# Patient Record
Sex: Female | Born: 1935 | Race: White | Hispanic: No | Smoking: Never smoker
Health system: Southern US, Community
[De-identification: ages and names within clinical notes are randomized; demographics above are authoritative.]

## PROBLEM LIST (undated history)

## (undated) DIAGNOSIS — I340 Nonrheumatic mitral (valve) insufficiency: Secondary | ICD-10-CM

## (undated) DIAGNOSIS — J45909 Unspecified asthma, uncomplicated: Secondary | ICD-10-CM

## (undated) DIAGNOSIS — E119 Type 2 diabetes mellitus without complications: Secondary | ICD-10-CM

## (undated) DIAGNOSIS — I4891 Unspecified atrial fibrillation: Secondary | ICD-10-CM

## (undated) DIAGNOSIS — J189 Pneumonia, unspecified organism: Secondary | ICD-10-CM

## (undated) DIAGNOSIS — K922 Gastrointestinal hemorrhage, unspecified: Secondary | ICD-10-CM

## (undated) DIAGNOSIS — D126 Benign neoplasm of colon, unspecified: Secondary | ICD-10-CM

## (undated) DIAGNOSIS — D649 Anemia, unspecified: Secondary | ICD-10-CM

## (undated) DIAGNOSIS — I5032 Chronic diastolic (congestive) heart failure: Secondary | ICD-10-CM

## (undated) DIAGNOSIS — J309 Allergic rhinitis, unspecified: Secondary | ICD-10-CM

## (undated) DIAGNOSIS — M1990 Unspecified osteoarthritis, unspecified site: Secondary | ICD-10-CM

## (undated) DIAGNOSIS — I2510 Atherosclerotic heart disease of native coronary artery without angina pectoris: Secondary | ICD-10-CM

## (undated) DIAGNOSIS — I10 Essential (primary) hypertension: Secondary | ICD-10-CM

## (undated) DIAGNOSIS — E785 Hyperlipidemia, unspecified: Secondary | ICD-10-CM

## (undated) DIAGNOSIS — N200 Calculus of kidney: Secondary | ICD-10-CM

## (undated) HISTORY — DX: Anemia, unspecified: D64.9

## (undated) HISTORY — DX: Type 2 diabetes mellitus without complications: E11.9

## (undated) HISTORY — PX: BREAST CYST ASPIRATION: SHX578

## (undated) HISTORY — DX: Unspecified atrial fibrillation: I48.91

## (undated) HISTORY — DX: Essential (primary) hypertension: I10

## (undated) HISTORY — DX: Allergic rhinitis, unspecified: J30.9

## (undated) HISTORY — DX: Hyperlipidemia, unspecified: E78.5

## (undated) HISTORY — DX: Benign neoplasm of colon, unspecified: D12.6

## (undated) HISTORY — DX: Unspecified asthma, uncomplicated: J45.909

## (undated) HISTORY — DX: Calculus of kidney: N20.0

## (undated) HISTORY — DX: Atherosclerotic heart disease of native coronary artery without angina pectoris: I25.10

## (undated) HISTORY — DX: Pneumonia, unspecified organism: J18.9

## (undated) HISTORY — DX: Unspecified osteoarthritis, unspecified site: M19.90

---

## 1988-08-10 HISTORY — PX: ABDOMINAL HYSTERECTOMY: SHX81

## 1997-12-25 ENCOUNTER — Ambulatory Visit (HOSPITAL_COMMUNITY): Admission: RE | Admit: 1997-12-25 | Discharge: 1997-12-25 | Payer: Self-pay | Admitting: Internal Medicine

## 1998-04-30 ENCOUNTER — Other Ambulatory Visit: Admission: RE | Admit: 1998-04-30 | Discharge: 1998-04-30 | Payer: Self-pay | Admitting: *Deleted

## 1998-05-07 ENCOUNTER — Inpatient Hospital Stay (HOSPITAL_COMMUNITY): Admission: RE | Admit: 1998-05-07 | Discharge: 1998-05-10 | Payer: Self-pay | Admitting: *Deleted

## 1999-02-18 ENCOUNTER — Ambulatory Visit (HOSPITAL_COMMUNITY): Admission: RE | Admit: 1999-02-18 | Discharge: 1999-02-18 | Payer: Self-pay | Admitting: Internal Medicine

## 1999-02-18 ENCOUNTER — Encounter: Payer: Self-pay | Admitting: Internal Medicine

## 1999-02-25 ENCOUNTER — Ambulatory Visit (HOSPITAL_COMMUNITY): Admission: RE | Admit: 1999-02-25 | Discharge: 1999-02-25 | Payer: Self-pay | Admitting: Internal Medicine

## 1999-02-25 ENCOUNTER — Encounter: Payer: Self-pay | Admitting: Internal Medicine

## 2000-03-23 ENCOUNTER — Encounter: Payer: Self-pay | Admitting: Internal Medicine

## 2000-03-23 ENCOUNTER — Ambulatory Visit (HOSPITAL_COMMUNITY): Admission: RE | Admit: 2000-03-23 | Discharge: 2000-03-23 | Payer: Self-pay | Admitting: Internal Medicine

## 2000-03-25 ENCOUNTER — Encounter: Admission: RE | Admit: 2000-03-25 | Discharge: 2000-03-25 | Payer: Self-pay | Admitting: Internal Medicine

## 2000-03-25 ENCOUNTER — Encounter: Payer: Self-pay | Admitting: Internal Medicine

## 2001-05-03 ENCOUNTER — Ambulatory Visit (HOSPITAL_COMMUNITY): Admission: RE | Admit: 2001-05-03 | Discharge: 2001-05-03 | Payer: Self-pay | Admitting: Cardiology

## 2001-06-13 ENCOUNTER — Encounter: Payer: Self-pay | Admitting: Cardiology

## 2001-06-13 ENCOUNTER — Ambulatory Visit (HOSPITAL_COMMUNITY): Admission: RE | Admit: 2001-06-13 | Discharge: 2001-06-13 | Payer: Self-pay | Admitting: Cardiology

## 2002-12-14 ENCOUNTER — Encounter: Payer: Self-pay | Admitting: Internal Medicine

## 2002-12-14 ENCOUNTER — Ambulatory Visit (HOSPITAL_COMMUNITY): Admission: RE | Admit: 2002-12-14 | Discharge: 2002-12-14 | Payer: Self-pay | Admitting: Internal Medicine

## 2003-03-04 ENCOUNTER — Emergency Department (HOSPITAL_COMMUNITY): Admission: EM | Admit: 2003-03-04 | Discharge: 2003-03-04 | Payer: Self-pay | Admitting: Emergency Medicine

## 2004-09-13 ENCOUNTER — Inpatient Hospital Stay (HOSPITAL_COMMUNITY): Admission: EM | Admit: 2004-09-13 | Discharge: 2004-09-16 | Payer: Self-pay | Admitting: Emergency Medicine

## 2005-08-10 HISTORY — PX: LUMBAR FUSION: SHX111

## 2005-11-04 ENCOUNTER — Encounter: Admission: RE | Admit: 2005-11-04 | Discharge: 2005-11-04 | Payer: Self-pay | Admitting: Internal Medicine

## 2005-11-20 ENCOUNTER — Ambulatory Visit (HOSPITAL_COMMUNITY): Admission: RE | Admit: 2005-11-20 | Discharge: 2005-11-20 | Payer: Self-pay | Admitting: Internal Medicine

## 2006-01-05 ENCOUNTER — Encounter: Admission: RE | Admit: 2006-01-05 | Discharge: 2006-01-05 | Payer: Self-pay | Admitting: Orthopedic Surgery

## 2006-03-03 ENCOUNTER — Encounter: Admission: RE | Admit: 2006-03-03 | Discharge: 2006-03-03 | Payer: Self-pay | Admitting: Internal Medicine

## 2006-04-19 ENCOUNTER — Encounter: Admission: RE | Admit: 2006-04-19 | Discharge: 2006-04-19 | Payer: Self-pay | Admitting: Neurosurgery

## 2006-05-26 ENCOUNTER — Inpatient Hospital Stay (HOSPITAL_COMMUNITY): Admission: RE | Admit: 2006-05-26 | Discharge: 2006-06-02 | Payer: Self-pay | Admitting: Neurosurgery

## 2006-05-26 ENCOUNTER — Encounter (INDEPENDENT_AMBULATORY_CARE_PROVIDER_SITE_OTHER): Payer: Self-pay | Admitting: Specialist

## 2006-05-27 ENCOUNTER — Ambulatory Visit: Payer: Self-pay | Admitting: Physical Medicine & Rehabilitation

## 2006-07-29 ENCOUNTER — Encounter: Admission: RE | Admit: 2006-07-29 | Discharge: 2006-08-25 | Payer: Self-pay | Admitting: Internal Medicine

## 2007-01-09 HISTORY — PX: TOTAL HIP ARTHROPLASTY: SHX124

## 2007-01-17 ENCOUNTER — Inpatient Hospital Stay (HOSPITAL_COMMUNITY): Admission: RE | Admit: 2007-01-17 | Discharge: 2007-01-22 | Payer: Self-pay | Admitting: Orthopedic Surgery

## 2007-03-23 ENCOUNTER — Encounter: Admission: RE | Admit: 2007-03-23 | Discharge: 2007-03-23 | Payer: Self-pay | Admitting: Orthopedic Surgery

## 2007-05-09 ENCOUNTER — Encounter: Admission: RE | Admit: 2007-05-09 | Discharge: 2007-05-09 | Payer: Self-pay | Admitting: Internal Medicine

## 2007-07-01 ENCOUNTER — Encounter: Admission: RE | Admit: 2007-07-01 | Discharge: 2007-07-01 | Payer: Self-pay | Admitting: Orthopedic Surgery

## 2007-11-07 ENCOUNTER — Ambulatory Visit: Payer: Self-pay | Admitting: Internal Medicine

## 2007-11-07 ENCOUNTER — Inpatient Hospital Stay (HOSPITAL_COMMUNITY): Admission: EM | Admit: 2007-11-07 | Discharge: 2007-11-24 | Payer: Self-pay | Admitting: Emergency Medicine

## 2007-11-07 ENCOUNTER — Ambulatory Visit: Payer: Self-pay | Admitting: Oncology

## 2007-11-09 ENCOUNTER — Encounter (INDEPENDENT_AMBULATORY_CARE_PROVIDER_SITE_OTHER): Payer: Self-pay | Admitting: *Deleted

## 2007-11-09 ENCOUNTER — Encounter: Payer: Self-pay | Admitting: Cardiology

## 2007-11-15 ENCOUNTER — Encounter (INDEPENDENT_AMBULATORY_CARE_PROVIDER_SITE_OTHER): Payer: Self-pay | Admitting: *Deleted

## 2007-11-15 ENCOUNTER — Ambulatory Visit: Payer: Self-pay | Admitting: Thoracic Surgery (Cardiothoracic Vascular Surgery)

## 2007-11-24 ENCOUNTER — Encounter (INDEPENDENT_AMBULATORY_CARE_PROVIDER_SITE_OTHER): Payer: Self-pay | Admitting: *Deleted

## 2008-02-03 ENCOUNTER — Ambulatory Visit: Payer: Self-pay | Admitting: Internal Medicine

## 2008-02-03 DIAGNOSIS — I509 Heart failure, unspecified: Secondary | ICD-10-CM | POA: Insufficient documentation

## 2008-02-03 DIAGNOSIS — E785 Hyperlipidemia, unspecified: Secondary | ICD-10-CM

## 2008-02-03 DIAGNOSIS — M1990 Unspecified osteoarthritis, unspecified site: Secondary | ICD-10-CM | POA: Insufficient documentation

## 2008-02-03 DIAGNOSIS — J309 Allergic rhinitis, unspecified: Secondary | ICD-10-CM | POA: Insufficient documentation

## 2008-02-03 DIAGNOSIS — I10 Essential (primary) hypertension: Secondary | ICD-10-CM | POA: Insufficient documentation

## 2008-02-03 DIAGNOSIS — Z8709 Personal history of other diseases of the respiratory system: Secondary | ICD-10-CM | POA: Insufficient documentation

## 2008-02-03 DIAGNOSIS — I2510 Atherosclerotic heart disease of native coronary artery without angina pectoris: Secondary | ICD-10-CM | POA: Insufficient documentation

## 2008-02-03 DIAGNOSIS — I4891 Unspecified atrial fibrillation: Secondary | ICD-10-CM | POA: Insufficient documentation

## 2008-02-03 DIAGNOSIS — D649 Anemia, unspecified: Secondary | ICD-10-CM

## 2008-02-03 DIAGNOSIS — J45909 Unspecified asthma, uncomplicated: Secondary | ICD-10-CM | POA: Insufficient documentation

## 2008-02-03 DIAGNOSIS — E119 Type 2 diabetes mellitus without complications: Secondary | ICD-10-CM | POA: Insufficient documentation

## 2008-02-03 DIAGNOSIS — Z87442 Personal history of urinary calculi: Secondary | ICD-10-CM

## 2008-02-07 ENCOUNTER — Ambulatory Visit: Payer: Self-pay | Admitting: Cardiology

## 2008-02-28 ENCOUNTER — Ambulatory Visit: Payer: Self-pay | Admitting: Cardiology

## 2008-03-06 ENCOUNTER — Ambulatory Visit: Payer: Self-pay | Admitting: Internal Medicine

## 2008-03-06 LAB — CONVERTED CEMR LAB
BUN: 15 mg/dL (ref 6–23)
Basophils Relative: 0.3 % (ref 0.0–3.0)
CO2: 31 meq/L (ref 19–32)
Chloride: 97 meq/L (ref 96–112)
Creatinine, Ser: 0.6 mg/dL (ref 0.4–1.2)
Eosinophils Relative: 1.8 % (ref 0.0–5.0)
Glucose, Bld: 159 mg/dL — ABNORMAL HIGH (ref 70–99)
HCT: 28.7 % — ABNORMAL LOW (ref 36.0–46.0)
Hemoglobin: 9.3 g/dL — ABNORMAL LOW (ref 12.0–15.0)
Hgb A1c MFr Bld: 7 % — ABNORMAL HIGH (ref 4.6–6.0)
Monocytes Absolute: 0.7 10*3/uL (ref 0.1–1.0)
Monocytes Relative: 11.5 % (ref 3.0–12.0)
Neutro Abs: 4.2 10*3/uL (ref 1.4–7.7)
RDW: 17.7 % — ABNORMAL HIGH (ref 11.5–14.6)
Retic Ct Pct: 1.4 % (ref 0.4–3.1)

## 2008-03-08 ENCOUNTER — Telehealth: Payer: Self-pay | Admitting: Internal Medicine

## 2008-03-09 ENCOUNTER — Encounter: Payer: Self-pay | Admitting: Internal Medicine

## 2008-03-09 ENCOUNTER — Telehealth: Payer: Self-pay | Admitting: Internal Medicine

## 2008-03-27 ENCOUNTER — Encounter: Admission: RE | Admit: 2008-03-27 | Discharge: 2008-03-27 | Payer: Self-pay | Admitting: Orthopedic Surgery

## 2008-03-29 ENCOUNTER — Ambulatory Visit: Payer: Self-pay | Admitting: Cardiovascular Disease

## 2008-03-29 ENCOUNTER — Ambulatory Visit: Payer: Self-pay | Admitting: Internal Medicine

## 2008-04-03 ENCOUNTER — Ambulatory Visit: Payer: Self-pay | Admitting: Cardiology

## 2008-04-17 ENCOUNTER — Ambulatory Visit: Payer: Self-pay | Admitting: Cardiology

## 2008-05-08 ENCOUNTER — Ambulatory Visit: Payer: Self-pay | Admitting: Cardiology

## 2008-06-05 ENCOUNTER — Ambulatory Visit: Payer: Self-pay | Admitting: Cardiology

## 2008-06-27 ENCOUNTER — Ambulatory Visit: Payer: Self-pay | Admitting: Cardiology

## 2008-07-18 ENCOUNTER — Ambulatory Visit: Payer: Self-pay | Admitting: Internal Medicine

## 2008-08-08 ENCOUNTER — Ambulatory Visit: Payer: Self-pay | Admitting: Internal Medicine

## 2008-08-10 HISTORY — PX: INGUINAL HERNIA REPAIR: SUR1180

## 2008-08-15 ENCOUNTER — Telehealth: Payer: Self-pay | Admitting: Internal Medicine

## 2008-09-05 ENCOUNTER — Ambulatory Visit: Payer: Self-pay | Admitting: Cardiology

## 2008-09-05 ENCOUNTER — Ambulatory Visit: Payer: Self-pay

## 2008-09-06 ENCOUNTER — Ambulatory Visit: Payer: Self-pay | Admitting: Cardiovascular Disease

## 2008-09-06 LAB — CONVERTED CEMR LAB
Basophils Relative: 0.2 % (ref 0.0–3.0)
CO2: 32 meq/L (ref 19–32)
Calcium: 9.3 mg/dL (ref 8.4–10.5)
Chloride: 104 meq/L (ref 96–112)
Creatinine, Ser: 0.8 mg/dL (ref 0.4–1.2)
Glucose, Bld: 108 mg/dL — ABNORMAL HIGH (ref 70–99)
Hemoglobin: 8.7 g/dL — ABNORMAL LOW (ref 12.0–15.0)
Lymphocytes Relative: 19.2 % (ref 12.0–46.0)
Monocytes Relative: 10.3 % (ref 3.0–12.0)
Neutro Abs: 2.6 10*3/uL (ref 1.4–7.7)
Neutrophils Relative %: 68.5 % (ref 43.0–77.0)
RBC: 2.98 M/uL — ABNORMAL LOW (ref 3.87–5.11)
Sodium: 143 meq/L (ref 135–145)

## 2008-09-13 ENCOUNTER — Ambulatory Visit: Payer: Self-pay | Admitting: Internal Medicine

## 2008-09-14 LAB — CONVERTED CEMR LAB
BUN: 30 mg/dL — ABNORMAL HIGH (ref 6–23)
Calcium: 10.7 mg/dL — ABNORMAL HIGH (ref 8.4–10.5)
Creatinine, Ser: 0.8 mg/dL (ref 0.4–1.2)
Eosinophils Absolute: 0.1 10*3/uL (ref 0.0–0.7)
Eosinophils Relative: 2.2 % (ref 0.0–5.0)
GFR calc Af Amer: 87 mL/min
GFR calc non Af Amer: 72 mL/min
Glucose, Bld: 118 mg/dL — ABNORMAL HIGH (ref 70–99)
HCT: 29.5 % — ABNORMAL LOW (ref 36.0–46.0)
Hemoglobin: 9.7 g/dL — ABNORMAL LOW (ref 12.0–15.0)
MCV: 87.7 fL (ref 78.0–100.0)
Monocytes Absolute: 0.6 10*3/uL (ref 0.1–1.0)
Monocytes Relative: 11 % (ref 3.0–12.0)
Neutro Abs: 3.6 10*3/uL (ref 1.4–7.7)
Platelets: 160 10*3/uL (ref 150–400)
Potassium: 4 meq/L (ref 3.5–5.1)
WBC: 5.3 10*3/uL (ref 4.5–10.5)

## 2008-09-27 DIAGNOSIS — I080 Rheumatic disorders of both mitral and aortic valves: Secondary | ICD-10-CM

## 2008-09-28 ENCOUNTER — Ambulatory Visit: Payer: Self-pay | Admitting: Cardiovascular Disease

## 2008-11-27 ENCOUNTER — Ambulatory Visit: Payer: Self-pay | Admitting: Cardiovascular Disease

## 2008-11-27 ENCOUNTER — Encounter: Payer: Self-pay | Admitting: Cardiovascular Disease

## 2008-12-31 ENCOUNTER — Telehealth: Payer: Self-pay | Admitting: Cardiovascular Disease

## 2009-01-08 ENCOUNTER — Encounter: Payer: Self-pay | Admitting: *Deleted

## 2009-02-13 ENCOUNTER — Encounter: Payer: Self-pay | Admitting: *Deleted

## 2009-02-13 ENCOUNTER — Telehealth: Payer: Self-pay | Admitting: Internal Medicine

## 2009-02-28 ENCOUNTER — Telehealth: Payer: Self-pay | Admitting: Internal Medicine

## 2009-03-19 ENCOUNTER — Encounter: Payer: Self-pay | Admitting: Cardiology

## 2009-04-02 ENCOUNTER — Encounter: Admission: RE | Admit: 2009-04-02 | Discharge: 2009-04-02 | Payer: Self-pay | Admitting: Orthopedic Surgery

## 2009-04-18 ENCOUNTER — Encounter (INDEPENDENT_AMBULATORY_CARE_PROVIDER_SITE_OTHER): Payer: Self-pay | Admitting: *Deleted

## 2009-04-23 ENCOUNTER — Ambulatory Visit: Payer: Self-pay | Admitting: Internal Medicine

## 2009-04-23 ENCOUNTER — Ambulatory Visit: Payer: Self-pay | Admitting: Cardiology

## 2009-04-23 ENCOUNTER — Inpatient Hospital Stay (HOSPITAL_COMMUNITY): Admission: EM | Admit: 2009-04-23 | Discharge: 2009-05-06 | Payer: Self-pay | Admitting: Emergency Medicine

## 2009-04-26 ENCOUNTER — Encounter (INDEPENDENT_AMBULATORY_CARE_PROVIDER_SITE_OTHER): Payer: Self-pay | Admitting: General Surgery

## 2009-04-26 ENCOUNTER — Encounter (INDEPENDENT_AMBULATORY_CARE_PROVIDER_SITE_OTHER): Payer: Self-pay | Admitting: *Deleted

## 2009-05-03 ENCOUNTER — Ambulatory Visit: Payer: Self-pay | Admitting: Physical Medicine & Rehabilitation

## 2009-05-06 ENCOUNTER — Inpatient Hospital Stay (HOSPITAL_COMMUNITY)
Admission: RE | Admit: 2009-05-06 | Discharge: 2009-05-13 | Payer: Self-pay | Admitting: Physical Medicine & Rehabilitation

## 2009-05-06 ENCOUNTER — Ambulatory Visit: Payer: Self-pay | Admitting: Physical Medicine & Rehabilitation

## 2009-05-13 ENCOUNTER — Encounter: Payer: Self-pay | Admitting: Internal Medicine

## 2009-05-15 ENCOUNTER — Encounter: Payer: Self-pay | Admitting: Internal Medicine

## 2009-05-16 ENCOUNTER — Telehealth: Payer: Self-pay | Admitting: Internal Medicine

## 2009-05-23 ENCOUNTER — Telehealth: Payer: Self-pay | Admitting: Internal Medicine

## 2009-05-26 ENCOUNTER — Encounter: Payer: Self-pay | Admitting: Internal Medicine

## 2009-05-28 ENCOUNTER — Ambulatory Visit: Payer: Self-pay | Admitting: Internal Medicine

## 2009-05-28 DIAGNOSIS — Z9889 Other specified postprocedural states: Secondary | ICD-10-CM

## 2009-05-28 LAB — CONVERTED CEMR LAB
BUN: 36 mg/dL — ABNORMAL HIGH (ref 6–23)
Basophils Absolute: 0 10*3/uL (ref 0.0–0.1)
Calcium: 9.3 mg/dL (ref 8.4–10.5)
Creatinine, Ser: 1.1 mg/dL (ref 0.4–1.2)
Eosinophils Relative: 2 % (ref 0.0–5.0)
GFR calc non Af Amer: 49.93 mL/min (ref >60)
Glucose, Bld: 116 mg/dL — ABNORMAL HIGH (ref 70–99)
HCT: 30.1 % — ABNORMAL LOW (ref 36.0–46.0)
Iron: 58 ug/dL (ref 42–145)
Lymphs Abs: 2.1 10*3/uL (ref 0.7–4.0)
Monocytes Relative: 8.4 % (ref 3.0–12.0)
Neutrophils Relative %: 69.5 % (ref 43.0–77.0)
Platelets: 144 10*3/uL — ABNORMAL LOW (ref 150.0–400.0)
Potassium: 3.1 meq/L — ABNORMAL LOW (ref 3.5–5.1)
RDW: 18.2 % — ABNORMAL HIGH (ref 11.5–14.6)
WBC: 10.6 10*3/uL — ABNORMAL HIGH (ref 4.5–10.5)

## 2009-05-30 ENCOUNTER — Telehealth (INDEPENDENT_AMBULATORY_CARE_PROVIDER_SITE_OTHER): Payer: Self-pay | Admitting: *Deleted

## 2009-06-19 ENCOUNTER — Encounter: Admission: RE | Admit: 2009-06-19 | Discharge: 2009-06-19 | Payer: Self-pay | Admitting: Orthopedic Surgery

## 2009-06-24 ENCOUNTER — Telehealth: Payer: Self-pay | Admitting: Internal Medicine

## 2009-06-26 ENCOUNTER — Telehealth: Payer: Self-pay | Admitting: Internal Medicine

## 2009-06-27 ENCOUNTER — Ambulatory Visit: Payer: Self-pay | Admitting: Internal Medicine

## 2009-08-02 ENCOUNTER — Ambulatory Visit: Payer: Self-pay | Admitting: Family Medicine

## 2009-08-02 DIAGNOSIS — J019 Acute sinusitis, unspecified: Secondary | ICD-10-CM

## 2009-08-06 ENCOUNTER — Telehealth: Payer: Self-pay | Admitting: Internal Medicine

## 2009-08-12 ENCOUNTER — Telehealth: Payer: Self-pay | Admitting: Internal Medicine

## 2009-08-12 ENCOUNTER — Ambulatory Visit: Payer: Self-pay | Admitting: Internal Medicine

## 2009-08-12 DIAGNOSIS — R197 Diarrhea, unspecified: Secondary | ICD-10-CM

## 2009-08-13 ENCOUNTER — Ambulatory Visit: Payer: Self-pay | Admitting: Internal Medicine

## 2009-08-15 ENCOUNTER — Telehealth: Payer: Self-pay | Admitting: Internal Medicine

## 2009-09-01 ENCOUNTER — Inpatient Hospital Stay (HOSPITAL_COMMUNITY): Admission: EM | Admit: 2009-09-01 | Discharge: 2009-09-04 | Payer: Self-pay | Admitting: Emergency Medicine

## 2009-09-01 ENCOUNTER — Ambulatory Visit: Payer: Self-pay | Admitting: Cardiovascular Disease

## 2009-09-01 ENCOUNTER — Ambulatory Visit: Payer: Self-pay | Admitting: Internal Medicine

## 2009-09-02 ENCOUNTER — Encounter: Payer: Self-pay | Admitting: Internal Medicine

## 2009-09-02 ENCOUNTER — Telehealth: Payer: Self-pay | Admitting: Internal Medicine

## 2009-09-03 ENCOUNTER — Telehealth: Payer: Self-pay | Admitting: Internal Medicine

## 2009-09-04 ENCOUNTER — Telehealth (INDEPENDENT_AMBULATORY_CARE_PROVIDER_SITE_OTHER): Payer: Self-pay | Admitting: *Deleted

## 2009-09-04 ENCOUNTER — Telehealth: Payer: Self-pay | Admitting: Internal Medicine

## 2009-09-05 ENCOUNTER — Telehealth: Payer: Self-pay | Admitting: Internal Medicine

## 2009-09-10 ENCOUNTER — Ambulatory Visit: Payer: Self-pay | Admitting: Internal Medicine

## 2009-09-10 ENCOUNTER — Telehealth: Payer: Self-pay | Admitting: Internal Medicine

## 2009-09-10 LAB — CONVERTED CEMR LAB
Basophils Absolute: 0.1 10*3/uL (ref 0.0–0.1)
Calcium: 10.1 mg/dL (ref 8.4–10.5)
Creatinine, Ser: 0.8 mg/dL (ref 0.4–1.2)
Eosinophils Relative: 2.5 % (ref 0.0–5.0)
HCT: 34.5 % — ABNORMAL LOW (ref 36.0–46.0)
Hemoglobin: 11 g/dL — ABNORMAL LOW (ref 12.0–15.0)
Iron: 43 ug/dL (ref 42–145)
Lymphocytes Relative: 18.6 % (ref 12.0–46.0)
Monocytes Relative: 10.6 % (ref 3.0–12.0)
Neutro Abs: 4.5 10*3/uL (ref 1.4–7.7)
Platelets: 169 10*3/uL (ref 150.0–400.0)
Pro B Natriuretic peptide (BNP): 312 pg/mL — ABNORMAL HIGH (ref 0.0–100.0)
RDW: 19 % — ABNORMAL HIGH (ref 11.5–14.6)
Saturation Ratios: 8.5 % — ABNORMAL LOW (ref 20.0–50.0)
Sodium: 145 meq/L (ref 135–145)
WBC: 6.8 10*3/uL (ref 4.5–10.5)

## 2009-09-18 ENCOUNTER — Ambulatory Visit: Payer: Self-pay | Admitting: Internal Medicine

## 2009-09-26 ENCOUNTER — Telehealth: Payer: Self-pay | Admitting: Internal Medicine

## 2009-10-03 ENCOUNTER — Telehealth: Payer: Self-pay | Admitting: Internal Medicine

## 2009-10-05 ENCOUNTER — Ambulatory Visit: Payer: Self-pay | Admitting: Internal Medicine

## 2009-10-05 ENCOUNTER — Inpatient Hospital Stay (HOSPITAL_COMMUNITY): Admission: EM | Admit: 2009-10-05 | Discharge: 2009-10-15 | Payer: Self-pay | Admitting: Emergency Medicine

## 2009-10-05 ENCOUNTER — Ambulatory Visit: Payer: Self-pay | Admitting: Cardiovascular Disease

## 2009-10-08 ENCOUNTER — Ambulatory Visit: Payer: Self-pay | Admitting: Gastroenterology

## 2009-10-08 ENCOUNTER — Encounter: Payer: Self-pay | Admitting: Internal Medicine

## 2009-10-08 ENCOUNTER — Telehealth: Payer: Self-pay | Admitting: Internal Medicine

## 2009-10-08 DIAGNOSIS — D126 Benign neoplasm of colon, unspecified: Secondary | ICD-10-CM

## 2009-10-08 HISTORY — DX: Benign neoplasm of colon, unspecified: D12.6

## 2009-10-11 ENCOUNTER — Encounter: Payer: Self-pay | Admitting: Gastroenterology

## 2009-10-11 ENCOUNTER — Encounter: Payer: Self-pay | Admitting: Internal Medicine

## 2009-10-11 LAB — HM COLONOSCOPY

## 2009-10-14 ENCOUNTER — Encounter: Payer: Self-pay | Admitting: Gastroenterology

## 2009-10-22 ENCOUNTER — Encounter: Payer: Self-pay | Admitting: Internal Medicine

## 2009-10-25 ENCOUNTER — Ambulatory Visit: Payer: Self-pay | Admitting: Internal Medicine

## 2009-10-25 LAB — CONVERTED CEMR LAB
Basophils Relative: 0.1 % (ref 0.0–3.0)
CO2: 34 meq/L — ABNORMAL HIGH (ref 19–32)
Chloride: 101 meq/L (ref 96–112)
Eosinophils Relative: 2.4 % (ref 0.0–5.0)
GFR calc non Af Amer: 55.68 mL/min (ref >60)
Glucose, Bld: 125 mg/dL — ABNORMAL HIGH (ref 70–99)
Hemoglobin: 9.6 g/dL — ABNORMAL LOW (ref 12.0–15.0)
Lymphocytes Relative: 15.7 % (ref 12.0–46.0)
Monocytes Relative: 12.8 % — ABNORMAL HIGH (ref 3.0–12.0)
Neutro Abs: 2.5 10*3/uL (ref 1.4–7.7)
Neutrophils Relative %: 69 % (ref 43.0–77.0)
Potassium: 3.6 meq/L (ref 3.5–5.1)
RBC: 3.37 M/uL — ABNORMAL LOW (ref 3.87–5.11)
Sodium: 142 meq/L (ref 135–145)
WBC: 3.5 10*3/uL — ABNORMAL LOW (ref 4.5–10.5)

## 2009-10-27 ENCOUNTER — Telehealth: Payer: Self-pay | Admitting: Internal Medicine

## 2009-10-28 ENCOUNTER — Telehealth: Payer: Self-pay | Admitting: Internal Medicine

## 2009-10-29 ENCOUNTER — Ambulatory Visit: Payer: Self-pay | Admitting: Internal Medicine

## 2009-10-29 ENCOUNTER — Encounter: Payer: Self-pay | Admitting: Internal Medicine

## 2009-11-05 ENCOUNTER — Telehealth: Payer: Self-pay | Admitting: Internal Medicine

## 2009-11-14 ENCOUNTER — Encounter: Admission: RE | Admit: 2009-11-14 | Discharge: 2009-11-14 | Payer: Self-pay | Admitting: Orthopedic Surgery

## 2009-11-19 ENCOUNTER — Encounter: Payer: Self-pay | Admitting: Internal Medicine

## 2009-11-19 LAB — CBC & DIFF AND RETIC
BASO%: 0.2 % (ref 0.0–2.0)
HCT: 31.7 % — ABNORMAL LOW (ref 34.8–46.6)
Immature Retic Fract: 14.7 % — ABNORMAL HIGH (ref 0.00–10.70)
LYMPH%: 18.3 % (ref 14.0–49.7)
MCH: 28.8 pg (ref 25.1–34.0)
MCHC: 30.6 g/dL — ABNORMAL LOW (ref 31.5–36.0)
MONO#: 0.6 10*3/uL (ref 0.1–0.9)
NEUT%: 70.7 % (ref 38.4–76.8)
Platelets: 172 10*3/uL (ref 145–400)
WBC: 6.2 10*3/uL (ref 3.9–10.3)

## 2009-11-21 LAB — ERYTHROPOIETIN: Erythropoietin: 39.3 m[IU]/mL — ABNORMAL HIGH (ref 2.6–34.0)

## 2009-11-21 LAB — PROTEIN ELECTROPHORESIS, SERUM
Albumin ELP: 57.5 % (ref 55.8–66.1)
Alpha-1-Globulin: 5.6 % — ABNORMAL HIGH (ref 2.9–4.9)
Total Protein, Serum Electrophoresis: 7 g/dL (ref 6.0–8.3)

## 2009-11-21 LAB — VITAMIN B12: Vitamin B-12: 800 pg/mL (ref 211–911)

## 2009-11-21 LAB — COMPREHENSIVE METABOLIC PANEL
ALT: 24 U/L (ref 0–35)
Albumin: 4.4 g/dL (ref 3.5–5.2)
CO2: 27 mEq/L (ref 19–32)
Calcium: 10 mg/dL (ref 8.4–10.5)
Chloride: 100 mEq/L (ref 96–112)
Creatinine, Ser: 0.97 mg/dL (ref 0.40–1.20)
Total Protein: 7 g/dL (ref 6.0–8.3)

## 2009-11-21 LAB — LACTATE DEHYDROGENASE: LDH: 161 U/L (ref 94–250)

## 2009-11-21 LAB — IRON AND TIBC: TIBC: 447 ug/dL (ref 250–470)

## 2009-11-22 ENCOUNTER — Ambulatory Visit: Payer: Self-pay | Admitting: Internal Medicine

## 2009-11-27 ENCOUNTER — Encounter: Payer: Self-pay | Admitting: Internal Medicine

## 2009-11-27 LAB — CBC WITH DIFFERENTIAL/PLATELET
BASO%: 0.4 % (ref 0.0–2.0)
Basophils Absolute: 0 10*3/uL (ref 0.0–0.1)
HCT: 31.9 % — ABNORMAL LOW (ref 34.8–46.6)
HGB: 9.9 g/dL — ABNORMAL LOW (ref 11.6–15.9)
MONO#: 0.4 10*3/uL (ref 0.1–0.9)
NEUT%: 70.1 % (ref 38.4–76.8)
RDW: 17 % — ABNORMAL HIGH (ref 11.2–14.5)
WBC: 5.6 10*3/uL (ref 3.9–10.3)
lymph#: 1 10*3/uL (ref 0.9–3.3)

## 2009-12-05 ENCOUNTER — Ambulatory Visit: Payer: Self-pay | Admitting: Internal Medicine

## 2009-12-25 ENCOUNTER — Encounter: Admission: RE | Admit: 2009-12-25 | Discharge: 2009-12-25 | Payer: Self-pay | Admitting: Orthopedic Surgery

## 2010-02-05 ENCOUNTER — Telehealth: Payer: Self-pay | Admitting: Internal Medicine

## 2010-02-11 ENCOUNTER — Telehealth: Payer: Self-pay | Admitting: Internal Medicine

## 2010-02-13 ENCOUNTER — Inpatient Hospital Stay (HOSPITAL_COMMUNITY): Admission: AD | Admit: 2010-02-13 | Discharge: 2010-02-15 | Payer: Self-pay | Admitting: Internal Medicine

## 2010-02-13 ENCOUNTER — Ambulatory Visit: Payer: Self-pay | Admitting: Internal Medicine

## 2010-02-13 LAB — CONVERTED CEMR LAB
Basophils Absolute: 0 10*3/uL (ref 0.0–0.1)
HCT: 20.6 % — CL (ref 36.0–46.0)
Hemoglobin: 6.8 g/dL — CL (ref 12.0–15.0)
Lymphs Abs: 0.9 10*3/uL (ref 0.7–4.0)
MCHC: 32.9 g/dL (ref 30.0–36.0)
MCV: 93.2 fL (ref 78.0–100.0)
Monocytes Absolute: 0.6 10*3/uL (ref 0.1–1.0)
Monocytes Relative: 10.8 % (ref 3.0–12.0)
Neutro Abs: 4 10*3/uL (ref 1.4–7.7)
RDW: 18.3 % — ABNORMAL HIGH (ref 11.5–14.6)

## 2010-02-15 ENCOUNTER — Encounter (INDEPENDENT_AMBULATORY_CARE_PROVIDER_SITE_OTHER): Payer: Self-pay | Admitting: *Deleted

## 2010-02-17 ENCOUNTER — Encounter: Payer: Self-pay | Admitting: Internal Medicine

## 2010-02-19 ENCOUNTER — Telehealth: Payer: Self-pay | Admitting: Internal Medicine

## 2010-02-19 ENCOUNTER — Ambulatory Visit: Payer: Self-pay | Admitting: Internal Medicine

## 2010-02-19 LAB — IRON AND TIBC
Iron: 29 ug/dL — ABNORMAL LOW (ref 42–145)
UIBC: 387 ug/dL

## 2010-02-19 LAB — CBC WITH DIFFERENTIAL/PLATELET
BASO%: 0.4 % (ref 0.0–2.0)
EOS%: 2.3 % (ref 0.0–7.0)
MCH: 29.6 pg (ref 25.1–34.0)
MCHC: 31.7 g/dL (ref 31.5–36.0)
MCV: 93.4 fL (ref 79.5–101.0)
MONO%: 9.4 % (ref 0.0–14.0)
RBC: 2.93 10*6/uL — ABNORMAL LOW (ref 3.70–5.45)
RDW: 20 % — ABNORMAL HIGH (ref 11.2–14.5)

## 2010-02-20 ENCOUNTER — Telehealth: Payer: Self-pay | Admitting: Internal Medicine

## 2010-02-26 ENCOUNTER — Encounter: Payer: Self-pay | Admitting: Internal Medicine

## 2010-02-27 ENCOUNTER — Encounter: Payer: Self-pay | Admitting: Internal Medicine

## 2010-02-27 ENCOUNTER — Ambulatory Visit: Payer: Self-pay | Admitting: Internal Medicine

## 2010-03-04 ENCOUNTER — Ambulatory Visit: Payer: Self-pay | Admitting: Internal Medicine

## 2010-03-05 ENCOUNTER — Encounter: Payer: Self-pay | Admitting: Internal Medicine

## 2010-03-11 ENCOUNTER — Encounter: Payer: Self-pay | Admitting: Internal Medicine

## 2010-03-12 ENCOUNTER — Telehealth: Payer: Self-pay | Admitting: Internal Medicine

## 2010-03-20 ENCOUNTER — Ambulatory Visit: Payer: Self-pay | Admitting: Internal Medicine

## 2010-03-20 LAB — CONVERTED CEMR LAB
Basophils Absolute: 0 10*3/uL (ref 0.0–0.1)
Calcium: 10 mg/dL (ref 8.4–10.5)
GFR calc non Af Amer: 65.33 mL/min (ref >60)
HCT: 36.2 % (ref 36.0–46.0)
Lymphs Abs: 0.9 10*3/uL (ref 0.7–4.0)
MCV: 92 fL (ref 78.0–100.0)
Monocytes Absolute: 0.6 10*3/uL (ref 0.1–1.0)
Monocytes Relative: 10.3 % (ref 3.0–12.0)
Platelets: 145 10*3/uL — ABNORMAL LOW (ref 150.0–400.0)
RDW: 19.1 % — ABNORMAL HIGH (ref 11.5–14.6)
Sodium: 146 meq/L — ABNORMAL HIGH (ref 135–145)

## 2010-03-21 ENCOUNTER — Telehealth: Payer: Self-pay | Admitting: Internal Medicine

## 2010-03-26 ENCOUNTER — Encounter: Payer: Self-pay | Admitting: Internal Medicine

## 2010-04-08 ENCOUNTER — Ambulatory Visit: Payer: Self-pay | Admitting: Cardiovascular Disease

## 2010-04-09 ENCOUNTER — Encounter: Admission: RE | Admit: 2010-04-09 | Discharge: 2010-04-09 | Payer: Self-pay | Admitting: Orthopedic Surgery

## 2010-04-17 ENCOUNTER — Ambulatory Visit: Payer: Self-pay | Admitting: Internal Medicine

## 2010-04-22 LAB — CBC & DIFF AND RETIC
Basophils Absolute: 0 10*3/uL (ref 0.0–0.1)
EOS%: 1 % (ref 0.0–7.0)
HGB: 11.6 g/dL (ref 11.6–15.9)
MCH: 29.3 pg (ref 25.1–34.0)
MCHC: 31.9 g/dL (ref 31.5–36.0)
MCV: 91.9 fL (ref 79.5–101.0)
MONO%: 7.4 % (ref 0.0–14.0)
NEUT%: 72.8 % (ref 38.4–76.8)
RDW: 16.2 % — ABNORMAL HIGH (ref 11.2–14.5)

## 2010-04-23 LAB — FOLATE: Folate: >20 ng/mL

## 2010-04-23 LAB — IRON AND TIBC
%SAT: 20 % (ref 20–55)
TIBC: 342 ug/dL (ref 250–470)

## 2010-04-23 LAB — VITAMIN B12: Vitamin B-12: 918 pg/mL — ABNORMAL HIGH (ref 211–911)

## 2010-04-29 ENCOUNTER — Encounter: Payer: Self-pay | Admitting: Internal Medicine

## 2010-05-08 ENCOUNTER — Telehealth: Payer: Self-pay | Admitting: Internal Medicine

## 2010-05-08 ENCOUNTER — Encounter: Payer: Self-pay | Admitting: Internal Medicine

## 2010-05-13 ENCOUNTER — Ambulatory Visit: Payer: Self-pay | Admitting: Internal Medicine

## 2010-05-13 DIAGNOSIS — M79609 Pain in unspecified limb: Secondary | ICD-10-CM

## 2010-05-13 DIAGNOSIS — R413 Other amnesia: Secondary | ICD-10-CM

## 2010-05-23 ENCOUNTER — Ambulatory Visit: Payer: Self-pay | Admitting: Internal Medicine

## 2010-05-28 ENCOUNTER — Encounter: Payer: Self-pay | Admitting: Internal Medicine

## 2010-06-18 ENCOUNTER — Telehealth: Payer: Self-pay | Admitting: Internal Medicine

## 2010-07-15 ENCOUNTER — Ambulatory Visit: Payer: Self-pay | Admitting: Internal Medicine

## 2010-07-15 DIAGNOSIS — J069 Acute upper respiratory infection, unspecified: Secondary | ICD-10-CM | POA: Insufficient documentation

## 2010-07-17 ENCOUNTER — Ambulatory Visit: Payer: Self-pay | Admitting: Internal Medicine

## 2010-07-21 ENCOUNTER — Telehealth: Payer: Self-pay | Admitting: Internal Medicine

## 2010-07-24 LAB — CBC WITH DIFFERENTIAL/PLATELET
Basophils Absolute: 0 10*3/uL (ref 0.0–0.1)
Eosinophils Absolute: 0.1 10*3/uL (ref 0.0–0.5)
HGB: 10.6 g/dL — ABNORMAL LOW (ref 11.6–15.9)
MCV: 97.5 fL (ref 79.5–101.0)
MONO#: 0.4 10*3/uL (ref 0.1–0.9)
MONO%: 6.4 % (ref 0.0–14.0)
NEUT#: 4.3 10*3/uL (ref 1.5–6.5)
Platelets: 133 10*3/uL — ABNORMAL LOW (ref 145–400)
RDW: 16 % — ABNORMAL HIGH (ref 11.2–14.5)
WBC: 5.5 10*3/uL (ref 3.9–10.3)

## 2010-07-24 LAB — IRON AND TIBC
%SAT: 12 % — ABNORMAL LOW (ref 20–55)
Iron: 44 ug/dL (ref 42–145)
UIBC: 320 ug/dL

## 2010-07-24 LAB — FERRITIN: Ferritin: 95 ng/mL (ref 10–291)

## 2010-07-25 ENCOUNTER — Telehealth: Payer: Self-pay | Admitting: Internal Medicine

## 2010-07-28 ENCOUNTER — Telehealth: Payer: Self-pay | Admitting: Internal Medicine

## 2010-07-29 ENCOUNTER — Ambulatory Visit: Payer: Self-pay | Admitting: Internal Medicine

## 2010-07-29 LAB — CONVERTED CEMR LAB
Basophils Absolute: 0 10*3/uL (ref 0.0–0.1)
Calcium: 9.7 mg/dL (ref 8.4–10.5)
Eosinophils Relative: 1.6 % (ref 0.0–5.0)
GFR calc non Af Amer: 57.57 mL/min — ABNORMAL LOW (ref >60.00)
HCT: 31.6 % — ABNORMAL LOW (ref 36.0–46.0)
Lymphs Abs: 0.5 10*3/uL — ABNORMAL LOW (ref 0.7–4.0)
MCV: 98.7 fL (ref 78.0–100.0)
Monocytes Absolute: 0.4 10*3/uL (ref 0.1–1.0)
Neutrophils Relative %: 81.9 % — ABNORMAL HIGH (ref 43.0–77.0)
Platelets: 134 10*3/uL — ABNORMAL LOW (ref 150.0–400.0)
Potassium: 3.2 meq/L — ABNORMAL LOW (ref 3.5–5.1)
Pro B Natriuretic peptide (BNP): 535.2 pg/mL — ABNORMAL HIGH (ref 0.0–100.0)
RDW: 16 % — ABNORMAL HIGH (ref 11.5–14.6)
Sodium: 140 meq/L (ref 135–145)
WBC: 5.6 10*3/uL (ref 4.5–10.5)

## 2010-08-18 ENCOUNTER — Ambulatory Visit: Payer: Self-pay | Admitting: Internal Medicine

## 2010-08-20 ENCOUNTER — Encounter: Payer: Self-pay | Admitting: Internal Medicine

## 2010-08-20 LAB — CBC WITH DIFFERENTIAL/PLATELET
BASO%: 0.1 % (ref 0.0–2.0)
Basophils Absolute: 0 10*3/uL (ref 0.0–0.1)
EOS%: 2.7 % (ref 0.0–7.0)
Eosinophils Absolute: 0.2 10*3/uL (ref 0.0–0.5)
HCT: 32.4 % — ABNORMAL LOW (ref 34.8–46.6)
HGB: 10.7 g/dL — ABNORMAL LOW (ref 11.6–15.9)
LYMPH%: 15.7 % (ref 14.0–49.7)
MCH: 31.3 pg (ref 25.1–34.0)
MCHC: 33 g/dL (ref 31.5–36.0)
MCV: 95 fL (ref 79.5–101.0)
MONO#: 0.6 10*3/uL (ref 0.1–0.9)
MONO%: 8.9 % (ref 0.0–14.0)
NEUT#: 4.8 10*3/uL (ref 1.5–6.5)
NEUT%: 72.6 % (ref 38.4–76.8)
Platelets: 142 10*3/uL — ABNORMAL LOW (ref 145–400)
RBC: 3.41 10*6/uL — ABNORMAL LOW (ref 3.70–5.45)
RDW: 15.5 % — ABNORMAL HIGH (ref 11.2–14.5)
WBC: 6.6 10*3/uL (ref 3.9–10.3)
lymph#: 1 10*3/uL (ref 0.9–3.3)

## 2010-09-04 ENCOUNTER — Encounter: Payer: Self-pay | Admitting: Cardiovascular Disease

## 2010-09-04 ENCOUNTER — Ambulatory Visit
Admission: RE | Admit: 2010-09-04 | Discharge: 2010-09-04 | Payer: Self-pay | Source: Home / Self Care | Attending: Cardiovascular Disease | Admitting: Cardiovascular Disease

## 2010-09-05 ENCOUNTER — Other Ambulatory Visit: Payer: Self-pay | Admitting: Internal Medicine

## 2010-09-05 ENCOUNTER — Ambulatory Visit
Admission: RE | Admit: 2010-09-05 | Discharge: 2010-09-05 | Payer: Self-pay | Source: Home / Self Care | Attending: Internal Medicine | Admitting: Internal Medicine

## 2010-09-05 ENCOUNTER — Inpatient Hospital Stay (HOSPITAL_COMMUNITY)
Admit: 2010-09-05 | Discharge: 2010-09-09 | Disposition: A | Discharge status: Home / Self Care | Payer: Medicare Other | Attending: Internal Medicine | Admitting: Internal Medicine

## 2010-09-05 LAB — CBC WITH DIFFERENTIAL/PLATELET
Basophils Relative: 0.3 % (ref 0.0–3.0)
Eosinophils Absolute: 0.1 10*3/uL (ref 0.0–0.7)
HCT: 17.4 % — CL (ref 36.0–46.0)
Lymphs Abs: 1.4 10*3/uL (ref 0.7–4.0)
MCHC: 33 g/dL (ref 30.0–36.0)
MCV: 97.1 fl (ref 78.0–100.0)
Monocytes Absolute: 0.8 10*3/uL (ref 0.1–1.0)
Neutrophils Relative %: 67.7 % (ref 43.0–77.0)
RBC: 1.79 Mil/uL — ABNORMAL LOW (ref 3.87–5.11)

## 2010-09-05 LAB — BASIC METABOLIC PANEL
BUN: 66 mg/dL — ABNORMAL HIGH (ref 6–23)
CO2: 34 mEq/L — ABNORMAL HIGH (ref 19–32)
Calcium: 9.2 mg/dL (ref 8.4–10.5)
Creatinine, Ser: 1.1 mg/dL (ref 0.4–1.2)
GFR: 51.96 mL/min — ABNORMAL LOW (ref >60.00)
Glucose, Bld: 144 mg/dL — ABNORMAL HIGH (ref 70–99)
Sodium: 142 mEq/L (ref 135–145)

## 2010-09-05 LAB — ABO/RH: ABO/RH(D): A POS

## 2010-09-06 DIAGNOSIS — K2971 Gastritis, unspecified, with bleeding: Secondary | ICD-10-CM

## 2010-09-06 DIAGNOSIS — K2991 Gastroduodenitis, unspecified, with bleeding: Secondary | ICD-10-CM

## 2010-09-06 DIAGNOSIS — I359 Nonrheumatic aortic valve disorder, unspecified: Secondary | ICD-10-CM

## 2010-09-06 DIAGNOSIS — I059 Rheumatic mitral valve disease, unspecified: Secondary | ICD-10-CM

## 2010-09-06 DIAGNOSIS — D500 Iron deficiency anemia secondary to blood loss (chronic): Secondary | ICD-10-CM

## 2010-09-06 LAB — GLUCOSE, CAPILLARY
Glucose-Capillary: 144 mg/dL — ABNORMAL HIGH (ref 70–99)
Glucose-Capillary: 161 mg/dL — ABNORMAL HIGH (ref 70–99)

## 2010-09-07 LAB — GLUCOSE, CAPILLARY: Glucose-Capillary: 192 mg/dL — ABNORMAL HIGH (ref 70–99)

## 2010-09-07 LAB — HEMOGLOBIN AND HEMATOCRIT, BLOOD: HCT: 23.8 % — ABNORMAL LOW (ref 36.0–46.0)

## 2010-09-08 LAB — GLUCOSE, CAPILLARY: Glucose-Capillary: 164 mg/dL — ABNORMAL HIGH (ref 70–99)

## 2010-09-09 LAB — GLUCOSE, CAPILLARY
Glucose-Capillary: 156 mg/dL — ABNORMAL HIGH (ref 70–99)
Glucose-Capillary: 195 mg/dL — ABNORMAL HIGH (ref 70–99)

## 2010-09-09 LAB — CROSSMATCH
ABO/RH(D): A POS
Antibody Screen: NEGATIVE
Unit division: 0
Unit division: 0

## 2010-09-09 LAB — HEMOGLOBIN AND HEMATOCRIT, BLOOD
HCT: 33.9 % — ABNORMAL LOW (ref 36.0–46.0)
Hemoglobin: 11.2 g/dL — ABNORMAL LOW (ref 12.0–15.0)

## 2010-09-11 ENCOUNTER — Ambulatory Visit: Payer: Medicare Other | Admitting: Internal Medicine

## 2010-09-11 ENCOUNTER — Encounter (INDEPENDENT_AMBULATORY_CARE_PROVIDER_SITE_OTHER): Payer: Self-pay | Admitting: *Deleted

## 2010-09-11 ENCOUNTER — Telehealth: Payer: Self-pay | Admitting: Internal Medicine

## 2010-09-11 ENCOUNTER — Other Ambulatory Visit: Payer: Medicare Other

## 2010-09-11 ENCOUNTER — Other Ambulatory Visit: Payer: Self-pay | Admitting: Internal Medicine

## 2010-09-11 ENCOUNTER — Encounter: Payer: Self-pay | Admitting: Internal Medicine

## 2010-09-11 DIAGNOSIS — K2901 Acute gastritis with bleeding: Secondary | ICD-10-CM

## 2010-09-11 LAB — CBC WITH DIFFERENTIAL/PLATELET
Basophils Relative: 0.3 % (ref 0.0–3.0)
Eosinophils Absolute: 0.3 10*3/uL (ref 0.0–0.7)
Eosinophils Relative: 5.2 % — ABNORMAL HIGH (ref 0.0–5.0)
Hemoglobin: 10.8 g/dL — ABNORMAL LOW (ref 12.0–15.0)
Lymphocytes Relative: 14.6 % (ref 12.0–46.0)
Monocytes Relative: 8.1 % (ref 3.0–12.0)
Neutro Abs: 4.8 10*3/uL (ref 1.4–7.7)
Neutrophils Relative %: 71.8 % (ref 43.0–77.0)
RBC: 3.46 Mil/uL — ABNORMAL LOW (ref 3.87–5.11)
WBC: 6.7 10*3/uL (ref 4.5–10.5)

## 2010-09-11 NOTE — Letter (Signed)
Summary: Patient Notice- Polyp Results  [facility]  [address] [place], [place] [address]   Phone: [phone]  Fax: [phone]        October 14, 2009 MRN: [id number]    ROCKELL FAULKS [address] [place], [place]  [address]    Dear Ms. Neyland,  I am pleased to inform you that the colon polyp(s) removed during your recent colonoscopy was (were) found to be benign (no cancer detected) upon pathologic examination.  Should you develop new or worsening symptoms of abdominal pain, bowel habit changes or bleeding from the rectum or bowels, please schedule an evaluation with either your primary care physician or with me.  No further action with gastroenterology is needed at this time. Please      follow-up with your primary care physician for your other healthcare      needs.  Continue treatment plan as outlined the day of your exam.  Please call us if you are having persistent problems or have questions about your condition that have not been fully answered at this time.  Sincerely,  Meryl Dare MD [facility]  This letter has been electronically signed by your physician.  Appended Document: Patient Notice- Polyp Results letter mailed to patient's home

## 2010-09-11 NOTE — Letter (Signed)
Summary: MMSE  MMSE   Imported By: Lester [place] 05/16/2010 10:31:41  _____________________________________________________________________  External Attachment:    Type:   Image     Comment:   External Document

## 2010-09-11 NOTE — Assessment & Plan Note (Signed)
Summary: DISCH'D WK B4 LAST-ANKLES SWOLLEN--[facility] NURSE/Y[facility]C   Vital Signs:  Patient profile:   77 year old female Height:      61 inches Weight:      133 pounds O2 Sat:      93 % on Room air Temp:     97.0 degrees F oral Pulse rate:   63 / minute BP sitting:   118 / 66  (left arm) Cuff size:   regular  Vitals Entered By: Bill Salinas CMA (October 25, 2009 9:14 AM)  O2 Flow:  Room air CC: pt here for follow up visit after being out of hospital. Pt c/o redness and swelling around her ankles/ ab   Primary Care Provider:  Jathniel Smeltzer  CC:  pt here for follow up visit after being out of hospital. Pt c/o redness and swelling around her ankles/ ab.  History of Present Illness: Patient presents for hospital f/u. all records reviewed. she was admitted with a acute anemia with blood loss from the lower extremity . She did require transfusion. during her stay she did have colonoscopy with removal of two adenomatous polyps. Her hemoglobin did remain stable after transfusion.   since d/c/ March 8th she has done pretty well with the main problem is peripheral edema. Her son has increased her lasix to treat this. Discussed the difference between venous insufficiency and diastolic heart failure. Emphasized the importance of daily weights and the "rule of 2's."  Current Medications (verified): 1)  Ferrous Gluconate 325 Mg Tabs (Ferrous Gluconate) .Marland Kitchen.. 1 Two Times A Day 2)  Metoprolol Tartrate 50 Mg Tabs (Metoprolol Tartrate) .Marland Kitchen.. 1 Tab Two Times A Day 3)  Lanoxin 0.125 Mg  Tabs (Digoxin) .... Take 1 Tablet By Mouth Once A Day 4)  Furosemide 40 Mg  Tabs (Furosemide) .... Take 1 Tablet By Mouth Two Times A Day 5)  Amlodipine Besylate 10 Mg  Tabs (Amlodipine Besylate) .... Take 1 Tablet By Mouth Once A Day 6)  Metformin Hcl 500 Mg  Tb24 (Metformin Hcl) .... Take 1 Tablet By Mouth Once A Day 7)  Avapro 300 Mg Tabs (Irbesartan) .Marland Kitchen.. 1 Once Daily 8)  Fexofenadine Hcl 180 Mg Tabs (Fexofenadine Hcl) ....  As Needed 9)  Simvastatin 20 Mg  Tabs (Simvastatin) .... Take 1 Tablet By Mouth Once A Day 10)  Doxazosin Mesylate 8 Mg  Tabs (Doxazosin Mesylate) .... Take 1 Tab By Mouth At Bedtime 11)  Trazodone Hcl 50 Mg Tabs (Trazodone Hcl) .Marland Kitchen.. 1 To 2 Tabs At Bedtime As Needed 12)  Clonazepam 0.5 Mg Tabs (Clonazepam) .Marland Kitchen.. 1 Tab Two Times A Day 13)  Metronidazole 500 Mg Tabs (Metronidazole) .Marland Kitchen.. 1 By Mouth Three Times A Day X 10days For Possible C. Diff. 14)  Requip 0.5 Mg Tabs (Ropinirole Hcl) .... 1/2 At Bedtime X 6 Days Then 1 Tab At Bedtime X 10 Days Then Contact Office With Update 15)  Temazepam 15 Mg Caps (Temazepam) .Marland Kitchen.. 1 At Bedtime 16)  Hydrochlorothiazide 25 Mg Tabs (Hydrochlorothiazide) .Marland Kitchen.. 1 Once Daily  Allergies (verified): 1)  ! Codeine  Past History:  Past Medical History: Last updated: 05/28/2009 INGUINAL HERNIORRHAPHY, RIGHT, HX OF (ICD-V45.89) MITRAL VALVE INSUFF&AORTIC VALVE INSUFF (ICD-396.3) HYPERLIPIDEMIA-MIXED (ICD-272.4) OTHER AND UNSPECIFIED HYPERLIPIDEMIA (ICD-272.4) PNEUMONIA, HX OF (ICD-V12.60) OSTEOARTHRITIS (ICD-715.90) NEPHROLITHIASIS, HX OF (ICD-V13.01) HYPERTENSION (ICD-401.9) DIABETES MELLITUS, TYPE II (ICD-250.00) CORONARY ARTERY DISEASE (ICD-414.00) CONGESTIVE HEART FAILURE (ICD-428.0) ATRIAL FIBRILLATION (ICD-427.31) ASTHMA (ICD-493.90) ANEMIA-NOS (ICD-285.9) ALLERGIC RHINITIS (ICD-477.9)  Past Surgical History: Last updated: 05/28/2009 breast cysts w/ aspiration  x 50 Hysterectomy 1990 Lumbar fusion'07 Lovell Sheehan) Total hip replacement-right Valentina Gu) June 2008 Right inquinal hernia repair '10 ([facility])  Family History: Last updated: 01-May-2009 Father:decease CHF Mother:decease pneumonia  Siblings:1 brother good health  Social History: Last updated: 09/27/2008 married '42 -38 years-widowed 1 son -'8, 1 daughter '3 Lives alone - I-ADLs, including driving End-of-Life issues: no prolonged mechanical vent. support. Laymen's Guide to Death  With Dignity provided with a suggestion that she discuss these issues with her family. Her son was supportive of having this discussion. 4 grandchildren Tobacco Use - No.  Alcohol Use - no Regular Exercise - no  Risk Factors: Caffeine Use: 1 (02/03/2008) Exercise: no (02/03/2008)  Risk Factors: Smoking Status: never (09/27/2008) Passive Smoke Exposure: no (02/03/2008)  Review of Systems       The patient complains of peripheral edema and difficulty walking.  The patient denies anorexia, weight gain, chest pain, dyspnea on exertion, headaches, abdominal pain, severe indigestion/heartburn, incontinence, abnormal bleeding, and enlarged lymph nodes.    Physical Exam  General:  elderly white female who is well groomed and wigged. Head:  normocephalic and atraumatic.   Eyes:  pupils equal, pupils round, corneas and lenses clear, and no injection.   Neck:  supple.   Lungs:  normal respiratory effort and normal breath sounds.   Heart:  normal rate and regular rhythm.  II/VI murmur Abdomen:  soft, non-tender, and normal bowel sounds.   Msk:  no joint tenderness, no joint swelling, no joint warmth, and no joint instability.   Pulses:  2+ radial Extremities:  2+ LE edema to mid-calve bilaterally Neurologic:  alert & oriented X3 and cranial nerves II-XII intact.   Skin:  turgor normal, color normal, no suspicious lesions, and no ulcerations.   Psych:  Oriented X3, normally interactive, and good eye contact.     Impression & Recommendations:  Problem # 1:  HYPERTENSION (ICD-401.9)  Her updated medication list for this problem includes:    Metoprolol Tartrate 50 Mg Tabs (Metoprolol tartrate) .Marland Kitchen... 1 tab two times a day    Furosemide 40 Mg Tabs (Furosemide) .Marland Kitchen... Take 1 tablet by mouth two times a day    Amlodipine Besylate 10 Mg Tabs (Amlodipine besylate) .Marland Kitchen... Take 1 tablet by mouth once a day    Avapro 300 Mg Tabs (Irbesartan) .Marland Kitchen... 1 once daily    Doxazosin Mesylate 8 Mg Tabs  (Doxazosin mesylate) .Marland Kitchen... Take 1 tab by mouth at bedtime    Hydrochlorothiazide 25 Mg Tabs (Hydrochlorothiazide) .Marland Kitchen... 1 once daily  BP today: 118/66 Prior BP: 152/72 (09/10/2009)  Labs Reviewed: K+: 4.1 (09/10/2009) Creat: : 0.8 (09/10/2009)   Good control on present regimen    Problem # 2:  DIABETES MELLITUS, TYPE II (ICD-250.00) Lat A1C 7.2% 09/01/09  Plan - continue metformin and diet  Her updated medication list for this problem includes:    Metformin Hcl 500 Mg Tb24 (Metformin hcl) .Marland Kitchen... Take 1 tablet by mouth once a day    Avapro 300 Mg Tabs (Irbesartan) .Marland Kitchen... 1 once daily  Problem # 3:  HYPERLIPIDEMIA-MIXED (ICD-272.4) Last lipid panel Jan 24th 2011  Chol 84, HDL 28, LDL 39  Plan - contine simvastatin but discuss discontinuation at next visit.   Her updated medication list for this problem includes:    Simvastatin 20 Mg Tabs (Simvastatin) .Marland Kitchen... Take 1 tablet by mouth once a day  Problem # 4:  CONGESTIVE HEART FAILURE (ICD-428.0) Patient with no overt signs of failure except for mild peripheral edema which is  most likely multi-factorial. Reviewed recent labs:                1/24     2/26    3/18        BNP        593     269     412  Plan - continue present dose of furosemide           follow the "rle of 2's"  Her updated medication list for this problem includes:    Metoprolol Tartrate 50 Mg Tabs (Metoprolol tartrate) .Marland Kitchen... 1 tab two times a day    Lanoxin 0.125 Mg Tabs (Digoxin) .Marland Kitchen... Take 1 tablet by mouth once a day    Furosemide 40 Mg Tabs (Furosemide) .Marland Kitchen... Take 1 tablet by mouth two times a day    Avapro 300 Mg Tabs (Irbesartan) .Marland Kitchen... 1 once daily    Hydrochlorothiazide 25 Mg Tabs (Hydrochlorothiazide) .Marland Kitchen... 1 once daily  Orders: TLB-BNP (B-Natriuretic Peptide) (83880-BNPR) TLB-BMP (Basic Metabolic Panel-BMET) (80048-METABOL)  Problem # 5:  ANEMIA-NOS (ICD-285.9) Patient did require transfusion at last hospitalization. She did have several polyps which  may have been a contributor to her anemia. Labs reviewed: Jan 23rd: retic 1.5%, Absolute count 56.3, Iron 22, % Fe sat 5%  B12 797. At discharge her hemoglobin was approx 9.5g.  Plan - follow-up Hgb           refer for re-evaluation with Dr. Arbutus Ped  Her updated medication list for this problem includes:    Ferrous Gluconate 325 Mg Tabs (Ferrous gluconate) .Marland Kitchen... 1 two times a day  Orders: TLB-CBC Platelet - w/Differential (85025-CBCD) Hematology Referral (Hematology)  Addendum - Hgb 9.6  Complete Medication List: 1)  Ferrous Gluconate 325 Mg Tabs (Ferrous gluconate) .Marland Kitchen.. 1 two times a day 2)  Metoprolol Tartrate 50 Mg Tabs (Metoprolol tartrate) .Marland Kitchen.. 1 tab two times a day 3)  Lanoxin 0.125 Mg Tabs (Digoxin) .... Take 1 tablet by mouth once a day 4)  Furosemide 40 Mg Tabs (Furosemide) .... Take 1 tablet by mouth two times a day 5)  Amlodipine Besylate 10 Mg Tabs (Amlodipine besylate) .... Take 1 tablet by mouth once a day 6)  Metformin Hcl 500 Mg Tb24 (Metformin hcl) .... Take 1 tablet by mouth once a day 7)  Avapro 300 Mg Tabs (Irbesartan) .Marland Kitchen.. 1 once daily 8)  Fexofenadine Hcl 180 Mg Tabs (Fexofenadine hcl) .... As needed 9)  Simvastatin 20 Mg Tabs (Simvastatin) .... Take 1 tablet by mouth once a day 10)  Doxazosin Mesylate 8 Mg Tabs (Doxazosin mesylate) .... Take 1 tab by mouth at bedtime 11)  Trazodone Hcl 50 Mg Tabs (Trazodone hcl) .Marland Kitchen.. 1 to 2 tabs at bedtime as needed 12)  Clonazepam 0.5 Mg Tabs (Clonazepam) .Marland Kitchen.. 1 tab two times a day 13)  Metronidazole 500 Mg Tabs (Metronidazole) .Marland Kitchen.. 1 by mouth three times a day x 10days for possible c. diff. 14)  Requip 0.5 Mg Tabs (Ropinirole hcl) .... 1/2 at bedtime x 6 days then 1 tab at bedtime x 10 days then contact office with update 15)  Temazepam 15 Mg Caps (Temazepam) .Marland Kitchen.. 1 at bedtime 16)  Hydrochlorothiazide 25 Mg Tabs (Hydrochlorothiazide) .Marland Kitchen.. 1 once daily  Patient Instructions: 1)  Anemia - plan is to get lab today to monitor  blood count. Will refer you to Dr. Arbutus Ped who saw you in hospital in '09 - for evaluation of chronic anemia. 2)  Heart failure - due to diastolic dysfucntion  and valvular disease: follow the "rule of 2's," weigh daily. If you gain 2+ pounds in 2 days double the lasix for two days. 3)  Swelling in the legs - may be venous insufficiency. Plan - wear knee high support hose from the time you get up until bedtime.

## 2010-09-11 NOTE — Progress Notes (Signed)
Summary: HOME HEALTH  Phone Note From Other Clinic   Caller: Therapist [phone] Summary of Call: Physical therapist is req verbal order to continue services. OK?  Initial call taken by: Lamar Sprinkles, CMA,  November 05, 2009 11:54 AM  Follow-up for Phone Call        ok to continue PT Follow-up by: Jacques Navy MD,  November 05, 2009 12:49 PM  Additional Follow-up for Phone Call Additional follow up Details #1::        Left vm with verbal ok Additional Follow-up by: Lamar Sprinkles, CMA,  November 05, 2009 2:19 PM

## 2010-09-11 NOTE — Progress Notes (Signed)
Summary: OV TODAY / DIARRHEA  Phone Note Call from Patient   Caller: Inis Sizer [phone] Summary of Call: Pt's son called. Pt c/o chills, body aches, diarrhea and some nausea which began last night. Would you work pt in today? If yes, would you like to put her in the "flu" room.  Initial call taken by: Lamar Sprinkles, CMA,  August 12, 2009 3:17 PM  Follow-up for Phone Call        [facility] work in per Dr, pt & son informed, no flu rm needed Follow-up by: Lamar Sprinkles, CMA,  August 12, 2009 3:28 PM

## 2010-09-11 NOTE — Miscellaneous (Signed)
Summary: PT Order/[facility]  PT Order/[facility]   Imported By: Sherian Rein 03/03/2010 12:04:58  _____________________________________________________________________  External Attachment:    Type:   Image     Comment:   External Document

## 2010-09-11 NOTE — Progress Notes (Signed)
Summary: [facility]  Phone Note Refill Request Message from:  Fax from Pharmacy on September 26, 2009 10:48 AM  Refills Requested: Medication #1:  AVALIDE 300-25 MG  TABS Take 1 tablet by mouth once a day Pharmacy is asking if they can change patient to Avapro 300 mg and HCTZ 25 mg. Please advise.  Initial call taken by: Lucious Groves,  September 26, 2009 10:49 AM  Follow-up for Phone Call        yes, that is perfect Follow-up by: Jacques Navy MD,  September 26, 2009 1:16 PM    New/Updated Medications: AVAPRO 300 MG TABS (IRBESARTAN) 1 once daily HYDROCHLOROTHIAZIDE 25 MG TABS (HYDROCHLOROTHIAZIDE) 1 once daily Prescriptions: HYDROCHLOROTHIAZIDE 25 MG TABS (HYDROCHLOROTHIAZIDE) 1 once daily  #90 x 1   Entered by:   Lamar Sprinkles, CMA   Authorized by:   Jacques Navy MD   Signed by:   Lamar Sprinkles, CMA on 09/26/2009   Method used:   Electronically to        [facility]  [phone]* (retail)       [address]       [place], [place]  [address]       Ph: [phone] or [phone]       Fax: [phone]   RxID:   [id number] AVAPRO 300 MG TABS (IRBESARTAN) 1 once daily  #90 x 1   Entered by:   Lamar Sprinkles, CMA   Authorized by:   Jacques Navy MD   Signed by:   Lamar Sprinkles, CMA on 09/26/2009   Method used:   Electronically to        [facility]  [phone]* (retail)       [address]       [place], [place]  [address]       Ph: [phone] or [phone]       Fax: [phone]   RxID:   [id number]

## 2010-09-11 NOTE — Assessment & Plan Note (Signed)
Summary: discuss memory/SD   Vital Signs:  Patient profile:   77 year old female Height:      64 inches Weight:      121 pounds BMI:     20.84 O2 Sat:      95 % on Room air Temp:     97.4 degrees F oral Pulse rate:   59 / minute BP sitting:   134 / 60  (left arm) Cuff size:   regular  Vitals Entered By: Bill Salinas CMA (May 13, 2010 2:22 PM)  O2 Flow:  Room air CC: ov for evaluation of short term memory loss. She also has c/o pain and throbbing in toes in her right foot x 6 months/ ab   Primary Care Debria Broecker:  [facility]  CC:  ov for evaluation of short term memory loss. She also has c/o pain and throbbing in toes in her right foot x 6 months/ ab.  History of Present Illness: Patient presents for medical folllow-up.   She c/o pain in the 2nd toe right foot for several months. She does have arthiritis left hip and this alters her gait.   Her daughter is present and states the family is concerned about memory loss.   She has otherwise been doing well. No bleeding problems, no chest pain, no increased weakness.  Current Medications (verified): 1)  Ferrous Gluconate 325 Mg Tabs (Ferrous Gluconate) .Marland Kitchen.. 1 Two Times A Day 2)  Metoprolol Tartrate 50 Mg Tabs (Metoprolol Tartrate) .Marland Kitchen.. 1 Tab Two Times A Day 3)  Lanoxin 0.125 Mg  Tabs (Digoxin) .... Take 1 Tablet By Mouth Once A Day 4)  Furosemide 40 Mg  Tabs (Furosemide) .... Take 1 Tablet By Mouth Two Times A Day 5)  Metformin Hcl 500 Mg  Tb24 (Metformin Hcl) .... Take 1 Tablet By Mouth Once A Day 6)  Avapro 300 Mg Tabs (Irbesartan) .Marland Kitchen.. 1 Once Daily 7)  Fexofenadine Hcl 180 Mg Tabs (Fexofenadine Hcl) .... As Needed 8)  Simvastatin 20 Mg  Tabs (Simvastatin) .... Take 1 Tablet By Mouth Once A Day 9)  Doxazosin Mesylate 4 Mg Tabs (Doxazosin Mesylate) .Marland Kitchen.. 1 By Mouth At Bedtime For Blood Pressure 10)  Trazodone Hcl 50 Mg Tabs (Trazodone Hcl) .Marland Kitchen.. 1 To 2 Tabs At Bedtime As Needed 11)  Clonazepam 0.5 Mg Tabs (Clonazepam) .Marland Kitchen.. 1 Tab  Two Times A Day 12)  Temazepam 15 Mg Caps (Temazepam) .Marland Kitchen.. 1 At Bedtime 13)  K-Phos 500 Mg Tabs (Potassium Phosphate Monobasic) .... 2 in The Morning and 1 At Bedtime 14)  Metolazone 2.5 Mg Tabs (Metolazone) .Marland Kitchen.. 1 Tab M-W-F 30 Minutes Before Am Furosemide.  Allergies (verified): 1)  ! Codeine  Past History:  Past Medical History: Last updated: 02/13/2010 INGUINAL HERNIORRHAPHY, RIGHT, HX OF (ICD-V45.89) MITRAL VALVE INSUFF&AORTIC VALVE INSUFF (ICD-396.3) HYPERLIPIDEMIA-MIXED (ICD-272.4) OTHER AND UNSPECIFIED HYPERLIPIDEMIA (ICD-272.4) PNEUMONIA, HX OF (ICD-V12.60) OSTEOARTHRITIS (ICD-715.90) NEPHROLITHIASIS, HX OF (ICD-V13.01) HYPERTENSION (ICD-401.9) DIABETES MELLITUS, TYPE II (ICD-250.00) CORONARY ARTERY DISEASE (ICD-414.00) CONGESTIVE HEART FAILURE (ICD-428.0) ATRIAL FIBRILLATION (ICD-427.31) ASTHMA (ICD-493.90) ANEMIA-NOS (ICD-285.9) ALLERGIC RHINITIS (ICD-477.9)  Past Surgical History: Last updated: 05/28/2009 breast cysts w/ aspiration x 50 Hysterectomy 1990 Lumbar fusion'07 Lovell Sheehan) Total hip replacement-right Valentina Gu) June 2008 Right inquinal hernia repair '10 ([facility])  Family History: Last updated: 2009/05/04 Father:decease CHF Mother:decease pneumonia  Siblings:1 brother good health  Social History: Last updated: 09/27/2008 married '42 -38 years-widowed 1 son -'59, 1 daughter '62 Lives alone - I-ADLs, including driving End-of-Life issues: no prolonged mechanical vent. support. Laymen's Guide  to Death With Dignity provided with a suggestion that she discuss these issues with her family. Her son was supportive of having this discussion. 4 grandchildren Tobacco Use - No.  Alcohol Use - no Regular Exercise - no  Review of Systems  The patient denies anorexia, weight loss, decreased hearing, chest pain, dyspnea on exertion, abdominal pain, melena, hematochezia, muscle weakness, suspicious skin lesions, abnormal bleeding, and enlarged lymph nodes.     Physical Exam  General:  elderly white woman, wel coiffed and in no disress. Head:  normocephalic and atraumatic.   Eyes:  pupils equal and pupils round.  C&S clear Lungs:  normal respiratory effort and normal breath sounds.   Heart:  normal rate and regular rhythm.   Abdomen:  non-tender and normal bowel sounds.   Msk:  normal ROM.   Pulses:  2+ radial Neurologic:  MMSE 1) day - no, month - no, year - ok 2) pres - yes, sen - no, content - no 3) 5 fwd - 1 error, 5 fwd - yes, world - ok 4) serial 7's - ok, ponderous. nickles - no; change - no 5) 0/3 6) naming objects 7) parables - discouraged 8) judgement - mail 9)clock face - fluid and accurate, signature good Skin:  turgor normal and color normal.  Mild onychyomycosis but the nail of the 2nd toe right is sharply angled and raised. Tender to pressure against the nail. There is a developing callus on the medial side 2nd toe from overlap with great toe. Psych:  good eye contact.     Impression & Recommendations:  Problem # 1:  FOOT PAIN, RIGHT (ICD-729.5) Pain seems to be due to onychyomycosis and malformation of the nail 2nd toe right. Also callus may cause some pain.  Plan - podiatry consult for nail trimming or avulsion.           toe spacer between 1st and 2nd toes.  Orders: Podiatry Referral (Podiatry)  Problem # 2:  HYPERTENSION (ICD-401.9)  Her updated medication list for this problem includes:    Metoprolol Tartrate 50 Mg Tabs (Metoprolol tartrate) .Marland Kitchen... 1 tab two times a day    Furosemide 40 Mg Tabs (Furosemide) .Marland Kitchen... Take 1 tablet by mouth two times a day    Avapro 300 Mg Tabs (Irbesartan) .Marland Kitchen... 1 once daily    Doxazosin Mesylate 4 Mg Tabs (Doxazosin mesylate) .Marland Kitchen... 1 by mouth at bedtime for blood pressure    Metolazone 2.5 Mg Tabs (Metolazone) .Marland Kitchen... 1 tab m-w-f 30 minutes before am furosemide.  BP today: 134/60 Prior BP: 120/68 (04/08/2010)  Labs Reviewed: K+: 3.2 (03/20/2010) Creat: : 0.9 (03/20/2010)      Adequate control on present medications.  Problem # 3:  MEMORY LOSS (ICD-780.93) performed well on MMSE. No indication for starting medication at this time at her age.  Complete Medication List: 1)  Ferrous Gluconate 325 Mg Tabs (Ferrous gluconate) .Marland Kitchen.. 1 two times a day 2)  Metoprolol Tartrate 50 Mg Tabs (Metoprolol tartrate) .Marland Kitchen.. 1 tab two times a day 3)  Lanoxin 0.125 Mg Tabs (Digoxin) .... Take 1 tablet by mouth once a day 4)  Furosemide 40 Mg Tabs (Furosemide) .... Take 1 tablet by mouth two times a day 5)  Metformin Hcl 500 Mg Tb24 (Metformin hcl) .... Take 1 tablet by mouth once a day 6)  Avapro 300 Mg Tabs (Irbesartan) .Marland Kitchen.. 1 once daily 7)  Fexofenadine Hcl 180 Mg Tabs (Fexofenadine hcl) .... As needed 8)  Simvastatin 20 Mg Tabs (Simvastatin) .Marland KitchenMarland KitchenMarland Kitchen  Take 1 tablet by mouth once a day 9)  Doxazosin Mesylate 4 Mg Tabs (Doxazosin mesylate) .Marland Kitchen.. 1 by mouth at bedtime for blood pressure 10)  Trazodone Hcl 50 Mg Tabs (Trazodone hcl) .Marland Kitchen.. 1 to 2 tabs at bedtime as needed 11)  Clonazepam 0.5 Mg Tabs (Clonazepam) .Marland Kitchen.. 1 tab two times a day 12)  Temazepam 15 Mg Caps (Temazepam) .Marland Kitchen.. 1 at bedtime 13)  K-phos 500 Mg Tabs (Potassium phosphate monobasic) .... 2 in the morning and 1 at bedtime 14)  Metolazone 2.5 Mg Tabs (Metolazone) .Marland Kitchen.. 1 tab m-w-f 30 minutes before am furosemide.  Other Orders: Flu Vaccine 54yrs + MEDICARE PATIENTS (Z6109) Administration Flu vaccine - MCR (U0454)     Flu Vaccine Consent Questions     Do you have a history of severe allergic reactions to this vaccine? no    Any prior history of allergic reactions to egg and/or gelatin? no    Do you have a sensitivity to the preservative Thimersol? no    Do you have a past history of Guillan-Barre Syndrome? no    Do you currently have an acute febrile illness? no    Have you ever had a severe reaction to latex? no    Vaccine information given and explained to patient? yes    Are you currently pregnant? no    Lot  Number:[id number]   Exp Date:02/07/2011   Site Given  Left Deltoid IMflu

## 2010-09-11 NOTE — Progress Notes (Signed)
  Phone Note Refill Request   Refills Requested: Medication #1:  CLONAZEPAM 0.5 MG TABS 1 tab two times a day   Last Refilled: 01/08/2010 Please Advise refills  Initial call taken by: Ami Bullins CMA,  February 05, 2010 8:56 AM  Follow-up for Phone Call        ok for refill x 5 Follow-up by: Jacques Navy MD,  February 05, 2010 1:47 PM    Prescriptions: CLONAZEPAM 0.5 MG TABS (CLONAZEPAM) 1 tab two times a day  #60 x 5   Entered by:   Ami Bullins CMA   Authorized by:   Jacques Navy MD   Signed by:   Bill Salinas CMA on 02/06/2010   Method used:   Telephoned to ...       [facility]  [address]  [phone]* (retail)       [address]       [place], [place]  [address]       Ph: [phone] or [phone]       Fax: [phone]   RxID:   [id number]

## 2010-09-11 NOTE — Progress Notes (Signed)
Summary: Family Questions  Phone Note Call from Patient   Caller: Milus Height [phone] Summary of Call: 1. Son wants info about avalide alternative. 2. Should pt continue Ferrous sulfate? 3. When is pt due for next f/u and/or labs?  Initial call taken by: Lamar Sprinkles, CMA,  October 03, 2009 10:39 AM  Follow-up for Phone Call        Spoke with Son: 1. Avalide was changed (see emr) Son is aware.  2. Ferrous sulfate is on back order per son, Should pt be taking this due to low hgb? If yes, will you write rx for alternative?  3. Son is concerned about pt "leaking blood" from somewhere that has yet to be determined and this causes chronic low hgb. He would like regular labs.  Follow-up by: Lamar Sprinkles, CMA,  October 03, 2009 2:10 PM  Additional Follow-up for Phone Call Additional follow up Details #1::        1. On avapro 2. ferrous gluconate 325mg  1 by mouth two times a day, #60 refill prn 3. last CBC Feb 1. Can repeat March 1 or after - CBC 285.9 Additional Follow-up by: Jacques Navy MD,  October 03, 2009 5:17 PM    Additional Follow-up for Phone Call Additional follow up Details #2::    Son informed Follow-up by: Lamar Sprinkles, CMA,  October 04, 2009 6:29 PM  New/Updated Medications: FERROUS GLUCONATE 325 MG TABS (FERROUS GLUCONATE) 1 two times a day Prescriptions: FERROUS GLUCONATE 325 MG TABS (FERROUS GLUCONATE) 1 two times a day  #60 x 12   Entered by:   Lamar Sprinkles, CMA   Authorized by:   Jacques Navy MD   Signed by:   Lamar Sprinkles, CMA on 10/04/2009   Method used:   Electronically to        [facility]  [phone]* (retail)       [address]       [place], [place]  [address]       Ph: [phone] or [phone]       Fax: [phone]   RxID:   [id number]

## 2010-09-11 NOTE — Assessment & Plan Note (Signed)
Summary: RETAINING FLUID---STC   Vital Signs:  Patient profile:   77 year old female Height:      64.5 inches Weight:      142 pounds BMI:     24.08 O2 Sat:      94 % on Room air Temp:     97.9 degrees F oral Pulse rate:   68 / minute BP sitting:   138 / 72  (left arm) Cuff size:   regular  Vitals Entered By: Bill Salinas CMA (March 04, 2010 4:28 PM)  O2 Flow:  Room air CC: pt here for evaluation of retaining fluid/ ab   Primary Care Provider:  [facility]  CC:  pt here for evaluation of retaining fluid/ ab.  History of Present Illness: Patient with recent adm for anemia all records reviewed - received 2 units of blood and did well discharged home with Hgb 8.6. Her meds were modified - stopping the amolodipine due to fluid retention and sent home on two times a day lasix. Since being home she has seen Dr Arbutus Ped and did have a follow-up Hgb at 8.3. She is scheduled for IV iron.  She has had continued edema up the leg and to the abdomen. She has had a bit of shortness of breath. Hospital labs reviewed: normal BUN anc Cr, last 2D echo in march '11 with nl EF, was not able to assess for diastolic dysfunction.  She has been feeling a bit achy.   Current Medications (verified): 1)  Ferrous Gluconate 325 Mg Tabs (Ferrous Gluconate) .Marland Kitchen.. 1 Two Times A Day 2)  Metoprolol Tartrate 50 Mg Tabs (Metoprolol Tartrate) .Marland Kitchen.. 1 Tab Two Times A Day 3)  Lanoxin 0.125 Mg  Tabs (Digoxin) .... Take 1 Tablet By Mouth Once A Day 4)  Furosemide 40 Mg  Tabs (Furosemide) .... Take 1 Tablet By Mouth Two Times A Day 5)  Amlodipine Besylate 10 Mg  Tabs (Amlodipine Besylate) .... Take 1 Tablet By Mouth Once A Day 6)  Metformin Hcl 500 Mg  Tb24 (Metformin Hcl) .... Take 1 Tablet By Mouth Once A Day 7)  Avapro 300 Mg Tabs (Irbesartan) .Marland Kitchen.. 1 Once Daily 8)  Fexofenadine Hcl 180 Mg Tabs (Fexofenadine Hcl) .... As Needed 9)  Simvastatin 20 Mg  Tabs (Simvastatin) .... Take 1 Tablet By Mouth Once A Day 10)  Doxazosin  Mesylate 8 Mg  Tabs (Doxazosin Mesylate) .... Take 1 Tab By Mouth At Bedtime 11)  Trazodone Hcl 50 Mg Tabs (Trazodone Hcl) .Marland Kitchen.. 1 To 2 Tabs At Bedtime As Needed 12)  Clonazepam 0.5 Mg Tabs (Clonazepam) .Marland Kitchen.. 1 Tab Two Times A Day 13)  Metronidazole 500 Mg Tabs (Metronidazole) .Marland Kitchen.. 1 By Mouth Three Times A Day X 10days For Possible C. Diff. 14)  Requip 0.5 Mg Tabs (Ropinirole Hcl) .... 1/2 At Bedtime X 6 Days Then 1 Tab At Bedtime X 10 Days Then Contact Office With Update 15)  Temazepam 15 Mg Caps (Temazepam) .Marland Kitchen.. 1 At Bedtime 16)  Hydrochlorothiazide 25 Mg Tabs (Hydrochlorothiazide) .Marland Kitchen.. 1 Once Daily 17)  Klor-Con 20 Meq Pack (Potassium Chloride) .Marland Kitchen.. 1 Once Daily  Allergies (verified): 1)  ! Codeine  Past History:  Past Medical History: Last updated: 02/13/2010 INGUINAL HERNIORRHAPHY, RIGHT, HX OF (ICD-V45.89) MITRAL VALVE INSUFF&AORTIC VALVE INSUFF (ICD-396.3) HYPERLIPIDEMIA-MIXED (ICD-272.4) OTHER AND UNSPECIFIED HYPERLIPIDEMIA (ICD-272.4) PNEUMONIA, HX OF (ICD-V12.60) OSTEOARTHRITIS (ICD-715.90) NEPHROLITHIASIS, HX OF (ICD-V13.01) HYPERTENSION (ICD-401.9) DIABETES MELLITUS, TYPE II (ICD-250.00) CORONARY ARTERY DISEASE (ICD-414.00) CONGESTIVE HEART FAILURE (ICD-428.0) ATRIAL FIBRILLATION (ICD-427.31) ASTHMA (ICD-493.90)  ANEMIA-NOS (ICD-285.9) ALLERGIC RHINITIS (ICD-477.9)  Past Surgical History: Last updated: 05/28/2009 breast cysts w/ aspiration x 50 Hysterectomy 1990 Lumbar fusion'07 [facility] Sheehan) Total hip replacement-right Valentina Gu) June 2008 Right inquinal hernia repair '10 ([facility])  Family History: Last updated: 05-14-2009 Father:decease CHF Mother:decease pneumonia  Siblings:1 brother good health  Social History: Last updated: 09/27/2008 married '42 -38 years-widowed 1 son -'35, 1 daughter '60 Lives alone - I-ADLs, including driving End-of-Life issues: no prolonged mechanical vent. support. Laymen's Guide to Death With Dignity provided with a suggestion that  she discuss these issues with her family. Her son was supportive of having this discussion. 4 grandchildren Tobacco Use - No.  Alcohol Use - no Regular Exercise - no  Review of Systems       The patient complains of weight gain.  The patient denies anorexia, fever, weight loss, decreased hearing, chest pain, dyspnea on exertion, prolonged cough, abdominal pain, severe indigestion/heartburn, muscle weakness, difficulty walking, depression, enlarged lymph nodes, and angioedema.    Physical Exam  General:  alert, well-developed, well-nourished, and well-hydrated.   Head:  normocephalic and atraumatic.   Eyes:  pupils equal and pupils round.   Neck:  supple, full ROM, and no JVD.   Lungs:  normal respiratory effort, normal breath sounds, no crackles, and no wheezes.   Heart:  normal rate.  IRIR, IV/VI mitral murmur at the apex, III/VI murmur at the RSB, II/VI murmur LSB Abdomen:  soft and normal bowel sounds.   Pulses:  2+ radiual Extremities:  2+ ;pitting edema to thigh. Neurologic:  alert & oriented X3 and cranial nerves II-XII intact.     Impression & Recommendations:  Problem # 1:  MITRAL VALVE INSUFF&AORTIC VALVE INSUFF (ICD-396.3)  Patient with severe MR. She has minimal symptoms, however managing her edema requires not getting her tood dry. Also, she is overdue for cadiology follow-up  Her updated medication list for this problem includes:    Metoprolol Tartrate 50 Mg Tabs (Metoprolol tartrate) .Marland Kitchen... 1 tab two times a day  Orders: Cardiology Referral (Cardiology)  Problem # 2:  HYPERTENSION (ICD-401.9)  The following medications were removed from the medication list:    Amlodipine Besylate 10 Mg Tabs (Amlodipine besylate) .Marland Kitchen... Take 1 tablet by mouth once a day    Hydrochlorothiazide 25 Mg Tabs (Hydrochlorothiazide) .Marland Kitchen... 1 once daily Her updated medication list for this problem includes:    Metoprolol Tartrate 50 Mg Tabs (Metoprolol tartrate) .Marland Kitchen... 1 tab two times a  day    Furosemide 40 Mg Tabs (Furosemide) .Marland Kitchen... Take 1 tablet by mouth two times a day    Avapro 300 Mg Tabs (Irbesartan) .Marland Kitchen... 1 once daily    Doxazosin Mesylate 4 Mg Tabs (Doxazosin mesylate) .Marland Kitchen... 1 by mouth at bedtime for blood pressure    Metolazone 2.5 Mg Tabs (Metolazone) .Marland Kitchen... 1 tab m-w-f 30 minutes before am furosemide.  BP today: 138/72 Prior BP: 122/48 (02/13/2010)  Labs Reviewed: K+: 3.6 (10/25/2009) Creat: : 1.0 (10/25/2009)     Good control of BP  Problem # 3:  CONGESTIVE HEART FAILURE (ICD-428.0)  Patient with probable diastolic dysfunction contributing to her peripheral edema.   Plan continue present medications.        add metolazone 2.5mg  q AM M-W-F  The following medications were removed from the medication list:    Hydrochlorothiazide 25 Mg Tabs (Hydrochlorothiazide) .Marland Kitchen... 1 once daily Her updated medication list for this problem includes:    Metoprolol Tartrate 50 Mg Tabs (Metoprolol tartrate) .Marland Kitchen... 1 tab two times  a day    Lanoxin 0.125 Mg Tabs (Digoxin) .Marland Kitchen... Take 1 tablet by mouth once a day    Furosemide 40 Mg Tabs (Furosemide) .Marland Kitchen... Take 1 tablet by mouth two times a day    Avapro 300 Mg Tabs (Irbesartan) .Marland Kitchen... 1 once daily    Metolazone 2.5 Mg Tabs (Metolazone) .Marland Kitchen... 1 tab m-w-f 30 minutes before am furosemide.  Orders: Prescription Created Electronically [id number])  Problem # 4:  ANEMIA-NOS (ICD-285.9) Patient  has had follow-up Hgb and was stable. She is for iron infusion per Dr. Arbutus Ped.  Plan - ok to d/c oral iron  Her updated medication list for this problem includes:    Ferrous Gluconate 325 Mg Tabs (Ferrous gluconate) .Marland Kitchen... 1 two times a day  Problem # 5:  DIABETES MELLITUS, TYPE II (ICD-250.00) Last A1C 7.2% while in hospital. She has resumed her metformin.  Her updated medication list for this problem includes:    Metformin Hcl 500 Mg Tb24 (Metformin hcl) .Marland Kitchen... Take 1 tablet by mouth once a day    Avapro 300 Mg Tabs (Irbesartan) .Marland Kitchen... 1  once daily  Complete Medication List: 1)  Ferrous Gluconate 325 Mg Tabs (Ferrous gluconate) .Marland Kitchen.. 1 two times a day 2)  Metoprolol Tartrate 50 Mg Tabs (Metoprolol tartrate) .Marland Kitchen.. 1 tab two times a day 3)  Lanoxin 0.125 Mg Tabs (Digoxin) .... Take 1 tablet by mouth once a day 4)  Furosemide 40 Mg Tabs (Furosemide) .... Take 1 tablet by mouth two times a day 5)  Metformin Hcl 500 Mg Tb24 (Metformin hcl) .... Take 1 tablet by mouth once a day 6)  Avapro 300 Mg Tabs (Irbesartan) .Marland Kitchen.. 1 once daily 7)  Fexofenadine Hcl 180 Mg Tabs (Fexofenadine hcl) .... As needed 8)  Simvastatin 20 Mg Tabs (Simvastatin) .... Take 1 tablet by mouth once a day 9)  Doxazosin Mesylate 4 Mg Tabs (Doxazosin mesylate) .Marland Kitchen.. 1 by mouth at bedtime for blood pressure 10)  Trazodone Hcl 50 Mg Tabs (Trazodone hcl) .Marland Kitchen.. 1 to 2 tabs at bedtime as needed 11)  Clonazepam 0.5 Mg Tabs (Clonazepam) .Marland Kitchen.. 1 tab two times a day 12)  Temazepam 15 Mg Caps (Temazepam) .Marland Kitchen.. 1 at bedtime 13)  Klor-con 20 Meq Pack (Potassium chloride) .Marland Kitchen.. 1 once daily 14)  Metolazone 2.5 Mg Tabs (Metolazone) .Marland Kitchen.. 1 tab m-w-f 30 minutes before am furosemide.  Patient Instructions: 1)  Fluid overload - lungs are clear, no evidence of overt heart failure. Plan - continue all present medications. Add metolazone 2.5 mg 30 minutes before AM furososemide M-W-F. We will get you an appointment to see Dr. Eden Emms 2)  Diabetes - stable - continue present medications. 3)  Anemia - since you will be getting an iron infusion next week it is ok to stop the oral iron.  Prescriptions: METOLAZONE 2.5 MG TABS (METOLAZONE) 1 tab M-W-F 30 minutes before AM furosemide.  #30 x 6   Entered and Authorized by:   Jacques Navy MD   Signed by:   Jacques Navy MD on 03/04/2010   Method used:   Electronically to        [facility]  [phone]* (retail)       [address]       [place], [place]  [address]       Ph: [phone] or [phone]        Fax: [phone]   RxID:   [id number]

## 2010-09-11 NOTE — Miscellaneous (Signed)
Summary: Orders/[facility]  Orders/[facility]   Imported By: Sherian Rein 12/12/2009 07:37:33  _____________________________________________________________________  External Attachment:    Type:   Image     Comment:   External Document

## 2010-09-11 NOTE — Miscellaneous (Signed)
Summary: PT Orders/[facility]  PT Orders/[facility]   Imported By: Sherian Rein 10/31/2009 14:58:47  _____________________________________________________________________  External Attachment:    Type:   Image     Comment:   External Document

## 2010-09-11 NOTE — Consult Note (Signed)
[facility]   Imported By: Lennie Odor 12/03/2009 10:34:54  _____________________________________________________________________  External Attachment:    Type:   Image     Comment:   External Document

## 2010-09-11 NOTE — Progress Notes (Signed)
Summary: CALL  Phone Note Outgoing Call   Summary of Call: call son greg - or he will call use since I left a message on his phone - that anemia is stable with Hgb 11.9 but mild heart failure based on BNP 324. Plan - increase lasix to 2 x 40mg  in AM 1 x 40mg  PM for 3 days; increase potassium to two times a day.  Initial call taken by: Jacques Navy MD,  March 21, 2010 9:46 AM  Follow-up for Phone Call        Greg left mess - we are to return call at [phone] Follow-up by: Lamar Sprinkles, CMA,  March 21, 2010 10:17 AM  Additional Follow-up for Phone Call Additional follow up Details #1::        Spoke with pt's son. He is aware of above. He feels that patient already is taking potassium two times a day. I have asked that son call office back today after confirming what dose and frequency patient is taking regarding potassium. Additional Follow-up by: Lamar Sprinkles, CMA,  March 21, 2010 10:47 AM    Additional Follow-up for Phone Call Additional follow up Details #2::    Son returned call about potassium. She takes Potassium two times a day. Emr has 1 qd. Should patient increase from this due to last labs?..................Marland KitchenLamar Sprinkles, CMA  March 21, 2010 1:50 PM   Additional Follow-up for Phone Call Additional follow up Details #3:: Details for Additional Follow-up Action Taken: due to low potassium she should double her potassium to 40 meq ( 2 tabs) x 2 doses ( tht is 40 meq AM and PM x one day) then rsume previous dose.   pt's son informed.......Marland KitchenLamar Sprinkles, CMA  March 21, 2010 4:29 PM  Additional Follow-up by: Jacques Navy MD,  March 21, 2010 2:32 PM  New/Updated Medications: KLOR-CON M20 20 MEQ CR-TABS (POTASSIUM CHLORIDE CRYS CR) 1 two times a day

## 2010-09-11 NOTE — Progress Notes (Signed)
Summary: REQ A CALL  Phone Note Call from Patient   Summary of Call: Pt's son called requesting a call back from Dr Debby Bud at  [phone]. Pt is currently inpatient at [facility] Initial call taken by: Lamar Sprinkles, CMA,  October 08, 2009 9:57 AM  Follow-up for Phone Call        called from [facility] this AM Follow-up by: Jacques Navy MD,  October 08, 2009 11:49 AM

## 2010-09-11 NOTE — Progress Notes (Signed)
Summary: REQ FOR ALT RX   Phone Note Call from Patient   Caller: Jasmine December - Daughter [phone] (11:20) Summary of Call: Pt's daughter called. Zpak given at last office visit - pt completed on Friday. Pt continues to c/o congestion, only slightly "broken up". Pt still feels bad. Req rx for another antibiotic. Please advise , pt does not want to have to come back into the office.  Initial call taken by: Lamar Sprinkles, CMA,  July 21, 2010 11:40 AM  Follow-up for Phone Call        z pak offers 10 days of antibiotic coverage. No additional antibiotics indicated unless patient is having fevers, chills, or productive sputum.  Follow-up by: Jacques Navy MD,  July 21, 2010 1:09 PM  Additional Follow-up for Phone Call Additional follow up Details #1::        Daughter informed.  Additional Follow-up by: Lamar Sprinkles, CMA,  July 21, 2010 2:14 PM

## 2010-09-11 NOTE — Assessment & Plan Note (Signed)
Summary: cxr & labs prior/SD   Vital Signs:  Patient profile:   77 year old female Height:      64 inches Weight:      136 pounds BMI:     23.43 O2 Sat:      96 % on Room air Temp:     97.8 degrees F oral Pulse rate:   56 / minute BP sitting:   162 / 60  (left arm) Cuff size:   regular  Vitals Entered By: Bill Salinas CMA (July 29, 2010 11:46 AM)  O2 Flow:  Room air CC: pt here with c/o weakness, fatigue, coughing and SOB not getting any better/ ab   Primary Care Provider:  Norins  CC:  pt here with c/o weakness, fatigue, and coughing and SOB not getting any better/ ab.  History of Present Illness: Patient presents for continued cough and her feeling ill despite two courses of azithromycin.. she describes a heaviness in her chest, cough that is productive of a light yellow mucus. She does feel more short of breath. She denies fever, chills, chest pain. She has had increased peripheral edema with weeping from the distal lower extremities. She does not recall her recnet weights, but does check her weight daily.    Current Medications (verified): 1)  Ferrous Gluconate 325 Mg Tabs (Ferrous Gluconate) .Marland Kitchen.. 1 Two Times A Day 2)  Metoprolol Tartrate 50 Mg Tabs (Metoprolol Tartrate) .Marland Kitchen.. 1 Tab Two Times A Day 3)  Lanoxin 0.125 Mg  Tabs (Digoxin) .... Take 1 Tablet By Mouth Once A Day 4)  Furosemide 40 Mg  Tabs (Furosemide) .... Take 1 Tablet By Mouth Two Times A Day 5)  Metformin Hcl 500 Mg  Tb24 (Metformin Hcl) .... Take 1 Tablet By Mouth Once A Day 6)  Avapro 300 Mg Tabs (Irbesartan) .Marland Kitchen.. 1 Once Daily 7)  Fexofenadine Hcl 180 Mg Tabs (Fexofenadine Hcl) .... As Needed 8)  Simvastatin 20 Mg  Tabs (Simvastatin) .... Take 1 Tablet By Mouth Once A Day 9)  Doxazosin Mesylate 4 Mg Tabs (Doxazosin Mesylate) .Marland Kitchen.. 1 By Mouth At Bedtime For Blood Pressure 10)  Trazodone Hcl 50 Mg Tabs (Trazodone Hcl) .Marland Kitchen.. 1 To 2 Tabs At Bedtime As Needed 11)  Clonazepam 0.5 Mg Tabs (Clonazepam) .Marland Kitchen.. 1 Tab  Two Times A Day 12)  Temazepam 15 Mg Caps (Temazepam) .Marland Kitchen.. 1 At Bedtime 13)  K-Phos 500 Mg Tabs (Potassium Phosphate Monobasic) .... 2 in The Morning and 1 At Bedtime 14)  Metolazone 2.5 Mg Tabs (Metolazone) .Marland Kitchen.. 1 Tab M-W-F 30 Minutes Before Am Furosemide. 15)  Azithromycin 500 Mg Tabs (Azithromycin) .Marland Kitchen.. 1 By Mouth Once Daily X 3 Days For Glenford Peers.  Allergies (verified): 1)  ! Codeine  Past History:  Past Medical History: Last updated: 02/13/2010 INGUINAL HERNIORRHAPHY, RIGHT, HX OF (ICD-V45.89) MITRAL VALVE INSUFF&AORTIC VALVE INSUFF (ICD-396.3) HYPERLIPIDEMIA-MIXED (ICD-272.4) OTHER AND UNSPECIFIED HYPERLIPIDEMIA (ICD-272.4) PNEUMONIA, HX OF (ICD-V12.60) OSTEOARTHRITIS (ICD-715.90) NEPHROLITHIASIS, HX OF (ICD-V13.01) HYPERTENSION (ICD-401.9) DIABETES MELLITUS, TYPE II (ICD-250.00) CORONARY ARTERY DISEASE (ICD-414.00) CONGESTIVE HEART FAILURE (ICD-428.0) ATRIAL FIBRILLATION (ICD-427.31) ASTHMA (ICD-493.90) ANEMIA-NOS (ICD-285.9) ALLERGIC RHINITIS (ICD-477.9)  Past Surgical History: Last updated: 05/28/2009 breast cysts w/ aspiration x 50 Hysterectomy 1990 Lumbar fusion'07 Lovell Sheehan) Total hip replacement-right Valentina Gu) June 2008 Right inquinal hernia repair '10 ([facility]) FH reviewed for relevance, SH/Risk Factors reviewed for relevance  Review of Systems       The patient complains of dyspnea on exertion, peripheral edema, and prolonged cough.  The patient denies anorexia, fever, weight loss,  weight gain, decreased hearing, hoarseness, chest pain, syncope, headaches, hemoptysis, abdominal pain, severe indigestion/heartburn, muscle weakness, difficulty walking, depression, abnormal bleeding, and enlarged lymph nodes.    Physical Exam  General:  Elderly white woman in a w/c in no acute distress Head:  normocephalic and atraumatic.   Eyes:  pupils equal and pupils round.  C&S Neck:  full ROM, no thyromegaly, and no JVD.   Lungs:  normal respiratory effort, no accessory  muscle use, normal breath sounds, no crackles, and no wheezes.   Heart:  normal rate, regular rhythm, and no HJR.   Msk:  no joint tenderness, no joint swelling, and no joint warmth.   Pulses:  2+ radial Neurologic:  alert & oriented X3, cranial nerves II-XII intact, and strength normal in all extremities.   Skin:  turgor normal and color normal.   Cervical Nodes:  no anterior cervical adenopathy and no posterior cervical adenopathy.   Psych:  Oriented X3, memory intact for recent and remote, normally interactive, and good eye contact.     Impression & Recommendations:  Problem # 1:  CONGESTIVE HEART FAILURE (ICD-428.0) Patient with no signs of exam of infection. CBC with normal white count. CXR with small right pleural effusion but no interstial  changes - no infiltrates no acute edema. Her symptoms are strongly suggestive of CHF with decompensation.  Plan - BNP           increase metolazone to daily for 7 days           continue present dose of lasix.            For peripheral edema: continue to use support hose; elevate legs  Her updated medication list for this problem includes:    Metoprolol Tartrate 50 Mg Tabs (Metoprolol tartrate) .Marland Kitchen... 1 tab two times a day    Lanoxin 0.125 Mg Tabs (Digoxin) .Marland Kitchen... Take 1 tablet by mouth once a day    Furosemide 40 Mg Tabs (Furosemide) .Marland Kitchen... Take 1 tablet by mouth two times a day    Avapro 300 Mg Tabs (Irbesartan) .Marland Kitchen... 1 once daily    Metolazone 2.5 Mg Tabs (Metolazone) .Marland Kitchen... 1 tab m-w-f 30 minutes before am furosemide.  Addendum - BNP 563 Plan - change lasix to 80mg  AM and 40mg  PM for 3 days then resume 40mg  two times a day. (patient called 12/21-spoke with son)  Problem # 2:  OSTEOARTHRITIS (ICD-715.90) Patient has been taking increased doses of aleve for arthritis pain. she has had some nausea and discomfort. She has a history of GI bleed.  Plan - she is adamantly instructed to reduce aleve to no more than 1 talbet two times a day; she  should take APAP 1000mg  three times a day on schedule.   Her updated medication list for this problem includes:    Aleve 220 Mg Tabs (Naproxen sodium) .Marland Kitchen... 1 by mouth two times a day  Complete Medication List: 1)  Ferrous Gluconate 325 Mg Tabs (Ferrous gluconate) .Marland Kitchen.. 1 two times a day 2)  Metoprolol Tartrate 50 Mg Tabs (Metoprolol tartrate) .Marland Kitchen.. 1 tab two times a day 3)  Lanoxin 0.125 Mg Tabs (Digoxin) .... Take 1 tablet by mouth once a day 4)  Furosemide 40 Mg Tabs (Furosemide) .... Take 1 tablet by mouth two times a day 5)  Metformin Hcl 500 Mg Tb24 (Metformin hcl) .... Take 1 tablet by mouth once a day 6)  Avapro 300 Mg Tabs (Irbesartan) .Marland Kitchen.. 1 once daily 7)  Fexofenadine Hcl  180 Mg Tabs (Fexofenadine hcl) .... As needed 8)  Simvastatin 20 Mg Tabs (Simvastatin) .... Take 1 tablet by mouth once a day 9)  Doxazosin Mesylate 4 Mg Tabs (Doxazosin mesylate) .Marland Kitchen.. 1 by mouth at bedtime for blood pressure 10)  Trazodone Hcl 50 Mg Tabs (Trazodone hcl) .Marland Kitchen.. 1 to 2 tabs at bedtime as needed 11)  Clonazepam 0.5 Mg Tabs (Clonazepam) .Marland Kitchen.. 1 tab two times a day 12)  Temazepam 15 Mg Caps (Temazepam) .Marland Kitchen.. 1 at bedtime 13)  K-phos 500 Mg Tabs (Potassium phosphate monobasic) .... 2 in the morning and 1 at bedtime 14)  Metolazone 2.5 Mg Tabs (Metolazone) .Marland Kitchen.. 1 tab m-w-f 30 minutes before am furosemide. 15)  Aleve 220 Mg Tabs (Naproxen sodium) .Marland Kitchen.. 1 by mouth two times a day 16)  Nexium 40 Mg Cpdr (Esomeprazole magnesium) .... Take 1 every morning  Patient Instructions: 1)  cough - no evidence of infection: white blood count is normal, chest x-ray with no indication of infection but there is a small pleural effusion. the oxygen level today is good at 96%. Suspect the problem is worsened congestive heart failure. Plan: increase metalozone to once a day for 7 days. Continue present dose of furosemide. 2)  Rule of 2's: weigh every day, same time same garb. If you gain 2 or more pounds in 2 days, double the  furosemide for two days.  3)  For swelling in the legs: continue to use stockings, elevate your legs. 4)  For arhtirits - no more than 1 aleve AM and PM. Take tylenol 500mg  two tablets 3 times a day on schedule. Start nexium 40mg  once every morning to protect your stomach.  Prescriptions: NEXIUM 40 MG CPDR (ESOMEPRAZOLE MAGNESIUM) take 1 every morning  #30 x 12   Entered and Authorized by:   Jacques Navy MD   Signed by:   Jacques Navy MD on 07/29/2010   Method used:   Electronically to        [facility]  [phone]* (retail)       [address]       [place], [place]  [address]       Ph: [phone] or [phone]       Fax: [phone]   RxID:   [id number]    Orders Added: 1)  Est. Patient Level IV [34742]

## 2010-09-11 NOTE — Consult Note (Signed)
Summary: [facility]   Imported By: Sherian Rein 06/03/2010 14:30:13  _____________________________________________________________________  External Attachment:    Type:   Image     Comment:   External Document

## 2010-09-11 NOTE — Progress Notes (Signed)
  Phone Note Refill Request Message from:  Fax from Pharmacy on June 18, 2010 12:49 PM  Refills Requested: Medication #1:  TRAZODONE HCL 50 MG TABS 1 to 2 tabs at bedtime as needed   Last Refilled: 05/08/2010 Please Advise refills  Initial call taken by: Ami Bullins CMA,  June 18, 2010 12:49 PM

## 2010-09-11 NOTE — Progress Notes (Signed)
Summary: PT eval?  Phone Note Other Incoming   Caller: Joyce Gross with [facility] [phone] Summary of Call: Joyce Gross called stating that pt is being seen for [facility] services but is still complaining of weakness.  Joyce Gross is requesting a verbal okay from PCP for a PT eval. Please advise [facility] that [facility] is pt's PCP Initial call taken by: Margaret Pyle, CMA,  February 19, 2010 4:36 PM  Follow-up for Phone Call        OK for PT eval and treat. Progressive weakness. Chronic anemia s/p observation hospitalization for transfusion. Follow-up by: Jacques Navy MD,  February 19, 2010 7:58 PM  Additional Follow-up for Phone Call Additional follow up Details #1::        Virgel Gess at [facility] for her to call me back Additional Follow-up by: Ami Bullins CMA,  February 20, 2010 8:11 AM    Additional Follow-up for Phone Call Additional follow up Details #2::    spoke with kay and gave her verbal for PT and evaluation.  Follow-up by: Ami Bullins CMA,  February 20, 2010 4:08 PM

## 2010-09-11 NOTE — Progress Notes (Signed)
Summary: REFILL  Phone Note Call from Patient Call back at [facility] [phone]   Summary of Call: Pt's son called req a refill of temazepam. Per phone note 08/15/09 son had said this was not working and requip was started. Per son pt is taking, this med and wants a refill. Ok?  Initial call taken by: Lamar Sprinkles, CMA,  September 05, 2009 10:57 AM  Follow-up for Phone Call        ok for refill on temazepam, #30, x 5 Follow-up by: Jacques Navy MD,  September 05, 2009 1:15 PM  Additional Follow-up for Phone Call Additional follow up Details #1::        Called in, son informed Additional Follow-up by: Lamar Sprinkles, CMA,  September 05, 2009 2:22 PM    New/Updated Medications: TEMAZEPAM 15 MG CAPS (TEMAZEPAM) 1 at bedtime Prescriptions: TEMAZEPAM 15 MG CAPS (TEMAZEPAM) 1 at bedtime  #30 x 5   Entered by:   Lamar Sprinkles, CMA   Authorized by:   Jacques Navy MD   Signed by:   Lamar Sprinkles, CMA on 09/05/2009   Method used:   Telephoned to ...       [facility]  [address]  [phone]* (retail)       [address]       [place], [place]  [address]       Ph: [phone] or [phone]       Fax: [phone]   RxID:   [id number]

## 2010-09-11 NOTE — Miscellaneous (Signed)
Summary: Interim Order / [facility]  Interim Order / [facility]   Imported By: Lennie Odor 03/13/2010 11:05:03  _____________________________________________________________________  External Attachment:    Type:   Image     Comment:   External Document

## 2010-09-11 NOTE — Medication Information (Signed)
Summary: Diabetic Supplies / [facility]   Diabetic Supplies / [facility]   Imported By: Lennie Odor 02/26/2010 10:39:38  _____________________________________________________________________  External Attachment:    Type:   Image     Comment:   External Document

## 2010-09-11 NOTE — Assessment & Plan Note (Signed)
Summary: COUGHING AND TIGHTNESS IN CHEST-LB   Vital Signs:  Patient profile:   77 year old female Height:      64 inches (162.56 cm) Weight:      135 pounds (61.36 kg) BMI:     23.26 O2 Sat:      94 % on Room air Temp:     98.1 degrees F (36.72 degrees C) oral Pulse rate:   62 / minute BP sitting:   132 / 68  (left arm) Cuff size:   regular  Vitals Entered By: Brenton Grills CMA (AAMA) (July 15, 2010 5:00 PM)  O2 Flow:  Room air CC: Pt c/o cough and congestion x 3 days/aj Is Patient Diabetic? Yes   Primary Care Provider:  Norins  CC:  Pt c/o cough and congestion x 3 days/aj.  History of Present Illness: Patient presents for a 3 day h/o cough that is non-productive, mild low grade fever, mild SOB, no wheezing. She has not had rigors, N/V/D. She has been taking robitussin DM.  She has had a decreased appetite, but she has been keeping up her fluid intake.   Current Medications (verified): 1)  Ferrous Gluconate 325 Mg Tabs (Ferrous Gluconate) .Marland Kitchen.. 1 Two Times A Day 2)  Metoprolol Tartrate 50 Mg Tabs (Metoprolol Tartrate) .Marland Kitchen.. 1 Tab Two Times A Day 3)  Lanoxin 0.125 Mg  Tabs (Digoxin) .... Take 1 Tablet By Mouth Once A Day 4)  Furosemide 40 Mg  Tabs (Furosemide) .... Take 1 Tablet By Mouth Two Times A Day 5)  Metformin Hcl 500 Mg  Tb24 (Metformin Hcl) .... Take 1 Tablet By Mouth Once A Day 6)  Avapro 300 Mg Tabs (Irbesartan) .Marland Kitchen.. 1 Once Daily 7)  Fexofenadine Hcl 180 Mg Tabs (Fexofenadine Hcl) .... As Needed 8)  Simvastatin 20 Mg  Tabs (Simvastatin) .... Take 1 Tablet By Mouth Once A Day 9)  Doxazosin Mesylate 4 Mg Tabs (Doxazosin Mesylate) .Marland Kitchen.. 1 By Mouth At Bedtime For Blood Pressure 10)  Trazodone Hcl 50 Mg Tabs (Trazodone Hcl) .Marland Kitchen.. 1 To 2 Tabs At Bedtime As Needed 11)  Clonazepam 0.5 Mg Tabs (Clonazepam) .Marland Kitchen.. 1 Tab Two Times A Day 12)  Temazepam 15 Mg Caps (Temazepam) .Marland Kitchen.. 1 At Bedtime 13)  K-Phos 500 Mg Tabs (Potassium Phosphate Monobasic) .... 2 in The Morning and 1 At  Bedtime 14)  Metolazone 2.5 Mg Tabs (Metolazone) .Marland Kitchen.. 1 Tab M-W-F 30 Minutes Before Am Furosemide.  Allergies (verified): 1)  ! Codeine  Past History:  Past Medical History: Last updated: 02/13/2010 INGUINAL HERNIORRHAPHY, RIGHT, HX OF (ICD-V45.89) MITRAL VALVE INSUFF&AORTIC VALVE INSUFF (ICD-396.3) HYPERLIPIDEMIA-MIXED (ICD-272.4) OTHER AND UNSPECIFIED HYPERLIPIDEMIA (ICD-272.4) PNEUMONIA, HX OF (ICD-V12.60) OSTEOARTHRITIS (ICD-715.90) NEPHROLITHIASIS, HX OF (ICD-V13.01) HYPERTENSION (ICD-401.9) DIABETES MELLITUS, TYPE II (ICD-250.00) CORONARY ARTERY DISEASE (ICD-414.00) CONGESTIVE HEART FAILURE (ICD-428.0) ATRIAL FIBRILLATION (ICD-427.31) ASTHMA (ICD-493.90) ANEMIA-NOS (ICD-285.9) ALLERGIC RHINITIS (ICD-477.9)  Past Surgical History: Last updated: 05/28/2009 breast cysts w/ aspiration x 50 Hysterectomy 1990 Lumbar fusion'07 Lovell Sheehan) Total hip replacement-right Valentina Gu) June 2008 Right inquinal hernia repair '10 ([facility]ower)  Family History: Last updated: 05/14/2009 Father:decease CHF Mother:decease pneumonia  Siblings:1 brother good health  Social History: Last updated: 09/27/2008 married '42 -38 years-widowed 1 son -'69, 1 daughter '26 Lives alone - I-ADLs, including driving End-of-Life issues: no prolonged mechanical vent. support. Laymen's Guide to Death With Dignity provided with a suggestion that she discuss these issues with her family. Her son was supportive of having this discussion. 4 grandchildren Tobacco Use - No.  Alcohol Use -  no Regular Exercise - no  Review of Systems       The patient complains of dyspnea on exertion and prolonged cough.  The patient denies anorexia, fever, weight loss, chest pain, headaches, abdominal pain, severe indigestion/heartburn, muscle weakness, depression, unusual weight change, and enlarged lymph nodes.    Physical Exam  General:  alert and well-developed well preserved elderly white female in no acute distress.     Head:  normocephalic and atraumatic.   Eyes:  C&S clear Neck:  supple and full ROM.   Lungs:  normal respiratory effort, no intercostal retractions, and no crackles.  Wheezing noted on anterior ausculatation of right chest.  Heart:  normal rate and regular rhythm.   Neurologic:  alert & oriented X3 and cranial nerves II-XII intact.   Skin:  turgor normal and color normal.   Cervical Nodes:  no anterior cervical adenopathy and no posterior cervical adenopathy.   Psych:  Oriented X3.     Impression & Recommendations:  Problem # 1:  URI (ICD-465.9) Patient with mild URI by symptoms that may be an early bacterial infection especially in light of her history of previous infections.   Plan - Azithromycin 500mg  once daily x 3           routine supportive care.   Her updated medication list for this problem includes:    Fexofenadine Hcl 180 Mg Tabs (Fexofenadine hcl) .Marland Kitchen... As needed  Complete Medication List: 1)  Ferrous Gluconate 325 Mg Tabs (Ferrous gluconate) .Marland Kitchen.. 1 two times a day 2)  Metoprolol Tartrate 50 Mg Tabs (Metoprolol tartrate) .Marland Kitchen.. 1 tab two times a day 3)  Lanoxin 0.125 Mg Tabs (Digoxin) .... Take 1 tablet by mouth once a day 4)  Furosemide 40 Mg Tabs (Furosemide) .... Take 1 tablet by mouth two times a day 5)  Metformin Hcl 500 Mg Tb24 (Metformin hcl) .... Take 1 tablet by mouth once a day 6)  Avapro 300 Mg Tabs (Irbesartan) .Marland Kitchen.. 1 once daily 7)  Fexofenadine Hcl 180 Mg Tabs (Fexofenadine hcl) .... As needed 8)  Simvastatin 20 Mg Tabs (Simvastatin) .... Take 1 tablet by mouth once a day 9)  Doxazosin Mesylate 4 Mg Tabs (Doxazosin mesylate) .Marland Kitchen.. 1 by mouth at bedtime for blood pressure 10)  Trazodone Hcl 50 Mg Tabs (Trazodone hcl) .Marland Kitchen.. 1 to 2 tabs at bedtime as needed 11)  Clonazepam 0.5 Mg Tabs (Clonazepam) .Marland Kitchen.. 1 tab two times a day 12)  Temazepam 15 Mg Caps (Temazepam) .Marland Kitchen.. 1 at bedtime 13)  K-phos 500 Mg Tabs (Potassium phosphate monobasic) .... 2 in the morning and 1  at bedtime 14)  Metolazone 2.5 Mg Tabs (Metolazone) .Marland Kitchen.. 1 tab m-w-f 30 minutes before am furosemide. 15)  Azithromycin 500 Mg Tabs (Azithromycin) .Marland Kitchen.. 1 by mouth once daily x 3 days for uri.  Patient Instructions: 1)  Upper respiratory infection with no sign of pneumonia. There is very mild wheezing. Plan: azithromycin 500mg  once a day for three days (10 days of antibiotic coverage), robitussin DM 1 tsp every six hours, hydrate, tylenol for any fever, diet of choice. Call for increased shortness of breath, fever or inabilty to take medications.  Prescriptions: AZITHROMYCIN 500 MG TABS (AZITHROMYCIN) 1 by mouth once daily x 3 days for URI.  #3 x 0   Entered and Authorized by:   Jacques Navy MD   Signed by:   Jacques Navy MD on 07/15/2010   Method used:   Electronically to  [facility]  [address]  [phone]* (retail)       [address]       [place], [place]  [address]       Ph: [phone] or [phone]       Fax: [phone]   RxID:   [id number]    Orders Added: 1)  Est. Patient Level III [34742]

## 2010-09-11 NOTE — Progress Notes (Signed)
Summary: APPT-MONDAY  ---- Converted from flag ---- ---- 09/04/2009 9:23 AM, Jacques Navy MD wrote: [name] being discharged from hospital today. will need an appointment for monday. She needs to come in early enough for a STAT CBC  285.9  Thanks ------------------------------  Gave pt:  09/09/09 @ 9A w/Dr Ala Bent

## 2010-09-11 NOTE — Progress Notes (Signed)
  Phone Note Outgoing Call   Reason for Call: Discuss lab or test results Summary of Call: please call pat's son: hemoglobin is stable, CHF is stable.  Thanks Initial call taken by: Jacques Navy MD,  October 27, 2009 3:24 PM  Follow-up for Phone Call        informed pt and she stated that she would inform her son and if he had any questions he would call the office. Follow-up by: Ami Bullins CMA,  October 29, 2009 9:47 AM

## 2010-09-11 NOTE — Assessment & Plan Note (Signed)
Summary: PER FLAG/MEN SCHED--POST HOSP--[facility]C   Vital Signs:  Patient profile:   77 year old female Height:      61 inches Weight:      128 pounds O2 Sat:      95 % on Room air Temp:     98.0 degrees F oral Pulse rate:   70 / minute BP sitting:   152 / 72  (left arm) Cuff size:   regular  Vitals Entered By: Bill Salinas CMA (September 10, 2009 3:52 PM)  O2 Flow:  Room air CC: hosp follow up/ ab   Primary Care Provider:  [facility]  CC:  hosp follow up/ ab.  History of Present Illness: Patient presents for hospital follow-up: she was admitted for weakness with SOB with a diagnosis of mild CHF and was found to be anemic. All records reviewed: her Hgb dropped to 8.4 and she received 2 units PRBC with an appropriate rise in Hgb and real improvement in her energy level. Retic count was normal but Iron level was very low with % sat of 5. she was sent home on iron. She had a 2D echo with an EF 55-60%, severe MR, moderate AR.  Since being home she has done well except for some nausea and increased problem with anxiety. She has seen no blood in the stools, although they are darker. She has had some paresthesia of the feet.  Chart further reviewed: she had colonoscopy in Sept '09 with 3 polyps that appeared to Dr. Virginia Rochester to be benign. they were not removed due to impending surgery. she has had two CT Abd/Pelvis studies in '09 and '10 which were unremarkable: no thickened bowel wall or other abnormality.   Current Medications (verified): 1)  Ferrous Sulfate 325 (65 Fe) Mg  Tabs (Ferrous Sulfate) .... Take 1 Tablet By Mouth Twice A Day 2)  Metoprolol Tartrate 50 Mg Tabs (Metoprolol Tartrate) .Marland Kitchen.. 1 Tab Two Times A Day 3)  Lanoxin 0.125 Mg  Tabs (Digoxin) .... Take 1 Tablet By Mouth Once A Day 4)  Furosemide 40 Mg  Tabs (Furosemide) .... Take 1 Tablet By Mouth Two Times A Day 5)  Amlodipine Besylate 10 Mg  Tabs (Amlodipine Besylate) .... Take 1 Tablet By Mouth Once A Day 6)  Metformin Hcl 500 Mg  Tb24  (Metformin Hcl) .... Take 1 Tablet By Mouth Once A Day 7)  Avalide 300-25 Mg  Tabs (Irbesartan-Hydrochlorothiazide) .... Take 1 Tablet By Mouth Once A Day 8)  Fexofenadine Hcl 180 Mg Tabs (Fexofenadine Hcl) .... As Needed 9)  Simvastatin 20 Mg  Tabs (Simvastatin) .... Take 1 Tablet By Mouth Once A Day 10)  Doxazosin Mesylate 8 Mg  Tabs (Doxazosin Mesylate) .... Take 1 Tab By Mouth At Bedtime 11)  Trazodone Hcl 50 Mg Tabs (Trazodone Hcl) .Marland Kitchen.. 1 To 2 Tabs At Bedtime As Needed 12)  Clonazepam 0.5 Mg Tabs (Clonazepam) .Marland Kitchen.. 1 Tab Two Times A Day 13)  Metronidazole 500 Mg Tabs (Metronidazole) .Marland Kitchen.. 1 By Mouth Three Times A Day X 10days For Possible C. Diff. 14)  Requip 0.5 Mg Tabs (Ropinirole Hcl) .... 1/2 At Bedtime X 6 Days Then 1 Tab At Bedtime X 10 Days Then Contact Office With Update 15)  Promethazine Hcl 12.5 Mg Tabs (Promethazine Hcl) .Marland Kitchen.. 1 By Mouth Q6 As Needed Nausea 16)  Temazepam 15 Mg Caps (Temazepam) .Marland Kitchen.. 1 At Bedtime  Allergies (verified): 1)  ! Codeine  Past History:  Past Medical History: Last updated: 05/28/2009 INGUINAL HERNIORRHAPHY,  RIGHT, HX OF (ICD-V45.89) MITRAL VALVE INSUFF&AORTIC VALVE INSUFF (ICD-396.3) HYPERLIPIDEMIA-MIXED (ICD-272.4) OTHER AND UNSPECIFIED HYPERLIPIDEMIA (ICD-272.4) PNEUMONIA, HX OF (ICD-V12.60) OSTEOARTHRITIS (ICD-715.90) NEPHROLITHIASIS, HX OF (ICD-V13.01) HYPERTENSION (ICD-401.9) DIABETES MELLITUS, TYPE II (ICD-250.00) CORONARY ARTERY DISEASE (ICD-414.00) CONGESTIVE HEART FAILURE (ICD-428.0) ATRIAL FIBRILLATION (ICD-427.31) ASTHMA (ICD-493.90) ANEMIA-NOS (ICD-285.9) ALLERGIC RHINITIS (ICD-477.9)  Past Surgical History: Last updated: 05/28/2009 breast cysts w/ aspiration x 50 Hysterectomy 1990 Lumbar fusion'07 Lovell Sheehan) Total hip replacement-right Valentina Gu) June 2008 Right inquinal hernia repair '10 ([facility])  Family History: Last updated: May 10, 2009 Father:decease CHF Mother:decease pneumonia  Siblings:1 brother good  health  Social History: Last updated: 09/27/2008 married '42 -38 years-widowed 1 son -'87, 1 daughter '59 Lives alone - I-ADLs, including driving End-of-Life issues: no prolonged mechanical vent. support. Laymen's Guide to Death With Dignity provided with a suggestion that she discuss these issues with her family. Her son was supportive of having this discussion. 4 grandchildren Tobacco Use - No.  Alcohol Use - no Regular Exercise - no  Review of Systems  The patient denies anorexia, fever, weight loss, weight gain, vision loss, syncope, dyspnea on exertion, prolonged cough, hematochezia, muscle weakness, difficulty walking, depression, enlarged lymph nodes, and breast masses.    Physical Exam  General:  Pleasant well groomed white elderly female in no distress Head:  Normocephalic and atraumatic without obvious abnormalities. No apparent alopecia or balding. Eyes:  No corneal or conjunctival inflammation noted. EOMI. Perrla. Funduscopic exam benign, without hemorrhages, exudates or papilledema. Vision grossly normal. Neck:  supple and full ROM.   Chest Wall:  no deformities.   Lungs:  normal respiratory effort and normal breath sounds.   Heart:  IV/VI murmur at the apex and axillary line, II/VI murmur at the RSB. Irregularly irregular rythm that is rate controlled Abdomen:  soft, non-tender, normal bowel sounds, no guarding, no rigidity, and no hepatomegaly.   Msk:  normal ROM, no joint tenderness, no joint swelling, and no redness over joints.   Pulses:  2+ radial, 1+ DP pulses Neurologic:  alert & oriented X3 and cranial nerves II-XII intact.  decreased deep vibratory sensation both feet.  Skin:  turgor normal, color normal, no rashes, and no suspicious lesions.     Impression & Recommendations:  Problem # 1:  HYPERTENSION (ICD-401.9)  Her updated medication list for this problem includes:    Metoprolol Tartrate 50 Mg Tabs (Metoprolol tartrate) .Marland Kitchen... 1 tab two times a day     Furosemide 40 Mg Tabs (Furosemide) .Marland Kitchen... Take 1 tablet by mouth two times a day    Amlodipine Besylate 10 Mg Tabs (Amlodipine besylate) .Marland Kitchen... Take 1 tablet by mouth once a day    Avalide 300-25 Mg Tabs (Irbesartan-hydrochlorothiazide) .Marland Kitchen... Take 1 tablet by mouth once a day    Doxazosin Mesylate 8 Mg Tabs (Doxazosin mesylate) .Marland Kitchen... Take 1 tab by mouth at bedtime  Orders: TLB-BMP (Basic Metabolic Panel-BMET) (80048-METABOL)  BP today: 152/72 Prior BP: 164/72 (08/12/2009)  Adequate control all things considered. Continue present meds    Problem # 2:  CONGESTIVE HEART FAILURE (ICD-428.0) No evidence of decompensation on today's exam.  Plan - continue her present medications.  Her updated medication list for this problem includes:    Metoprolol Tartrate 50 Mg Tabs (Metoprolol tartrate) .Marland Kitchen... 1 tab two times a day    Lanoxin 0.125 Mg Tabs (Digoxin) .Marland Kitchen... Take 1 tablet by mouth once a day    Furosemide 40 Mg Tabs (Furosemide) .Marland Kitchen... Take 1 tablet by mouth two times a day    Avalide 300-25  Mg Tabs (Irbesartan-hydrochlorothiazide) .Marland Kitchen... Take 1 tablet by mouth once a day  Orders: TLB-BNP (B-Natriuretic Peptide) (83880-BNPR)  addendum - BNP is mildly elevated - need to continue taking all present medications, including lasix (furosemide twice a day)  Problem # 3:  ANEMIA-NOS (ICD-285.9) Recent transfusion for iron deficiency anemia. She did have colonoscopy in '09 and has had to CT abd/pelvis done since. She has no constitutional signs of malignancy.  Plan - f/u lab           given her age, co-morbidities, findings at colonoscopy and normal CT scans will not recommend repeat endoscopy or imaging.  Her updated medication list for this problem includes:    Ferrous Sulfate 325 (65 Fe) Mg Tabs (Ferrous sulfate) .Marland Kitchen... Take 1 tablet by mouth twice a day  Orders: TLB-CBC Platelet - w/Differential (85025-CBCD) TLB-IBC Pnl (Iron/FE;Transferrin) (83550-IBC)  Addendum - % iron saturation is 8.5%  - need to continue on iron supplement twice a day.   Complete Medication List: 1)  Ferrous Sulfate 325 (65 Fe) Mg Tabs (Ferrous sulfate) .... Take 1 tablet by mouth twice a day 2)  Metoprolol Tartrate 50 Mg Tabs (Metoprolol tartrate) .Marland Kitchen.. 1 tab two times a day 3)  Lanoxin 0.125 Mg Tabs (Digoxin) .... Take 1 tablet by mouth once a day 4)  Furosemide 40 Mg Tabs (Furosemide) .... Take 1 tablet by mouth two times a day 5)  Amlodipine Besylate 10 Mg Tabs (Amlodipine besylate) .... Take 1 tablet by mouth once a day 6)  Metformin Hcl 500 Mg Tb24 (Metformin hcl) .... Take 1 tablet by mouth once a day 7)  Avalide 300-25 Mg Tabs (Irbesartan-hydrochlorothiazide) .... Take 1 tablet by mouth once a day 8)  Fexofenadine Hcl 180 Mg Tabs (Fexofenadine hcl) .... As needed 9)  Simvastatin 20 Mg Tabs (Simvastatin) .... Take 1 tablet by mouth once a day 10)  Doxazosin Mesylate 8 Mg Tabs (Doxazosin mesylate) .... Take 1 tab by mouth at bedtime 11)  Trazodone Hcl 50 Mg Tabs (Trazodone hcl) .Marland Kitchen.. 1 to 2 tabs at bedtime as needed 12)  Clonazepam 0.5 Mg Tabs (Clonazepam) .Marland Kitchen.. 1 tab two times a day 13)  Metronidazole 500 Mg Tabs (Metronidazole) .Marland Kitchen.. 1 by mouth three times a day x 10days for possible c. diff. 14)  Requip 0.5 Mg Tabs (Ropinirole hcl) .... 1/2 at bedtime x 6 days then 1 tab at bedtime x 10 days then contact office with update 15)  Promethazine Hcl 12.5 Mg Tabs (Promethazine hcl) .Marland Kitchen.. 1 by mouth q6 as needed nausea 16)  Temazepam 15 Mg Caps (Temazepam) .Marland Kitchen.. 1 at bedtime  Patient: [name] Note: All result statuses are Final unless otherwise noted.  Tests: (1) CBC Platelet w/Diff (CBCD)   White Cell Count          6.8 K/uL                    4.5-10.5   Red Cell Count            4.12 Mil/uL                 3.87-5.11   Hemoglobin           [L]  11.0 g/dL                   91.4-78.2   Hematocrit           [L]  34.5 %  36.0-46.0   MCV                       83.9 fl                      78.0-100.0   MCHC                      31.9 g/dL                   16.1-09.6   RDW                  [H]  19.0 H %                    11.5-14.6   Platelet Count            169.0 K/uL                  150.0-400.0   Neutrophil %              67.3 %                      43.0-77.0   Lymphocyte %              18.6 %                      12.0-46.0   Monocyte %                10.6 %                      3.0-12.0   Eosinophils%              2.5 %                       0.0-5.0   Basophils %               1.0 %                       0.0-3.0   Neutrophill Absolute      4.5 K/uL                    1.4-7.7   Lymphocyte Absolute       1.3 K/uL                    0.7-4.0   Monocyte Absolute         0.7 K/uL                    0.1-1.0  Eosinophils, Absolute                             0.2 K/uL                    0.0-0.7   Basophils Absolute        0.1 K/uL                    0.0-0.1  Tests: (2) IBC Panel (IBC)   Iron                      43 ug/dL  42-145   Transferrin          [H]  360.1 mg/dL                 045.4-098.1   Iron Saturation      [L]  8.5 %                       20.0-50.0  Tests: (3) B-Type Natiuretic Peptide (BNPR)  B-Type Natriuetic Peptide                        [H]  312.0 pg/mL                 0.0-100.0  Tests: (4) BMP (METABOL)   Sodium                    145 mEq/L                   135-145   Potassium                 4.1 mEq/L                   3.5-5.1   Chloride                  104 mEq/L                   96-112   Carbon Dioxide       [H]  34 mEq/L                    19-32   Glucose                   98 mg/dL                    19-14   BUN                  [H]  24 mg/dL                    7-82   Creatinine                0.8 mg/dL                   9.5-6.2   Calcium                   10.1 mg/dL                  1.3-08.6   GFR                       72.06 mL/min                >60

## 2010-09-11 NOTE — Miscellaneous (Signed)
Summary: Plan of Care & Treatment/[facility]  Plan of Care & Treatment/[facility]   Imported By: Sherian Rein 09/24/2009 07:23:56  _____________________________________________________________________  External Attachment:    Type:   Image     Comment:   External Document

## 2010-09-11 NOTE — Miscellaneous (Signed)
Summary: [name]   Imported By: Lester Galt 05/12/2010 07:45:08  _____________________________________________________________________  External Attachment:    Type:   Image     Comment:   External Document

## 2010-09-11 NOTE — Miscellaneous (Signed)
Summary: Order update/[facility]  Order update/[facility]   Imported By: Lester [place] 03/07/2010 08:46:29  _____________________________________________________________________  External Attachment:    Type:   Image     Comment:   External Document

## 2010-09-11 NOTE — Letter (Signed)
[facility]   Imported By: Lester [place] 03/11/2010 10:04:59  _____________________________________________________________________  External Attachment:    Type:   Image     Comment:   External Document

## 2010-09-11 NOTE — Miscellaneous (Signed)
Summary: Certification & Plan of Care / [facility]  Certification & Plan of Care / [facility]   Imported By: Lennie Odor 02/28/2010 11:56:18  _____________________________________________________________________  External Attachment:    Type:   Image     Comment:   External Document

## 2010-09-11 NOTE — Progress Notes (Signed)
Summary: RESULTS  Phone Note Call from Patient   Caller: [phone] Greg Summary of Call: Pt's son is req results of stool specimen.  Initial call taken by: Lamar Sprinkles, CMA,  August 15, 2009 2:33 PM  Follow-up for Phone Call        called and spoke w/ pt & son: c. diff neg. con't flagyl.  sleep problem. temazepam not working. He does report that she has restless legs.  Plan - will call in a starter regimen for requip Follow-up by: Jacques Navy MD,  August 15, 2009 6:30 PM  Additional Follow-up for Phone Call Additional follow up Details #1::        call in requip 0.5mg , #15  1/2 tab at bedtime x 6, then 1 tab at bedtime x10. Pt or son to call to report if effective and tolerated. If not effective will increase to 1 mg at bedtime.  Additional Follow-up by: Jacques Navy MD,  August 16, 2009 9:34 AM    New/Updated Medications: REQUIP 0.5 MG TABS (ROPINIROLE HCL) 1/2 at bedtime x 6 days then 1 tab at bedtime x 10 days then contact office with update Prescriptions: REQUIP 0.5 MG TABS (ROPINIROLE HCL) 1/2 at bedtime x 6 days then 1 tab at bedtime x 10 days then contact office with update  #15 x 0   Entered by:   Lamar Sprinkles, CMA   Authorized by:   Jacques Navy MD   Signed by:   Lamar Sprinkles, CMA on 08/16/2009   Method used:   Electronically to        [facility]  [phone]* (retail)       [address]       [place], [place]  [address]       Ph: [phone] or [phone]       Fax: [phone]   RxID:   [id number]

## 2010-09-11 NOTE — Letter (Signed)
[facility]   Imported By: Sherian Rein 12/24/2009 12:07:29  _____________________________________________________________________  External Attachment:    Type:   Image     Comment:   External Document

## 2010-09-11 NOTE — Progress Notes (Signed)
Summary: OV? ABX?   Phone Note Call from Patient   Caller: Jasmine December - dtr [phone] till 1pm then [phone] Summary of Call: Pt continues to have sinus congestion & body aches. Daugther is worried and thinks she may need refill of antibiotic?  Initial call taken by: Lamar Sprinkles, CMA,  July 25, 2010 10:49 AM  Follow-up for Phone Call        1. if pt with fever, purulent sputum or nasal drainage will need CBCD and ov today 2. if pt with NO fever or purulent drainage can redo Rx for azithromycin 500mg  once daily x 3 days.  Follow-up by: Jacques Navy MD,  July 25, 2010 1:12 PM  Additional Follow-up for Phone Call Additional follow up Details #1::        SPoke w/daughter, unsure if fever or purulent drainage, will check w/pt & call me back............Marland KitchenLamar Sprinkles, CMA  July 25, 2010 2:04 PM   No fever, drainage is light yellow. Sent in rx for antibiotic.  Additional Follow-up by: Lamar Sprinkles, CMA,  July 25, 2010 2:13 PM    Prescriptions: AZITHROMYCIN 500 MG TABS (AZITHROMYCIN) 1 by mouth once daily x 3 days for URI.  #3 x 0   Entered by:   Lamar Sprinkles, CMA   Authorized by:   Jacques Navy MD   Signed by:   Lamar Sprinkles, CMA on 07/25/2010   Method used:   Electronically to        [facility]  [phone]* (retail)       [address]       [place], [place]  [address]       Ph: [phone] or [phone]       Fax: [phone]   RxID:   [id number]

## 2010-09-11 NOTE — Assessment & Plan Note (Signed)
Summary: FU LAB RESULT/PER SARAH/PN   Vital Signs:  Patient profile:   77 year old female Height:      64.5 inches Weight:      131.50 pounds BMI:     22.30 O2 Sat:      92 % on Room air Temp:     98.1 degrees F oral Pulse rate:   81 / minute BP sitting:   122 / 48  (left arm) Cuff size:   regular  Vitals Entered By: Zella Ball Ewing CMA (AAMA) (February 13, 2010 11:28 AM)  O2 Flow:  Room air CC: followup on labs/RE   Primary Care Provider:  Norins  CC:  followup on labs/RE.  History of Present Illness: here with feeling ill for approx 1 wk with nausea (no vomiting) ,  no abd pain, no diarrhea/constipation; no BRBPR, last colonoscopy mar 2011;  today with general weakness and sob/doe;  normally walks with cane; felt dizzy and like she might pass out with ambulating today to an arrange cbc here at the office lab;  could only walk with great difficulty for approx 30 ft;  also with some increaed leg swelling;    has hx of chronic anemia long stangind, last transufusion mar 2011; approx one yr ago with nadir hgb approx 5;  followed per hematology; stool is black but she takes iron.    no fever, Pt denies CP, wheezing, orthopnea, pnd, palps,  syncope .  Did have some mild chest congestion starting yesteray with minor cough, non prod , no chills.    Problems Prior to Update: 1)  Diarrhea  (ICD-787.91) 2)  Sinusitis - Acute-nos  (ICD-461.9) 3)  Inguinal Herniorrhaphy, Right, Hx of  (ICD-V45.89) 4)  Mitral Valve Insuff&aortic Valve Insuff  (ICD-396.3) 5)  Hyperlipidemia-mixed  (ICD-272.4) 6)  Other and Unspecified Hyperlipidemia  (ICD-272.4) 7)  Pneumonia, Hx of  (ICD-V12.60) 8)  Osteoarthritis  (ICD-715.90) 9)  Nephrolithiasis, Hx of  (ICD-V13.01) 10)  Hypertension  (ICD-401.9) 11)  Diabetes Mellitus, Type II  (ICD-250.00) 12)  Coronary Artery Disease  (ICD-414.00) 13)  Congestive Heart Failure  (ICD-428.0) 14)  Atrial Fibrillation  (ICD-427.31) 15)  Asthma  (ICD-493.90) 16)   Anemia-nos  (ICD-285.9) 17)  Allergic Rhinitis  (ICD-477.9)  Medications Prior to Update: 1)  Ferrous Gluconate 325 Mg Tabs (Ferrous Gluconate) .Marland Kitchen.. 1 Two Times A Day 2)  Metoprolol Tartrate 50 Mg Tabs (Metoprolol Tartrate) .Marland Kitchen.. 1 Tab Two Times A Day 3)  Lanoxin 0.125 Mg  Tabs (Digoxin) .... Take 1 Tablet By Mouth Once A Day 4)  Furosemide 40 Mg  Tabs (Furosemide) .... Take 1 Tablet By Mouth Two Times A Day 5)  Amlodipine Besylate 10 Mg  Tabs (Amlodipine Besylate) .... Take 1 Tablet By Mouth Once A Day 6)  Metformin Hcl 500 Mg  Tb24 (Metformin Hcl) .... Take 1 Tablet By Mouth Once A Day 7)  Avapro 300 Mg Tabs (Irbesartan) .Marland Kitchen.. 1 Once Daily 8)  Fexofenadine Hcl 180 Mg Tabs (Fexofenadine Hcl) .... As Needed 9)  Simvastatin 20 Mg  Tabs (Simvastatin) .... Take 1 Tablet By Mouth Once A Day 10)  Doxazosin Mesylate 8 Mg  Tabs (Doxazosin Mesylate) .... Take 1 Tab By Mouth At Bedtime 11)  Trazodone Hcl 50 Mg Tabs (Trazodone Hcl) .Marland Kitchen.. 1 To 2 Tabs At Bedtime As Needed 12)  Clonazepam 0.5 Mg Tabs (Clonazepam) .Marland Kitchen.. 1 Tab Two Times A Day 13)  Metronidazole 500 Mg Tabs (Metronidazole) .Marland Kitchen.. 1 By Mouth Three Times A Day X  10days For Possible C. Diff. 14)  Requip 0.5 Mg Tabs (Ropinirole Hcl) .... 1/2 At Bedtime X 6 Days Then 1 Tab At Bedtime X 10 Days Then Contact Office With Update 15)  Temazepam 15 Mg Caps (Temazepam) .Marland Kitchen.. 1 At Bedtime 16)  Hydrochlorothiazide 25 Mg Tabs (Hydrochlorothiazide) .Marland Kitchen.. 1 Once Daily  Current Medications (verified): 1)  Ferrous Gluconate 325 Mg Tabs (Ferrous Gluconate) .Marland Kitchen.. 1 Two Times A Day 2)  Metoprolol Tartrate 50 Mg Tabs (Metoprolol Tartrate) .Marland Kitchen.. 1 Tab Two Times A Day 3)  Lanoxin 0.125 Mg  Tabs (Digoxin) .... Take 1 Tablet By Mouth Once A Day 4)  Furosemide 40 Mg  Tabs (Furosemide) .... Take 1 Tablet By Mouth Two Times A Day 5)  Amlodipine Besylate 10 Mg  Tabs (Amlodipine Besylate) .... Take 1 Tablet By Mouth Once A Day 6)  Metformin Hcl 500 Mg  Tb24 (Metformin Hcl) ....  Take 1 Tablet By Mouth Once A Day 7)  Avapro 300 Mg Tabs (Irbesartan) .Marland Kitchen.. 1 Once Daily 8)  Fexofenadine Hcl 180 Mg Tabs (Fexofenadine Hcl) .... As Needed 9)  Simvastatin 20 Mg  Tabs (Simvastatin) .... Take 1 Tablet By Mouth Once A Day 10)  Doxazosin Mesylate 8 Mg  Tabs (Doxazosin Mesylate) .... Take 1 Tab By Mouth At Bedtime 11)  Trazodone Hcl 50 Mg Tabs (Trazodone Hcl) .Marland Kitchen.. 1 To 2 Tabs At Bedtime As Needed 12)  Clonazepam 0.5 Mg Tabs (Clonazepam) .Marland Kitchen.. 1 Tab Two Times A Day 13)  Metronidazole 500 Mg Tabs (Metronidazole) .Marland Kitchen.. 1 By Mouth Three Times A Day X 10days For Possible C. Diff. 14)  Requip 0.5 Mg Tabs (Ropinirole Hcl) .... 1/2 At Bedtime X 6 Days Then 1 Tab At Bedtime X 10 Days Then Contact Office With Update 15)  Temazepam 15 Mg Caps (Temazepam) .Marland Kitchen.. 1 At Bedtime 16)  Hydrochlorothiazide 25 Mg Tabs (Hydrochlorothiazide) .Marland Kitchen.. 1 Once Daily  Allergies (verified): 1)  ! Codeine  Past History:  Past Surgical History: Last updated: 05/28/2009 breast cysts w/ aspiration x 50 Hysterectomy 1990 Lumbar fusion'07 [facility]) Total hip replacement-right Valentina Gu) June 2008 Right inquinal hernia repair '10 ([facility])  Family History: Last updated: 2009-05-15 Father:decease CHF Mother:decease pneumonia  Siblings:1 brother good health  Social History: Last updated: 09/27/2008 married '42 -38 years-widowed 1 son -'11, 1 daughter '69 Lives alone - I-ADLs, including driving End-of-Life issues: no prolonged mechanical vent. support. Laymen's Guide to Death With Dignity provided with a suggestion that she discuss these issues with her family. Her son was supportive of having this discussion. 4 grandchildren Tobacco Use - No.  Alcohol Use - no Regular Exercise - no  Risk Factors: Caffeine Use: 1 (02/03/2008) Exercise: no (02/03/2008)  Risk Factors: Smoking Status: never (09/27/2008) Passive Smoke Exposure: no (02/03/2008)  Past Medical History: INGUINAL HERNIORRHAPHY, RIGHT, HX OF  (ICD-V45.89) MITRAL VALVE INSUFF&AORTIC VALVE INSUFF (ICD-396.3) HYPERLIPIDEMIA-MIXED (ICD-272.4) OTHER AND UNSPECIFIED HYPERLIPIDEMIA (ICD-272.4) PNEUMONIA, HX OF (ICD-V12.60) OSTEOARTHRITIS (ICD-715.90) NEPHROLITHIASIS, HX OF (ICD-V13.01) HYPERTENSION (ICD-401.9) DIABETES MELLITUS, TYPE II (ICD-250.00) CORONARY ARTERY DISEASE (ICD-414.00) CONGESTIVE HEART FAILURE (ICD-428.0) ATRIAL FIBRILLATION (ICD-427.31) ASTHMA (ICD-493.90) ANEMIA-NOS (ICD-285.9) ALLERGIC RHINITIS (ICD-477.9)  Review of Systems       all otherwise negative per pt -    Physical Exam  General:  alert and underweight appearing.   Head:  normocephalic and atraumatic.   Eyes:  vision grossly intact, pupils equal, and pupils round.   Ears:  R ear normal and L ear normal.   Nose:  no external deformity and no nasal discharge.  Mouth:  no gingival abnormalities and pharynx pink and moist.   Neck:  supple and no masses.   Lungs:  normal respiratory effort and normal breath sounds.   Heart:  normal rate and irregular rhythm.   Abdomen:  soft, non-tender, and normal bowel sounds.   Extremities:  trace edema bilat   Impression & Recommendations:  Problem # 1:  ANEMIA-NOS (ICD-285.9)  Her updated medication list for this problem includes:    Ferrous Gluconate 325 Mg Tabs (Ferrous gluconate) .Marland Kitchen... 1 two times a day symptomatic and high risk for fall -  for admit for PRBC's overnight  Problem # 2:  CONGESTIVE HEART FAILURE (ICD-428.0)  Her updated medication list for this problem includes:    Metoprolol Tartrate 50 Mg Tabs (Metoprolol tartrate) .Marland Kitchen... 1 tab two times a day    Lanoxin 0.125 Mg Tabs (Digoxin) .Marland Kitchen... Take 1 tablet by mouth once a day    Furosemide 40 Mg Tabs (Furosemide) .Marland Kitchen... Take 1 tablet by mouth two times a day    Avapro 300 Mg Tabs (Irbesartan) .Marland Kitchen... 1 once daily    Hydrochlorothiazide 25 Mg Tabs (Hydrochlorothiazide) .Marland Kitchen... 1 once daily stable overall by hx and exam, ok to continue meds/tx  as is   Problem # 3:  ATRIAL FIBRILLATION (ICD-427.31)  Her updated medication list for this problem includes:    Metoprolol Tartrate 50 Mg Tabs (Metoprolol tartrate) .Marland Kitchen... 1 tab two times a day    Lanoxin 0.125 Mg Tabs (Digoxin) .Marland Kitchen... Take 1 tablet by mouth once a day    Amlodipine Besylate 10 Mg Tabs (Amlodipine besylate) .Marland Kitchen... Take 1 tablet by mouth once a day stable overall by hx and exam, ok to continue meds/tx as is   Complete Medication List: 1)  Ferrous Gluconate 325 Mg Tabs (Ferrous gluconate) .Marland Kitchen.. 1 two times a day 2)  Metoprolol Tartrate 50 Mg Tabs (Metoprolol tartrate) .Marland Kitchen.. 1 tab two times a day 3)  Lanoxin 0.125 Mg Tabs (Digoxin) .... Take 1 tablet by mouth once a day 4)  Furosemide 40 Mg Tabs (Furosemide) .... Take 1 tablet by mouth two times a day 5)  Amlodipine Besylate 10 Mg Tabs (Amlodipine besylate) .... Take 1 tablet by mouth once a day 6)  Metformin Hcl 500 Mg Tb24 (Metformin hcl) .... Take 1 tablet by mouth once a day 7)  Avapro 300 Mg Tabs (Irbesartan) .Marland Kitchen.. 1 once daily 8)  Fexofenadine Hcl 180 Mg Tabs (Fexofenadine hcl) .... As needed 9)  Simvastatin 20 Mg Tabs (Simvastatin) .... Take 1 tablet by mouth once a day 10)  Doxazosin Mesylate 8 Mg Tabs (Doxazosin mesylate) .... Take 1 tab by mouth at bedtime 11)  Trazodone Hcl 50 Mg Tabs (Trazodone hcl) .Marland Kitchen.. 1 to 2 tabs at bedtime as needed 12)  Clonazepam 0.5 Mg Tabs (Clonazepam) .Marland Kitchen.. 1 tab two times a day 13)  Metronidazole 500 Mg Tabs (Metronidazole) .Marland Kitchen.. 1 by mouth three times a day x 10days for possible c. diff. 14)  Requip 0.5 Mg Tabs (Ropinirole hcl) .... 1/2 at bedtime x 6 days then 1 tab at bedtime x 10 days then contact office with update 15)  Temazepam 15 Mg Caps (Temazepam) .Marland Kitchen.. 1 at bedtime 16)  Hydrochlorothiazide 25 Mg Tabs (Hydrochlorothiazide) .Marland Kitchen.. 1 once daily  Patient Instructions: 1)  you are being admitted to the [facility] hosp for blood transfusion 2)  Continue all previous medications as before  this visit  3)  Please schedule an appointment with your primary doctor as needed  Appended Document: FU LAB RESULT/PER SARAH/PN spoke to hosp daytime admitter - pt is assigned to triad team 3; we will call for bed

## 2010-09-11 NOTE — Progress Notes (Signed)
  Phone Note Refill Request Message from:  Fax from Pharmacy on March 12, 2010 9:54 AM  Refills Requested: Medication #1:  TEMAZEPAM 15 MG CAPS 1 at bedtime Promethazine as well/ please Advise refill  Initial call taken by: Ami Bullins CMA,  March 12, 2010 9:55 AM  Follow-up for Phone Call        ok for refill x 5  Follow-up by: Jacques Navy MD,  March 12, 2010 3:35 PM    Prescriptions: TEMAZEPAM 15 MG CAPS (TEMAZEPAM) 1 at bedtime  #30 x 5   Entered by:   Ami Bullins CMA   Authorized by:   Jacques Navy MD   Signed by:   Bill Salinas CMA on 03/12/2010   Method used:   Telephoned to ...       [facility]  [address]  [phone]* (retail)       [address]       [place], [place]  [address]       Ph: [phone] or [phone]       Fax: [phone]   RxID:   [id number]

## 2010-09-11 NOTE — Progress Notes (Signed)
  Phone Note Other Incoming   Caller: [name]- Pt's son [phone] Summary of Call: [name] called regarding his mother. He states since she was discharged from the hospital that she has been having increased nausea. He would like something for her nausea called into [facility] on [address]. Please Advise Initial call taken by: Ami Bullins CMA,  September 04, 2009 2:50 PM  Follow-up for Phone Call        called son: [name] is eating dinner and doing ok but has had some nausea w/o emesis.  Plan - promethazine 12.5 mg by mouth q6 as needed, #12, 1 refill Follow-up by: Jacques Navy MD,  September 04, 2009 5:39 PM    New/Updated Medications: PROMETHAZINE HCL 12.5 MG TABS (PROMETHAZINE HCL) 1 by mouth q6 as needed nausea Prescriptions: PROMETHAZINE HCL 12.5 MG TABS (PROMETHAZINE HCL) 1 by mouth q6 as needed nausea  #12 x 1   Entered and Authorized by:   Jacques Navy MD   Signed by:   Jacques Navy MD on 09/04/2009   Method used:   Electronically to        [facility]  [phone]* (retail)       [address]       [place], [place]  [address]       Ph: [phone] or [phone]       Fax: [phone]   RxID:   [id number]

## 2010-09-11 NOTE — Miscellaneous (Signed)
Summary: Order for extend PT/[facility]  Order for extend PT/[facility]   Imported By: Sherian Rein 10/24/2009 13:10:39  _____________________________________________________________________  External Attachment:    Type:   Image     Comment:   External Document

## 2010-09-11 NOTE — Progress Notes (Signed)
Summary: [name] pt - lab request  Phone Note Call from Patient   Caller: Son [name] - [phone] Summary of Call: Pt c/o nausea and "overall not feeling well". Previously these were pt's symptoms of low Hgb. Son is requesting to have labs to check. Please advise.  Initial call taken by: Lamar Sprinkles, CMA,  February 11, 2010 1:30 PM  Follow-up for Phone Call        ok for cbc  - 285.9; needs OV for persistent or worsening symtpoms Follow-up by: Corwin Levins MD,  February 11, 2010 1:39 PM  Additional Follow-up for Phone Call Additional follow up Details #1::        Order in IDX, left mess for pt's son to call back..............Marland KitchenLamar Sprinkles, CMA  February 11, 2010 5:54 PM   pt's son informed.................Marland KitchenLamar Sprinkles, CMA  February 12, 2010 2:40 PM

## 2010-09-11 NOTE — Progress Notes (Signed)
Summary: [name]  Phone Note From Other Clinic   Caller: [name] [phone] Summary of Call: [facility] physical therapist is req verbal ok to continue services.  Initial call taken by: Lamar Sprinkles, CMA,  October 28, 2009 11:11 AM  Follow-up for Phone Call        k Follow-up by: Jacques Navy MD,  October 28, 2009 1:00 PM  Additional Follow-up for Phone Call Additional follow up Details #1::        Verbal given  Additional Follow-up by: Lamar Sprinkles, CMA,  October 28, 2009 1:45 PM

## 2010-09-11 NOTE — Procedures (Signed)
Summary: Colonoscopy  Patient: [name] Note: All result statuses are Final unless otherwise noted.  Tests: (1) Colonoscopy (COL)   COL Colonoscopy           DONE     [facility]     [address]     [place], [place]  [address]           COLONOSCOPY PROCEDURE REPORT           PATIENT:  [name], [name]  MR#:  [id number]     BIRTHDATE:  [birth year], 89 yrs. old  GENDER:  female           ENDOSCOPIST:  Judie Petit T. Russella Dar, MD, [facility]           Referred by:  Illene Regulus, MD           PROCEDURE DATE:  10/11/2009     PROCEDURE:  Colonoscopy with snare polypectomy     ASA CLASS:  Class III     INDICATIONS:  1) excision of known colonic polyps  2) anemia           MEDICATIONS:   Fentanyl 50 mcg IV, Versed 5 mg IV           DESCRIPTION OF PROCEDURE:   After the risks benefits and     alternatives of the procedure were thoroughly explained, informed     consent was obtained.  Digital rectal exam was performed and     revealed no abnormalities.   The EC-[id number] ([id number]) endoscope was     introduced through the anus and advanced to the cecum, which was     identified by both the appendix and ileocecal valve, without     limitations.  The quality of the prep was good, using Colyte.  The     instrument was then slowly withdrawn as the colon was fully     examined.     <<PROCEDUREIMAGES>>           FINDINGS:  Two polyps were found in the mid transverse colon. They     were 4 - 5 mm in size. Polyps were snared without cautery.     Retrieval was successful.  A pedunculated polyp was found in the     sigmoid colon. It was 10 mm in size. Polyp was snared, then     cauterized with monopolar cautery. Retrieval was successful. This     was otherwise a normal examination of the colon.   Retroflexed     views in the rectum revealed internal hemorrhoids.    The time to     cecum =  3  minutes. The scope was then withdrawn (time =  12     min) from the patient and the  procedure completed.           COMPLICATIONS:  None           ENDOSCOPIC IMPRESSION:     1) 4 - 5 mm Two polyps in the mid transverse colon     2) 10 mm pedunculated polyp in the sigmoid colon     3) Internal hemorrhoids           RECOMMENDATIONS:     1) No aspirin or NSAID's for 2 weeks     2) Await pathology results     3) No plans for future surveillance colonoscopies due to age and     comorbidities  Venita Lick. Russella Dar, MD, [facility]           n.     eSIGNED:   Venita Lick. Stark at 10/11/2009 09:06 AM           Gara Kroner, [id number]  Note: An exclamation mark (!) indicates a result that was not dispersed into the flowsheet. Document Creation Date: 10/11/2009 9:06 AM _______________________________________________________________________  (1) Order result status: Final Collection or observation date-time: 10/11/2009 08:57 Requested date-time:  Receipt date-time:  Reported date-time:  Referring Physician:   Ordering Physician: Claudette Head [id number]) Specimen Source:  Source: Launa Grill Order Number: [id number] Lab site:

## 2010-09-11 NOTE — Progress Notes (Signed)
Summary: ON CALL / Pt admitted  Phone Note Other Incoming   Summary of Call: CALL A NURSE REPORT: Pt c/o aches, dry cough, sob w/some chest discomfort. Pain and SOB were not relieved after rest or change in position nurse directed patient's son to call 911. On call note sent to be scanned into chart. left mess for Tammy Sours, pt's son to call office back with status of patient.  Initial call taken by: Lamar Sprinkles, CMA,  September 02, 2009 8:36 AM  Follow-up for Phone Call        Spoke with Dr Debby Bud, pt was admitted. Son returned call and spoke with Dr directly  Follow-up by: Lamar Sprinkles, CMA,  September 02, 2009 8:50 AM

## 2010-09-11 NOTE — Assessment & Plan Note (Signed)
Summary: rov/gd   Primary Provider:  Norins  CC:  check up.  History of Present Illness:  Unfortunately [name] is 77 years old has severe three-vessel disease, severe MR and a history of congestive heart failure. She has afib and is not on coumadin due to age and frailty.  At this point and age we are not providing a lot to her care and I will have her follow up with Dr Arthur Holms.  She has not had palpitations or TIA like symptoms.  She had urgent surgery for an incarcerated hernia and surprisingly did well with no cardiac complications.  She has had a right hip replacement but still has a bad left hip.  She has a chronic multifactorial anemia that has recquired iv iron and transfucstions.  She has had an elevated BNP with prerenal azotemia and is currently on Lasix with supplemental zaroxyln 3x/week.  Dr Arthur Holms is doing a good job balancing her volume status with fatiigue and pre-renal azotemia.  She is not a candidate for aggressive care or intervention.    Current Problems (verified): 1)  Diarrhea  (ICD-787.91) 2)  Sinusitis - Acute-nos  (ICD-461.9) 3)  Inguinal Herniorrhaphy, Right, Hx of  (ICD-V45.89) 4)  Mitral Valve Insuff&aortic Valve Insuff  (ICD-396.3) 5)  Hyperlipidemia-mixed  (ICD-272.4) 6)  Other and Unspecified Hyperlipidemia  (ICD-272.4) 7)  Pneumonia, Hx of  (ICD-V12.60) 8)  Osteoarthritis  (ICD-715.90) 9)  Nephrolithiasis, Hx of  (ICD-V13.01) 10)  Hypertension  (ICD-401.9) 11)  Diabetes Mellitus, Type II  (ICD-250.00) 12)  Coronary Artery Disease  (ICD-414.00) 13)  Congestive Heart Failure  (ICD-428.0) 14)  Atrial Fibrillation  (ICD-427.31) 15)  Asthma  (ICD-493.90) 16)  Anemia-nos  (ICD-285.9) 17)  Allergic Rhinitis  (ICD-477.9)  Current Medications (verified): 1)  Ferrous Gluconate 325 Mg Tabs (Ferrous Gluconate) .Marland Kitchen.. 1 Two Times A Day 2)  Metoprolol Tartrate 50 Mg Tabs (Metoprolol Tartrate) .Marland Kitchen.. 1 Tab Two Times A Day 3)  Lanoxin 0.125 Mg  Tabs (Digoxin) .... Take 1  Tablet By Mouth Once A Day 4)  Furosemide 40 Mg  Tabs (Furosemide) .... Take 1 Tablet By Mouth Two Times A Day 5)  Metformin Hcl 500 Mg  Tb24 (Metformin Hcl) .... Take 1 Tablet By Mouth Once A Day 6)  Avapro 300 Mg Tabs (Irbesartan) .Marland Kitchen.. 1 Once Daily 7)  Fexofenadine Hcl 180 Mg Tabs (Fexofenadine Hcl) .... As Needed 8)  Simvastatin 20 Mg  Tabs (Simvastatin) .... Take 1 Tablet By Mouth Once A Day 9)  Doxazosin Mesylate 4 Mg Tabs (Doxazosin Mesylate) .Marland Kitchen.. 1 By Mouth At Bedtime For Blood Pressure 10)  Trazodone Hcl 50 Mg Tabs (Trazodone Hcl) .Marland Kitchen.. 1 To 2 Tabs At Bedtime As Needed 11)  Clonazepam 0.5 Mg Tabs (Clonazepam) .Marland Kitchen.. 1 Tab Two Times A Day 12)  Temazepam 15 Mg Caps (Temazepam) .Marland Kitchen.. 1 At Bedtime 13)  K-Phos 500 Mg Tabs (Potassium Phosphate Monobasic) .... 2 in The Morning and 1 At Bedtime 14)  Metolazone 2.5 Mg Tabs (Metolazone) .Marland Kitchen.. 1 Tab M-W-F 30 Minutes Before Am Furosemide.  Allergies (verified): 1)  ! Codeine  Past History:  Past Medical History: Last updated: 02/13/2010 INGUINAL HERNIORRHAPHY, RIGHT, HX OF (ICD-V45.89) MITRAL VALVE INSUFF&AORTIC VALVE INSUFF (ICD-396.3) HYPERLIPIDEMIA-MIXED (ICD-272.4) OTHER AND UNSPECIFIED HYPERLIPIDEMIA (ICD-272.4) PNEUMONIA, HX OF (ICD-V12.60) OSTEOARTHRITIS (ICD-715.90) NEPHROLITHIASIS, HX OF (ICD-V13.01) HYPERTENSION (ICD-401.9) DIABETES MELLITUS, TYPE II (ICD-250.00) CORONARY ARTERY DISEASE (ICD-414.00) CONGESTIVE HEART FAILURE (ICD-428.0) ATRIAL FIBRILLATION (ICD-427.31) ASTHMA (ICD-493.90) ANEMIA-NOS (ICD-285.9) ALLERGIC RHINITIS (ICD-477.9)  Past Surgical History: Last updated: 05/28/2009 breast  cysts w/ aspiration x 50 Hysterectomy 1990 Lumbar fusion'07 [facility]ell Sheehan) Total hip replacement-right Valentina Gu) June 2008 Right inquinal hernia repair '10 ([facility])  Family History: Last updated: May 11, 2009 Father:decease CHF Mother:decease pneumonia  Siblings:1 brother good health  Social History: Last updated:  09/27/2008 married '42 -38 years-widowed 1 son -'61, 1 daughter '53 Lives alone - I-ADLs, including driving End-of-Life issues: no prolonged mechanical vent. support. Laymen's Guide to Death With Dignity provided with a suggestion that she discuss these issues with her family. Her son was supportive of having this discussion. 4 grandchildren Tobacco Use - No.  Alcohol Use - no Regular Exercise - no  Review of Systems       Denies fever, malais, weight loss, blurry vision, decreased visual acuity, cough, sputum,  hemoptysis, pleuritic pain, palpitaitons, heartburn, abdominal pain, melena, l, claudication, or rash.   Vital Signs:  Patient profile:   77 year old female Height:      64 inches Weight:      122 pounds BMI:     21.02 Pulse rate:   64 / minute Resp:     14 per minute BP sitting:   120 / 68  (left arm)  Vitals Entered By: Kem Parkinson (April 08, 2010 12:08 PM)  Physical Exam  General:  Affect appropriate Healthy:  appears stated age HEENT: normal Neck supple with no adenopathy JVP normal no bruits no thyromegaly Lungs clear with no wheezing and good diaphragmatic motion Heart:  S1/S2 MR  murmur,rub, gallop or click PMI normal Abdomen: benighn, BS positve, no tenderness, no AAA no bruit.  No HSM or HJR Distal pulses intact with no bruits Plus 2 bilateral  edema Neuro non-focal Skin warm and dry    Impression & Recommendations:  Problem # 1:  MITRAL VALVE INSUFF&AORTIC VALVE INSUFF (ICD-396.3) No change in murmur.  Volume best it can be  Not a surgical candidate  Problem # 2:  HYPERTENSION (ICD-401.9) Well controlled Her updated medication list for this problem includes:    Metoprolol Tartrate 50 Mg Tabs (Metoprolol tartrate) .Marland Kitchen... 1 tab two times a day    Furosemide 40 Mg Tabs (Furosemide) .Marland Kitchen... Take 1 tablet by mouth two times a day    Avapro 300 Mg Tabs (Irbesartan) .Marland Kitchen... 1 once daily    Doxazosin Mesylate 4 Mg Tabs (Doxazosin mesylate) .Marland Kitchen... 1  by mouth at bedtime for blood pressure    Metolazone 2.5 Mg Tabs (Metolazone) .Marland Kitchen... 1 tab m-w-f 30 minutes before am furosemide.  Problem # 3:  CORONARY ARTERY DISEASE (ICD-414.00) 3VD not amenable to PCI and not a CABG candidate.  Continue medical Rx Her updated medication list for this problem includes:    Metoprolol Tartrate 50 Mg Tabs (Metoprolol tartrate) .Marland Kitchen... 1 tab two times a day  Problem # 4:  ATRIAL FIBRILLATION (ICD-427.31) Good rate control.  No coumadin due to age and anemia Her updated medication list for this problem includes:    Metoprolol Tartrate 50 Mg Tabs (Metoprolol tartrate) .Marland Kitchen... 1 tab two times a day    Lanoxin 0.125 Mg Tabs (Digoxin) .Marland Kitchen... Take 1 tablet by mouth once a day  Problem # 5:  CONGESTIVE HEART FAILURE (ICD-428.0) Stable continue current dose of lasix and zaroxyln Her updated medication list for this problem includes:    Metoprolol Tartrate 50 Mg Tabs (Metoprolol tartrate) .Marland Kitchen... 1 tab two times a day    Lanoxin 0.125 Mg Tabs (Digoxin) .Marland Kitchen... Take 1 tablet by mouth once a day    Furosemide 40 Mg Tabs (  Furosemide) .Marland Kitchen... Take 1 tablet by mouth two times a day    Avapro 300 Mg Tabs (Irbesartan) .Marland Kitchen... 1 once daily    Metolazone 2.5 Mg Tabs (Metolazone) .Marland Kitchen... 1 tab m-w-f 30 minutes before am furosemide.

## 2010-09-11 NOTE — Miscellaneous (Signed)
Summary: D/C Summary/[facility]  D/C Summary/[facility]   Imported By: Sherian Rein 04/11/2010 09:07:28  _____________________________________________________________________  External Attachment:    Type:   Image     Comment:   External Document

## 2010-09-11 NOTE — Assessment & Plan Note (Signed)
Summary: rov per son call/lg   Primary Provider:  Norins  CC:  pt complains of nausea sob and left side of head hurts..pt takes tylenol which helps.  History of Present Illness:  Unfortunately [name] is 77 years old has severe three-vessel disease, severe MR and a history of congestive heart failure. She has afib and is not on coumadin due to age and frailty.  At this point and age we are not providing a lot to her care and I will have her follow up with Dr Arthur Holms.  She has not had palpitations or TIA like symptoms.  .  She has had a right hip replacement but still has a bad left hip. She is seeing Dr Valentina Gu to see about some injections as this affects her quality of life greatly  She has a chronic multifactorial anemia that has recquired iv iron and transfucstions.  She has had an elevated BNP with prerenal azotemia and is currently on Lasix with supplemental zaroxyln 3x/week.  Dr Arthur Holms is doing a good job balancing her volume status with fatiigue and pre-renal azotemia.  She is not a candidate for aggressive care or intervention.   I suspect she will be a palliateve care patient within a year.  She does not need cardiology F/U  Current Problems (verified): 1)  Uri  (ICD-465.9) 2)  Memory Loss  (ICD-780.93) 3)  Foot Pain, Right  (ICD-729.5) 4)  Diarrhea  (ICD-787.91) 5)  Sinusitis - Acute-nos  (ICD-461.9) 6)  Inguinal Herniorrhaphy, Right, Hx of  (ICD-V45.89) 7)  Mitral Valve Insuff&aortic Valve Insuff  (ICD-396.3) 8)  Hyperlipidemia-mixed  (ICD-272.4) 9)  Other and Unspecified Hyperlipidemia  (ICD-272.4) 10)  Pneumonia, Hx of  (ICD-V12.60) 11)  Osteoarthritis  (ICD-715.90) 12)  Nephrolithiasis, Hx of  (ICD-V13.01) 13)  Hypertension  (ICD-401.9) 14)  Diabetes Mellitus, Type II  (ICD-250.00) 15)  Coronary Artery Disease  (ICD-414.00) 16)  Congestive Heart Failure  (ICD-428.0) 17)  Atrial Fibrillation  (ICD-427.31) 18)  Asthma  (ICD-493.90) 19)  Anemia-nos  (ICD-285.9) 20)  Allergic  Rhinitis  (ICD-477.9)  Current Medications (verified): 1)  Ferrous Gluconate 325 Mg Tabs (Ferrous Gluconate) .Marland Kitchen.. 1 Two Times A Day 2)  Metoprolol Tartrate 50 Mg Tabs (Metoprolol Tartrate) .Marland Kitchen.. 1 Tab Two Times A Day 3)  Lanoxin 0.125 Mg  Tabs (Digoxin) .... Take 1 Tablet By Mouth Once A Day 4)  Furosemide 40 Mg  Tabs (Furosemide) .... Take 1 Tablet By Mouth Two Times A Day 5)  Metformin Hcl 500 Mg  Tb24 (Metformin Hcl) .... Take 1 Tablet By Mouth Once A Day 6)  Avapro 300 Mg Tabs (Irbesartan) .Marland Kitchen.. 1 Once Daily 7)  Fexofenadine Hcl 180 Mg Tabs (Fexofenadine Hcl) .... As Needed 8)  Simvastatin 20 Mg  Tabs (Simvastatin) .... Take 1 Tablet By Mouth Once A Day 9)  Doxazosin Mesylate 4 Mg Tabs (Doxazosin Mesylate) .Marland Kitchen.. 1 By Mouth At Bedtime For Blood Pressure 10)  Trazodone Hcl 50 Mg Tabs (Trazodone Hcl) .Marland Kitchen.. 1 To 2 Tabs At Bedtime As Needed 11)  Clonazepam 0.5 Mg Tabs (Clonazepam) .Marland Kitchen.. 1 Tab Two Times A Day 12)  Temazepam 15 Mg Caps (Temazepam) .Marland Kitchen.. 1 At Bedtime 13)  K-Phos 500 Mg Tabs (Potassium Phosphate Monobasic) .Marland Kitchen.. 1 Tab By Mouth Two Times A Day 14)  Metolazone 2.5 Mg Tabs (Metolazone) .Marland Kitchen.. 1 Tab M-W-F 30 Minutes Before Am Furosemide. 15)  Aleve 220 Mg Tabs (Naproxen Sodium) .Marland Kitchen.. 1 By Mouth Two Times A Day 16)  Nexium 40 Mg Cpdr (Esomeprazole  Magnesium) .... Take 1 Every Morning  Allergies (verified): 1)  ! Codeine  Past History:  Past Medical History: Last updated: 02/13/2010 INGUINAL HERNIORRHAPHY, RIGHT, HX OF (ICD-V45.89) MITRAL VALVE INSUFF&AORTIC VALVE INSUFF (ICD-396.3) HYPERLIPIDEMIA-MIXED (ICD-272.4) OTHER AND UNSPECIFIED HYPERLIPIDEMIA (ICD-272.4) PNEUMONIA, HX OF (ICD-V12.60) OSTEOARTHRITIS (ICD-715.90) NEPHROLITHIASIS, HX OF (ICD-V13.01) HYPERTENSION (ICD-401.9) DIABETES MELLITUS, TYPE II (ICD-250.00) CORONARY ARTERY DISEASE (ICD-414.00) CONGESTIVE HEART FAILURE (ICD-428.0) ATRIAL FIBRILLATION (ICD-427.31) ASTHMA (ICD-493.90) ANEMIA-NOS (ICD-285.9) ALLERGIC  RHINITIS (ICD-477.9)  Past Surgical History: Last updated: 05/28/2009 breast cysts w/ aspiration x 50 Hysterectomy 1990 Lumbar fusion'07 Lovell Sheehan) Total hip replacement-right Valentina Gu) June 2008 Right inquinal hernia repair '10 ([facility])  Family History: Last updated: 2009-05-01 Father:decease CHF Mother:decease pneumonia  Siblings:1 brother good health  Social History: Last updated: 09/27/2008 married '42 -38 years-widowed 1 son -'19, 1 daughter '42 Lives alone - I-ADLs, including driving End-of-Life issues: no prolonged mechanical vent. support. Laymen's Guide to Death With Dignity provided with a suggestion that she discuss these issues with her family. Her son was supportive of having this discussion. 4 grandchildren Tobacco Use - No.  Alcohol Use - no Regular Exercise - no  Review of Systems       Denies fever, malais, weight loss, blurry vision, decreased visual acuity, cough, sputum, SOB, hemoptysis, pleuritic pain, palpitaitons, heartburn, abdominal pain, melena, lower extremity edema, claudication, or rash.   Vital Signs:  Patient profile:   77 year old female Height:      64 inches Weight:      136 pounds BMI:     23.43 Pulse rate:   88 / minute Resp:     14 per minute BP sitting:   93 / 41  (left arm)  Vitals Entered By: Kem Parkinson (September 04, 2010 11:41 AM)  Physical Exam  General:  Depressed frail elderly female HEENT: normal Neck supple with no adenopathy JVP normal no bruits no thyromegaly Lungs clear with no wheezing and good diaphragmatic motion Heart:  S1/S2 MR  murmur,rub, gallop or click PMI normal Abdomen: benighn, BS positve, no tenderness, no AAA no bruit.  No HSM or HJR Distal pulses intact with no bruits No edema Neuro non-focal Skin warm and dry    Impression & Recommendations:  Problem # 1:  MITRAL VALVE INSUFF&AORTIC VALVE INSUFF (ICD-396.3) Not an operative candidate.  No congestive CHF  Problem # 2:   HYPERLIPIDEMIA-MIXED (ICD-272.4) Continue statin although at her age no data to support.   Her updated medication list for this problem includes:    Simvastatin 20 Mg Tabs (Simvastatin) .Marland Kitchen... Take 1 tablet by mouth once a day  Problem # 3:  HYPERTENSION (ICD-401.9) Well controlled Her updated medication list for this problem includes:    Metoprolol Tartrate 50 Mg Tabs (Metoprolol tartrate) .Marland Kitchen... 1 tab two times a day    Furosemide 40 Mg Tabs (Furosemide) .Marland Kitchen... Take 1 tablet by mouth two times a day    Avapro 300 Mg Tabs (Irbesartan) .Marland Kitchen... 1 once daily    Doxazosin Mesylate 4 Mg Tabs (Doxazosin mesylate) .Marland Kitchen... 1 by mouth at bedtime for blood pressure    Metolazone 2.5 Mg Tabs (Metolazone) .Marland Kitchen... 1 tab m-w-f 30 minutes before am furosemide.  Problem # 4:  CORONARY ARTERY DISEASE (ICD-414.00) Stable no angina Her updated medication list for this problem includes:    Metoprolol Tartrate 50 Mg Tabs (Metoprolol tartrate) .Marland Kitchen... 1 tab two times a day  Problem # 5:  ATRIAL FIBRILLATION (ICD-427.31) Good rate contol not a coumadin candidate Her updated medication list for  this problem includes:    Metoprolol Tartrate 50 Mg Tabs (Metoprolol tartrate) .Marland Kitchen... 1 tab two times a day    Lanoxin 0.125 Mg Tabs (Digoxin) .Marland Kitchen... Take 1 tablet by mouth once a day

## 2010-09-11 NOTE — Progress Notes (Signed)
Summary: STILL SICK  Phone Note Call from Patient   Caller: Jasmine December - DTR [phone] or [phone] after 1pm Summary of Call: Pt's daugther called, pt is not any better, she did take the second antibiotic. Pt is having swelling in legs that is secreting some fluid BUT daughter states this is not abnormal for pt. She is very worried this will turn into pneumonia. Does pt need re-eval in the office?  Initial call taken by: Lamar Sprinkles, CMA,  July 28, 2010 11:21 AM  Follow-up for Phone Call        2 rounds of antibiotic! definitely needs follow-up ov with CBCD-stat and CXR prior to visit-465.9   Also a stat metabolic panel 995.20 Follow-up by: Jacques Navy MD,  July 28, 2010 11:30 AM  Additional Follow-up for Phone Call Additional follow up Details #1::        Daugther informed, scheduled for office visit tomorrow am & cxr prior. Pt had labs at Dr Sharlene Dory last week, will call to get results & then call dtr back if pt needs labs prior.  Additional Follow-up by: Lamar Sprinkles, CMA,  July 28, 2010 12:33 PM    Additional Follow-up for Phone Call Additional follow up Details #2::    Unable to get anyone on phone w/Dr Gwenyth Bouillon who could give me answer as to what labs pt had. Pt will have stat labs tomorro am.  Follow-up by: Lamar Sprinkles, CMA,  July 28, 2010 6:05 PM

## 2010-09-11 NOTE — Assessment & Plan Note (Signed)
Summary: NOT FEELING WELL/ NAUSEA/ MAY NEED LABS PER SON/NWS   Vital Signs:  Patient profile:   77 year old female Height:      64.5 inches (163.83 cm) Weight:      122.25 pounds (55.57 kg) BMI:     20.73 O2 Sat:      96 % on Room air Temp:     97.6 degrees F (36.44 degrees C) oral Pulse rate:   62 / minute BP sitting:   126 / 60  (left arm) Cuff size:   regular  Vitals Entered By: Lucious Groves CMA (March 20, 2010 2:36 PM)  O2 Flow:  Room air CC: C/O nausea, anemia, and fatigue./kb Is Patient Diabetic? No Pain Assessment Patient in pain? no        Primary Care Sora Vrooman:  Norins  CC:  C/O nausea, anemia, and and fatigue./kb.  History of Present Illness: Patient presents due to increased weakness and malaise. she has also had mild increased shortness of breath. Her son is with her and reports that she has been staying in the bed for the last week. These are the same symptoms she has had when anemic. Her peripheral edema is resolved and she has continued on metolazone MWF and furosemide daily.  Current Medications (verified): 1)  Ferrous Gluconate 325 Mg Tabs (Ferrous Gluconate) .Marland Kitchen.. 1 Two Times A Day 2)  Metoprolol Tartrate 50 Mg Tabs (Metoprolol Tartrate) .Marland Kitchen.. 1 Tab Two Times A Day 3)  Lanoxin 0.125 Mg  Tabs (Digoxin) .... Take 1 Tablet By Mouth Once A Day 4)  Furosemide 40 Mg  Tabs (Furosemide) .... Take 1 Tablet By Mouth Two Times A Day 5)  Metformin Hcl 500 Mg  Tb24 (Metformin Hcl) .... Take 1 Tablet By Mouth Once A Day 6)  Avapro 300 Mg Tabs (Irbesartan) .Marland Kitchen.. 1 Once Daily 7)  Fexofenadine Hcl 180 Mg Tabs (Fexofenadine Hcl) .... As Needed 8)  Simvastatin 20 Mg  Tabs (Simvastatin) .... Take 1 Tablet By Mouth Once A Day 9)  Doxazosin Mesylate 4 Mg Tabs (Doxazosin Mesylate) .Marland Kitchen.. 1 By Mouth At Bedtime For Blood Pressure 10)  Trazodone Hcl 50 Mg Tabs (Trazodone Hcl) .Marland Kitchen.. 1 To 2 Tabs At Bedtime As Needed 11)  Clonazepam 0.5 Mg Tabs (Clonazepam) .Marland Kitchen.. 1 Tab Two Times A Day 12)   Temazepam 15 Mg Caps (Temazepam) .Marland Kitchen.. 1 At Bedtime 13)  Klor-Con 20 Meq Pack (Potassium Chloride) .Marland Kitchen.. 1 Once Daily 14)  Metolazone 2.5 Mg Tabs (Metolazone) .Marland Kitchen.. 1 Tab M-W-F 30 Minutes Before Am Furosemide.  Allergies (verified): 1)  ! Codeine  Past History:  Past Medical History: Last updated: 02/13/2010 INGUINAL HERNIORRHAPHY, RIGHT, HX OF (ICD-V45.89) MITRAL VALVE INSUFF&AORTIC VALVE INSUFF (ICD-396.3) HYPERLIPIDEMIA-MIXED (ICD-272.4) OTHER AND UNSPECIFIED HYPERLIPIDEMIA (ICD-272.4) PNEUMONIA, HX OF (ICD-V12.60) OSTEOARTHRITIS (ICD-715.90) NEPHROLITHIASIS, HX OF (ICD-V13.01) HYPERTENSION (ICD-401.9) DIABETES MELLITUS, TYPE II (ICD-250.00) CORONARY ARTERY DISEASE (ICD-414.00) CONGESTIVE HEART FAILURE (ICD-428.0) ATRIAL FIBRILLATION (ICD-427.31) ASTHMA (ICD-493.90) ANEMIA-NOS (ICD-285.9) ALLERGIC RHINITIS (ICD-477.9)  Past Surgical History: Last updated: 05/28/2009 breast cysts w/ aspiration x 50 Hysterectomy 1990 Lumbar fusion'07 Lovell Sheehan) Total hip replacement-right Valentina Gu) June 2008 Right inquinal hernia repair '10 ([facility]) PSH reviewed for relevance, FH reviewed for relevance  Review of Systems       The patient complains of dyspnea on exertion, muscle weakness, and difficulty walking.  The patient denies anorexia, fever, weight loss, weight gain, decreased hearing, chest pain, peripheral edema, prolonged cough, abdominal pain, melena, hematochezia, and enlarged lymph nodes.    Physical Exam  General:  Elderly white female - dressed to go out in no distress. Head:  Mild temporal wasting noted Eyes:  C&S clear Neck:  supple.   Lungs:  normal respiratory effort, normal breath sounds, no crackles, and no wheezes.   Heart:  normal rate and regular rhythm.   Abdomen:  soft, non-tender, and normal bowel sounds.   Neurologic:  alert & oriented X3 and cranial nerves II-XII intact.   Skin:  turgor normal and color normal.   Psych:  Oriented X3 and good eye contact.      Impression & Recommendations:  Problem # 1:  OTHER MALAISE AND FATIGUE (ICD-780.79) Patient with weakness and fatigue without focal complaints. Concern for recurrent anemia vs CHF  Plan - lab : CC, BNP, Bmet  Addendum - Hgb 11.9, BNP elevated 300+, K 3.2,   Plan - increase lasix to 2 tabs AM 1 Tab PM for 3 days          increase Potassium to two times a day.  Complete Medication List: 1)  Ferrous Gluconate 325 Mg Tabs (Ferrous gluconate) .Marland Kitchen.. 1 two times a day 2)  Metoprolol Tartrate 50 Mg Tabs (Metoprolol tartrate) .Marland Kitchen.. 1 tab two times a day 3)  Lanoxin 0.125 Mg Tabs (Digoxin) .... Take 1 tablet by mouth once a day 4)  Furosemide 40 Mg Tabs (Furosemide) .... Take 1 tablet by mouth two times a day 5)  Metformin Hcl 500 Mg Tb24 (Metformin hcl) .... Take 1 tablet by mouth once a day 6)  Avapro 300 Mg Tabs (Irbesartan) .Marland Kitchen.. 1 once daily 7)  Fexofenadine Hcl 180 Mg Tabs (Fexofenadine hcl) .... As needed 8)  Simvastatin 20 Mg Tabs (Simvastatin) .... Take 1 tablet by mouth once a day 9)  Doxazosin Mesylate 4 Mg Tabs (Doxazosin mesylate) .Marland Kitchen.. 1 by mouth at bedtime for blood pressure 10)  Trazodone Hcl 50 Mg Tabs (Trazodone hcl) .Marland Kitchen.. 1 to 2 tabs at bedtime as needed 11)  Clonazepam 0.5 Mg Tabs (Clonazepam) .Marland Kitchen.. 1 tab two times a day 12)  Temazepam 15 Mg Caps (Temazepam) .Marland Kitchen.. 1 at bedtime 13)  Klor-con 20 Meq Pack (Potassium chloride) .Marland Kitchen.. 1 once daily 14)  Metolazone 2.5 Mg Tabs (Metolazone) .Marland Kitchen.. 1 tab m-w-f 30 minutes before am furosemide.  Other Orders: TLB-CBC Platelet - w/Differential (85025-CBCD) TLB-BMP (Basic Metabolic Panel-BMET) (80048-METABOL) TLB-BNP (B-Natriuretic Peptide) (83880-BNPR)

## 2010-09-11 NOTE — Letter (Signed)
Summary: [facility]  [facility]   Imported By: Lester Mission 05/13/2010 08:24:35  _____________________________________________________________________  External Attachment:    Type:   Image     Comment:   External Document

## 2010-09-11 NOTE — Initial Assessments (Signed)
Summary: loss of appetitie,nausea/cd   Vital Signs:  Patient profile:   77 year old female O2 Sat:      99 % Temp:     97.6 degrees F oral Pulse rate:   66 / minute BP sitting:   100 / 60  (left arm) BP standing:   120 / 50  (left arm) Cuff size:   regular  Vitals Entered By: Lamar Sprinkles, CMA (September 05, 2010 4:39 PM) CC: nausea x 1 wk - no appetite/SD   Primary Care Provider:  Chadwick Reiswig  CC:  nausea x 1 wk - no appetite/SD.  History of Present Illness: [name] presents due to very poor by mouth intake. She has frequent nausea without emesis. The description is of an empty feeling in her stomac. There is a problem with mild dysphagia. NO episodes where she had to regurgitate reported. She does have early satiety. Her bowel habit has changed with looser stools with some increased frequency. She is taking nexium; aleve two times a day iron.    She did have a fall and since that time has had a burning in the left groin. she also has shoulder on the left. she has had restless legs.   Current Medications (verified): 1)  Ferrous Gluconate 325 Mg Tabs (Ferrous Gluconate) .Marland Kitchen.. 1 Two Times A Day 2)  Metoprolol Tartrate 50 Mg Tabs (Metoprolol Tartrate) .Marland Kitchen.. 1 Tab Two Times A Day 3)  Lanoxin 0.125 Mg  Tabs (Digoxin) .... Take 1 Tablet By Mouth Once A Day 4)  Furosemide 40 Mg  Tabs (Furosemide) .... Take 1 Tablet By Mouth Two Times A Day 5)  Metformin Hcl 500 Mg  Tb24 (Metformin Hcl) .... Take 1 Tablet By Mouth Once A Day 6)  Avapro 300 Mg Tabs (Irbesartan) .Marland Kitchen.. 1 Once Daily 7)  Fexofenadine Hcl 180 Mg Tabs (Fexofenadine Hcl) .... As Needed 8)  Simvastatin 20 Mg  Tabs (Simvastatin) .... Take 1 Tablet By Mouth Once A Day 9)  Doxazosin Mesylate 4 Mg Tabs (Doxazosin Mesylate) .Marland Kitchen.. 1 By Mouth At Bedtime For Blood Pressure 10)  Trazodone Hcl 50 Mg Tabs (Trazodone Hcl) .Marland Kitchen.. 1 To 2 Tabs At Bedtime As Needed 11)  Clonazepam 0.5 Mg Tabs (Clonazepam) .Marland Kitchen.. 1 Tab Two Times A Day 12)  Temazepam 15  Mg Caps (Temazepam) .Marland Kitchen.. 1 At Bedtime 13)  K-Phos 500 Mg Tabs (Potassium Phosphate Monobasic) .Marland Kitchen.. 1 Tab By Mouth Two Times A Day 14)  Metolazone 2.5 Mg Tabs (Metolazone) .Marland Kitchen.. 1 Tab M-W-F 30 Minutes Before Am Furosemide. 15)  Aleve 220 Mg Tabs (Naproxen Sodium) .Marland Kitchen.. 1 By Mouth Two Times A Day 16)  Nexium 40 Mg Cpdr (Esomeprazole Magnesium) .... Take 1 Every Morning  Allergies (verified): 1)  ! Codeine  Past History:  Past Medical History: Last updated: 02/13/2010 INGUINAL HERNIORRHAPHY, RIGHT, HX OF (ICD-V45.89) MITRAL VALVE INSUFF&AORTIC VALVE INSUFF (ICD-396.3) HYPERLIPIDEMIA-MIXED (ICD-272.4) OTHER AND UNSPECIFIED HYPERLIPIDEMIA (ICD-272.4) PNEUMONIA, HX OF (ICD-V12.60) OSTEOARTHRITIS (ICD-715.90) NEPHROLITHIASIS, HX OF (ICD-V13.01) HYPERTENSION (ICD-401.9) DIABETES MELLITUS, TYPE II (ICD-250.00) CORONARY ARTERY DISEASE (ICD-414.00) CONGESTIVE HEART FAILURE (ICD-428.0) ATRIAL FIBRILLATION (ICD-427.31) ASTHMA (ICD-493.90) ANEMIA-NOS (ICD-285.9) ALLERGIC RHINITIS (ICD-477.9)  Past Surgical History: Last updated: 05/28/2009 breast cysts w/ aspiration x 50 Hysterectomy 1990 Lumbar fusion'07 [facility] Sheehan) Total hip replacement-right Valentina Gu) June 2008 Right inquinal hernia repair '10 ([facility])  Family History: Last updated: 2009/05/15 Father:decease CHF Mother:decease pneumonia  Siblings:1 brother good health  Social History: Last updated: 09/27/2008 married '42 -38 years-widowed 1 son -'66, 1 daughter '77 Lives alone - I-ADLs,  including driving End-of-Life issues: no prolonged mechanical vent. support. Laymen's Guide to Death With Dignity provided with a suggestion that she discuss these issues with her family. Her son was supportive of having this discussion. 4 grandchildren Tobacco Use - No.  Alcohol Use - no Regular Exercise - no  Review of Systems       The patient complains of anorexia, weight loss, dyspnea on exertion, abdominal pain, melena, severe  indigestion/heartburn, and difficulty walking.  The patient denies fever, weight gain, vision loss, decreased hearing, hoarseness, chest pain, syncope, peripheral edema, prolonged cough, headaches, hemoptysis, incontinence, muscle weakness, depression, abnormal bleeding, and angioedema.    Physical Exam  General:  Very elderly white woman who appears weak and pale.  Head:  Mild temporal wasting, no deformity or other abnormalities Eyes:  vision grossly intact, pupils equal, pupils round, and pupils reactive to light.   Ears:  L ear normal and no external deformities.   Nose:  no external deformity, no external erythema, and no nasal discharge.   Mouth:  No oral lesions. Neck:  supple, full ROM, and no thyromegaly.   Chest Wall:  very thin, no deformity Breasts:  deferred Lungs:  normal respiratory effort, normal breath sounds, and no wheezes.   Heart:  normal rate.  II/VI systolic murmur best at the RSB, also LSB, [place].  Abdomen:  soft and normal bowel sounds.  Very tender to mild palpation at the epigastrum. Protruberant at the lower abdomen Rectal:  deferred until admitted. Genitalia:  deferred Msk:  normal ROM.  Tenderness to movement left hip. Pulses:  2+ radial Extremities:  No clubbing, cyanosis, edema, or deformity noted with normal full range of motion of all joints.   Neurologic:  alert & oriented X3 and cranial nerves II-XII intact.  Tremor of the legs.  Skin:  poor color, decreased turgor, no ecchymosis or petechiae. No ulcerations.  Cervical Nodes:  no anterior cervical adenopathy and no posterior cervical adenopathy.   Psych:  Oriented X3, memory intact for recent and remote, normally interactive, and good eye contact.     Impression & Recommendations:  Problem # 1:  ACUTE GASTRITIS WITH HEMORRHAGE (ICD-535.01)  patient with a sympotm complex that is highly suggestive of NSAID induced hemorrhagic gastritis with a drop in Hgb to 5.8 from 10+ g 2 weeks ago. She has had dark  stools but is on iron.  Plan - regular admit           T&C for 3 units of blood           PPI therapy IV until she is able to reliably take by mouth medication           f/u labs           GI consult if poor response to transfusion or for gross bleeding.  Her updated medication list for this problem includes:    Nexium 40 Mg Cpdr (Esomeprazole magnesium) .Marland Kitchen... Take 1 every morning  Orders: No Charge Patient Arrived (NCPA0) (NCPA0)  Problem # 2:  MITRAL VALVE INSUFF&AORTIC VALVE INSUFF (ICD-396.3) Chronic disease with loud mumur on maximum medical therapy. She is followed by Dr. Eden Emms who she has seen recently with no change in regimen.  Her updated medication list for this problem includes:    Metoprolol Tartrate 50 Mg Tabs (Metoprolol tartrate) .Marland Kitchen... 1 tab two times a day  Problem # 3:  HYPERLIPIDEMIA-MIXED (ICD-272.4) Stable on present meds  Her updated medication list for this problem includes:  Simvastatin 20 Mg Tabs (Simvastatin) .Marland Kitchen... Take 1 tablet by mouth once a day  Problem # 4:  OSTEOARTHRITIS (ICD-715.90) Severe pain in the left hip with other joint involvement as well.  Plan - NO NSIADs           Hydrocodone/APAP 5/325 q 6  The following medications were removed from the medication list:    Aleve 220 Mg Tabs (Naproxen sodium) .Marland Kitchen... 1 by mouth two times a day Her updated medication list for this problem includes:    Norco 5-325 Mg Tabs (Hydrocodone-acetaminophen) .Marland Kitchen... 1 by mouth q 6 hours as needed pain.  Problem # 5:  HYPERTENSION (ICD-401.9) Stable on present medications.  Her updated medication list for this problem includes:    Metoprolol Tartrate 50 Mg Tabs (Metoprolol tartrate) .Marland Kitchen... 1 tab two times a day    Furosemide 40 Mg Tabs (Furosemide) .Marland Kitchen... Take 1 tablet by mouth two times a day    Avapro 300 Mg Tabs (Irbesartan) .Marland Kitchen... 1 once daily    Doxazosin Mesylate 4 Mg Tabs (Doxazosin mesylate) .Marland Kitchen... 1 by mouth at bedtime for blood pressure    Metolazone  2.5 Mg Tabs (Metolazone) .Marland Kitchen... 1 tab m-w-f 30 minutes before am furosemide.  Orders: TLB-BMP (Basic Metabolic Panel-BMET) (80048-METABOL)  Problem # 6:  DIABETES MELLITUS, TYPE II (ICD-250.00) A1C has been drawn in the office. Will hold metformin while unstable and use sliding scale coverage  Her updated medication list for this problem includes:    Metformin Hcl 500 Mg Tb24 (Metformin hcl) .Marland Kitchen... Take 1 tablet by mouth once a day    Avapro 300 Mg Tabs (Irbesartan) .Marland Kitchen... 1 once daily  Orders: TLB-A1C / Hgb A1C (Glycohemoglobin) (83036-A1C)  Problem # 7:  Code Status  Discussed at previous times but at this time she states she would want CPR and full resuscitation but not prolong heroic support.   Complete Medication List: 1)  Ferrous Gluconate 325 Mg Tabs (Ferrous gluconate) .Marland Kitchen.. 1 two times a day 2)  Metoprolol Tartrate 50 Mg Tabs (Metoprolol tartrate) .Marland Kitchen.. 1 tab two times a day 3)  Lanoxin 0.125 Mg Tabs (Digoxin) .... Take 1 tablet by mouth once a day 4)  Furosemide 40 Mg Tabs (Furosemide) .... Take 1 tablet by mouth two times a day 5)  Metformin Hcl 500 Mg Tb24 (Metformin hcl) .... Take 1 tablet by mouth once a day 6)  Avapro 300 Mg Tabs (Irbesartan) .Marland Kitchen.. 1 once daily 7)  Fexofenadine Hcl 180 Mg Tabs (Fexofenadine hcl) .... As needed 8)  Simvastatin 20 Mg Tabs (Simvastatin) .... Take 1 tablet by mouth once a day 9)  Doxazosin Mesylate 4 Mg Tabs (Doxazosin mesylate) .Marland Kitchen.. 1 by mouth at bedtime for blood pressure 10)  Trazodone Hcl 50 Mg Tabs (Trazodone hcl) .Marland Kitchen.. 1 to 2 tabs at bedtime as needed 11)  Clonazepam 0.5 Mg Tabs (Clonazepam) .Marland Kitchen.. 1 tab two times a day 12)  Temazepam 15 Mg Caps (Temazepam) .Marland Kitchen.. 1 at bedtime 13)  K-phos 500 Mg Tabs (Potassium phosphate monobasic) .Marland Kitchen.. 1 tab by mouth two times a day 14)  Metolazone 2.5 Mg Tabs (Metolazone) .Marland Kitchen.. 1 tab m-w-f 30 minutes before am furosemide. 15)  Nexium 40 Mg Cpdr (Esomeprazole magnesium) .... Take 1 every morning 16)  Norco  5-325 Mg Tabs (Hydrocodone-acetaminophen) .Marland Kitchen.. 1 by mouth q 6 hours as needed pain.  Other Orders: TLB-CBC Platelet - w/Differential (85025-CBCD)  Patient Instructions: 1)  GI problems - the nausea and empty feeling in the stomach along with early  satiety and mild swallow trouble all point to irritation of the stomach from the aleve. Plan - blood count today; stop the aleve!! Continue taking nexium every day. 2)  Arthritis and joint pain - will start hydrocodone/.APAP 5/325 every 6 hours as needed for pain.  3)  Diabetes - will check the A1C today with recommendations to follow. 4)  Restless legs - will stick with the temazepam for now.  5)  Ear pain: the ear looks ok to me.  6)  Heart and blood pressure are OK.  Prescriptions: NORCO 5-325 MG TABS (HYDROCODONE-ACETAMINOPHEN) 1 by mouth q 6 hours as needed pain.  #90 x 3   Entered and Authorized by:   Jacques Navy MD   Signed by:   Jacques Navy MD on 09/05/2010   Method used:   Handwritten   RxID:   [id number]    Orders Added: 1)  TLB-CBC Platelet - w/Differential [85025-CBCD] 2)  TLB-A1C / Hgb A1C (Glycohemoglobin) [83036-A1C] 3)  TLB-BMP (Basic Metabolic Panel-BMET) [80048-METABOL] 4)  No Charge Patient Arrived (NCPA0) [NCPA0]  Appended Document: loss of appetitie,nausea/cd Patient: Landis Ihde Note: All result statuses are Final unless otherwise noted.  Tests: (1) CBC Platelet w/Diff (CBCD)   Order Note: Critical result called to Sarah on 09/05/2010 5:19 PM by Arrie Senate. Results were read back to caller. (HGB, HCT)   White Cell Count          7.3 K/uL                    4.5-10.5   Red Cell Count       [L]  1.79 Mil/uL                 3.87-5.11   Hemoglobin           [LL] 5.8 g/dL                    14.7-82.9     Critical result called to Sarah on 09/05/2010 5:19 PM by Arrie Senate. Results were read back to caller.   Hematocrit           [LL] 17.4 %                      36.0-46.0   MCV                        97.1 fl                     78.0-100.0   MCHC                      33.0 g/dL                   56.2-13.0   RDW                  [H]  16.1 %                      11.5-14.6   Platelet Count            154.0 K/uL                  150.0-400.0   Neutrophil %              67.7 %  43.0-77.0   Lymphocyte %              19.8 %                      12.0-46.0   Monocyte %                10.6 %                      3.0-12.0   Eosinophils%              1.6 %                       0.0-5.0   Basophils %               0.3 %                       0.0-3.0   Neutrophill Absolute      5.0 K/uL                    1.4-7.7   Lymphocyte Absolute       1.4 K/uL                    0.7-4.0   Monocyte Absolute         0.8 K/uL                    0.1-1.0  Eosinophils, Absolute                             0.1 K/uL                    0.0-0.7   Basophils Absolute        0.0 K/uL                    0.0-0.1  Tests: (2) Hemoglobin A1C (A1C)   Hemoglobin A1C            6.3 %                       4.6-6.5     Glycemic Control Guidelines for People with Diabetes:     Non Diabetic:  <6%     Goal of Therapy: <7%     Additional Action Suggested:  >8%   Tests: (3) BMP (METABOL)   Sodium                    142 mEq/L                   135-145   Potassium                 3.5 mEq/L                   3.5-5.1   Chloride                  99 mEq/L                    96-112   Carbon Dioxide       [H]  34 mEq/L                    19-32  Glucose              [H]  144 mg/dL                   16-10   BUN                  [H]  66 mg/dL                    9-60   Creatinine                1.1 mg/dL                   4.5-4.0   Calcium                   9.2 mg/dL                   9.8-11.9   GFR                  [L]  51.96 mL/min                >60.00

## 2010-09-11 NOTE — Progress Notes (Signed)
Summary: PT Order  Phone Note From Other Clinic   Caller: PT--[facility]  [phone] Summary of Call: Physical Therapist is calling for verbal ok to do balance training, and  therapeutic exercises. He will see the patient one time the first week and then 2x/week for the next 4 weeks. Is this ok? Initial call taken by: Lucious Groves,  September 10, 2009 2:00 PM  Follow-up for Phone Call        OK for PT extension Follow-up by: Jacques Navy MD,  September 10, 2009 3:58 PM  Additional Follow-up for Phone Call Additional follow up Details #1::        Left message on voicemail notifying and to call with any questions. Additional Follow-up by: Lucious Groves,  September 10, 2009 4:12 PM    Additional Follow-up for Phone Call Additional follow up Details #2::    Called PT again to make sure he recieved msg for OK for PT. Spoke with Sony and he did yesterday. closing note Follow-up by: Orlan Leavens,  September 11, 2009 10:05 AM

## 2010-09-11 NOTE — Progress Notes (Signed)
  Phone Note Call from Patient   Caller: [phone] Greg Summary of Call: Pt's son called. He was told that pt needs a glucometer. Is this correct? If yes, how often should she test and I will give sample from office and rx for supplies.  Initial call taken by: Lamar Sprinkles, CMA,  February 20, 2010 9:50 AM  Follow-up for Phone Call        Patient with h/o NIDDM on metformin which she should resume at 500mg  two times a day (was once daily)  Her Hgb A1C was 7.4%. I do not think she needs daily CBGs, but we can give them a glucometer and strips so that if she is feeling bad a blood sugar can be checked as needed. Her son will need instruction.  She needs a metabolic panel Follow-up by: Jacques Navy MD,  February 20, 2010 10:36 AM  Additional Follow-up for Phone Call Additional follow up Details #1::        no answer on son's #.................Marland KitchenLamar Sprinkles, CMA  February 20, 2010 5:30 PM   Pt's son informed, he will consider getting glucometer after speaking w/hematologist who she has apt for office visit and labs next week. Additional Follow-up by: Lamar Sprinkles, CMA,  February 21, 2010 4:03 PM

## 2010-09-11 NOTE — Progress Notes (Signed)
Summary: Memory loss?   Phone Note Call from Patient   Caller: Inis Sizer [phone] Summary of Call: Pt's son called: He says pt is experiencing some short term memory loss and wants to know if medication is an option? Does pt need office visit?  Initial call taken by: Lamar Sprinkles, CMA,  May 08, 2010 12:09 PM  Follow-up for Phone Call        Will need ov Follow-up by: Jacques Navy MD,  May 08, 2010 12:57 PM  Additional Follow-up for Phone Call Additional follow up Details #1::        Son informed, scheduled for office visit next week  Additional Follow-up by: Lamar Sprinkles, CMA,  May 08, 2010 2:00 PM

## 2010-09-11 NOTE — Progress Notes (Signed)
Summary: REFILL  Phone Note Refill Request Message from:  Pharmacy  Refills Requested: Medication #1:  TRAZODONE HCL 50 MG TABS 1 to 2 tabs at bedtime as needed Initial call taken by: Lamar Sprinkles, CMA,  June 18, 2010 5:59 PM  Follow-up for Phone Call        OK for as needed refills Follow-up by: Jacques Navy MD,  June 19, 2010 12:57 PM  Additional Follow-up for Phone Call Additional follow up Details #1::        Pt's son is req a call when complete [phone]. Vm he left was that 2 rx's were requested by pharm. Additional Follow-up by: Lamar Sprinkles, CMA,  June 19, 2010 2:28 PM    Prescriptions: TRAZODONE HCL 50 MG TABS (TRAZODONE HCL) 1 to 2 tabs at bedtime as needed  #30 Each x 4   Entered by:   Ami Bullins CMA   Authorized by:   Jacques Navy MD   Signed by:   Bill Salinas CMA on 06/19/2010   Method used:   Electronically to        [facility]  [phone]* (retail)       [address]       [place], [place]  [address]       Ph: [phone] or [phone]       Fax: [phone]   RxID:   [id number] DOXAZOSIN MESYLATE 4 MG TABS (DOXAZOSIN MESYLATE) 1 by mouth at bedtime for Blood pressure  #30 x 6   Entered by:   Ami Bullins CMA   Authorized by:   Jacques Navy MD   Signed by:   Bill Salinas CMA on 06/19/2010   Method used:   Electronically to        [facility]  [phone]* (retail)       [address]       [place], [place]  [address]       Ph: [phone] or [phone]       Fax: [phone]   RxID:   [id number]

## 2010-09-11 NOTE — Assessment & Plan Note (Signed)
Summary: chills, body aches, diarrhea / SD   Vital Signs:  Patient profile:   77 year old female Height:      61 inches Weight:      128 pounds O2 Sat:      93 % on Room air Temp:     98.1 degrees F oral Pulse rate:   82 / minute BP sitting:   164 / 72  (left arm) Cuff size:   regular  Vitals Entered By: Bill Salinas CMA (August 12, 2009 5:27 PM)  O2 Flow:  Room air CC: pt c/o body aches with chills x 3 days followed with weakness and fatigue/ ab   Primary Care Provider:  Lyndsi Altic  CC:  pt c/o body aches with chills x 3 days followed with weakness and fatigue/ ab.  History of Present Illness: Patient presents with myalgias, loose stools x 2-3/day, rigors, no documented fevers and on APAP, no respiratory distress, non-productive cough, HA. Increased urinary frequency without hematuria. Denies abdominal pain. No blood or mucus in the stools. She was seen Dec 24th by Dr. Milinda Antis and treated with Augmentin for respiratory infection which she just finished.  Current Medications (verified): 1)  Ferrous Sulfate 325 (65 Fe) Mg  Tabs (Ferrous Sulfate) .... Take 1 Tablet By Mouth Twice A Day 2)  Metoprolol Tartrate 50 Mg Tabs (Metoprolol Tartrate) .Marland Kitchen.. 1 Tab Two Times A Day 3)  Lanoxin 0.125 Mg  Tabs (Digoxin) .... Take 1 Tablet By Mouth Once A Day 4)  Furosemide 40 Mg  Tabs (Furosemide) .... Take 1 Tablet By Mouth Two Times A Day 5)  Amlodipine Besylate 10 Mg  Tabs (Amlodipine Besylate) .... Take 1 Tablet By Mouth Once A Day 6)  Metformin Hcl 500 Mg  Tb24 (Metformin Hcl) .... Take 1 Tablet By Mouth Once A Day 7)  Avalide 300-25 Mg  Tabs (Irbesartan-Hydrochlorothiazide) .... Take 1 Tablet By Mouth Once A Day 8)  Fexofenadine Hcl 180 Mg Tabs (Fexofenadine Hcl) .... As Needed 9)  Simvastatin 20 Mg  Tabs (Simvastatin) .... Take 1 Tablet By Mouth Once A Day 10)  Doxazosin Mesylate 8 Mg  Tabs (Doxazosin Mesylate) .... Take 1 Tab By Mouth At Bedtime 11)  Temazepam 15 Mg Caps (Temazepam) .Marland Kitchen.. 1 By  Mouth At Bedtime As Needed 12)  Trazodone Hcl 50 Mg Tabs (Trazodone Hcl) .Marland Kitchen.. 1 To 2 Tabs At Bedtime As Needed 13)  Clonazepam 0.5 Mg Tabs (Clonazepam) .Marland Kitchen.. 1 Tab Two Times A Day 14)  Augmentin 875-125 Mg Tabs (Amoxicillin-Pot Clavulanate) .Marland Kitchen.. 1 By Mouth Two Times A Day For 10 Days  Allergies (verified): 1)  ! Codeine  Past History:  Past Medical History: Last updated: 05/28/2009 INGUINAL HERNIORRHAPHY, RIGHT, HX OF (ICD-V45.89) MITRAL VALVE INSUFF&AORTIC VALVE INSUFF (ICD-396.3) HYPERLIPIDEMIA-MIXED (ICD-272.4) OTHER AND UNSPECIFIED HYPERLIPIDEMIA (ICD-272.4) PNEUMONIA, HX OF (ICD-V12.60) OSTEOARTHRITIS (ICD-715.90) NEPHROLITHIASIS, HX OF (ICD-V13.01) HYPERTENSION (ICD-401.9) DIABETES MELLITUS, TYPE II (ICD-250.00) CORONARY ARTERY DISEASE (ICD-414.00) CONGESTIVE HEART FAILURE (ICD-428.0) ATRIAL FIBRILLATION (ICD-427.31) ASTHMA (ICD-493.90) ANEMIA-NOS (ICD-285.9) ALLERGIC RHINITIS (ICD-477.9)  Past Surgical History: Last updated: 05/28/2009 breast cysts w/ aspiration x 50 Hysterectomy 1990 Lumbar fusion'07 Lovell Sheehan) Total hip replacement-right Valentina Gu) June 2008 Right inquinal hernia repair '10 ([facility])  Family History: Last updated: May 17, 2009 Father:decease CHF Mother:decease pneumonia  Siblings:1 brother good health  Social History: Last updated: 09/27/2008 married '42 -38 years-widowed 1 son -'44, 1 daughter '30 Lives alone - I-ADLs, including driving End-of-Life issues: no prolonged mechanical vent. support. Laymen's Guide to Death With Dignity provided with a suggestion that  she discuss these issues with her family. Her son was supportive of having this discussion. 4 grandchildren Tobacco Use - No.  Alcohol Use - no Regular Exercise - no  Review of Systems       The patient complains of prolonged cough and headaches.  The patient denies anorexia, weight loss, chest pain, dyspnea on exertion, hemoptysis, abdominal pain, incontinence, difficulty walking,  depression, unusual weight change, and angioedema.    Physical Exam  General:  WNWD white female in no acute distress Head:  normocephalic, atraumatic, and no abnormalities observed.   Eyes:  vision grossly intact, pupils equal, pupils round, corneas and lenses clear, and no injection.   Neck:  supple.   Lungs:  normal respiratory effort, no intercostal retractions, no accessory muscle use, normal breath sounds, no crackles, and no wheezes.   Heart:  IRIR III/VI systolic murmur at the apex to the left axillary line, LSB, lesser II/VI RSB Abdomen:  soft, non-tender, normal bowel sounds, no distention, no guarding, and no rigidity.   Msk:  normal ROM, no joint tenderness, no joint swelling, and no redness over joints.   Neurologic:  alert & oriented X3 and cranial nerves II-XII intact.   Skin:  turgor normal and color normal.   Psych:  normally interactive and good eye contact.     Impression & Recommendations:  Problem # 1:  DIARRHEA (ICD-787.91) Patient with post-antibiotic diarrhea, though mild. High concern for C. diff colitis  Plan - stool for C. Diff - patient provided sterile container and hat           Flagyl 500mg  three times a day x 10 days  Complete Medication List: 1)  Ferrous Sulfate 325 (65 Fe) Mg Tabs (Ferrous sulfate) .... Take 1 tablet by mouth twice a day 2)  Metoprolol Tartrate 50 Mg Tabs (Metoprolol tartrate) .Marland Kitchen.. 1 tab two times a day 3)  Lanoxin 0.125 Mg Tabs (Digoxin) .... Take 1 tablet by mouth once a day 4)  Furosemide 40 Mg Tabs (Furosemide) .... Take 1 tablet by mouth two times a day 5)  Amlodipine Besylate 10 Mg Tabs (Amlodipine besylate) .... Take 1 tablet by mouth once a day 6)  Metformin Hcl 500 Mg Tb24 (Metformin hcl) .... Take 1 tablet by mouth once a day 7)  Avalide 300-25 Mg Tabs (Irbesartan-hydrochlorothiazide) .... Take 1 tablet by mouth once a day 8)  Fexofenadine Hcl 180 Mg Tabs (Fexofenadine hcl) .... As needed 9)  Simvastatin 20 Mg Tabs  (Simvastatin) .... Take 1 tablet by mouth once a day 10)  Doxazosin Mesylate 8 Mg Tabs (Doxazosin mesylate) .... Take 1 tab by mouth at bedtime 11)  Temazepam 15 Mg Caps (Temazepam) .Marland Kitchen.. 1 by mouth at bedtime as needed 12)  Trazodone Hcl 50 Mg Tabs (Trazodone hcl) .Marland Kitchen.. 1 to 2 tabs at bedtime as needed 13)  Clonazepam 0.5 Mg Tabs (Clonazepam) .Marland Kitchen.. 1 tab two times a day 14)  Metronidazole 500 Mg Tabs (Metronidazole) .Marland Kitchen.. 1 by mouth three times a day x 10days for possible c. diff. Prescriptions: METRONIDAZOLE 500 MG TABS (METRONIDAZOLE) 1 by mouth three times a day x 10days for possible C. Diff.  #30 x 1   Entered and Authorized by:   Jacques Navy MD   Signed by:   Jacques Navy MD on 08/12/2009   Method used:   Electronically to        [facility]  [phone]* (retail)       [phone] [facility] [address]  [place], [place]  [address]       Ph: [phone] or [phone]       Fax: [phone]   RxID:   [id number]

## 2010-09-11 NOTE — Miscellaneous (Signed)
Summary: Plan of Care & treatment/[facility]  Plan of Care & treatment/[facility]   Imported By: Sherian Rein 11/26/2009 08:03:32  _____________________________________________________________________  External Attachment:    Type:   Image     Comment:   External Document

## 2010-09-11 NOTE — Progress Notes (Signed)
  Phone Note Other Incoming   Summary of Call: After reviewing pt's chart, there is no record of Bone Density scan, colonoscopy or mammogram. Pt has never had a tetanus at our office nor has she ever had Zostavax injection. Just an FYI Initial call taken by: Ami Bullins CMA,  September 03, 2009 9:31 AM

## 2010-09-11 NOTE — Miscellaneous (Signed)
Summary: Physician Orders/[facility]  Physician Orders/[facility]   Imported By: Sherian Rein 11/26/2009 11:32:35  _____________________________________________________________________  External Attachment:    Type:   Image     Comment:   External Document

## 2010-09-12 NOTE — Letter (Signed)
Summary: Call A Nurse  Call A Nurse   Imported By: Sherian Rein 09/04/2009 12:22:12  _____________________________________________________________________  External Attachment:    Type:   Image     Comment:   External Document

## 2010-09-16 ENCOUNTER — Ambulatory Visit: Payer: Medicare Other | Admitting: Internal Medicine

## 2010-09-16 ENCOUNTER — Other Ambulatory Visit: Payer: Self-pay | Admitting: Orthopedic Surgery

## 2010-09-16 ENCOUNTER — Other Ambulatory Visit: Payer: Self-pay

## 2010-09-16 DIAGNOSIS — M25552 Pain in left hip: Secondary | ICD-10-CM

## 2010-09-16 NOTE — Discharge Summary (Signed)
NAMEGRACIN, SOOHOO              ACCOUNT NO.:  [id number]  MEDICAL RECORD NO.:  [id number]          PATIENT TYPE:  INP  LOCATION:  1316                         FACILITY:  [facility]  PHYSICIAN:  Rosalyn Gess. Malonie Tatum, MD  DATE OF BIRTH:  12-24-1919  DATE OF ADMISSION:  09/05/2010 DATE OF DISCHARGE:  09/09/2010                              DISCHARGE SUMMARY   ADMITTING DIAGNOSIS: 1. Hemorrhagic gastritis with anemia. 2. Mitral valve insufficiency and aortic insufficiency. 3. Hyperlipidemia. 4. Osteoarthritis. 5. Hypertension. 6. Diabetes mellitus.  DISCHARGE DIAGNOSES: 1. Hemorrhagic gastritis with anemia. 2. Mitral valve insufficiency and aortic insufficiency. 3. Hyperlipidemia. 4. Osteoarthritis. 5. Hypertension. 6. Diabetes mellitus.  CONSULTANT:  None.  PROCEDURES:  Transfusion 5 units of packed red cells.  HISTORY OF PRESENT ILLNESS:  Ms. Jacot is an 77 year old woman with a history of osteoarthritis for which she had been taking NSAIDS.  The patient was brought to the office because of progressive weakness and loss of appetite.  She had had decreased p.o. intake.  She complained of frequent nausea without emesis.  She described a chronic emptying, gnawing feeling in her stomach.  She had mild dysphasia.  She had no regurgitation.  She did admit to early satiety.  The patient reports her stools have been loose and did admit to her stools being dark, which she attributed to iron.  She was taking Nexium at the time of admission.  The patient in the office was found to be somewhat weak, not significantly orthostatic, but office laboratory revealed the patient to have a hemoglobin of 5.8, and subsequently, she is admitted to the hospital with a hemorrhagic gastritis.  In the hospital, she was heme- positive in terms of stool.  Please see the EMR-generated H&P for Past Medical History, Family History and Social History.  PHYSICAL EXAMINATION:  Physical exam on admission  revealed a very elderly, white woman who appeared weak and pale.  She had mild temporal wasting. HEENT: Exam was unremarkable. LUNGS:  The patient was moving air well with no increased work of breathing.  No rales or wheezes.  CARDIOVASCULAR:  2+ radial pulse.  She had a II/VI systolic murmur best heard at the right sternal border. Murmur also noted at left sternal border and apex. ABDOMEN:  Soft.  She had normal bowel sounds.  She had tenderness to palpation at the epigastrium.  Lower abdomen was protuberant, but not tender. RECTAL:  Exam in hospital did reveal the patient to have normal sphincter tone.  She had black to maroon stools that were strongly heme positive. MUSCULOSKELETAL:  Unremarkable, but she did have tenderness with movement of the left hip.  She had good pulses peripherally. NEUROLOGIC:  Exam revealed the patient to be alert and oriented x3. Cranial nerves II-XII were intact.  The patient did have some restlessness in her legs. DERMATOLOGIC:  The patient had poor color and decreased turgor.  No ecchymoses or petechiae were noted.  LABORATORY:  In the office on the day of admission revealed a hemoglobin of 5.8 grams, hematocrit was 17.4%, white count was 7,300.  She had a normal differential.  A1c was 6.3%.  Chemistries  with a glucose of 144, BUN of 66, creatinine of 1.1.  HOSPITAL COURSE:  GI with hemorrhagic gastritis.  The patient was transfused 3 units of packed cells.  Her hemoglobin came up to 8.2, then 8.4 grams but dropped to 7.9 grams on 1/29.  The patient was typed and crossmatched for an additional 2 units.  Because of antibodies, she was difficult to crossmatched and blood was not received until the night of the 30th.  The patient was transfused two additional units.  Follow-up hemoglobin was 11.1 grams.  The patient had increased appetite, decreased epigastric abdominal pain, no gnawing discomfort.  The patient had not passed any stool since being in  hospital.  With the patient's vital signs being stable, with her hemoglobin having responded to a 5- unit transfusion, she is thought to be stable and ready for discharge to home.  She will have close follow-up in 7-10 days with a follow-up H&H. The patient will be continued on proton pump inhibitor twice daily until seen in follow-up.  The patient's other medical problems remained stable.  DISCHARGE EXAMINATION:  Temperature of 98.1, blood pressure 156/60, heart rate was 88, respirations 24, O2 saturations 94% on room air. GENERAL APPEARANCE:  This is an elderly woman in no acute distress.  She does appear somewhat frail. HEENT: Exam notable for temporal wasting. Conjunctivae and sclerae were clear. CHEST:  Patient is moving air well with no rales, wheezes, rhonchi or increased work of breathing.  CARDIOVASCULAR:  2+ radial pulse.  Her precordium was quiet.  She had a II/VI systolic murmur heard best at the right sternal border.  Murmur also persisted at the apex. ABDOMEN:  The patient had positive bowel sounds in all four quadrant. She had no tenderness to palpation in the epigastrium. NEUROLOGIC EXAM:  The patient is awake, alert.  She is oriented to person, place and examiner.  She follows commands without difficulty. Cranial nerves II-XII are normal with normal facial symmetry and motion. Extraocular movements were intact.  Pupils were equal, round and reactive.  Motor strength:  The patient had a normal grip strength.  She was able to stand from a sitting position without assistance. Cerebellar function:  The patient is somewhat unsteady at station.  She is able to move from standing to the sitting without difficulty.  FINAL LABORATORY:  H & H on the morning of discharge was a hemoglobin of 11.2 g and hematocrit 33.9%.  Other labs were as at admission without repeat.  DISPOSITION: 1. The patient is discharged home.  She does stay with her son and     daughter so that she is  never really alone. 2. The patient will be seen in the office for follow-up in 7-10 days     as noted.  CONDITION ON DISCHARGE:  The patient's condition at time of discharge dictation is stable, but guarded given her advanced age and multiple comorbidities.     Rosalyn Gess Therin Vetsch, MD     MEN/MEDQ  D:  09/09/2010  T:  09/09/2010  Job:  [id number]  Electronically Signed by Illene Regulus MD on 09/16/2010 07:25:16 AM

## 2010-09-17 ENCOUNTER — Telehealth: Payer: Self-pay | Admitting: Internal Medicine

## 2010-09-17 ENCOUNTER — Ambulatory Visit
Admission: RE | Admit: 2010-09-17 | Discharge: 2010-09-17 | Disposition: A | Discharge status: Home / Self Care | Payer: Medicare Other | Source: Ambulatory Visit | Attending: Orthopedic Surgery | Admitting: Orthopedic Surgery

## 2010-09-17 DIAGNOSIS — M25552 Pain in left hip: Secondary | ICD-10-CM

## 2010-09-17 NOTE — Assessment & Plan Note (Signed)
Summary: dizzyness,nausea,noappetite,and confusion/lb   Vital Signs:  Patient profile:   77 year old female Height:      64 inches O2 Sat:      96 % on Room air Temp:     98.0 degrees F Pulse rate:   59 / minute BP sitting:   118 / 40  (left arm) Cuff size:   regular  Vitals Entered By: Bill Salinas CMA (September 11, 2010 2:08 PM)  O2 Flow:  Room air CC: pt here with c/o ongoing nausea dizziness, lethargic and ver weak/ ab   Primary Care Provider:  Norins  CC:  pt here with c/o ongoing nausea dizziness and lethargic and ver weak/ ab.  History of Present Illness: Patinet was recently admitted with acute anemia to Hgb 5.8 most likely due to UGI bleed secondary to NSAIDs. In hospital she stabilized. She received a total of 5 units PRBCs with a discharge Hgb of 11.2. Since being home she has not eaten much: no dysphagia, no odynophagia. She does have some mild dizziness and confusion. She was disoriented, confusing day and night. She denies any abdominal pain and has not had a BM to note if there is melena.  Current Medications (verified): 1)  Ferrous Gluconate 325 Mg Tabs (Ferrous Gluconate) .Marland Kitchen.. 1 Two Times A Day 2)  Metoprolol Tartrate 50 Mg Tabs (Metoprolol Tartrate) .Marland Kitchen.. 1 Tab Two Times A Day 3)  Lanoxin 0.125 Mg  Tabs (Digoxin) .... Take 1 Tablet By Mouth Once A Day 4)  Furosemide 40 Mg  Tabs (Furosemide) .... Take 1 Tablet By Mouth Two Times A Day 5)  Metformin Hcl 500 Mg  Tb24 (Metformin Hcl) .... Take 1 Tablet By Mouth Once A Day 6)  Avapro 300 Mg Tabs (Irbesartan) .Marland Kitchen.. 1 Once Daily 7)  Fexofenadine Hcl 180 Mg Tabs (Fexofenadine Hcl) .... As Needed 8)  Simvastatin 20 Mg  Tabs (Simvastatin) .... Take 1 Tablet By Mouth Once A Day 9)  Doxazosin Mesylate 4 Mg Tabs (Doxazosin Mesylate) .Marland Kitchen.. 1 By Mouth At Bedtime For Blood Pressure 10)  Trazodone Hcl 50 Mg Tabs (Trazodone Hcl) .Marland Kitchen.. 1 To 2 Tabs At Bedtime As Needed 11)  Clonazepam 0.5 Mg Tabs (Clonazepam) .Marland Kitchen.. 1 Tab Two Times A  Day 12)  Temazepam 15 Mg Caps (Temazepam) .Marland Kitchen.. 1 At Bedtime 13)  K-Phos 500 Mg Tabs (Potassium Phosphate Monobasic) .Marland Kitchen.. 1 Tab By Mouth Two Times A Day 14)  Metolazone 2.5 Mg Tabs (Metolazone) .Marland Kitchen.. 1 Tab M-W-F 30 Minutes Before Am Furosemide. 15)  Nexium 40 Mg Cpdr (Esomeprazole Magnesium) .... Take 1 Every Morning 16)  Norco 5-325 Mg Tabs (Hydrocodone-Acetaminophen) .Marland Kitchen.. 1 By Mouth Q 6 Hours As Needed Pain.  Allergies (verified): 1)  ! Codeine  Past History:  Past Medical History: Last updated: 02/13/2010 INGUINAL HERNIORRHAPHY, RIGHT, HX OF (ICD-V45.89) MITRAL VALVE INSUFF&AORTIC VALVE INSUFF (ICD-396.3) HYPERLIPIDEMIA-MIXED (ICD-272.4) OTHER AND UNSPECIFIED HYPERLIPIDEMIA (ICD-272.4) PNEUMONIA, HX OF (ICD-V12.60) OSTEOARTHRITIS (ICD-715.90) NEPHROLITHIASIS, HX OF (ICD-V13.01) HYPERTENSION (ICD-401.9) DIABETES MELLITUS, TYPE II (ICD-250.00) CORONARY ARTERY DISEASE (ICD-414.00) CONGESTIVE HEART FAILURE (ICD-428.0) ATRIAL FIBRILLATION (ICD-427.31) ASTHMA (ICD-493.90) ANEMIA-NOS (ICD-285.9) ALLERGIC RHINITIS (ICD-477.9)  Past Surgical History: Last updated: 05/28/2009 breast cysts w/ aspiration x 50 Hysterectomy 1990 Lumbar fusion'07 [facility]ell Sheehan) Total hip replacement-right Valentina Gu) June 2008 Right inquinal hernia repair '10 ([facility]) FH reviewed for relevance, SH/Risk Factors reviewed for relevance  Review of Systems       The patient complains of anorexia and decreased hearing.  The patient denies fever, weight loss, hoarseness, chest pain,  abdominal pain, melena, hematochezia, severe indigestion/heartburn, muscle weakness, difficulty walking, depression, and abnormal bleeding.    Physical Exam  General:  elderly white woman in w/c in no acute distress Neck:  supple.   Lungs:  normal respiratory effort and normal breath sounds.   Heart:  normal rate and regular rhythm.   Abdomen:  soft, non-tender, normal bowel sounds, and no guarding.   Pulses:  2+  radial Neurologic:  alert & oriented X3 and cranial nerves II-XII intact.   Skin:  por turgor but no pallor   Impression & Recommendations:  Problem # 1:  ACUTE GASTRITIS WITH HEMORRHAGE (ICD-535.01) Due to patients symptoms she was brought in acutely. Lab revealed a stable Hgb at 10.8 down slightly from Hgb 11.4 at hospital d/c. No active signs of bleeding.  Plan - continue PPI therapy           return for abdominal pain or melanic stools.   Her updated medication list for this problem includes:    Nexium 40 Mg Cpdr (Esomeprazole magnesium) .Marland Kitchen... Take 1 every morning  Complete Medication List: 1)  Ferrous Gluconate 325 Mg Tabs (Ferrous gluconate) .Marland Kitchen.. 1 two times a day 2)  Metoprolol Tartrate 50 Mg Tabs (Metoprolol tartrate) .Marland Kitchen.. 1 tab two times a day 3)  Lanoxin 0.125 Mg Tabs (Digoxin) .... Take 1 tablet by mouth once a day 4)  Furosemide 40 Mg Tabs (Furosemide) .... Take 1 tablet by mouth two times a day 5)  Metformin Hcl 500 Mg Tb24 (Metformin hcl) .... Take 1 tablet by mouth once a day 6)  Avapro 300 Mg Tabs (Irbesartan) .Marland Kitchen.. 1 once daily 7)  Fexofenadine Hcl 180 Mg Tabs (Fexofenadine hcl) .... As needed 8)  Simvastatin 20 Mg Tabs (Simvastatin) .... Take 1 tablet by mouth once a day 9)  Doxazosin Mesylate 4 Mg Tabs (Doxazosin mesylate) .Marland Kitchen.. 1 by mouth at bedtime for blood pressure 10)  Trazodone Hcl 50 Mg Tabs (Trazodone hcl) .Marland Kitchen.. 1 to 2 tabs at bedtime as needed 11)  Clonazepam 0.5 Mg Tabs (Clonazepam) .Marland Kitchen.. 1 tab two times a day 12)  Temazepam 15 Mg Caps (Temazepam) .Marland Kitchen.. 1 at bedtime 13)  K-phos 500 Mg Tabs (Potassium phosphate monobasic) .Marland Kitchen.. 1 tab by mouth two times a day 14)  Metolazone 2.5 Mg Tabs (Metolazone) .Marland Kitchen.. 1 tab m-w-f 30 minutes before am furosemide. 15)  Nexium 40 Mg Cpdr (Esomeprazole magnesium) .... Take 1 every morning 16)  Norco 5-325 Mg Tabs (Hydrocodone-acetaminophen) .Marland Kitchen.. 1 by mouth q 6 hours as needed pain.  Patient Instructions: 1)  Anemia - from upper  GI bleed. Blood count was 10.8, down from 11.2 which is not a significant drop 2)  Weakness - no evidence of significant dehydration with Blood pressure going from 110/40 sitting to 120/40 standing with a stable pulse of 64. 3)  plan - take 2 sennekot two times a day until bowels are moving. Drink two can of glucerna a day and eat what you can .   Orders Added: 1)  Est. Patient Level III [[address]]

## 2010-09-17 NOTE — Progress Notes (Signed)
  Phone Note Call from Patient   Caller: Greg/SON [phone] Summary of Call: Pt's son called - Pt c/o dizzyness, nausea, no appetite and confusion.  Initial call taken by: Lamar Sprinkles, CMA,  September 11, 2010 12:26 PM  Follow-up for Phone Call        please bring to office for stat CBC to r/o recurrent bleed.  Follow-up by: Jacques Navy MD,  September 11, 2010 1:22 PM  Additional Follow-up for Phone Call Additional follow up Details #1::        Done Additional Follow-up by: Lamar Sprinkles, CMA,  September 11, 2010 2:46 PM

## 2010-09-18 ENCOUNTER — Telehealth: Payer: Self-pay | Admitting: Internal Medicine

## 2010-09-23 ENCOUNTER — Encounter (INDEPENDENT_AMBULATORY_CARE_PROVIDER_SITE_OTHER): Payer: Self-pay | Admitting: *Deleted

## 2010-09-23 ENCOUNTER — Other Ambulatory Visit: Payer: Self-pay | Admitting: Internal Medicine

## 2010-09-23 ENCOUNTER — Other Ambulatory Visit: Payer: Medicare Other

## 2010-09-23 DIAGNOSIS — D649 Anemia, unspecified: Secondary | ICD-10-CM

## 2010-09-23 LAB — CBC WITH DIFFERENTIAL/PLATELET
Basophils Relative: 0.5 % (ref 0.0–3.0)
Eosinophils Absolute: 0.1 10*3/uL (ref 0.0–0.7)
HCT: 28.5 % — ABNORMAL LOW (ref 36.0–46.0)
Hemoglobin: 9.6 g/dL — ABNORMAL LOW (ref 12.0–15.0)
Lymphocytes Relative: 11.5 % — ABNORMAL LOW (ref 12.0–46.0)
Lymphs Abs: 0.8 10*3/uL (ref 0.7–4.0)
MCHC: 33.8 g/dL (ref 30.0–36.0)
MCV: 90.7 fl (ref 78.0–100.0)
Neutro Abs: 5.4 10*3/uL (ref 1.4–7.7)
RBC: 3.14 Mil/uL — ABNORMAL LOW (ref 3.87–5.11)

## 2010-09-24 ENCOUNTER — Ambulatory Visit (INDEPENDENT_AMBULATORY_CARE_PROVIDER_SITE_OTHER): Payer: Medicare Other | Admitting: Internal Medicine

## 2010-09-24 ENCOUNTER — Encounter: Payer: Self-pay | Admitting: Internal Medicine

## 2010-09-24 DIAGNOSIS — M1990 Unspecified osteoarthritis, unspecified site: Secondary | ICD-10-CM

## 2010-09-24 DIAGNOSIS — D649 Anemia, unspecified: Secondary | ICD-10-CM

## 2010-09-24 DIAGNOSIS — K2901 Acute gastritis with bleeding: Secondary | ICD-10-CM

## 2010-09-25 NOTE — Progress Notes (Signed)
Summary: RF  Phone Note Refill Request Message from:  Pharmacy  Refills Requested: Medication #1:  TEMAZEPAM 15 MG CAPS 1 at bedtime [facility]  Initial call taken by: Lamar Sprinkles, CMA,  September 17, 2010 3:30 PM  Follow-up for Phone Call        ok for refill x 5 Follow-up by: Jacques Navy MD,  September 17, 2010 4:23 PM    Prescriptions: TEMAZEPAM 15 MG CAPS (TEMAZEPAM) 1 at bedtime  #30 x 5   Entered by:   Rock Nephew CMA   Authorized by:   Jacques Navy MD   Signed by:   Rock Nephew CMA on 09/17/2010   Method used:   Telephoned to ...       [facility]  [address] [address]  [phone]* (retail)       [address]       [place], [place]  [address]       Ph: [phone] or [phone]       Fax: [phone]   RxID:   [id number]

## 2010-09-25 NOTE — Letter (Signed)
Summary: [facility]  [facility]   Imported By: Sherian Rein 09/18/2010 07:15:43  _____________________________________________________________________  External Attachment:    Type:   Image     Comment:   External Document

## 2010-10-01 NOTE — Progress Notes (Signed)
Summary: LABS NEEDED  Phone Note Call from Patient Call back at Home Phone [phone]   Caller: Son Summary of Call: PT'S SON CALLED.  SHE HAS AN APPT NEXT WEEK.  SHOULD SHE DO LABS FOR HEMOGLOBIN PRIOR?  IRON AND SOMETHING ELSE WAS STOPPED.  WHEN TO RESTART? [phone] Initial call taken by: Hilarie Fredrickson,  September 18, 2010 3:18 PM  Follow-up for Phone Call        1. yes - will get CBC, can due before visit 285.9 2.Do not recall stopping iron and notes do not indicate that this was ordered. So..Marland KitchenMarland KitchenMarland Kitchenplease take iron.  Follow-up by: Jacques Navy MD,  September 18, 2010 5:56 PM  Additional Follow-up for Phone Call Additional follow up Details #1::        Son informed - please put in labs for Tuesday - cbcd 285.9 THANKS Additional Follow-up by: Lamar Sprinkles, CMA,  September 19, 2010 9:55 AM    Additional Follow-up for Phone Call Additional follow up Details #2::    lab in [facility] for 2/14 Follow-up by: Verdell Face,  September 22, 2010 4:19 PM

## 2010-10-01 NOTE — Assessment & Plan Note (Signed)
Summary: post hosp   Vital Signs:  Patient profile:   77 year old female Weight:      119.75 pounds BMI:     20.63 Temp:     98.3 degrees F oral Pulse rate:   88 / minute Pulse rhythm:   regular Resp:     16 per minute BP sitting:   138 / 60  Vitals Entered By: Lamar Sprinkles, CMA (September 24, 2010 9:17 AM) CC: Hospital F/u/SD Comments Ferrous gluconate on hold per hospital d/c Nexium two times a day until eval per hospital d/c..........Marland KitchenLamar Sprinkles, CMA  September 24, 2010 9:19 AM    Primary Care Provider:  Norins  CC:  Hospital F/u/SD.  History of Present Illness: Patient presents for follow-up of anemia. She was d/c from hospital Jan 31st after an episode of hemorrhagic gastritis requiring 5 units of blood. At discharge her Hg was 11.2. At first follow up post- hopsital Hgb was 10.4. She had H/H Feb 14th with a Hgb 9.6. She has been feeling ok. She has a good appetite and does not have discomfort, weakness or shortness of breath.   She is followed by Dr. Arbutus Ped for anemia of chronic disease and iron deficiency. She was last seen in early January and is to follow-up in March.  [name] does have DJD and is s/p right THR. She does have marked left hip pain that is limiting her activity. Her son is concerned for possible falls. Dr. Eden Emms has in the past stated she had an increased risk for anesthesia and surgery secondary to A. fib and valvular heart disease. However, Mr. Hargraves  raises the question of whether hip replacement, with higher risk, is better than loss of mobility and increased risk of falls.    Current Medications (verified): 1)  Ferrous Gluconate 325 Mg Tabs (Ferrous Gluconate) .Marland Kitchen.. 1 Two Times A Day - On Hold 09/25/10 2)  Metoprolol Tartrate 50 Mg Tabs (Metoprolol Tartrate) .Marland Kitchen.. 1 Tab Two Times A Day 3)  Lanoxin 0.125 Mg  Tabs (Digoxin) .... Take 1 Tablet By Mouth Once A Day 4)  Furosemide 40 Mg  Tabs (Furosemide) .... Take 1 Tablet By Mouth Two Times A  Day 5)  Metformin Hcl 500 Mg  Tb24 (Metformin Hcl) .... Take 1 Tablet By Mouth Once A Day 6)  Avapro 300 Mg Tabs (Irbesartan) .Marland Kitchen.. 1 Once Daily 7)  Fexofenadine Hcl 180 Mg Tabs (Fexofenadine Hcl) .... As Needed 8)  Simvastatin 20 Mg  Tabs (Simvastatin) .... Take 1 Tablet By Mouth Once A Day 9)  Doxazosin Mesylate 4 Mg Tabs (Doxazosin Mesylate) .Marland Kitchen.. 1 By Mouth At Bedtime For Blood Pressure 10)  Trazodone Hcl 50 Mg Tabs (Trazodone Hcl) .Marland Kitchen.. 1 To 2 Tabs At Bedtime As Needed 11)  Clonazepam 0.5 Mg Tabs (Clonazepam) .Marland Kitchen.. 1 Tab Two Times A Day 12)  Temazepam 15 Mg Caps (Temazepam) .Marland Kitchen.. 1 At Bedtime 13)  K-Phos 500 Mg Tabs (Potassium Phosphate Monobasic) .Marland Kitchen.. 1 Tab By Mouth Two Times A Day 14)  Metolazone 2.5 Mg Tabs (Metolazone) .Marland Kitchen.. 1 Tab M-W-F 30 Minutes Before Am Furosemide. 15)  Nexium 40 Mg Cpdr (Esomeprazole Magnesium) .... Take 1 Every Morning 16)  Norco 5-325 Mg Tabs (Hydrocodone-Acetaminophen) .Marland Kitchen.. 1 By Mouth Q 6 Hours As Needed Pain.  Allergies (verified): 1)  ! Codeine  Past History:  Past Medical History: Last updated: 02/13/2010 INGUINAL HERNIORRHAPHY, RIGHT, HX OF (ICD-V45.89) MITRAL VALVE INSUFF&AORTIC VALVE INSUFF (ICD-396.3) HYPERLIPIDEMIA-MIXED (ICD-272.4) OTHER AND UNSPECIFIED HYPERLIPIDEMIA (ICD-272.4) PNEUMONIA, HX  OF (ICD-V12.60) OSTEOARTHRITIS (ICD-715.90) NEPHROLITHIASIS, HX OF (ICD-V13.01) HYPERTENSION (ICD-401.9) DIABETES MELLITUS, TYPE II (ICD-250.00) CORONARY ARTERY DISEASE (ICD-414.00) CONGESTIVE HEART FAILURE (ICD-428.0) ATRIAL FIBRILLATION (ICD-427.31) ASTHMA (ICD-493.90) ANEMIA-NOS (ICD-285.9) ALLERGIC RHINITIS (ICD-477.9)  Past Surgical History: Last updated: 05/28/2009 breast cysts w/ aspiration x 50 Hysterectomy 1990 Lumbar fusion'07 Lovell Sheehan) Total hip replacement-right Valentina Gu) June 2008 Right inquinal hernia repair '10 ([facility])  Family History: Last updated: 16-May-2009 Father:decease CHF Mother:decease pneumonia  Siblings:1 brother  good health  Social History: Last updated: 09/27/2008 married '42 -38 years-widowed 1 son -'60, 1 daughter '47 Lives alone - I-ADLs, including driving End-of-Life issues: no prolonged mechanical vent. support. Laymen's Guide to Death With Dignity provided with a suggestion that she discuss these issues with her family. Her son was supportive of having this discussion. 4 grandchildren Tobacco Use - No.  Alcohol Use - no Regular Exercise - no  Review of Systems       The patient complains of weight loss and difficulty walking.  The patient denies anorexia, fever, decreased hearing, chest pain, dyspnea on exertion, prolonged cough, abdominal pain, melena, hematochezia, severe indigestion/heartburn, muscle weakness, suspicious skin lesions, depression, and abnormal bleeding.    Physical Exam  General:  Frail elderly white woman in a w/c in no distress Head:  normocephalic and atraumatic.   Eyes:  C&S clear Neck:  supple.   Lungs:  normal respiratory effort, normal breath sounds, no crackles, and no wheezes.   Heart:  IRIR controlled rate. II/VI systolic murmur at RSB, LSB and apex.  Abdomen:  soft, non-tender, and normal bowel sounds.   Pulses:  2+ radial Neurologic:  alert & oriented X3 and cranial nerves II-XII intact.   Skin:  poor turgor due to age. No suspicious lesions Psych:  normally interactive, good eye contact, and not anxious appearing.     Impression & Recommendations:  Problem # 1:  ACUTE GASTRITIS WITH HEMORRHAGE (ICD-535.01) Seems resolved without acute symptoms and a return of a normal appetite.  Her updated medication list for this problem includes:    Nexium 40 Mg Cpdr (Esomeprazole magnesium) .Marland Kitchen... Take 1 every morning  Problem # 2:  ANEMIA-NOS (ICD-285.9) Long term problem. She has had IV iron in the past. She has also had aranesp I believe. She will chronically have a downward drift in Hgb to the level she is at today.  Plan - continue to hold oral iron -  gastric irritant           keep appointment with Dr. Arbutus Ped - he will help with treatment, e.g. erythropoeitin support, etc.  Her updated medication list for this problem includes:    Ferrous Gluconate 325 Mg Tabs (Ferrous gluconate) .Marland Kitchen... 1 two times a day - on hold 09/25/10  Problem # 3:  OSTEOARTHRITIS (ICD-715.90) Patinet with progressive hip pain and increasing debilitation. She does have an increasing risk of fall. She and her family are aware of the increase risk for adverse events with surgery and anesthesia but do want to preserve her independence and mobility.  Plan - she should consult with Dr. Sherlean Foot about her candidacy for THR.  Her updated medication list for this problem includes:    Norco 5-325 Mg Tabs (Hydrocodone-acetaminophen) .Marland Kitchen... 1 by mouth q 6 hours as needed pain.  Complete Medication List: 1)  Ferrous Gluconate 325 Mg Tabs (Ferrous gluconate) .Marland Kitchen.. 1 two times a day - on hold 09/25/10 2)  Metoprolol Tartrate 50 Mg Tabs (Metoprolol tartrate) .Marland Kitchen.. 1 tab two times a day 3)  Lanoxin  0.125 Mg Tabs (Digoxin) .... Take 1 tablet by mouth once a day 4)  Furosemide 40 Mg Tabs (Furosemide) .... Take 1 tablet by mouth two times a day 5)  Metformin Hcl 500 Mg Tb24 (Metformin hcl) .... Take 1 tablet by mouth once a day 6)  Avapro 300 Mg Tabs (Irbesartan) .Marland Kitchen.. 1 once daily 7)  Fexofenadine Hcl 180 Mg Tabs (Fexofenadine hcl) .... As needed 8)  Simvastatin 20 Mg Tabs (Simvastatin) .... Take 1 tablet by mouth once a day 9)  Doxazosin Mesylate 4 Mg Tabs (Doxazosin mesylate) .Marland Kitchen.. 1 by mouth at bedtime for blood pressure 10)  Trazodone Hcl 50 Mg Tabs (Trazodone hcl) .Marland Kitchen.. 1 to 2 tabs at bedtime as needed 11)  Clonazepam 0.5 Mg Tabs (Clonazepam) .Marland Kitchen.. 1 tab two times a day 12)  Temazepam 15 Mg Caps (Temazepam) .Marland Kitchen.. 1 at bedtime 13)  K-phos 500 Mg Tabs (Potassium phosphate monobasic) .Marland Kitchen.. 1 tab by mouth two times a day 14)  Metolazone 2.5 Mg Tabs (Metolazone) .Marland Kitchen.. 1 tab m-w-f 30 minutes  before am furosemide. 15)  Nexium 40 Mg Cpdr (Esomeprazole magnesium) .... Take 1 every morning 16)  Norco 5-325 Mg Tabs (Hydrocodone-acetaminophen) .Marland Kitchen.. 1 by mouth q 6 hours as needed pain.   [name] Note: All result statuses are Final unless otherwise noted.  Tests: (1) CBC Platelet w/Diff (CBCD)   White Cell Count          7.0 K/uL                    4.5-10.5   Red Cell Count       [L]  3.14 Mil/uL                 3.87-5.11   Hemoglobin           [L]  9.6 g/dL                    16.1-09.6   Hematocrit           [L]  28.5 %                      36.0-46.0   MCV                       90.7 fl                     78.0-100.0   MCHC                      33.8 g/dL                   04.5-40.9   RDW                  [H]  17.2 %                      11.5-14.6   Platelet Count            165.0 K/uL                  150.0-400.0   Neutrophil %         [H]  77.5 %                      43.0-77.0   Lymphocyte %         [  L]  11.5 %                      12.0-46.0   Monocyte %                9.5 %                       3.0-12.0   Eosinophils%              1.0 %                       0.0-5.0   Basophils %               0.5 %                       0.0-3.0   Neutrophill Absolute      5.4 K/uL                    1.4-7.7   Lymphocyte Absolute       0.8 K/uL                    0.7-4.0   Monocyte Absolute         0.7 K/uL                    0.1-1.0  Eosinophils, Absolute                             0.1 K/uL                    0.0-0.7   Basophils Absolute        0.0 K/uL                    0.0-0.1  Orders Added: 1)  Est. Patient Level III [04540]

## 2010-10-02 ENCOUNTER — Telehealth: Payer: Self-pay | Admitting: Internal Medicine

## 2010-10-02 ENCOUNTER — Telehealth (INDEPENDENT_AMBULATORY_CARE_PROVIDER_SITE_OTHER): Payer: Self-pay | Admitting: *Deleted

## 2010-10-07 ENCOUNTER — Other Ambulatory Visit: Payer: Self-pay | Admitting: Internal Medicine

## 2010-10-07 ENCOUNTER — Other Ambulatory Visit: Payer: Medicare Other

## 2010-10-07 ENCOUNTER — Encounter (INDEPENDENT_AMBULATORY_CARE_PROVIDER_SITE_OTHER): Payer: Self-pay | Admitting: *Deleted

## 2010-10-07 DIAGNOSIS — D649 Anemia, unspecified: Secondary | ICD-10-CM

## 2010-10-07 LAB — CBC WITH DIFFERENTIAL/PLATELET
Basophils Absolute: 0 10*3/uL (ref 0.0–0.1)
Eosinophils Relative: 1 % (ref 0.0–5.0)
HCT: 26 % — ABNORMAL LOW (ref 36.0–46.0)
Lymphocytes Relative: 15.3 % (ref 12.0–46.0)
Lymphs Abs: 1.1 10*3/uL (ref 0.7–4.0)
Monocytes Relative: 6.5 % (ref 3.0–12.0)
Neutrophils Relative %: 77.2 % — ABNORMAL HIGH (ref 43.0–77.0)
Platelets: 165 10*3/uL (ref 150.0–400.0)
RDW: 17.4 % — ABNORMAL HIGH (ref 11.5–14.6)
WBC: 7 10*3/uL (ref 4.5–10.5)

## 2010-10-07 LAB — IRON: Iron: 37 ug/dL — ABNORMAL LOW (ref 42–145)

## 2010-10-07 NOTE — Progress Notes (Signed)
Summary: Anemia  Phone Note Call from Patient   Caller: Inis Sizer [phone] Summary of Call: Pt's son called. They spoke w/Dr [facility] office - they advised son that until cause of anemia, which is thought to be bleeding, has stopped  they can not develop a plan. Does Dr Debby Bud want stool specimen?  Initial call taken by: Lamar Sprinkles, CMA,  October 02, 2010 11:14 AM  Follow-up for Phone Call        yes - stool test/hemocult cards - can be done at home. yes- needs follow-up CBC 285.9: 11.1 at time of hospital D/c to 9.6 at last office visit. Follow-up by: Jacques Navy MD,  October 02, 2010 1:27 PM  Additional Follow-up for Phone Call Additional follow up Details #1::        Left vm for son to call office - 1. Mail or pick up cards at office? 2. what day does he want to bring pt for labs?  Additional Follow-up by: Lamar Sprinkles, CMA,  October 03, 2010 12:25 PM    Additional Follow-up for Phone Call Additional follow up Details #2::    Son informed - Please put lab order in for next tuesday cbcd 285.9 THANKS Follow-up by: Lamar Sprinkles, CMA,  October 03, 2010 12:38 PM  Additional Follow-up for Phone Call Additional follow up Details #3:: Details for Additional Follow-up Action Taken: order in [facility] for 2/28. Additional Follow-up by: Verdell Face,  October 03, 2010 12:56 PM

## 2010-10-07 NOTE — Progress Notes (Signed)
  Phone Note Refill Request Message from:  Fax from Pharmacy on October 02, 2010 10:51 AM  Refills Requested: Medication #1:  CLONAZEPAM 0.5 MG TABS 1 tab two times a day   Last Refilled: 03/12/2010 please Advise refills  Initial call taken by: Ami Bullins CMA,  October 02, 2010 10:52 AM  Follow-up for Phone Call        ok for refill x 5 Follow-up by: Jacques Navy MD,  October 02, 2010 1:24 PM  Additional Follow-up for Phone Call Additional follow up Details #1::        prescription was printed out. Rather than having prescription signed and having someone come to the office to pick it up, I have called this prescription in for the pt. Printed prescription trashed Additional Follow-up by: Ami Bullins CMA,  October 03, 2010 9:27 AM    Prescriptions: CLONAZEPAM 0.5 MG TABS (CLONAZEPAM) 1 tab two times a day  #60 x 5   Entered by:   Ami Bullins CMA   Authorized by:   Jacques Navy MD   Signed by:   Bill Salinas CMA on 10/03/2010   Method used:   Telephoned to ...       [facility]  [address]  [phone]* (retail)       [address]       [place], [place]  [address]       Ph: [phone] or [phone]       Fax: [phone]   RxID:   [id number] CLONAZEPAM 0.5 MG TABS (CLONAZEPAM) 1 tab two times a day  #60 x 5   Entered by:   Margaret Pyle, CMA   Authorized by:   Jacques Navy MD   Signed by:   Margaret Pyle, CMA on 10/03/2010   Method used:   Printed then faxed to ...       [facility]  [address]  [phone]* (retail)       [address]       [place], [place]  [address]       Ph: [phone] or [phone]       Fax: [phone]   RxID:   [id number]

## 2010-10-08 ENCOUNTER — Telehealth: Payer: Self-pay | Admitting: Internal Medicine

## 2010-10-09 ENCOUNTER — Encounter (INDEPENDENT_AMBULATORY_CARE_PROVIDER_SITE_OTHER): Payer: Self-pay | Admitting: *Deleted

## 2010-10-09 ENCOUNTER — Other Ambulatory Visit: Payer: Medicare Other

## 2010-10-09 ENCOUNTER — Telehealth: Payer: Self-pay | Admitting: Internal Medicine

## 2010-10-09 ENCOUNTER — Telehealth: Payer: Self-pay | Admitting: Gastroenterology

## 2010-10-09 ENCOUNTER — Other Ambulatory Visit: Payer: Self-pay | Admitting: Internal Medicine

## 2010-10-09 DIAGNOSIS — Z1211 Encounter for screening for malignant neoplasm of colon: Secondary | ICD-10-CM

## 2010-10-09 LAB — HEMOCCULT SLIDES (X 3 CARDS)
OCCULT 1: POSITIVE
OCCULT 2: POSITIVE
OCCULT 3: POSITIVE
OCCULT 4: POSITIVE

## 2010-10-10 ENCOUNTER — Telehealth: Payer: Self-pay | Admitting: Internal Medicine

## 2010-10-14 ENCOUNTER — Encounter (INDEPENDENT_AMBULATORY_CARE_PROVIDER_SITE_OTHER): Payer: Self-pay | Admitting: *Deleted

## 2010-10-14 ENCOUNTER — Telehealth: Payer: Self-pay | Admitting: Gastroenterology

## 2010-10-14 ENCOUNTER — Other Ambulatory Visit: Payer: Self-pay | Admitting: Internal Medicine

## 2010-10-14 ENCOUNTER — Other Ambulatory Visit: Payer: Medicare Other

## 2010-10-14 ENCOUNTER — Telehealth: Payer: Self-pay | Admitting: Internal Medicine

## 2010-10-14 DIAGNOSIS — D649 Anemia, unspecified: Secondary | ICD-10-CM

## 2010-10-14 LAB — HEMOGLOBIN: Hemoglobin: 8.2 g/dL — ABNORMAL LOW (ref 12.0–15.0)

## 2010-10-16 ENCOUNTER — Telehealth: Payer: Self-pay | Admitting: Internal Medicine

## 2010-10-16 NOTE — Progress Notes (Signed)
Summary: fax results  Phone Note From Other Clinic   Caller: Amy w/Dr Malva Limes [phone] Summary of Call: Per VO request, refaxed Pt's lab results to Fax# [phone] Initial call taken by: Burnard Leigh [facility]),  October 10, 2010 2:26 PM

## 2010-10-16 NOTE — Progress Notes (Signed)
Summary: RESULTS  Phone Note Call from Patient   Caller: Tammy Sours - [phone] Summary of Call: Pt's son is req results of labs. See last phone note - Dr  Sharlene Dory office has told pt that once bleeding has confirmed to no longer be cause of anemia they can proceed.  Initial call taken by: Lamar Sprinkles, CMA,  October 08, 2010 5:51 PM  Follow-up for Phone Call        Continued drop in hemoglobin - will refer to Dr. Russella Dar for further eval. Follow-up by: Jacques Navy MD,  October 09, 2010 11:25 AM  Additional Follow-up for Phone Call Additional follow up Details #1::        Pt's son informed Additional Follow-up by: Lamar Sprinkles, CMA,  October 09, 2010 11:56 AM

## 2010-10-16 NOTE — Progress Notes (Signed)
Summary: Triage  Phone Note Call from Patient Call back at Home Phone [phone]   Caller: Patient Call For: dR. Lamyah Creed Reason for Call: Talk to Nurse Summary of Call: Patient has chronic anemia and blood lose Dr. Arthur Holms wants this patient seen by Dr. Russella Dar in the next 7 to 10 days for this, you can call Gavin Pound at Dr. Arthur Holms office to schedule [phone] Initial call taken by: [place] Johnson,  October 09, 2010 1:49 PM  Follow-up for Phone Call        Patient is scheduled with Dr Russella Dar for 10/17/10 11:00.  I have left a message for Stanton Kidney and I spoke with the patient and she is aware of the appointment . Follow-up by: Darcey Nora RN, CGRN,  October 09, 2010 4:11 PM

## 2010-10-16 NOTE — Progress Notes (Signed)
Summary: HEME POSITIVE STOOL  Phone Note From Other Clinic   Summary of Call: Son req results of hemocult cards. I called lab, ALL were positive. They will result in EMR soon. Pt has referral for GI apt. Son would like to know results as soon as possible. OK?  Initial call taken by: Lamar Sprinkles, CMA,  October 09, 2010 12:13 PM  Follow-up for Phone Call        OK to give results of stool cards. Please follow-up to see when GI appt is scheduled Patient will need to have hemoglobin and hematocrit drawn 285.9 next week - Tuesday.  \par Thanks Follow-up by: Jacques Navy MD,  October 09, 2010 1:21 PM  Additional Follow-up for Phone Call Additional follow up Details #1::        Son informed, Boneta Lucks is next Friday at 11 am w/Dr Russella Dar.  Additional Follow-up by: Lamar Sprinkles, CMA,  October 09, 2010 5:34 PM

## 2010-10-16 NOTE — Progress Notes (Signed)
Summary: Potasium?   Phone Note Call from Patient   Summary of Call: Spoke w/son regarding previous phone note & results.   1. Req refill of potassium. EMR was changed by cardiology office but per pharm pt has been on 1 two times a day. OK to update and call in? 2. Needs labs faxed to [facility] office. - DONE Initial call taken by: Lamar Sprinkles, CMA,  October 09, 2010 12:09 PM  Follow-up for Phone Call        please stay with K once a day Follow-up by: Jacques Navy MD,  October 09, 2010 1:20 PM    New/Updated Medications: POTASSIUM CHLORIDE CRYS CR 20 MEQ CR-TABS (POTASSIUM CHLORIDE CRYS CR) 1 once daily Prescriptions: POTASSIUM CHLORIDE CRYS CR 20 MEQ CR-TABS (POTASSIUM CHLORIDE CRYS CR) 1 once daily  #90 x 1   Entered by:   Lamar Sprinkles, CMA   Authorized by:   Jacques Navy MD   Signed by:   Lamar Sprinkles, CMA on 10/09/2010   Method used:   Electronically to        [facility]  [phone]* (retail)       [address]       [place], [place]  [address]       Ph: [phone] or [phone]       Fax: [phone]   RxID:   [id number]

## 2010-10-17 ENCOUNTER — Other Ambulatory Visit: Payer: Self-pay | Admitting: Gastroenterology

## 2010-10-17 ENCOUNTER — Ambulatory Visit (INDEPENDENT_AMBULATORY_CARE_PROVIDER_SITE_OTHER): Payer: Medicare Other | Admitting: Gastroenterology

## 2010-10-17 ENCOUNTER — Encounter: Payer: Self-pay | Admitting: Gastroenterology

## 2010-10-17 DIAGNOSIS — Z860100 Personal history of colon polyps, unspecified: Secondary | ICD-10-CM | POA: Insufficient documentation

## 2010-10-17 DIAGNOSIS — D649 Anemia, unspecified: Secondary | ICD-10-CM

## 2010-10-17 DIAGNOSIS — R195 Other fecal abnormalities: Secondary | ICD-10-CM | POA: Insufficient documentation

## 2010-10-17 DIAGNOSIS — D509 Iron deficiency anemia, unspecified: Secondary | ICD-10-CM

## 2010-10-17 DIAGNOSIS — K921 Melena: Secondary | ICD-10-CM | POA: Insufficient documentation

## 2010-10-21 ENCOUNTER — Ambulatory Visit (HOSPITAL_COMMUNITY)
Admission: RE | Admit: 2010-10-21 | Discharge: 2010-10-21 | Disposition: A | Discharge status: Home / Self Care | Payer: Medicare Other | Source: Ambulatory Visit | Attending: Gastroenterology | Admitting: Gastroenterology

## 2010-10-21 DIAGNOSIS — D649 Anemia, unspecified: Secondary | ICD-10-CM

## 2010-10-21 DIAGNOSIS — I517 Cardiomegaly: Secondary | ICD-10-CM | POA: Insufficient documentation

## 2010-10-21 DIAGNOSIS — R195 Other fecal abnormalities: Secondary | ICD-10-CM

## 2010-10-21 DIAGNOSIS — K921 Melena: Secondary | ICD-10-CM | POA: Insufficient documentation

## 2010-10-21 DIAGNOSIS — R160 Hepatomegaly, not elsewhere classified: Secondary | ICD-10-CM | POA: Insufficient documentation

## 2010-10-21 NOTE — Op Note (Signed)
Summary: Repair of incarcerated right inguinal hernia with mesh  NAME:  [name], [name]              ACCOUNT NO.:  [id number]      MEDICAL RECORD NO.:  [id number]          PATIENT TYPE:  INP      LOCATION:  2906                         FACILITY:  [facility]      PHYSICIAN:  Adolph Pollack, M.D.DATE OF BIRTH:  Dec 29, 1919      DATE OF PROCEDURE:  04/26/2009   DATE OF DISCHARGE:                                  OPERATIVE REPORT      PREOPERATIVE DIAGNOSIS:  Small bowel obstruction secondary to   incarcerated right inguinal hernia.      POSTOPERATIVE DIAGNOSIS:  Small bowel obstruction secondary to   incarcerated right inguinal hernia.      PROCEDURE:  Repair of incarcerated right inguinal hernia with mesh.      SURGEON:  Adolph Pollack, MD      ASSISTANT:  Su Monks, PA student      ANESTHESIA:  General.      INDICATIONS:  This 77 year old female was admitted on April 23, 2009, with some nausea and vomiting.  She was noted to have a large   right bulge today.  She has incarcerated right inguinal hernia, is now   brought for emergency repair.  She has significant comorbidities and   thus operative risk is much higher and this was explained to the patient   and family and they understand this.      TECHNIQUE:  She was brought to the operating room and placed supine on   the operating table.  General anesthetic administered.  Hair in the left   groin was clipped and groin and abdominal wall were sterilely prepped   and draped.  The hernia bulge was notable.  The left groin incision was   made through the skin and subcutaneous tissue.  Incision was made in the   external oblique aponeurosis through the external ring medially and   towards the anterior and superior iliac spine laterally.  I was unable   to manually reduce the hernia and examined the hernia contents.  I began   dissecting tissue off the hernia sac and then the hernia spontaneously   reduced.  I then  opened the sac and had some old blood tinged fluid,   which I evacuated.  I was then able to grasp the small bowel and   identified the segment of small bowel was in the hernia and note the   point of obstruction with some proximal dilatation and distal normal   caliber bowel.  The bowel was viable, thus no resection was needed.  The   bowel was replaced back in the peritoneal cavity.  I then placed a   pursestring suture around the sac, tying this down, and closing the   peritoneal sac.  Excess sac was excised and sent to pathology.      Because there was no significant inflammatory change, infection, or need   for bowel resection, I felt I could repair the hernia with mesh.  The   round ligament was isolated  and window created around it and the   ilioinguinal nerve kept with the round ligament.  This was retracted   anteriorly.  A piece of 3 x 6 inch polypropylene mesh was brought into   the field and anchored 2 cm medial to the pubic tubercle with 2-0   Prolene suture.  The inferior aspect of the mesh was anchored with the   shelving edge of the inguinal ligament with running 2-0 Prolene suture   up to level 2 cm lateral to the internal ring.  A slit was cut in the   mesh and wrapped around the round ligament.  The superior aspect of the   mesh was anchored to the internal oblique aponeurosis with interrupted 2-   0 Vicryl suture.  Two tails of the mesh were crossed creating a new   internal ring and this was anchored to the shelving edge of the inguinal   ligament with 2-0 Prolene suture.  The lateral aspect of the mesh was   then tucked deep to the external oblique aponeurosis.      The area was inspected and hemostasis was adequate.  The external   oblique aponeurosis was closed over the mesh and round ligament with   running 3-0 Vicryl suture.  The subcutaneous tissue was closed with a   running 2-0 Vicryl suture.  The skin was closed with 4-0 Monocryl   subcuticular stitch  followed by Steri-Strips and sterile dressings.      She tolerated the procedure well without any apparent complications and   was taken to recovery room in satisfactory condition.               Adolph Pollack, M.D.   Electronically Signed            TJR/MEDQ  D:  04/26/2009  T:  04/27/2009  Job:  [id number]      cc:   Rosalyn Gess. Norins, MD   [facility]

## 2010-10-21 NOTE — Progress Notes (Signed)
Summary: Questions  Phone Note Call from Patient Call back at [phone]   Caller: Patient Call For: Dr. Russella Dar Reason for Call: Talk to Nurse Summary of Call: Patient needs to come in today to get labs drawn and she needs to know if she can eat before? Initial call taken by: [place] Johnson,  October 14, 2010 9:01 AM  Follow-up for Phone Call        does not need to be fasting.  daughter wants to know what otehr labs are being drawn.  I did advise her that I can see a Hgb and Hct ordered by Dr Debby Bud.  She thinks there are additional labs that need drawn per Dr Arbutus Ped.  I have asked her to please contact Dr Debby Bud or Dr Arbutus Ped office. Follow-up by: Darcey Nora RN, CGRN,  October 14, 2010 9:20 AM

## 2010-10-21 NOTE — Discharge Summary (Signed)
Summary: Acute coronary syndrome with 3-vessel coronary artery disease  NAME:  [name], [name]              ACCOUNT NO.:  [id number]      MEDICAL RECORD NO.:  [id number]          PATIENT TYPE:  INP      LOCATION:  2034                         FACILITY:  [facility]      PHYSICIAN:  Antionette Char, MD    DATE OF BIRTH:  06/13/1920      DATE OF ADMISSION:  11/07/2007   DATE OF DISCHARGE:  11/24/2007                                  DISCHARGE SUMMARY      FINAL DIAGNOSES:   1. Acute coronary syndrome with 3-vessel coronary artery disease.   2. Mitral valve disease with severe mitral regurgitation.   3. Congestive heart failure, improved.   4. Acute gastrointestinal bleed, unknown site with marked anemia, now       stable.      REASON FOR ADMISSION:  This 77 year old female was admitted on November 07, 2007, after the onset of marked weakness, fatigue, and shortness of   breath.  In the emergency room, she was found to have severe anemia with   a hemoglobin of 5.5 and hematocrit of 25.4.  She has a history of   chronic atrial fibrillation and had been on Coumadin for several years.   Her pro time was noted to be elevated.  Also, her cardiac enzymes were   abnormal in the emergency room.      HOSPITAL COURSE:  After receiving 5 units of packed red blood cells, the   patient was admitted to the hospital for further care.  Her lungs showed   marked changes of congestive heart failure with rales and dullness in   the bases, and a CT scan done from the emergency room showed pleural   fluid and ascites.  There was no evidence for retroperitoneal hematoma.   A GI consultation was obtained by Dr. Sabino Gasser who performed an EGD on   November 09, 2007, which showed clear upper GI and no evidence for bleeding   site.  With her cardiac enzymes being abnormal and the presence of   severe congestive failure, he felt that a colonoscopy at that time was   at too high risk.  We then performed a cardiac cath on  November 10, 2007, to   define her cardiac status and at the cath we found 3-vessel coronary   artery disease and severe mitral insufficiency with severely dilated   left atrium and right atrium.  Her left ventricular ejection fraction   and function continued to be normal with a 60% ejection fraction and she   had mild-to-moderate cardiomegaly.  With these findings, we obtained a   CT surgical consultation by Dr. Cornelius Moras and he felt that her anemia and GI   status was too unstable to pursue surgery at this time.  We then   obtained a hematology consultation who felt that we should proceed with   the colonoscopy procedure, and then if no further clear etiology could   be found, he would consider her for bone marrow biopsy to rule  out a   myelodysplastic syndrome.  We then recalled Dr. Virginia Rochester who performed a   colonoscopy on November 15, 2007, and aside from several small polyps of the   ascending colon and sigmoid colon, he felt that she was stable   gastrointestinal wise.  He felt that these polyps were at the small risk   of bleeding.  He then recalled CT surgery and again they felt that the   risks, benefits of surgery at this time were too great and wanted her to   continue further medical management and rehabilitation and follow up in   several weeks after discharge for further consideration of surgery.  We   then restarted her on Coumadin and we will plan to keep her pro time in   a low therapeutic range.  We have continued to treat her congestive   heart failure with diuretics and beta-blocker and she has made gradual   improvement with the aid of physical therapy.  She has gained strength   and is now tolerating walking in the hall.  Social service has now   arranged for home health care with visiting RN and visiting aide and   physical therapy, and we will plan to discharge her today home in   improved and stable condition.  Another incidental finding during the   admission was an elevated  fasting blood sugar, and she has been covered   with insulin on a sensitive scale.  Yesterday, we also started metformin   500 mg daily, and we will plan to continue this at home and follow up   her diabetic condition early next week.  We will also check her pro time   early next week.      DISPOSITION ON DISCHARGE:      DIET:  Heart-healthy diet.      ACTIVITY:  Increase activity slowly as tolerated.      MEDICATIONS:   1. Metformin 500 mg daily.   2. Metoprolol 50 mg b.i.d.   3. Lisinopril 2.5 mg daily.   4. Isosorbide mononitrate 30 mg daily.   5. Simvastatin 20 mg nightly.   6. Ambien 5 mg nightly.   7. Coumadin 5 mg daily.   8. Furosemide 40 mg daily.   9. K-Dur 10 mEq daily.   10.Avalide 300/25 daily.      Followup appointment with an office visit on November 29, 2007.      CONDITION ON DISCHARGE:  Improved and stable.               Antionette Char, MD   Electronically Signed            JRT/MEDQ  D:  11/24/2007  T:  11/25/2007  Job:  [id number]

## 2010-10-21 NOTE — Progress Notes (Signed)
Summary: STAT LABS TODAY  Phone Note Call from Patient   Caller: Jasmine December [phone] OR [phone] Summary of Call: Pt's daughter called - Very worried that pt has increased fatigue and weakness. Wants labs today to be STAT and pt to wait in the office for results. Changed labs to stat and advised to have pt to wait in the office, MD aware and agrees.  Initial call taken by: Lamar Sprinkles, CMA,  October 14, 2010 11:28 AM  Follow-up for Phone Call        Hgb was 8.2. PER MD keep apt with GI friday. Pt & daughter aware. Follow-up by: Lamar Sprinkles, CMA,  October 14, 2010 3:51 PM  Additional Follow-up for Phone Call Additional follow up Details #1::        Hgb 2/28 was 8.8 - minimal drop. Additional Follow-up by: Jacques Navy MD,  October 14, 2010 5:25 PM

## 2010-10-21 NOTE — Op Note (Signed)
Summary: Upper Endoscopy  NAME:  [name], [name]              ACCOUNT NO.:  [id number]      MEDICAL RECORD NO.:  [id number]          PATIENT TYPE:  INP      LOCATION:  2913                         FACILITY:  [facility]      PHYSICIAN:  Georgiana Spinner, M.D.    DATE OF BIRTH:  07/31/1920      DATE OF PROCEDURE:   DATE OF DISCHARGE:                                  OPERATIVE REPORT      PROCEDURE:  Upper endoscopy.      INDICATIONS:  GI blood loss presumed.      ANESTHESIA:  Fentanyl 50 mcg, Versed 5 mg.      PROCEDURE:  With the patient mildly sedated in the left lateral   decubitus position, the Pentax videoscopic endoscope was inserted in the   mouth, passed under direct vision through the esophagus, which appeared   normal, into the stomach.  Fundus, body, antrum, all appeared normal as   did duodenal bulb, second portion of the duodenum.  There was no   evidence of bleeding.  There was bile seen throughout.  From this point   the endoscope was slowly withdrawn taking circumferential views of   duodenal mucosa until the endoscope been pulled back into stomach,   placed in retroflexion to view the stomach from below.  The endoscope   was straightened and withdrawn taking circumferential views of the   remaining gastric and esophageal mucosa.  The patient's vital signs and   pulse oximeter remained stable.  The patient tolerated the procedure   well without apparent complications.      FINDINGS:  Negative exam.  At this point would switch from Protonix   infusion to once-a-day Protonix but will liberalize diet.  There is no   evidence of active upper GI bleeding at this point so I think   anticoagulation can be resumed, and apparently there was no   retroperitoneal hemorrhage noted either.  At this point I have no   further evaluation anticipated and will be available as needed.  Please   call for further follow-up.  Given the current state of her cardiac   process, workup such as  colonoscopy I think would probably not be   valuable to her unless her cardiac status would be markedly improved.                  ______________________________   Georgiana Spinner, M.D.            GMO/MEDQ  D:  11/09/2007  T:  11/09/2007  Job:  [id number]

## 2010-10-21 NOTE — Op Note (Signed)
Summary: Colonoscopy  NAME:  [name], [name]              ACCOUNT NO.:  [id number]      MEDICAL RECORD NO.:  [id number]          PATIENT TYPE:  INP      LOCATION:  2034                         FACILITY:  [facility]      PHYSICIAN:  Georgiana Spinner, M.D.    DATE OF BIRTH:  02-13-1920      DATE OF PROCEDURE:   DATE OF DISCHARGE:                                  OPERATIVE REPORT      PROCEDURE:  Colonoscopy.      INDICATIONS:  Rectal bleeding.      ANESTHESIA:  Fentanyl 25 mcg, Versed 4 mg.      INDICATIONS:  A patient with recent anemia and concern for rectal   bleeding with possibility of facing cardiac surgery and anticoagulation.   Anesthesia was given.      PROCEDURE:  With the patient mildly sedated in the left lateral   decubitus position and the Pentax videoscopic pediatric colonoscope was   inserted in the rectum and passed under direct vision to the cecum   identified by ileocecal valve and appendiceal orifice both of which were   photographed.  From this point the colonoscope was slowly withdrawn   taking circumferential views of colonic mucosa, stopping in the   ascending colon where two polyps were seen and photographed only.  The   endoscope was then further withdrawn taking circumferential views of the   remaining colonic mucosa stopping at approximately 30 cm from the anal   verge at which point a larger polyp was seen on a stalk approximately 1   cm in size.  It too was photographed only. The endoscope was withdrawn   to the rectum which appeared normal and showed hemorrhoids on   retroflexed view.      The endoscope was straightened and withdrawn.  The patient's vital signs   and pulse oximeter remained stable.  The patient tolerated the procedure   well without apparent complication.      FINDINGS:  Polyps as described above.      PLAN:  At this point I elected not to remove these polyps.  They appear   to be benign and can be removed at a later date.  We will allow    cardiology and cardiovascular surgery to determine the immediate course   of action.  If I had removed the polyps then anticoagulation would have   to be precluded for another 2 weeks, presumably ideally and, therefore,   at this point I elected to consider doing a repeat colonoscopy at a much   later date and remove the polyps if that proves to be clinically   relevant.                  ______________________________   Georgiana Spinner, M.D.            GMO/MEDQ  D:  11/15/2007  T:  11/15/2007  Job:  [id number]      cc:   Salvatore Decent. Cornelius Moras, M.D.

## 2010-10-21 NOTE — Assessment & Plan Note (Signed)
Summary: chronic anemia   History of Present Illness Visit Type: Follow-up Visit Primary GI MD: Elie Goody MD [facility] Primary Provider: Illene Regulus, MD Requesting Provider: Illene Regulus, MD Chief Complaint: chronic anemia, was on Aleve daily but has not taken in the past 6 wks after being d/c'd by Dr. Debby Bud.   History of Present Illness:    This is an 77 year old female who is here today with her daughter. She returns for further evaluation of Hemoccult-positive stool and chronic iron deficiency anemia. She has had prior colonoscopy showing polyps and upper endoscopy which was normal with Dr. Sabino Gasser in 11/2007. I performed a colonoscopy in 10/2009 that showed colon polyps and internal hemorrhoids.  She was maintained on Aleve long-term and this was discontinued about 6 weeks ago.  She notes occasional small amounts of rectal bleeding on rare occasion. She has been evaluated by Dr. Arbutus Ped and was felt to have chronic anemia with a component of iron deficiency. Oral iron supplementation has been recommended as however recently intravenous iron therapy has been considered.  She was recently started on a prescription iron and over-the-counter iron was discontinued.   GI Review of Systems    Reports nausea.      Denies abdominal pain, acid reflux, belching, bloating, chest pain, dysphagia with liquids, dysphagia with solids, heartburn, loss of appetite, vomiting, vomiting blood, weight loss, and  weight gain.      Reports heme positive stool and  rectal bleeding.     Denies anal fissure, black tarry stools, change in bowel habit, constipation, diarrhea, diverticulosis, fecal incontinence, hemorrhoids, irritable bowel syndrome, jaundice, light color stool, liver problems, and  rectal pain.   Current Medications (verified): 1)  Metoprolol Tartrate 50 Mg Tabs (Metoprolol Tartrate) .Marland Kitchen.. 1 Tab Two Times A Day 2)  Lanoxin 0.125 Mg  Tabs (Digoxin) .... Take 1 Tablet By Mouth Once A Day 3)   Furosemide 40 Mg  Tabs (Furosemide) .... Take 1 Tablet By Mouth Two Times A Day 4)  Metformin Hcl 500 Mg  Tb24 (Metformin Hcl) .... Take 1 Tablet By Mouth Once A Day 5)  Avapro 300 Mg Tabs (Irbesartan) .Marland Kitchen.. 1 Once Daily 6)  Fexofenadine Hcl 180 Mg Tabs (Fexofenadine Hcl) .... As Needed 7)  Simvastatin 20 Mg  Tabs (Simvastatin) .... Take 1 Tablet By Mouth Once A Day 8)  Doxazosin Mesylate 4 Mg Tabs (Doxazosin Mesylate) .Marland Kitchen.. 1 By Mouth At Bedtime For Blood Pressure 9)  Trazodone Hcl 50 Mg Tabs (Trazodone Hcl) .Marland Kitchen.. 1 To 2 Tabs At Bedtime As Needed 10)  Clonazepam 0.5 Mg Tabs (Clonazepam) .Marland Kitchen.. 1 Tab Two Times A Day 11)  Temazepam 15 Mg Caps (Temazepam) .Marland Kitchen.. 1 At Bedtime 12)  Potassium Chloride Crys Cr 20 Meq Cr-Tabs (Potassium Chloride Crys Cr) .Marland Kitchen.. 1 Once Daily 13)  Metolazone 2.5 Mg Tabs (Metolazone) .Marland Kitchen.. 1 Tab M-W-F 30 Minutes Before Am Furosemide. 14)  Nexium 40 Mg Cpdr (Esomeprazole Magnesium) .... Take 1 Every Morning 15)  Norco 5-325 Mg Tabs (Hydrocodone-Acetaminophen) .Marland Kitchen.. 1 By Mouth Q 6 Hours As Needed Pain. 16)  Integra F 125-1 Mg Caps (Fe Fum-Fepoly-Fa-Vit C-Vit B3) .Marland Kitchen.. 1 By Mouth Once Daily  Allergies (verified): 1)  ! Codeine  Past History:  Past Medical History: Reviewed history from 10/13/2010 and no changes required. INGUINAL HERNIORRHAPHY, RIGHT, HX OF (ICD-V45.89) MITRAL VALVE INSUFF&AORTIC VALVE INSUFF (ICD-396.3) HYPERLIPIDEMIA-MIXED (ICD-272.4) OTHER AND UNSPECIFIED HYPERLIPIDEMIA (ICD-272.4) PNEUMONIA, HX OF (ICD-V12.60) OSTEOARTHRITIS (ICD-715.90) NEPHROLITHIASIS, HX OF (ICD-V13.01) HYPERTENSION (ICD-401.9) DIABETES MELLITUS, TYPE II (  ICD-250.00) CORONARY ARTERY DISEASE (ICD-414.00) CONGESTIVE HEART FAILURE (ICD-428.0) ATRIAL FIBRILLATION (ICD-427.31) ASTHMA (ICD-493.90) ANEMIA-NOS (ICD-285.9) ALLERGIC RHINITIS (ICD-477.9) Adenomatous Colon Polyps 10/2009  Past Surgical History: Reviewed history from 05/28/2009 and no changes required. breast cysts w/  aspiration x 50 Hysterectomy 1990 Lumbar fusion'07 [facility]) Total hip replacement-right Valentina Gu) June 2008 Right inquinal hernia repair '10 ([facility])  Family History: Reviewed history from 04/23/2009 and no changes required. Father:decease CHF Mother:decease pneumonia Siblings:1 brother good health No FH of Colon Cancer: Lymphoma-Sister  Social History: Reviewed history from 09/27/2008 and no changes required. married '42 -38 years-widowed 1 son -'31, 1 daughter '41 Lives alone - I-ADLs, including driving End-of-Life issues: no prolonged mechanical vent. support. Laymen's Guide to Death With Dignity provided with a suggestion that she discuss these issues with her family. Her son was supportive of having this discussion. 4 grandchildren Tobacco Use - No.  Alcohol Use - no Regular Exercise - no  Review of Systems       The patient complains of anemia and fatigue.         The pertinent positives and negatives are noted as above and in the HPI. All other ROS were reviewed and were negative.   Vital Signs:  Patient profile:   77 year old female Height:      64 inches Weight:      120 pounds BMI:     20.67 Pulse rate:   72 / minute Pulse rhythm:   regular BP sitting:   130 / 58  (left arm)  Vitals Entered By: Milford Cage NCMA (October 17, 2010 11:25 AM)  Physical Exam  General:   Elderly, thin and chronically ill-appearing white female in a wheelchair Head:  Normocephalic and atraumatic. Eyes:  PERRLA, no icterus. Mouth:  No deformity or lesions, dentition normal. Lungs:  Clear throughout to auscultation. Heart:  Regular rate and rhythm; no murmurs, rubs,  or bruits. Abdomen:  Soft, nontender and nondistended. No masses, hepatosplenomegaly or hernias noted. Normal bowel sounds. Neurologic:  Alert and  oriented x4;  grossly normal neurologically. Cervical Nodes:  No significant cervical adenopathy. Inguinal Nodes:  No significant inguinal adenopathy. Psych:  Alert  and cooperative. Normal mood and affect.  Impression & Recommendations:  Problem # 1:  ANEMIA (ICD-285.9)  Chronic anemia with Hemoccult-positive stools with a component of iron deficiency. She may have chronic gastrointestinal losses from small intestinal AVMs or another small intestinal source. I discussed with her the possibility of capsule endoscopy and she declined because she cannot swallow large pills. EGD to evaluate for gastric and duodenal sources of blood loss and schedule a small bowel follow-through to evaluate for small intestinal lesions.   The risks, benefits, and alternatives of upper endoscopy with possible biopsy and possible dilation were discussed with the patient and her daughter and the patient consents to proceed. They would like to wait for the results of the small bowel follow-through before scheduling upper endoscopy. No need to repeat colonoscopy at this time. She will need further followup with Dr. Shirline Frees for consideration of intravenous iron replacement if her oral iron replacement is not effective. Orders: Small Bowel Follow Through (Sm Bowel FT)  Problem # 2:  FECAL OCCULT BLOOD (ICD-792.1)  See above. Orders: Small Bowel Follow Through (Sm Bowel FT)  Problem # 3:  HEMATOCHEZIA (ICD-578.1)  Small-volume hematochezia likely secondary to known internal hemorrhoids.  May use over-the-counter hemorrhoidal suppositories daily as needed. This is not likely to be the source of her chronic anemia and  iron deficiency.  Problem # 4:  PERSONAL HX COLONIC POLYPS (ICD-V12.72)  History of adenomatous colon polyps. No plans for routine surveillance colonoscopy given her age and comorbidities.  Patient Instructions: 1)  Copy sent to : Illene Regulus MD 2)                        Orlena Sheldon, MD 3)  You have been scheduled for a Small Bowel Follow Through  4)  Xray. Instructions have been provided. 5)  Per your request the Endoscopy will not be scheduled until after the  Xray. 6)  The medication list was reviewed and reconciled.  All changed / newly prescribed medications were explained.  A complete medication list was provided to the patient / caregiver.

## 2010-10-26 LAB — GLUCOSE, CAPILLARY
Glucose-Capillary: 130 mg/dL — ABNORMAL HIGH (ref 70–99)
Glucose-Capillary: 184 mg/dL — ABNORMAL HIGH (ref 70–99)
Glucose-Capillary: 191 mg/dL — ABNORMAL HIGH (ref 70–99)
Glucose-Capillary: 199 mg/dL — ABNORMAL HIGH (ref 70–99)
Glucose-Capillary: 260 mg/dL — ABNORMAL HIGH (ref 70–99)
Glucose-Capillary: 275 mg/dL — ABNORMAL HIGH (ref 70–99)
Glucose-Capillary: 321 mg/dL — ABNORMAL HIGH (ref 70–99)
Glucose-Capillary: 122 mg/dL — ABNORMAL HIGH (ref 70–99)
Glucose-Capillary: 130 mg/dL — ABNORMAL HIGH (ref 70–99)
Glucose-Capillary: 149 mg/dL — ABNORMAL HIGH (ref 70–99)
Glucose-Capillary: 159 mg/dL — ABNORMAL HIGH (ref 70–99)
Glucose-Capillary: 160 mg/dL — ABNORMAL HIGH (ref 70–99)
Glucose-Capillary: 196 mg/dL — ABNORMAL HIGH (ref 70–99)
Glucose-Capillary: 208 mg/dL — ABNORMAL HIGH (ref 70–99)

## 2010-10-26 LAB — CBC
HCT: 25.2 % — ABNORMAL LOW (ref 36.0–46.0)
HCT: 26.3 % — ABNORMAL LOW (ref 36.0–46.0)
HCT: 25.4 % — ABNORMAL LOW (ref 36.0–46.0)
Hemoglobin: 7.5 g/dL — ABNORMAL LOW (ref 12.0–15.0)
Hemoglobin: 8.6 g/dL — ABNORMAL LOW (ref 12.0–15.0)
Hemoglobin: 8.3 g/dL — ABNORMAL LOW (ref 12.0–15.0)
MCH: 30.6 pg (ref 26.0–34.0)
MCHC: 32.6 g/dL (ref 30.0–36.0)
MCHC: 33.2 g/dL (ref 30.0–36.0)
MCHC: 32.7 g/dL (ref 30.0–36.0)
MCV: 93.6 fL (ref 78.0–100.0)
MCV: 80.7 fL (ref 78.0–100.0)
Platelets: 123 10*3/uL — ABNORMAL LOW (ref 150–400)
Platelets: 132 10*3/uL — ABNORMAL LOW (ref 150–400)
Platelets: 148 10*3/uL — ABNORMAL LOW (ref 150–400)
Platelets: 183 10*3/uL (ref 150–400)
RBC: 2.8 MIL/uL — ABNORMAL LOW (ref 3.87–5.11)
RBC: 3.14 MIL/uL — ABNORMAL LOW (ref 3.87–5.11)
RDW: 19.1 % — ABNORMAL HIGH (ref 11.5–15.5)
RDW: 20.6 % — ABNORMAL HIGH (ref 11.5–15.5)
RDW: 18.4 % — ABNORMAL HIGH (ref 11.5–15.5)
WBC: 4.9 10*3/uL (ref 4.0–10.5)

## 2010-10-26 LAB — MAGNESIUM
Magnesium: 2.1 mg/dL (ref 1.5–2.5)
Magnesium: 1.9 mg/dL (ref 1.5–2.5)

## 2010-10-26 LAB — COMPREHENSIVE METABOLIC PANEL
ALT: 18 U/L (ref 0–35)
AST: 24 U/L (ref 0–37)
Albumin: 3.3 g/dL — ABNORMAL LOW (ref 3.5–5.2)
Albumin: 3.4 g/dL — ABNORMAL LOW (ref 3.5–5.2)
BUN: 21 mg/dL (ref 6–23)
CO2: 32 mEq/L (ref 19–32)
Calcium: 9.1 mg/dL (ref 8.4–10.5)
Chloride: 93 mEq/L — ABNORMAL LOW (ref 96–112)
Chloride: 98 mEq/L (ref 96–112)
Creatinine, Ser: 0.73 mg/dL (ref 0.4–1.2)
GFR calc Af Amer: 57 mL/min — ABNORMAL LOW (ref >60)
GFR calc Af Amer: >60 mL/min (ref >60)
GFR calc non Af Amer: 47 mL/min — ABNORMAL LOW (ref >60)
Glucose, Bld: 170 mg/dL — ABNORMAL HIGH (ref 70–99)
Potassium: 3.6 mEq/L (ref 3.5–5.1)
Sodium: 134 mEq/L — ABNORMAL LOW (ref 135–145)
Sodium: 137 mEq/L (ref 135–145)
Total Bilirubin: 0.9 mg/dL (ref 0.3–1.2)
Total Protein: 6.1 g/dL (ref 6.0–8.3)

## 2010-10-26 LAB — POCT I-STAT, CHEM 8
Calcium, Ion: 1.03 mmol/L — ABNORMAL LOW (ref 1.12–1.32)
Chloride: 103 mEq/L (ref 96–112)
Glucose, Bld: 153 mg/dL — ABNORMAL HIGH (ref 70–99)
HCT: 30 % — ABNORMAL LOW (ref 36.0–46.0)
Hemoglobin: 10.2 g/dL — ABNORMAL LOW (ref 12.0–15.0)
Potassium: 3.6 mEq/L (ref 3.5–5.1)

## 2010-10-26 LAB — BASIC METABOLIC PANEL
BUN: 28 mg/dL — ABNORMAL HIGH (ref 6–23)
BUN: 17 mg/dL (ref 6–23)
BUN: 18 mg/dL (ref 6–23)
CO2: 29 mEq/L (ref 19–32)
Calcium: 9.4 mg/dL (ref 8.4–10.5)
Calcium: 8.9 mg/dL (ref 8.4–10.5)
Calcium: 9.3 mg/dL (ref 8.4–10.5)
Chloride: 96 mEq/L (ref 96–112)
Chloride: 99 mEq/L (ref 96–112)
Creatinine, Ser: 0.81 mg/dL (ref 0.4–1.2)
Creatinine, Ser: 0.66 mg/dL (ref 0.4–1.2)
Creatinine, Ser: 0.81 mg/dL (ref 0.4–1.2)
Creatinine, Ser: 0.89 mg/dL (ref 0.4–1.2)
GFR calc Af Amer: >60 mL/min (ref >60)
GFR calc Af Amer: >60 mL/min (ref >60)
GFR calc non Af Amer: >60 mL/min (ref >60)
GFR calc non Af Amer: >60 mL/min (ref >60)
GFR calc non Af Amer: >60 mL/min (ref >60)
Glucose, Bld: 164 mg/dL — ABNORMAL HIGH (ref 70–99)
Glucose, Bld: 253 mg/dL — ABNORMAL HIGH (ref 70–99)
Glucose, Bld: 116 mg/dL — ABNORMAL HIGH (ref 70–99)
Potassium: 3 mEq/L — ABNORMAL LOW (ref 3.5–5.1)
Potassium: 3.7 mEq/L (ref 3.5–5.1)
Potassium: 2.9 mEq/L — ABNORMAL LOW (ref 3.5–5.1)
Sodium: 140 mEq/L (ref 135–145)
Sodium: 141 mEq/L (ref 135–145)

## 2010-10-26 LAB — CROSSMATCH
ABO/RH(D): A POS
Antibody Screen: POSITIVE
DAT, IgG: NEGATIVE
DAT, IgG: NEGATIVE
Donor AG Type: NEGATIVE
Donor AG Type: NEGATIVE
Donor AG Type: NEGATIVE
PT AG Type: NEGATIVE
PT AG Type: NEGATIVE

## 2010-10-26 LAB — URINALYSIS, ROUTINE W REFLEX MICROSCOPIC
Bilirubin Urine: NEGATIVE
Glucose, UA: >1000 mg/dL — AB
Ketones, ur: NEGATIVE mg/dL
Nitrite: NEGATIVE
Nitrite: NEGATIVE
Protein, ur: NEGATIVE mg/dL
Urobilinogen, UA: 0.2 mg/dL (ref 0.0–1.0)
pH: 7 (ref 5.0–8.0)
pH: 7 (ref 5.0–8.0)

## 2010-10-26 LAB — DIFFERENTIAL
Basophils Absolute: 0 10*3/uL (ref 0.0–0.1)
Basophils Relative: 0 % (ref 0–1)
Eosinophils Absolute: 0.1 10*3/uL (ref 0.0–0.7)
Eosinophils Relative: 1 % (ref 0–5)
Eosinophils Relative: 2 % (ref 0–5)
Lymphocytes Relative: 14 % (ref 12–46)
Lymphocytes Relative: 22 % (ref 12–46)
Lymphs Abs: 1.1 10*3/uL (ref 0.7–4.0)
Monocytes Absolute: 0.6 10*3/uL (ref 0.1–1.0)
Monocytes Relative: 12 % (ref 3–12)
Neutro Abs: 4.6 10*3/uL (ref 1.7–7.7)

## 2010-10-26 LAB — RETICULOCYTES
Retic Count, Absolute: 138 10*3/uL (ref 19.0–186.0)
Retic Ct Pct: 1.5 % (ref 0.4–3.1)

## 2010-10-26 LAB — BRAIN NATRIURETIC PEPTIDE
Pro B Natriuretic peptide (BNP): 437 pg/mL — ABNORMAL HIGH (ref 0.0–100.0)
Pro B Natriuretic peptide (BNP): 593 pg/mL — ABNORMAL HIGH (ref 0.0–100.0)

## 2010-10-26 LAB — IRON AND TIBC
Iron: 163 ug/dL — ABNORMAL HIGH (ref 42–135)
Iron: 22 ug/dL — ABNORMAL LOW (ref 42–135)
Saturation Ratios: 40 % (ref 20–55)
Saturation Ratios: 5 % — ABNORMAL LOW (ref 20–55)
TIBC: 403 ug/dL (ref 250–470)
TIBC: 459 ug/dL (ref 250–470)
UIBC: 437 ug/dL

## 2010-10-26 LAB — CARDIAC PANEL(CRET KIN+CKTOT+MB+TROPI)
CK, MB: 1.4 ng/mL (ref 0.3–4.0)
CK, MB: 2.1 ng/mL (ref 0.3–4.0)
CK, MB: 1.5 ng/mL (ref 0.3–4.0)
CK, MB: 1.5 ng/mL (ref 0.3–4.0)
Relative Index: 1.7 (ref 0.0–2.5)
Relative Index: INVALID (ref 0.0–2.5)
Relative Index: INVALID (ref 0.0–2.5)
Relative Index: INVALID (ref 0.0–2.5)
Total CK: 126 U/L (ref 7–177)
Total CK: 70 U/L (ref 7–177)
Total CK: 82 U/L (ref 7–177)
Total CK: 51 U/L (ref 7–177)
Troponin I: 0.03 ng/mL (ref 0.00–0.06)
Troponin I: 0.02 ng/mL (ref 0.00–0.06)
Troponin I: 0.03 ng/mL (ref 0.00–0.06)

## 2010-10-26 LAB — DIGOXIN LEVEL: Digoxin Level: 0.4 ng/mL — ABNORMAL LOW (ref 0.8–2.0)

## 2010-10-26 LAB — CK TOTAL AND CKMB (NOT AT ARMC)
CK, MB: 1.5 ng/mL (ref 0.3–4.0)
Total CK: 55 U/L (ref 7–177)

## 2010-10-26 LAB — HEMOGLOBIN A1C: Mean Plasma Glucose: 160 mg/dL — ABNORMAL HIGH (ref <117)

## 2010-10-26 LAB — CULTURE, BLOOD (ROUTINE X 2): Culture: NO GROWTH

## 2010-10-26 LAB — HEMOGLOBIN AND HEMATOCRIT, BLOOD
HCT: 30.4 % — ABNORMAL LOW (ref 36.0–46.0)
Hemoglobin: 10.3 g/dL — ABNORMAL LOW (ref 12.0–15.0)
Hemoglobin: 10.7 g/dL — ABNORMAL LOW (ref 12.0–15.0)

## 2010-10-26 LAB — GLUCOSE, RANDOM: Glucose, Bld: 529 mg/dL — ABNORMAL HIGH (ref 70–99)

## 2010-10-26 LAB — URINE MICROSCOPIC-ADD ON

## 2010-10-26 LAB — CALCIUM, IONIZED: Calcium, Ion: 1.24 mmol/L (ref 1.12–1.32)

## 2010-10-26 LAB — VITAMIN B12: Vitamin B-12: 1466 pg/mL — ABNORMAL HIGH (ref 211–911)

## 2010-10-26 LAB — LIPID PANEL
HDL: 28 mg/dL — ABNORMAL LOW (ref >39)
Total CHOL/HDL Ratio: 3 RATIO
Triglycerides: 84 mg/dL (ref <150)

## 2010-10-26 LAB — FOLATE: Folate: >20 ng/mL

## 2010-10-26 LAB — TROPONIN I: Troponin I: 0.02 ng/mL (ref 0.00–0.06)

## 2010-10-28 ENCOUNTER — Telehealth: Payer: Self-pay | Admitting: Internal Medicine

## 2010-10-28 DIAGNOSIS — D509 Iron deficiency anemia, unspecified: Secondary | ICD-10-CM

## 2010-10-28 NOTE — Telephone Encounter (Signed)
Son informed, pt coming in tomorrow for labs. He will call office later to schedule ov to discuss hip pain/surgery

## 2010-10-28 NOTE — Progress Notes (Signed)
Summary: LABS  Phone Note From Other Clinic   Summary of Call: Labs from Dr Fernande Boyden office did not get into last lab order and not enough blood was drawn. Dr Fernande Boyden office will advise pt's family that new order would be put into epic for after office visit with Dr Russella Dar at [facility]   Order needed is Ferritin, Iron, TIBC Dx anemia - Order in [facility] (will fax results when ready) Initial call taken by: Lamar Sprinkles, CMA,  October 16, 2010 2:42 PM

## 2010-10-28 NOTE — Telephone Encounter (Signed)
Son called and left another mess - Before I call them about the labs - Son is req a call from Dr Debby Bud directly regarding whether pt should have left hip replacement. Would you like to discuss this at ov?

## 2010-10-28 NOTE — Telephone Encounter (Signed)
Pt's son called - req to know if pt is due for labs again to check Hgb?

## 2010-10-28 NOTE — Telephone Encounter (Signed)
Needs ov

## 2010-10-28 NOTE — Telephone Encounter (Signed)
Last Hgb was  March 6th. She can come for lab any time she wants today or tomorrow. Please enter a lab for cbc 285.9

## 2010-10-29 ENCOUNTER — Other Ambulatory Visit: Payer: Self-pay | Admitting: Orthopedic Surgery

## 2010-10-29 ENCOUNTER — Ambulatory Visit
Admission: RE | Admit: 2010-10-29 | Discharge: 2010-10-29 | Disposition: A | Discharge status: Home / Self Care | Payer: Medicare Other | Source: Ambulatory Visit | Attending: Orthopedic Surgery | Admitting: Orthopedic Surgery

## 2010-10-29 ENCOUNTER — Other Ambulatory Visit (INDEPENDENT_AMBULATORY_CARE_PROVIDER_SITE_OTHER): Payer: Medicare Other

## 2010-10-29 DIAGNOSIS — D509 Iron deficiency anemia, unspecified: Secondary | ICD-10-CM

## 2010-10-29 DIAGNOSIS — M25552 Pain in left hip: Secondary | ICD-10-CM

## 2010-10-29 LAB — BASIC METABOLIC PANEL
BUN: 18 mg/dL (ref 6–23)
Chloride: 102 mEq/L (ref 96–112)
GFR calc Af Amer: >60 mL/min (ref >60)
Potassium: 3.3 mEq/L — ABNORMAL LOW (ref 3.5–5.1)
Sodium: 141 mEq/L (ref 135–145)

## 2010-10-29 LAB — CBC WITH DIFFERENTIAL/PLATELET
Eosinophils Relative: 1.4 % (ref 0.0–5.0)
HCT: 22.7 % — CL (ref 36.0–46.0)
Lymphs Abs: 0.9 10*3/uL (ref 0.7–4.0)
Monocytes Relative: 6.3 % (ref 3.0–12.0)
Neutrophils Relative %: 78.6 % — ABNORMAL HIGH (ref 43.0–77.0)
Platelets: 191 10*3/uL (ref 150.0–400.0)
RBC: 2.45 Mil/uL — ABNORMAL LOW (ref 3.87–5.11)
WBC: 6.8 10*3/uL (ref 4.5–10.5)

## 2010-10-29 LAB — CBC
HCT: 21.2 % — ABNORMAL LOW (ref 36.0–46.0)
HCT: 24 % — ABNORMAL LOW (ref 36.0–46.0)
Hemoglobin: 6.9 g/dL — CL (ref 12.0–15.0)
MCHC: 32.7 g/dL (ref 30.0–36.0)
MCV: 84.3 fL (ref 78.0–100.0)
MCV: 84.8 fL (ref 78.0–100.0)
Platelets: 115 10*3/uL — ABNORMAL LOW (ref 150–400)
RBC: 2.52 MIL/uL — ABNORMAL LOW (ref 3.87–5.11)
WBC: 10.4 10*3/uL (ref 4.0–10.5)
WBC: 5.3 10*3/uL (ref 4.0–10.5)

## 2010-10-29 LAB — CROSSMATCH
ABO/RH(D): A POS
Antibody Screen: POSITIVE
DAT, IgG: NEGATIVE
Donor AG Type: NEGATIVE

## 2010-10-29 LAB — URINE MICROSCOPIC-ADD ON

## 2010-10-29 LAB — GLUCOSE, CAPILLARY
Glucose-Capillary: 109 mg/dL — ABNORMAL HIGH (ref 70–99)
Glucose-Capillary: 125 mg/dL — ABNORMAL HIGH (ref 70–99)
Glucose-Capillary: 125 mg/dL — ABNORMAL HIGH (ref 70–99)
Glucose-Capillary: 127 mg/dL — ABNORMAL HIGH (ref 70–99)
Glucose-Capillary: 164 mg/dL — ABNORMAL HIGH (ref 70–99)
Glucose-Capillary: 188 mg/dL — ABNORMAL HIGH (ref 70–99)

## 2010-10-29 LAB — DIFFERENTIAL
Basophils Absolute: 0 10*3/uL (ref 0.0–0.1)
Lymphocytes Relative: 7 % — ABNORMAL LOW (ref 12–46)
Neutro Abs: 9.2 10*3/uL — ABNORMAL HIGH (ref 1.7–7.7)

## 2010-10-29 LAB — POCT CARDIAC MARKERS
CKMB, poc: 1 ng/mL (ref 1.0–8.0)
Myoglobin, poc: 59 ng/mL (ref 12–200)
Troponin i, poc: <0.05 ng/mL (ref 0.00–0.09)

## 2010-10-29 LAB — PREPARE RBC (CROSSMATCH)

## 2010-10-29 LAB — COMPREHENSIVE METABOLIC PANEL
Albumin: 2.6 g/dL — ABNORMAL LOW (ref 3.5–5.2)
BUN: 23 mg/dL (ref 6–23)
CO2: 28 mEq/L (ref 19–32)
Chloride: 104 mEq/L (ref 96–112)
Creatinine, Ser: 0.96 mg/dL (ref 0.4–1.2)
GFR calc non Af Amer: 55 mL/min — ABNORMAL LOW (ref >60)
Total Bilirubin: 1.1 mg/dL (ref 0.3–1.2)

## 2010-10-29 LAB — URINALYSIS, ROUTINE W REFLEX MICROSCOPIC
Bilirubin Urine: NEGATIVE
Protein, ur: NEGATIVE mg/dL
Urobilinogen, UA: 0.2 mg/dL (ref 0.0–1.0)

## 2010-10-29 LAB — HEMOGLOBIN AND HEMATOCRIT, BLOOD: HCT: 25.9 % — ABNORMAL LOW (ref 36.0–46.0)

## 2010-10-29 NOTE — Progress Notes (Signed)
Pt's son aware, scheduled for apt tomorrow at 11:30

## 2010-10-30 ENCOUNTER — Observation Stay (HOSPITAL_COMMUNITY)
Admission: RE | Admit: 2010-10-30 | Discharge: 2010-10-31 | Disposition: A | Discharge status: Home / Self Care | Payer: Medicare Other | Source: Ambulatory Visit | Attending: Internal Medicine | Admitting: Internal Medicine

## 2010-10-30 ENCOUNTER — Ambulatory Visit: Payer: Medicare Other | Admitting: Internal Medicine

## 2010-10-30 ENCOUNTER — Telehealth: Payer: Self-pay | Admitting: Gastroenterology

## 2010-10-30 VITALS — BP 100/42 | HR 97 | Temp 98.3°F

## 2010-10-30 DIAGNOSIS — Q279 Congenital malformation of peripheral vascular system, unspecified: Secondary | ICD-10-CM

## 2010-10-30 DIAGNOSIS — D500 Iron deficiency anemia secondary to blood loss (chronic): Secondary | ICD-10-CM

## 2010-10-30 DIAGNOSIS — Q2733 Arteriovenous malformation of digestive system vessel: Secondary | ICD-10-CM | POA: Insufficient documentation

## 2010-10-30 DIAGNOSIS — D649 Anemia, unspecified: Secondary | ICD-10-CM

## 2010-10-30 DIAGNOSIS — I2510 Atherosclerotic heart disease of native coronary artery without angina pectoris: Secondary | ICD-10-CM | POA: Insufficient documentation

## 2010-10-30 DIAGNOSIS — D509 Iron deficiency anemia, unspecified: Secondary | ICD-10-CM

## 2010-10-30 LAB — BASIC METABOLIC PANEL
CO2: 26 mEq/L (ref 19–32)
GFR calc non Af Amer: 38 mL/min — ABNORMAL LOW (ref >60)
Glucose, Bld: 398 mg/dL — ABNORMAL HIGH (ref 70–99)
Potassium: 4.1 mEq/L (ref 3.5–5.1)
Sodium: 135 mEq/L (ref 135–145)

## 2010-10-30 LAB — CBC
HCT: 19.8 % — ABNORMAL LOW (ref 36.0–46.0)
Hemoglobin: 6.2 g/dL — CL (ref 12.0–15.0)
MCHC: 31.3 g/dL (ref 30.0–36.0)

## 2010-10-30 NOTE — Progress Notes (Signed)
Subjective:    Patient ID: [name], female    DOB: Nov 28, 1919, 77 y.o.   MRN: [id number]  HPIPatient with a history of chronic anemia with iron deficiency. She has had colonoscopy that was negative. UGI series was negative. She has declined both EGD and capsule endoscopy. She has had her hemoglobin tracked over time since her last transfusion in January. Most recently her Hgb was 7.6 grams and given her age and comorbidities including CAD she is a candidate for transfusion and is admitted for 24 eval.   Past Medical History  Diagnosis Date  . Inguinal hernia   . Mitral valve insufficiency and aortic valve insufficiency   . Other and unspecified hyperlipidemia   . Pneumonia   . Osteoarthrosis, unspecified whether generalized or localized, unspecified site   . Nephrolithiasis   . Unspecified essential hypertension   . Type II or unspecified type diabetes mellitus without mention of complication, not stated as uncontrolled   . Coronary atherosclerosis of unspecified type of vessel, native or graft   . Congestive heart failure, unspecified   . Atrial fibrillation   . Unspecified asthma   . Anemia, unspecified   . Allergic rhinitis, cause unspecified   . Colon polyps 10/2009   Past Surgical History  Procedure Date  . Breast cyst aspiration     x50  . Abdominal hysterectomy 1990  . Lumbar fusion 2007    [facility]  . Total hip arthroplasty 01/2007    Right - Dr Valentina Gu  . Inguinal hernia repair 2010    [facility]   History   Social History  . Marital Status: Widowed    Spouse Name: N/A    Number of Children: N/A  . Years of Education: N/A   Occupational History  . Not on file.   Social History Main Topics  . Smoking status: Unknown If Ever Smoked  . Smokeless tobacco: Not on file  . Alcohol Use: No  . Drug Use: Not on file  . Sexually Active: Not on file   Other Topics Concern  . Not on file   Social History Narrative   Married '42 - 38 years - Widowed1 son - '50,  1 dtr - '464 grandchildrenRegular Exercise -  NOLives alone - I ADLs, including drivingEnd of life issues: no prolonged mechanical vent. Suport. Laymen's guide to death with dignity provided with a suggestion that she discuss these issues with her family. Her son was supportive of having this discussion.    Review of Systems  Constitutional: Positive for activity change and fatigue.  Eyes: Negative.   Respiratory: Positive for apnea, chest tightness and shortness of breath.   Cardiovascular: Negative for chest pain and palpitations.  Gastrointestinal: Negative for abdominal pain, blood in stool and abdominal distention.  Genitourinary:       [Slowed urinary stream. Not painful Musculoskeletal:       [Left hip pain-constant and similar to when she had right hip pain leading to THR.  Skin: Negative.   Neurological: Positive for dizziness, weakness and headaches. Negative for numbness.  Psychiatric/Behavioral: Negative.        Objective:   Physical Exam  Constitutional: She is oriented to person, place, and time.       Elderly and thin white woman in no acute distress.  HENT:  Head: Normocephalic and atraumatic.  Mouth/Throat: Oropharynx is clear and moist.  Eyes: Conjunctivae and EOM are normal. Pupils are equal, round, and reactive to light.  Neck: Normal range of motion. Neck  supple. No thyromegaly present.  Cardiovascular: Normal rate.   Murmur heard.      IRIR rate but controlled. II/VI systolic murmur at the LSB  Pulmonary/Chest: Effort normal and breath sounds normal.  Abdominal: Soft. Bowel sounds are normal. She exhibits no distension. There is no tenderness. There is no guarding.  Musculoskeletal: She exhibits no edema.       No hip tenderness (just had steroid injection left hip 3/21)  Neurological: She is alert and oriented to person, place, and time.  Skin: Skin is warm and dry.  Psychiatric: She has a normal mood and affect. Her behavior is normal.           Assessment & Plan:  1. Blood loss anemia - most likely due to AVMs.  Plan - type and crossmatch and transfuse 2 units packed red cells  2. Code - status: DNR, DNI

## 2010-10-30 NOTE — Telephone Encounter (Signed)
Spoke with patients daughter and explained that the blood test for Celiac is different from a CBC. Daughter verbalized understanding.

## 2010-10-31 LAB — PREPARE RBC (CROSSMATCH)

## 2010-11-01 LAB — CROSSMATCH
ABO/RH(D): A POS
Donor AG Type: NEGATIVE
Donor AG Type: NEGATIVE

## 2010-11-02 LAB — GLUCOSE, CAPILLARY
Glucose-Capillary: 104 mg/dL — ABNORMAL HIGH (ref 70–99)
Glucose-Capillary: 104 mg/dL — ABNORMAL HIGH (ref 70–99)
Glucose-Capillary: 112 mg/dL — ABNORMAL HIGH (ref 70–99)
Glucose-Capillary: 115 mg/dL — ABNORMAL HIGH (ref 70–99)
Glucose-Capillary: 123 mg/dL — ABNORMAL HIGH (ref 70–99)
Glucose-Capillary: 130 mg/dL — ABNORMAL HIGH (ref 70–99)
Glucose-Capillary: 131 mg/dL — ABNORMAL HIGH (ref 70–99)
Glucose-Capillary: 135 mg/dL — ABNORMAL HIGH (ref 70–99)
Glucose-Capillary: 136 mg/dL — ABNORMAL HIGH (ref 70–99)
Glucose-Capillary: 155 mg/dL — ABNORMAL HIGH (ref 70–99)
Glucose-Capillary: 174 mg/dL — ABNORMAL HIGH (ref 70–99)
Glucose-Capillary: 257 mg/dL — ABNORMAL HIGH (ref 70–99)
Glucose-Capillary: 95 mg/dL (ref 70–99)
Glucose-Capillary: 96 mg/dL (ref 70–99)
Glucose-Capillary: 97 mg/dL (ref 70–99)

## 2010-11-02 LAB — CBC
Hemoglobin: 8.6 g/dL — ABNORMAL LOW (ref 12.0–15.0)
Hemoglobin: 9 g/dL — ABNORMAL LOW (ref 12.0–15.0)
MCHC: 32.9 g/dL (ref 30.0–36.0)
MCHC: 33.3 g/dL (ref 30.0–36.0)
MCV: 87.9 fL (ref 78.0–100.0)
MCV: 88.1 fL (ref 78.0–100.0)
MCV: 88.1 fL (ref 78.0–100.0)
Platelets: 131 10*3/uL — ABNORMAL LOW (ref 150–400)
Platelets: 131 10*3/uL — ABNORMAL LOW (ref 150–400)
RBC: 2.97 MIL/uL — ABNORMAL LOW (ref 3.87–5.11)
RBC: 3.03 MIL/uL — ABNORMAL LOW (ref 3.87–5.11)
RBC: 3.08 MIL/uL — ABNORMAL LOW (ref 3.87–5.11)
RBC: 3.44 MIL/uL — ABNORMAL LOW (ref 3.87–5.11)
RDW: 20.7 % — ABNORMAL HIGH (ref 11.5–15.5)
WBC: 3.6 10*3/uL — ABNORMAL LOW (ref 4.0–10.5)
WBC: 4.2 10*3/uL (ref 4.0–10.5)
WBC: 4.7 10*3/uL (ref 4.0–10.5)
WBC: 4.8 10*3/uL (ref 4.0–10.5)

## 2010-11-02 LAB — BASIC METABOLIC PANEL
BUN: 7 mg/dL (ref 6–23)
CO2: 27 mEq/L (ref 19–32)
Calcium: 8.8 mg/dL (ref 8.4–10.5)
Calcium: 9.1 mg/dL (ref 8.4–10.5)
Chloride: 101 mEq/L (ref 96–112)
Chloride: 108 mEq/L (ref 96–112)
Creatinine, Ser: 0.71 mg/dL (ref 0.4–1.2)
GFR calc Af Amer: >60 mL/min (ref >60)
GFR calc non Af Amer: >60 mL/min (ref >60)
Glucose, Bld: 128 mg/dL — ABNORMAL HIGH (ref 70–99)
Potassium: 3.6 mEq/L (ref 3.5–5.1)
Sodium: 137 mEq/L (ref 135–145)

## 2010-11-02 LAB — DIFFERENTIAL
Basophils Absolute: 0 10*3/uL (ref 0.0–0.1)
Basophils Relative: 1 % (ref 0–1)
Eosinophils Absolute: 0.1 10*3/uL (ref 0.0–0.7)
Monocytes Absolute: 0.4 10*3/uL (ref 0.1–1.0)
Monocytes Relative: 11 % (ref 3–12)
Neutrophils Relative %: 71 % (ref 43–77)

## 2010-11-02 LAB — PROTIME-INR
INR: 1.14 (ref 0.00–1.49)
Prothrombin Time: 14.5 seconds (ref 11.6–15.2)

## 2010-11-02 LAB — HEMOGLOBIN AND HEMATOCRIT, BLOOD: HCT: 28.8 % — ABNORMAL LOW (ref 36.0–46.0)

## 2010-11-02 LAB — HEMOGLOBIN: Hemoglobin: 9 g/dL — ABNORMAL LOW (ref 12.0–15.0)

## 2010-11-02 LAB — BRAIN NATRIURETIC PEPTIDE: Pro B Natriuretic peptide (BNP): 329 pg/mL — ABNORMAL HIGH (ref 0.0–100.0)

## 2010-11-03 ENCOUNTER — Telehealth: Payer: Self-pay | Admitting: *Deleted

## 2010-11-03 ENCOUNTER — Ambulatory Visit: Payer: Medicare Other | Admitting: Internal Medicine

## 2010-11-03 DIAGNOSIS — D649 Anemia, unspecified: Secondary | ICD-10-CM

## 2010-11-03 NOTE — Telephone Encounter (Signed)
Son left vm - Pt had transfusion but seems to be dizzy for the last 2 days. Wants to know if this is normal after transfusion?

## 2010-11-04 ENCOUNTER — Ambulatory Visit (INDEPENDENT_AMBULATORY_CARE_PROVIDER_SITE_OTHER): Payer: Medicare Other | Admitting: Internal Medicine

## 2010-11-04 ENCOUNTER — Other Ambulatory Visit (INDEPENDENT_AMBULATORY_CARE_PROVIDER_SITE_OTHER): Payer: Medicare Other

## 2010-11-04 VITALS — BP 152/50 | HR 63 | Temp 97.8°F

## 2010-11-04 DIAGNOSIS — D649 Anemia, unspecified: Secondary | ICD-10-CM

## 2010-11-04 LAB — CBC WITH DIFFERENTIAL/PLATELET
Basophils Absolute: 0 10*3/uL (ref 0.0–0.1)
HCT: 30.5 % — ABNORMAL LOW (ref 36.0–46.0)
Lymphs Abs: 1 10*3/uL (ref 0.7–4.0)
Monocytes Absolute: 0.5 10*3/uL (ref 0.1–1.0)
Monocytes Relative: 7.2 % (ref 3.0–12.0)
Platelets: 176 10*3/uL (ref 150.0–400.0)
RDW: 16.5 % — ABNORMAL HIGH (ref 11.5–14.6)

## 2010-11-04 NOTE — Telephone Encounter (Signed)
np -dizziness is not a usual side effect of transfusion. If she is feeling bad she will need OV with CBC prior to visit. If it is mild dizziness associated with positin chnge no treatment needed. If it is dysequilibrium with movement she can try 1/2 bonine q 6.

## 2010-11-04 NOTE — Telephone Encounter (Signed)
Spoke w/son, pt is very worried that she will fall and has limited her activities. Family is very concerned, scheduled for ov today w/CBC prior.

## 2010-11-04 NOTE — Patient Instructions (Signed)
1. Continue all medications including allegra for allergy. 2. Eat a regular diet 3. Keep appointment with Dr. Arbutus Ped April 5th - he can recheck your hemoglobin. Today it was 10.2 g - which is good.

## 2010-11-04 NOTE — Progress Notes (Signed)
  Subjective:    Patient ID: [name], female    DOB: 1920/06/09, 77 y.o.   MRN: [id number]  HPI Patient was recently hospitalized for transfusion due to Hgb 7.6 g which when admitted was 6.2 g. She was transfused 3 units packed RBCs. She tolerated this well. Since being home she has felt weak, fatigued but no specific focal complaints. She has had some fullness in the head and also she is having a little dysequilibrium. She has had a good appetite. Bowels have moved. NO respiratory difficulty. She does have allergies and this is a tough time of year.   Past Medical History  Diagnosis Date  . Inguinal hernia   . Mitral valve insufficiency and aortic valve insufficiency   . Other and unspecified hyperlipidemia   . Pneumonia   . Osteoarthrosis, unspecified whether generalized or localized, unspecified site   . Nephrolithiasis   . Unspecified essential hypertension   . Type II or unspecified type diabetes mellitus without mention of complication, not stated as uncontrolled   . Coronary atherosclerosis of unspecified type of vessel, native or graft   . Congestive heart failure, unspecified   . Atrial fibrillation   . Unspecified asthma   . Anemia, unspecified   . Allergic rhinitis, cause unspecified   . Colon polyps 10/2009   FmHx and SocHx - reveiwed for relevance.    Review of Systems  Constitutional: Negative.  Negative for fever, activity change, appetite change and fatigue.  HENT: Negative for hearing loss, congestion and neck pain.   Eyes: Positive for discharge.       [facility] discharge. No itching Respiratory: Negative.  Negative for cough, choking, chest tightness and wheezing.   Cardiovascular: Negative.  Negative for chest pain and palpitations.  Gastrointestinal: Negative.   Genitourinary: Negative.   Neurological: Positive for dizziness and light-headedness. Negative for syncope and numbness.  Hematological: Negative.        Objective:   Physical Exam    Constitutional: She is oriented to person, place, and time.       Frail appearing elderly white woman in no acute distress  HENT:  Head: Normocephalic and atraumatic.  Eyes: Conjunctivae and EOM are normal. No scleral icterus.  Neck: No thyromegaly present.  Cardiovascular: Normal rate and regular rhythm.   Pulmonary/Chest: Effort normal and breath sounds normal.  Abdominal: Soft.  Lymphadenopathy:    She has no cervical adenopathy.  Neurological: She is alert and oriented to person, place, and time.  Skin: Skin is warm and dry.  Psychiatric: She has a normal mood and affect.          Assessment & Plan:  1. Anemia - patient with chronic anemia most likely due to AVMs who recently was transfused 3 units blood with Hgb rising from 6.2g to greater than 9g. She continues to feel a bit weak and light-headed. Her exam is unremarkable. Her lab today with a Hgb 10.2g. No acute problem identified.  Plan - continue present regimen           Keep early April appointment with Dr. Arbutus Ped and have repeat H/H at that time.           Call for problems

## 2010-11-06 ENCOUNTER — Telehealth: Payer: Self-pay | Admitting: *Deleted

## 2010-11-06 NOTE — Telephone Encounter (Signed)
Pt stopped by office and left her number stating to front that she had a question about labs. Per MD, pt's son was at [facility] and labs were discussed. Attempted to call sharon but only received vm - Left mess to call office back.

## 2010-11-06 NOTE — Miscellaneous (Signed)
Summary: Orders Update   Clinical Lists Changes  Orders: Added new Referral order of Gastroenterology Referral (GI) - Signed 

## 2010-11-07 NOTE — Telephone Encounter (Signed)
Pt has ov Monday

## 2010-11-10 ENCOUNTER — Ambulatory Visit: Payer: Medicare Other | Admitting: Internal Medicine

## 2010-11-10 NOTE — Discharge Summary (Signed)
[name], [name]              ACCOUNT NO.:  [id number]  MEDICAL RECORD NO.:  [id number]           PATIENT TYPE:  I  LOCATION:  5511                         FACILITY:  [facility]  PHYSICIAN:  [name] Gess. 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  CONSULTATIONS:  None.  PROCEDURES:  None.  HISTORY OF PRESENT ILLNESS:  [name] an 77 year old woman has known chronic anemia with iron deficiency.  She has had a thorough evaluation by Dr. Claudette [name].  Colonoscopy was negative.  Upper GI series was negative.  The patient has declined both EGD and capsule endoscopy.  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  She has been followed also by Dr. Arbutus [name].  She recently had IV iron infusion.  The patient was feeling weaker than usual and came into the lab and had a hemoglobin of 7.6 grams as an outpatient.  Given her advanced age and comorbidities including CAD, she was admitted on 24-hour eval for transfusion.  Please see the EMR generated admit note for past medical history, family history, social history, and exam.  HOSPITAL COURSE:  The patient was admitted to a nontelemetry unit.  She had routine labs drawn which revealed her hemoglobin had dropped to 6.2 grams.  Chemistries were unremarkable with a creatinine of 1.3 and glucose was elevated at 398.  The patient was typed and crossmatched for 3 units of packed red cells which were administered over the course of the night.  She tolerated this well with no reports of complications or adverse reactions.  With the patient having received 3 units transfusion with goal hemoglobin of 9+ grams, she is ready for  discharge to home.  DISCHARGE EXAMINATION:  VITAL SIGNS:  Temperature was 97.8, blood pressure was 146/55, heart rate 68, respirations 18, and last CBG was 429 at 2200 hours. GENERAL APPEARANCE:  An elderly woman in no acute distress. CARDIOVASCULAR:  The patient had a quiet precordium.  She had an irregularly irregular rhythm that was rate-controlled.  She had a 2/6 soft systolic murmur. CHEST:  Clear with no rales, wheezes, or rhonchi. ABDOMEN:  Soft.  No guarding or rebound was noted.  DISPOSITION:  The patient is ready for discharge to home.  She will be seen in the office for followup laboratory in 10-14 days and will be notified by telephone of an appropriate appointment time.  The patient's serum glucose was elevated.  She reports she used to take her medications at home.  Last hemoglobin A1c from September 05, 2010 was 6.3%.  We will obtain another A1c when she returns for a followup H and H.  The patient will continue all of her home medications and see the medication discharge manager.  The patient will follow up with Dr. Arbutus [name] as instructed.  The patient will follow up for lab as noted above.  The patient's condition  at time of discharge dictation is medically stable with a guarded prognosis given her advanced age, multiple comorbidities, and known ongoing AVM blood loss.     [name] Gess [name] Kubly, MD     MEN/MEDQ  D:  10/31/2010  T:  10/31/2010  Job:  [id number]  cc:   [name] Lick. Russella Dar, MD, [facility] [name], M.D.  Electronically Signed by [name] Regulus MD on 11/10/2010 09:03:12 AM

## 2010-11-11 ENCOUNTER — Other Ambulatory Visit: Payer: Medicare Other

## 2010-11-13 ENCOUNTER — Other Ambulatory Visit: Payer: Self-pay | Admitting: Internal Medicine

## 2010-11-13 ENCOUNTER — Encounter (HOSPITAL_BASED_OUTPATIENT_CLINIC_OR_DEPARTMENT_OTHER): Payer: Medicare Other | Admitting: Internal Medicine

## 2010-11-13 DIAGNOSIS — D509 Iron deficiency anemia, unspecified: Secondary | ICD-10-CM

## 2010-11-13 DIAGNOSIS — D638 Anemia in other chronic diseases classified elsewhere: Secondary | ICD-10-CM

## 2010-11-13 DIAGNOSIS — D649 Anemia, unspecified: Secondary | ICD-10-CM

## 2010-11-13 LAB — GLUCOSE, CAPILLARY
Glucose-Capillary: 106 mg/dL — ABNORMAL HIGH (ref 70–99)
Glucose-Capillary: 111 mg/dL — ABNORMAL HIGH (ref 70–99)
Glucose-Capillary: 113 mg/dL — ABNORMAL HIGH (ref 70–99)
Glucose-Capillary: 115 mg/dL — ABNORMAL HIGH (ref 70–99)
Glucose-Capillary: 120 mg/dL — ABNORMAL HIGH (ref 70–99)
Glucose-Capillary: 128 mg/dL — ABNORMAL HIGH (ref 70–99)
Glucose-Capillary: 130 mg/dL — ABNORMAL HIGH (ref 70–99)
Glucose-Capillary: 156 mg/dL — ABNORMAL HIGH (ref 70–99)

## 2010-11-13 LAB — CBC
HCT: 29.1 % — ABNORMAL LOW (ref 36.0–46.0)
Hemoglobin: 9.6 g/dL — ABNORMAL LOW (ref 12.0–15.0)
MCV: 89.2 fL (ref 78.0–100.0)
Platelets: 337 10*3/uL (ref 150–400)
RDW: 17.2 % — ABNORMAL HIGH (ref 11.5–15.5)
WBC: 4.7 10*3/uL (ref 4.0–10.5)

## 2010-11-13 LAB — BASIC METABOLIC PANEL
BUN: 30 mg/dL — ABNORMAL HIGH (ref 6–23)
Chloride: 99 mEq/L (ref 96–112)
Glucose, Bld: 122 mg/dL — ABNORMAL HIGH (ref 70–99)
Potassium: 3.5 mEq/L (ref 3.5–5.1)

## 2010-11-13 LAB — IRON AND TIBC
%SAT: 8 % — ABNORMAL LOW (ref 20–55)
Iron: 36 ug/dL — ABNORMAL LOW (ref 42–145)
TIBC: 444 ug/dL (ref 250–470)
UIBC: 408 ug/dL

## 2010-11-13 LAB — CBC WITH DIFFERENTIAL/PLATELET
BASO%: 0 % (ref 0.0–2.0)
HCT: 30 % — ABNORMAL LOW (ref 34.8–46.6)
MCHC: 32.9 g/dL (ref 31.5–36.0)
MONO#: 0.5 10*3/uL (ref 0.1–0.9)
RBC: 3.31 10*6/uL — ABNORMAL LOW (ref 3.70–5.45)
WBC: 6.4 10*3/uL (ref 3.9–10.3)
lymph#: 0.5 10*3/uL — ABNORMAL LOW (ref 0.9–3.3)

## 2010-11-14 LAB — GLUCOSE, CAPILLARY
Comment 1: 120068789
Glucose-Capillary: 105 mg/dL — ABNORMAL HIGH (ref 70–99)
Glucose-Capillary: 106 mg/dL — ABNORMAL HIGH (ref 70–99)
Glucose-Capillary: 108 mg/dL — ABNORMAL HIGH (ref 70–99)
Glucose-Capillary: 110 mg/dL — ABNORMAL HIGH (ref 70–99)
Glucose-Capillary: 110 mg/dL — ABNORMAL HIGH (ref 70–99)
Glucose-Capillary: 113 mg/dL — ABNORMAL HIGH (ref 70–99)
Glucose-Capillary: 113 mg/dL — ABNORMAL HIGH (ref 70–99)
Glucose-Capillary: 113 mg/dL — ABNORMAL HIGH (ref 70–99)
Glucose-Capillary: 114 mg/dL — ABNORMAL HIGH (ref 70–99)
Glucose-Capillary: 116 mg/dL — ABNORMAL HIGH (ref 70–99)
Glucose-Capillary: 123 mg/dL — ABNORMAL HIGH (ref 70–99)
Glucose-Capillary: 127 mg/dL — ABNORMAL HIGH (ref 70–99)
Glucose-Capillary: 128 mg/dL — ABNORMAL HIGH (ref 70–99)
Glucose-Capillary: 129 mg/dL — ABNORMAL HIGH (ref 70–99)
Glucose-Capillary: 129 mg/dL — ABNORMAL HIGH (ref 70–99)
Glucose-Capillary: 132 mg/dL — ABNORMAL HIGH (ref 70–99)
Glucose-Capillary: 135 mg/dL — ABNORMAL HIGH (ref 70–99)
Glucose-Capillary: 136 mg/dL — ABNORMAL HIGH (ref 70–99)
Glucose-Capillary: 136 mg/dL — ABNORMAL HIGH (ref 70–99)
Glucose-Capillary: 137 mg/dL — ABNORMAL HIGH (ref 70–99)
Glucose-Capillary: 137 mg/dL — ABNORMAL HIGH (ref 70–99)
Glucose-Capillary: 138 mg/dL — ABNORMAL HIGH (ref 70–99)
Glucose-Capillary: 139 mg/dL — ABNORMAL HIGH (ref 70–99)
Glucose-Capillary: 141 mg/dL — ABNORMAL HIGH (ref 70–99)
Glucose-Capillary: 143 mg/dL — ABNORMAL HIGH (ref 70–99)
Glucose-Capillary: 145 mg/dL — ABNORMAL HIGH (ref 70–99)
Glucose-Capillary: 151 mg/dL — ABNORMAL HIGH (ref 70–99)
Glucose-Capillary: 152 mg/dL — ABNORMAL HIGH (ref 70–99)
Glucose-Capillary: 155 mg/dL — ABNORMAL HIGH (ref 70–99)
Glucose-Capillary: 158 mg/dL — ABNORMAL HIGH (ref 70–99)
Glucose-Capillary: 160 mg/dL — ABNORMAL HIGH (ref 70–99)
Glucose-Capillary: 161 mg/dL — ABNORMAL HIGH (ref 70–99)
Glucose-Capillary: 164 mg/dL — ABNORMAL HIGH (ref 70–99)
Glucose-Capillary: 167 mg/dL — ABNORMAL HIGH (ref 70–99)
Glucose-Capillary: 169 mg/dL — ABNORMAL HIGH (ref 70–99)
Glucose-Capillary: 170 mg/dL — ABNORMAL HIGH (ref 70–99)
Glucose-Capillary: 170 mg/dL — ABNORMAL HIGH (ref 70–99)
Glucose-Capillary: 171 mg/dL — ABNORMAL HIGH (ref 70–99)
Glucose-Capillary: 175 mg/dL — ABNORMAL HIGH (ref 70–99)
Glucose-Capillary: 185 mg/dL — ABNORMAL HIGH (ref 70–99)
Glucose-Capillary: 190 mg/dL — ABNORMAL HIGH (ref 70–99)
Glucose-Capillary: 191 mg/dL — ABNORMAL HIGH (ref 70–99)
Glucose-Capillary: 193 mg/dL — ABNORMAL HIGH (ref 70–99)
Glucose-Capillary: 196 mg/dL — ABNORMAL HIGH (ref 70–99)
Glucose-Capillary: 196 mg/dL — ABNORMAL HIGH (ref 70–99)
Glucose-Capillary: 213 mg/dL — ABNORMAL HIGH (ref 70–99)
Glucose-Capillary: 231 mg/dL — ABNORMAL HIGH (ref 70–99)
Glucose-Capillary: 232 mg/dL — ABNORMAL HIGH (ref 70–99)
Glucose-Capillary: 234 mg/dL — ABNORMAL HIGH (ref 70–99)
Glucose-Capillary: 236 mg/dL — ABNORMAL HIGH (ref 70–99)
Glucose-Capillary: 242 mg/dL — ABNORMAL HIGH (ref 70–99)
Glucose-Capillary: 91 mg/dL (ref 70–99)
Glucose-Capillary: 92 mg/dL (ref 70–99)
Glucose-Capillary: 93 mg/dL (ref 70–99)
Glucose-Capillary: 94 mg/dL (ref 70–99)
Glucose-Capillary: 97 mg/dL (ref 70–99)
Glucose-Capillary: 98 mg/dL (ref 70–99)
Glucose-Capillary: 98 mg/dL (ref 70–99)
Glucose-Capillary: 98 mg/dL (ref 70–99)
Glucose-Capillary: 155 mg/dL — ABNORMAL HIGH (ref 70–99)
Glucose-Capillary: 158 mg/dL — ABNORMAL HIGH (ref 70–99)

## 2010-11-14 LAB — CROSSMATCH
ABO/RH(D): A POS
ABO/RH(D): A POS
Antibody Screen: NEGATIVE
Antibody Screen: NEGATIVE

## 2010-11-14 LAB — DIFFERENTIAL
Basophils Absolute: 0 10*3/uL (ref 0.0–0.1)
Basophils Absolute: 0 K/uL (ref 0.0–0.1)
Basophils Absolute: 0 K/uL (ref 0.0–0.1)
Basophils Relative: 0 % (ref 0–1)
Basophils Relative: 0 % (ref 0–1)
Basophils Relative: 0 % (ref 0–1)
Eosinophils Absolute: 0.1 K/uL (ref 0.0–0.7)
Eosinophils Absolute: 0 K/uL (ref 0.0–0.7)
Eosinophils Relative: 2 % (ref 0–5)
Eosinophils Relative: 0 % (ref 0–5)
Lymphocytes Relative: 22 % (ref 12–46)
Lymphocytes Relative: 3 % — ABNORMAL LOW (ref 12–46)
Lymphs Abs: 0.9 K/uL (ref 0.7–4.0)
Lymphs Abs: 0.2 K/uL — ABNORMAL LOW (ref 0.7–4.0)
Monocytes Absolute: 0.5 K/uL (ref 0.1–1.0)
Monocytes Absolute: 0.2 K/uL (ref 0.1–1.0)
Monocytes Relative: 12 % (ref 3–12)
Monocytes Relative: 2 % — ABNORMAL LOW (ref 3–12)
Neutro Abs: 2.6 K/uL (ref 1.7–7.7)
Neutro Abs: 5.4 10*3/uL (ref 1.7–7.7)
Neutro Abs: 6.5 K/uL (ref 1.7–7.7)
Neutrophils Relative %: 64 % (ref 43–77)
Neutrophils Relative %: 84 % — ABNORMAL HIGH (ref 43–77)
Neutrophils Relative %: 94 % — ABNORMAL HIGH (ref 43–77)

## 2010-11-14 LAB — CBC
HCT: 22.7 % — ABNORMAL LOW (ref 36.0–46.0)
HCT: 25.2 % — ABNORMAL LOW (ref 36.0–46.0)
HCT: 27.4 % — ABNORMAL LOW (ref 36.0–46.0)
HCT: 29 % — ABNORMAL LOW (ref 36.0–46.0)
HCT: 30.9 % — ABNORMAL LOW (ref 36.0–46.0)
HCT: 31.1 % — ABNORMAL LOW (ref 36.0–46.0)
HCT: 31.7 % — ABNORMAL LOW (ref 36.0–46.0)
Hemoglobin: 10.1 g/dL — ABNORMAL LOW (ref 12.0–15.0)
Hemoglobin: 10.2 g/dL — ABNORMAL LOW (ref 12.0–15.0)
Hemoglobin: 7.5 g/dL — CL (ref 12.0–15.0)
Hemoglobin: 8.6 g/dL — ABNORMAL LOW (ref 12.0–15.0)
Hemoglobin: 8.7 g/dL — ABNORMAL LOW (ref 12.0–15.0)
Hemoglobin: 9.4 g/dL — ABNORMAL LOW (ref 12.0–15.0)
Hemoglobin: 9.5 g/dL — ABNORMAL LOW (ref 12.0–15.0)
Hemoglobin: 9.6 g/dL — ABNORMAL LOW (ref 12.0–15.0)
Hemoglobin: 10.4 g/dL — ABNORMAL LOW (ref 12.0–15.0)
MCHC: 32.6 g/dL (ref 30.0–36.0)
MCHC: 32.9 g/dL (ref 30.0–36.0)
MCHC: 33 g/dL (ref 30.0–36.0)
MCHC: 33.1 g/dL (ref 30.0–36.0)
MCHC: 33.5 g/dL (ref 30.0–36.0)
MCHC: 34.1 g/dL (ref 30.0–36.0)
MCHC: 34.2 g/dL (ref 30.0–36.0)
MCHC: 32.8 g/dL (ref 30.0–36.0)
MCHC: 32.9 g/dL (ref 30.0–36.0)
MCV: 88.2 fL (ref 78.0–100.0)
MCV: 88.5 fL (ref 78.0–100.0)
MCV: 88.5 fL (ref 78.0–100.0)
MCV: 88.9 fL (ref 78.0–100.0)
MCV: 89.2 fL (ref 78.0–100.0)
MCV: 89 fL (ref 78.0–100.0)
MCV: 89 fL (ref 78.0–100.0)
Platelets: 113 10*3/uL — ABNORMAL LOW (ref 150–400)
Platelets: 117 10*3/uL — ABNORMAL LOW (ref 150–400)
Platelets: 120 K/uL — ABNORMAL LOW (ref 150–400)
Platelets: 121 10*3/uL — ABNORMAL LOW (ref 150–400)
Platelets: 124 K/uL — ABNORMAL LOW (ref 150–400)
Platelets: 131 10*3/uL — ABNORMAL LOW (ref 150–400)
Platelets: 152 K/uL (ref 150–400)
Platelets: 240 K/uL (ref 150–400)
Platelets: 140 10*3/uL — ABNORMAL LOW (ref 150–400)
RBC: 2.56 MIL/uL — ABNORMAL LOW (ref 3.87–5.11)
RBC: 2.86 MIL/uL — ABNORMAL LOW (ref 3.87–5.11)
RBC: 3.1 MIL/uL — ABNORMAL LOW (ref 3.87–5.11)
RBC: 3.19 MIL/uL — ABNORMAL LOW (ref 3.87–5.11)
RBC: 3.46 MIL/uL — ABNORMAL LOW (ref 3.87–5.11)
RBC: 3.56 MIL/uL — ABNORMAL LOW (ref 3.87–5.11)
RDW: 15.9 % — ABNORMAL HIGH (ref 11.5–15.5)
RDW: 16.3 % — ABNORMAL HIGH (ref 11.5–15.5)
RDW: 16.4 % — ABNORMAL HIGH (ref 11.5–15.5)
RDW: 16.6 % — ABNORMAL HIGH (ref 11.5–15.5)
RDW: 16.7 % — ABNORMAL HIGH (ref 11.5–15.5)
RDW: 16.8 % — ABNORMAL HIGH (ref 11.5–15.5)
RDW: 16.9 % — ABNORMAL HIGH (ref 11.5–15.5)
RDW: 16.9 % — ABNORMAL HIGH (ref 11.5–15.5)
RDW: 17.4 % — ABNORMAL HIGH (ref 11.5–15.5)
RDW: 16.3 % — ABNORMAL HIGH (ref 11.5–15.5)
WBC: 4.1 K/uL (ref 4.0–10.5)
WBC: 4.9 K/uL (ref 4.0–10.5)
WBC: 5.8 10*3/uL (ref 4.0–10.5)
WBC: 6.4 K/uL (ref 4.0–10.5)
WBC: 7.4 K/uL (ref 4.0–10.5)
WBC: 9.4 10*3/uL (ref 4.0–10.5)

## 2010-11-14 LAB — HEPATIC FUNCTION PANEL
AST: 19 U/L (ref 0–37)
Albumin: 3.1 g/dL — ABNORMAL LOW (ref 3.5–5.2)
Bilirubin, Direct: 0.6 mg/dL — ABNORMAL HIGH (ref 0.0–0.3)
Total Protein: 6.4 g/dL (ref 6.0–8.3)

## 2010-11-14 LAB — COMPREHENSIVE METABOLIC PANEL WITH GFR
ALT: 14 U/L (ref 0–35)
ALT: 25 U/L (ref 0–35)
AST: 10 U/L (ref 0–37)
AST: 24 U/L (ref 0–37)
Albumin: 2.4 g/dL — ABNORMAL LOW (ref 3.5–5.2)
Albumin: 4 g/dL (ref 3.5–5.2)
Alkaline Phosphatase: 102 U/L (ref 39–117)
Alkaline Phosphatase: 129 U/L — ABNORMAL HIGH (ref 39–117)
BUN: 11 mg/dL (ref 6–23)
BUN: 19 mg/dL (ref 6–23)
CO2: 24 meq/L (ref 19–32)
CO2: 33 meq/L — ABNORMAL HIGH (ref 19–32)
Calcium: 9 mg/dL (ref 8.4–10.5)
Calcium: 9.9 mg/dL (ref 8.4–10.5)
Chloride: 102 meq/L (ref 96–112)
Chloride: 96 meq/L (ref 96–112)
Creatinine, Ser: 0.72 mg/dL (ref 0.4–1.2)
Creatinine, Ser: 0.93 mg/dL (ref 0.4–1.2)
GFR calc non Af Amer: >60 mL/min
GFR calc non Af Amer: 57 mL/min — ABNORMAL LOW
Glucose, Bld: 102 mg/dL — ABNORMAL HIGH (ref 70–99)
Glucose, Bld: 154 mg/dL — ABNORMAL HIGH (ref 70–99)
Potassium: 3.7 meq/L (ref 3.5–5.1)
Potassium: 2.8 meq/L — ABNORMAL LOW (ref 3.5–5.1)
Sodium: 140 meq/L (ref 135–145)
Sodium: 142 meq/L (ref 135–145)
Total Bilirubin: 3.7 mg/dL — ABNORMAL HIGH (ref 0.3–1.2)
Total Bilirubin: 1.2 mg/dL (ref 0.3–1.2)
Total Protein: 6 g/dL (ref 6.0–8.3)
Total Protein: 7.6 g/dL (ref 6.0–8.3)

## 2010-11-14 LAB — HEMOGLOBIN A1C
Hgb A1c MFr Bld: 5.7 % (ref 4.6–6.1)
Mean Plasma Glucose: 117 mg/dL

## 2010-11-14 LAB — BASIC METABOLIC PANEL
BUN: 13 mg/dL (ref 6–23)
BUN: 18 mg/dL (ref 6–23)
BUN: 9 mg/dL (ref 6–23)
CO2: 30 mEq/L (ref 19–32)
CO2: 34 mEq/L — ABNORMAL HIGH (ref 19–32)
CO2: 35 mEq/L — ABNORMAL HIGH (ref 19–32)
CO2: 36 mEq/L — ABNORMAL HIGH (ref 19–32)
Calcium: 9.6 mg/dL (ref 8.4–10.5)
Calcium: 9.9 mg/dL (ref 8.4–10.5)
Chloride: 103 mEq/L (ref 96–112)
Chloride: 97 mEq/L (ref 96–112)
Creatinine, Ser: 0.57 mg/dL (ref 0.4–1.2)
Creatinine, Ser: 0.81 mg/dL (ref 0.4–1.2)
Creatinine, Ser: 0.94 mg/dL (ref 0.4–1.2)
GFR calc Af Amer: >60 mL/min (ref >60)
GFR calc Af Amer: >60 mL/min (ref >60)
GFR calc Af Amer: >60 mL/min (ref >60)
GFR calc non Af Amer: >60 mL/min (ref >60)
GFR calc non Af Amer: >60 mL/min (ref >60)
GFR calc non Af Amer: 54 mL/min — ABNORMAL LOW (ref >60)
Glucose, Bld: 137 mg/dL — ABNORMAL HIGH (ref 70–99)
Glucose, Bld: 152 mg/dL — ABNORMAL HIGH (ref 70–99)
Glucose, Bld: 90 mg/dL (ref 70–99)
Potassium: 3.2 mEq/L — ABNORMAL LOW (ref 3.5–5.1)
Potassium: 4.2 mEq/L (ref 3.5–5.1)
Potassium: 2.6 mEq/L — CL (ref 3.5–5.1)
Sodium: 137 mEq/L (ref 135–145)
Sodium: 143 mEq/L (ref 135–145)
Sodium: 144 mEq/L (ref 135–145)
Sodium: 146 mEq/L — ABNORMAL HIGH (ref 135–145)

## 2010-11-14 LAB — LIPID PANEL
HDL: 40 mg/dL (ref >39)
Total CHOL/HDL Ratio: 2.2 RATIO
Triglycerides: 66 mg/dL (ref <150)
VLDL: 13 mg/dL (ref 0–40)

## 2010-11-14 LAB — BASIC METABOLIC PANEL WITH GFR
BUN: 11 mg/dL (ref 6–23)
BUN: 11 mg/dL (ref 6–23)
BUN: 13 mg/dL (ref 6–23)
BUN: 9 mg/dL (ref 6–23)
CO2: 24 meq/L (ref 19–32)
CO2: 27 meq/L (ref 19–32)
CO2: 29 meq/L (ref 19–32)
CO2: 30 meq/L (ref 19–32)
Calcium: 8.2 mg/dL — ABNORMAL LOW (ref 8.4–10.5)
Calcium: 8.5 mg/dL (ref 8.4–10.5)
Calcium: 8.9 mg/dL (ref 8.4–10.5)
Calcium: 8.9 mg/dL (ref 8.4–10.5)
Chloride: 100 meq/L (ref 96–112)
Chloride: 101 meq/L (ref 96–112)
Chloride: 102 meq/L (ref 96–112)
Chloride: 103 meq/L (ref 96–112)
Creatinine, Ser: 0.46 mg/dL (ref 0.4–1.2)
Creatinine, Ser: 0.55 mg/dL (ref 0.4–1.2)
Creatinine, Ser: 0.64 mg/dL (ref 0.4–1.2)
Creatinine, Ser: 0.65 mg/dL (ref 0.4–1.2)
GFR calc non Af Amer: >60 mL/min
GFR calc non Af Amer: >60 mL/min
GFR calc non Af Amer: >60 mL/min
GFR calc non Af Amer: >60 mL/min
Glucose, Bld: 109 mg/dL — ABNORMAL HIGH (ref 70–99)
Glucose, Bld: 116 mg/dL — ABNORMAL HIGH (ref 70–99)
Glucose, Bld: 162 mg/dL — ABNORMAL HIGH (ref 70–99)
Glucose, Bld: 174 mg/dL — ABNORMAL HIGH (ref 70–99)
Potassium: 3.4 meq/L — ABNORMAL LOW (ref 3.5–5.1)
Potassium: 3.6 meq/L (ref 3.5–5.1)
Potassium: 3.7 meq/L (ref 3.5–5.1)
Potassium: 4.2 meq/L (ref 3.5–5.1)
Sodium: 136 meq/L (ref 135–145)
Sodium: 137 meq/L (ref 135–145)
Sodium: 138 meq/L (ref 135–145)
Sodium: 139 meq/L (ref 135–145)

## 2010-11-14 LAB — COMPREHENSIVE METABOLIC PANEL
ALT: 16 U/L (ref 0–35)
Albumin: 2.3 g/dL — ABNORMAL LOW (ref 3.5–5.2)
Alkaline Phosphatase: 91 U/L (ref 39–117)
Alkaline Phosphatase: 99 U/L (ref 39–117)
BUN: 11 mg/dL (ref 6–23)
CO2: 34 mEq/L — ABNORMAL HIGH (ref 19–32)
Calcium: 8.8 mg/dL (ref 8.4–10.5)
Chloride: 101 mEq/L (ref 96–112)
Creatinine, Ser: 0.5 mg/dL (ref 0.4–1.2)
Creatinine, Ser: 0.57 mg/dL (ref 0.4–1.2)
GFR calc non Af Amer: >60 mL/min (ref >60)
GFR calc non Af Amer: >60 mL/min (ref >60)
Glucose, Bld: 113 mg/dL — ABNORMAL HIGH (ref 70–99)
Glucose, Bld: 209 mg/dL — ABNORMAL HIGH (ref 70–99)
Glucose, Bld: 236 mg/dL — ABNORMAL HIGH (ref 70–99)
Potassium: 2.9 mEq/L — ABNORMAL LOW (ref 3.5–5.1)
Potassium: 3.7 mEq/L (ref 3.5–5.1)
Sodium: 137 mEq/L (ref 135–145)
Sodium: 143 mEq/L (ref 135–145)
Total Bilirubin: 2.4 mg/dL — ABNORMAL HIGH (ref 0.3–1.2)
Total Protein: 5.8 g/dL — ABNORMAL LOW (ref 6.0–8.3)
Total Protein: 5.9 g/dL — ABNORMAL LOW (ref 6.0–8.3)

## 2010-11-14 LAB — PHOSPHORUS
Phosphorus: 2.2 mg/dL — ABNORMAL LOW (ref 2.3–4.6)
Phosphorus: 2.4 mg/dL (ref 2.3–4.6)
Phosphorus: 2.4 mg/dL (ref 2.3–4.6)

## 2010-11-14 LAB — CARDIAC PANEL(CRET KIN+CKTOT+MB+TROPI)
CK, MB: 1.6 ng/mL (ref 0.3–4.0)
Relative Index: INVALID (ref 0.0–2.5)
Total CK: 81 U/L (ref 7–177)

## 2010-11-14 LAB — MAGNESIUM
Magnesium: 1.8 mg/dL (ref 1.5–2.5)
Magnesium: 1.9 mg/dL (ref 1.5–2.5)
Magnesium: 1.9 mg/dL (ref 1.5–2.5)

## 2010-11-14 LAB — CK TOTAL AND CKMB (NOT AT ARMC)
CK, MB: 2.9 ng/mL (ref 0.3–4.0)
Relative Index: INVALID (ref 0.0–2.5)
Total CK: 98 U/L (ref 7–177)

## 2010-11-14 LAB — TSH: TSH: 0.996 u[IU]/mL (ref 0.350–4.500)

## 2010-11-14 LAB — TRIGLYCERIDES: Triglycerides: 72 mg/dL

## 2010-11-14 LAB — TROPONIN I: Troponin I: 0.01 ng/mL (ref 0.00–0.06)

## 2010-11-14 LAB — PREPARE RBC (CROSSMATCH)

## 2010-11-14 LAB — CHOLESTEROL, TOTAL

## 2010-11-14 LAB — PREALBUMIN: Prealbumin: 4.1 mg/dL — ABNORMAL LOW (ref 18.0–45.0)

## 2010-11-18 ENCOUNTER — Other Ambulatory Visit: Payer: Self-pay | Admitting: Internal Medicine

## 2010-11-19 ENCOUNTER — Encounter (HOSPITAL_BASED_OUTPATIENT_CLINIC_OR_DEPARTMENT_OTHER): Payer: Medicare Other | Admitting: Internal Medicine

## 2010-11-19 DIAGNOSIS — D638 Anemia in other chronic diseases classified elsewhere: Secondary | ICD-10-CM

## 2010-11-19 DIAGNOSIS — D509 Iron deficiency anemia, unspecified: Secondary | ICD-10-CM

## 2010-11-26 ENCOUNTER — Other Ambulatory Visit: Payer: Self-pay | Admitting: Orthopedic Surgery

## 2010-11-26 ENCOUNTER — Encounter (HOSPITAL_BASED_OUTPATIENT_CLINIC_OR_DEPARTMENT_OTHER): Payer: Medicare Other | Admitting: Internal Medicine

## 2010-11-26 DIAGNOSIS — M25552 Pain in left hip: Secondary | ICD-10-CM

## 2010-11-26 DIAGNOSIS — D509 Iron deficiency anemia, unspecified: Secondary | ICD-10-CM

## 2010-11-27 ENCOUNTER — Other Ambulatory Visit: Payer: Medicare Other

## 2010-12-03 ENCOUNTER — Ambulatory Visit
Admission: RE | Admit: 2010-12-03 | Discharge: 2010-12-03 | Disposition: A | Discharge status: Home / Self Care | Payer: Medicare Other | Source: Ambulatory Visit | Attending: Orthopedic Surgery | Admitting: Orthopedic Surgery

## 2010-12-03 DIAGNOSIS — M25552 Pain in left hip: Secondary | ICD-10-CM

## 2010-12-08 ENCOUNTER — Telehealth: Payer: Self-pay | Admitting: *Deleted

## 2010-12-08 DIAGNOSIS — D649 Anemia, unspecified: Secondary | ICD-10-CM

## 2010-12-08 NOTE — Telephone Encounter (Signed)
Daughter is req labs to check Hgb. Please advise.

## 2010-12-09 ENCOUNTER — Other Ambulatory Visit (INDEPENDENT_AMBULATORY_CARE_PROVIDER_SITE_OTHER): Payer: Medicare Other

## 2010-12-09 DIAGNOSIS — D649 Anemia, unspecified: Secondary | ICD-10-CM

## 2010-12-09 LAB — CBC WITH DIFFERENTIAL/PLATELET
Basophils Relative: 0.2 % (ref 0.0–3.0)
HCT: 28.5 % — ABNORMAL LOW (ref 36.0–46.0)
Hemoglobin: 9.3 g/dL — ABNORMAL LOW (ref 12.0–15.0)
Lymphocytes Relative: 6.7 % — ABNORMAL LOW (ref 12.0–46.0)
Monocytes Relative: 7.1 % (ref 3.0–12.0)
Neutro Abs: 7.8 10*3/uL — ABNORMAL HIGH (ref 1.4–7.7)
RBC: 2.96 Mil/uL — ABNORMAL LOW (ref 3.87–5.11)

## 2010-12-09 NOTE — Telephone Encounter (Signed)
Patient notified

## 2010-12-09 NOTE — Telephone Encounter (Signed)
May cojme in for CBC order entered

## 2010-12-10 ENCOUNTER — Telehealth: Payer: Self-pay | Admitting: *Deleted

## 2010-12-10 DIAGNOSIS — D649 Anemia, unspecified: Secondary | ICD-10-CM

## 2010-12-10 NOTE — Telephone Encounter (Signed)
Pt's daughter req call back re: lab results from 12-09-10. She states she feels bad

## 2010-12-11 NOTE — Telephone Encounter (Signed)
Sorry for the delay. Hgb is 9.3 g which is good. Repeat lab in 3 weeks, order entered.

## 2010-12-11 NOTE — Telephone Encounter (Signed)
Left detailed vm on Sharon's #

## 2010-12-11 NOTE — Telephone Encounter (Signed)
Daughter is req a call back w/lab results. She says pt has increase in her weakness.

## 2010-12-12 ENCOUNTER — Other Ambulatory Visit: Payer: Self-pay | Admitting: *Deleted

## 2010-12-12 MED ORDER — POTASSIUM CHLORIDE CRYS ER 20 MEQ PO TBCR: 20.0000 meq | EXTENDED_RELEASE_TABLET | Freq: Two times a day (BID) | ORAL | Status: DC

## 2010-12-18 ENCOUNTER — Other Ambulatory Visit: Payer: Self-pay | Admitting: Internal Medicine

## 2010-12-23 NOTE — Assessment & Plan Note (Signed)
[facility]                            CARDIOLOGY OFFICE NOTE   NAME:DILLONAlysha, [name]                     MRN:          [id number]  DATE:03/29/2008                            DOB:          11-09-1919    [name] returns today for followup.  She is a complicated patient who  had been seeing Dr. Charolette Child for years.   She had a prolonged hospitalization in April 2009 and essentially was  turned down for surgery x2 by Dr. Cornelius Moras and the patient is a primary  patient of Dr. Debby Bud.  She has chronic atrial fibrillation and been on  Coumadin, was admitted to the hospital with a significant GI bleed.   She subsequently had her Coumadin resumed and has not had any recurrent  problems.  She has severe 3-vessel disease.  It is not amenable to  percutaneous intervention.  She has a flail leaflet of the posterior  mitral valve with severe MR.  Both her mitral valve disease and coronary  artery disease are really only treatable through surgery and given her  advanced age and somewhat debilitated state, she is not a candidate.   I have read through all of Dr. Adelene Idler 30+ pagers and notes, reviewed  her angiogram and her echocardiogram, and I would tend to agree with  this.  More importantly, currently, [name] is asymptomatic.  She is not  having palpitations or shortness of breath.  She is not having any  significant chest pain.  She does get lower extremity edema and takes  extra Lasix as needed.  She eats a little bit and have some salt in her  food.  She is also on hydrochlorothiazide as part of her Avalide.  Since  the patient is relatively asymptomatic and 77 years old, I would tend to  agree with continued medical therapy.  I also am still hesitant about  her being on Coumadin given her advanced age and GI bleed, but this  decision is being made by Dr. Debby Bud who knows her better.   REVIEW OF SYSTEMS:  Otherwise negative.   MEDICATIONS:  1. She  is on Flexeril 10 a day.  2. Metformin 500 a day.  3. Klor-Con 40 a day.  4. Lunesta 2 mg a day.  5. Metoprolol 100 b.i.d.  6. Fexofenadine 180 a day.  7. Doxazosin 8 mg at bedtime.  8. Digoxin 0.125 a day.  9. Lasix 40 mg a day.  10.Iron.  11.Warfarin as directed.  12.Amlodipine 10 a day.  13.Avalide 300/25.  14.Simvastatin 20 a day.   PHYSICAL EXAMINATION:  GENERAL:  Remarkable for a well-preserved 77-year-  old female, in no distress.  VITAL SIGNS:  Weight is 134, blood pressure is 140/60, pulse 76 and  regular, respiratory rate 14, and afebrile.  HEENT:  Unremarkable.  NECK:  Carotid upstrokes are without bruit.  No lymphadenopathy,  thyromegaly, or JVP elevation.  LUNGS:  Clear with good diaphragmatic motion.  No wheezing.  S1 and S2  with a loud MR murmur.  PMI normal.  ABDOMEN:  Benign.  Bowel sounds positive.  No  AAA.  No tenderness, no  bruit.  No hepatosplenomegaly or hepatojugular reflux.  No tenderness.  EXTREMITIES:  Distal pulses are intact with +1 edema bilaterally.  NEURO:  Nonfocal.  SKIN:  Warm and dry.  No muscular weakness.   EKG shows atrial fibrillation.   IMPRESSION:  1. Atrial fibrillation.  Continue rate control with the digoxin and      beta-blocker.  Continue anticoagulation per Dr. Debby Bud.  2. Coronary artery disease, 3-vessel.  Continue baby aspirin with beta-      blocker.  Currently, not having angina.  3. Severe mitral insufficiency with the prolapse or flail segment of      the posterior leaflet, not an operative candidate.  Continue      current dosages of medication.  Filling pressures appear stable and      she is not short of breath.  4. Lower extremity edema.  Continue Avalide and Lasix 1 to 2 times a      day.  Elevate legs at the end of the day.  Cut out more salt from      the diet.  5. Diabetes, currently appears well controlled.  Followup hemoglobin      A1c with Dr. Debby Bud.  Continue metformin.  6. Recent gastrointestinal  bleed.  Continue iron replacement.      Followup Gastroenterology.  We will have to look back through the      rest of her records to see exactly what was found in regards to a      colonoscopy or EGD.     Noralyn Pick. Eden Emms, MD, [facility]  Electronically Signed    PCN/MedQ  DD: 03/29/2008  DT: 03/30/2008  Job #: [id number]

## 2010-12-23 NOTE — Assessment & Plan Note (Signed)
[facility]                            CARDIOLOGY OFFICE NOTE   NAME:[name], [name]                     MRN:          [id number]  DATE:02/28/2008                            DOB:          1919/09/30    [name] is an 77 year old patient referred by Dr. Debby Bud.  Unfortunately, the patient has a very long history of heart problems and  has been followed by Dr. Aleen Campi.  She was just discharged from the  hospital after a prolonged stay at the end of March to the middle part  of April.   In talking to the patient, she has been on chronic Coumadin for atrial  fibrillation.  She has not had any recent attempts at cardioversion.  She was admitted to the hospital with a GI bleed.  Her hemoglobin got as  low as 5.5.  Needless to say, her Coumadin was stopped.  She received 5  units of blood.  She had upper and lower endoscopy and colonoscopy by  Dr. Virginia Rochester.  She had a few small polyps, but no obvious acute source of  bleeding.   When she was admitted to the hospital, there was evidence of a  congestive heart failure, and she may have bumped her enzymes slightly.  Dr. Aleen Campi cathed her.  She has normal LV function.  She was described  as having a 60% proximal LAD, 80% ostial circ, and 70% RCA disease with  severe mitral insufficiency.   The patient's comorbidities include her age.  There is a history in her  E-chart records regarding PVD and pulmonary fibrosis.   I do not see that she had pre-CABG Dopplers.   The patient was seen by Dr. Cornelius Moras, who initially declined to take her to  surgery.   He was concerned about her initial bleed.  He was apparently reconsulted  after her colonoscopy and endoscopy were fairly benign, and again wanted  an initial attempt at medical therapy.  I would tend to agree with this.  In talking to the patient, she looks remarkably younger than her stated  age; however, she is 77 years old.  Her hospitalization was  undoubtedly  prompted by GI bleed and anemia, not necessarily her heart.  I suspect  that after the 5 units of blood, she became volume overloaded.   I will have to review her heart cath to see just how acute the coronary  disease is and/or if it is amenable to percutaneous intervention. She is  currently not having chest pain.  Her AFib is chronic and asymptomatic.   The biggest issue I have is her recent GI bleed without a documented  source, and the patient is currently back on Coumadin.   We will have to see how this goes.  I suspect Dr. Debby Bud is in the best  position to make a long-term recommendation regarding whether or not her  Coumadin should be stopped.  Given her history of AFib, her age,  hypertension, and congestive heart failure, her [place] score certainly  would imply that she is benefiting from Coumadin so long as she does not  have another GI bleed.   The patient's son was with her today.  Both the son and the patient were  in agreement that they are not in a rush to pursue to open heart surgery  at her age.   In regards to her discharge, she has not had any recurrent nausea or  vomiting.  There has been no evidence of recurrent GI bleed.  Her stools  are darker because of iron therapy.  I do not know that she ever got  referred to Hematology for further workup of myelodysplastic syndrome.   From a cardiac standpoint, she is not having PND or orthopnea.  She has  mild chronic lower extremity edema that she takes diuretics for.   She is due to see the [facility]in Clinic next week, I believe.   Her review of systems is otherwise negative.   Her past medical history is remarkable for previous right hip surgery,  history of benign breast lumps, history of chronic back problems, recent  hospitalization for anemia and GI bleed, history of AFib, and history of  mitral insufficiency.  This dates back as far as 2007, when it was  moderate.   The patient is retired  Interior and spatial designer.  She has been widowed since the 63s.  She has 2 children, and her son stays with her two nights a week.  She  does not smoke or drink.   She has had previous right hip surgery and back surgery.   ALLERGIES:  She is allergic to CODEINE.   CURRENT MEDICATIONS:  1. Flexeril 10 mg a day.  2. Metformin 500 a day.  3. Klor-Con 20 two tablets a day.  4. Lunesta.  5. Metoprolol 100 b.i.d.  6. Fexofenadine 100 a day.  7. Doxazosin 80 a day.  8. Lanoxin 0.125 a day.  9. Lasix 40.  10.Iron tablets.  11.Warfarin as directed.  12.Amlodipine 10 a day.  13.Avalide 300/25.  14.Simvastatin 20 a day.   Her review of systems is otherwise remarkable for arthritis, some  diabetes, previous kidney stones, and allergies.   FAMILY HISTORY:  Remarkable for mother dying at age 19 in childbirth  from a hemorrhage.  Father dying at age 3 of pneumonia.   PHYSICAL EXAMINATION:  GENERAL:  Remarkable for an elderly white female  who looks younger than her stated age.  VITAL SIGNS:  She is in AFib, rate of 88, afebrile, respiratory rate 14,  and blood pressure 150/70.  HEENT:  Unremarkable.  Carotids are normal without bruits.  No  lymphadenopathy, thyromegaly, or JVP elevation.  LUNGS:  Clear.  Good diaphragmatic motion.  No wheezing.  S1 and S2 with  an MR murmur.  PMI mildly increased, but not displaced.  ABDOMEN:  Benign.  Bowel sounds positive.  No AAA, no tenderness, no  bruit, no hepatosplenomegaly, no hepatojugular reflux.  EXTREMITIES:  Distal pulses are intact with +1-2 edema bilaterally.  Status post right hip surgery.  NEURO:  Nonfocal.  SKIN:  Warm and dry.  MUSCULOSKELETAL:  No muscular weakness.   EKG shows AFib with nonspecific ST-T wave changes.   IMPRESSION:  1. Atrial fibrillation.  Good rate control.  For the time being,      continue Coumadin.  Would have a low threshold to stop it,      particularly if her hemoglobin drops again.  2. Recent gastrointestinal  bleed with a hemoglobin of 5.  Continue      iron therapy.  Apparently, her colonoscopy and endoscopy  results      were somewhat underwhelming.  I do not know if Dr. Debby Bud plans on      sending her to Hem/Onc for a bone marrow to rule out      myelodysplastic disorder.  3. Severe mitral insufficiency.  Currently, not an ideal operative      candidate.  Continue to monitor.  I will have to review her echo to      see the mechanism of the mitral regurgitation.  4. Coronary artery disease.  Currently, not having angina.  On good      beta-blocker therapy.  The ostial circumflex probably is not      amenable to stenting.   I will have to review her angiograms, but again particularly since Dr.  Cornelius Moras refused or turned her down for surgery twice, I think ongoing  medical therapy would be in order unless she were to have unstable  angina.  1. Previous right hip surgery with increasing left hip pain.      Apparently, the patient has had injections before arranged by Dr.      Sherlean Foot.  She would need to have her Coumadin stopped and bridged      with Lovenox.  Apparently, this has been done before.  I do not      know that she has any contraindications for recurrent injections in      her hip if this is necessary.  However, she will let me know if she      is going to do this, so we can figure out exactly how to monitor      her Coumadin.  2. Diabetes mellitus.  I do not have a good feel for how bad her      diabetes is.  Certainly, she does not have kidney failure.  She was      discharged on only 500 of metformin.  She does not appear to be a      typical type 2 insulin-resistant diabetic.  I suspect  Dr. Debby Bud      will check hemoglobin A1c in 3 months.  3. Lower extremity edema.  Appears to be at baseline.  Continue Lasix      and Klor-Con.  4. Hypertension.  Currently, well controlled.  Continue current      medications, especially Avalide 300/25.  Low-salt diet.  5. Hyperlipidemia in the  setting of coronary artery disease.  Continue      simvastatin 20 a day.  Lipid and liver profile in 6 months.  6. Again, it could be quite a bit of time to review the patient's      previous hospitalizations, interview her and the son, and get a      feel for how aggressive they want to be.  I still have to review      her echo and heart cath.  I have told her for the time being, we      will continue current medical therapy, see how hemoglobin does      while back on Coumadin, and I will see her back in about 4 weeks to      reassess the situation.     Noralyn Pick. Eden Emms, MD, [facility]  Electronically Signed    PCN/MedQ  DD: 02/28/2008  DT: 02/29/2008  Job #: [id number]   cc:   Rosalyn Gess. Norins, MD

## 2010-12-23 NOTE — Cardiovascular Report (Signed)
NAMECHERYE, [name]              ACCOUNT NO.:  [id number]   MEDICAL RECORD NO.:  [id number]          PATIENT TYPE:  INP   LOCATION:  2913                         FACILITY:  [facility]   PHYSICIAN:  Antionette Char, MD    DATE OF BIRTH:  [birth year]   DATE OF PROCEDURE:  11/10/2007  DATE OF DISCHARGE:                            CARDIAC CATHETERIZATION   PROCEDURES:  1. Right heart catheterization.  2. Left heart catheterization.  3. Left ventricular angiography.  4. Coronary cine angiography.  5. Left internal mammary artery angiography.  6. Angioseal to the right femoral artery.   INDICATIONS FOR PROCEDURE:  This 77 year old female was admitted with a  severe GI bleed with a hemoglobin of 5 and received 5 units of blood to  bring her hemoglobin above 5.  Gastroenterology consult showed clear  findings on an EGD and cleared her for cath today.  A CT scan initially  looking for retroperitoneal bleed because of the massive amount of  bleeding causing a severe drop in hemoglobin and this was negative.  However, it did show pleural fluid and some ascites.  It showed an  increase in heart size also.  She does have a past history of mitral  valve disease with moderate mitral insufficiency noted in 2007.  She  also has chronic atrial fibrillation and had been on Coumadin.  Her  cardiac enzymes were mildly elevated on admission.  With her findings of  congestive heart failure and positive enzymes, she was scheduled for  cardiac cath today to assess her mitral valve disease and her coronary  artery status.   PROCEDURE:  After signing an informed consent, the patient was  premedicated with 5 mg of Valium by mouth and brought to the cardiac  catheterization lab at [facility].  Her right groin is prepped  and draped in a sterile fashion and anesthetized locally with 1%  lidocaine.  A 7-French introducer sheath was inserted percutaneously  into the right femoral vein.  A 7-French  Swan-Ganz catheter was inserted  through the right femoral vein sheath and advanced to the right atrium,  right ventricle, pulmonary artery in wedge positions.  Pressures were  recorded and cardiac output was measured using thermodilution technique.  A number 6-French introducer sheath was inserted percutaneously into the  right femoral artery.  A 6-French pigtail catheter was inserted through  the right femoral artery sheath and advanced to the ridge of the aorta  and left ventricle.  Pressures were recorded in the left ventricle with  simultaneous tracings in the left ventricle and pulmonary wedge  positions.  Simultaneous oximetry samples were obtained from the left  ventricle and pulmonary artery.  A pullback across the aortic valve was  recorded.  A 6-French number 4 Judkins coronary catheters were used to  make injections into the native coronary arteries.  A right coronary  artery catheter was used to make an injection into the left subclavian  artery visualizing the left internal mammary artery.  The patient  tolerated the procedure well and no complications were noted.  At the  end of the procedure,  the catheter and sheath were removed from the  right femoral artery and hemostasis was easily obtained in the artery by  using an Angioseal closure device and in the vein by using standard  pressure.   MEDICATIONS GIVEN:  None.   HEMODYNAMIC DATA:  Will be on the separate data sheet.  There was no  gradient across the aortic valve.   CINE FINDINGS:  Left ventricular cine angiogram:  Left ventricular  chamber size is mild-moderately enlarged.  The overall left ventricular  contractility is normal with an ejection fraction of approximately 60%.  There were no abnormally moving segments.  There was severe mitral  insufficiency with 3-4+ regurgitation noted and outlining a very  enlarged left atrium.  There was mild dense calcification in the lower  portion of the mitral valve.  The  aortic valve is a three cusp valve and  appears normal.   Coronary cine angiography:  Left coronary artery, the ostium appears  normal.  There is mild tapering of the left main which is a short  structure with a plaque in the distal segment causing a 20-30%  narrowing.  Left anterior descending, there is a 60% focal stenotic  lesion in the proximal left anterior descending just distal to the first  anterolateral branch.  The remainder of the LAD appeared normal.  Circumflex coronary artery, there is an ostial lesion of 80% with a  second plaque in the middle segment causing a 20-30% narrowing.  Right  coronary artery, the right coronary artery is a large dominant vessel  displaying the posterior descending and posterolateral circulation.  There is segmental plaque in the proximal segment with an area of 70%  stenosis in the middle portion of this plaque.  The distal right  coronary artery at the acute angle and crux appears normal.  The  posterior descending and posterolateral branches appear normal.  Left  internal mammary artery appears normal.   FINAL DIAGNOSES:  1. Severe mitral insufficiency 3-4+ with mild calcification of the      mitral valve.  2. Three vessel coronary artery disease with 60% proximal left      anterior descending lesion, 80% ostial circumflex lesion and 70%      proximal right coronary artery lesion.  3. Severely dilated left atrium and right atrium.  4. Normal left ventricular function with 60% ejection fraction.  5. Cardiomegaly mild-moderate.  6. Successful Angioseal of the right femoral artery.   DISPOSITION:  We will ask CVTS to see to consider possible valve surgery  and bypass grafting.      Antionette Char, MD  Electronically Signed     JRT/MEDQ  D:  11/10/2007  T:  11/10/2007  Job:  [id number]

## 2010-12-23 NOTE — Assessment & Plan Note (Signed)
[facility]                            CARDIOLOGY OFFICE NOTE   NAME:[name], [name] CANTY                     MRN:          [id number]  DATE:09/06/2008                            DOB:          [birth year]    [name] apparently was seen in the Coumadin Clinic yesterday.  She had  been having increasing shortness of breath and edema.  She was evaluated  by Dr. Myrtis [name] who told her to take 120 of Lasix and was to see me today.  Unfortunately, [name] has not been taking her Lasix appropriately.  Dr.  Debby [name] had previously told her to double up on her Lasix as her weight  had gone up by 2 pounds, so she had neglected to do this.  Looking back,  she is an 77 year old patient who has chronic atrial fibrillation and  severe MR.  She was turned down for surgery due to her advanced age by  Dr. Cornelius [name].  She had had a recent GI bleed, but apparently, has been  restarted on Coumadin.  She understands that her mitral valve was not  able to be corrected and she also has severe 3-vessel disease, but is  not amenable to stenting.   The patient has had some lower extremity edema since her right hip  surgery, but it is worsening.  It probably does represent a volume  overload.   She had improvement with her diuretics outside of getting cramps in her  legs.   I explained to her that this may be due to shifts in calcium, magnesium,  and potassium.  Currently, she feels improved.  She has chronic  shortness of breath, which is not worse.  There is no PND or orthopnea.  There is currently no pulmonary edema or evidence of central congestion.  She is not having any significant chest pain.   REVIEW OF SYSTEMS:  Otherwise remarkable for no significant melena or GI  bleeding, otherwise negative.   CURRENT MEDICATIONS:  1. Flexeril 10 mg a day.  2. Metformin 500 a day.  3. Klor-Con 20 a day.  4. Metoprolol 100 b.i.d.  5. Fexofenadine 100 a day.  6. Doxazosin that dose not  documented.  7. Lanoxin 0.25 a day.  8. Iron.  9. Lasix 40 a day.  10.Warfarin as directed.  11.Amlodipine 10 a day.  12.Avalide 300/25.  13.Simvastatin 20 a day.   ALLERGIES:  She is allergic to CODEINE.   PHYSICAL EXAMINATION:  GENERAL:  Remarkable for an elderly female, in no  distress.  VITAL SIGNS:  She is in chronic AFib at a rate of 80.  Blood pressure is  130/60, respiratory rate 14, afebrile.  HEENT:  Unremarkable.  NECK:  She has fairly significant JP elevation with a V-wave.  There is  a referred murmur of the carotids.  No lymphadenopathy.  LUNGS:  Clear,  good diaphragmatic motion.  No wheezing.  S1 and S2 with an MR murmur.  There is also a systolic ejection murmur.  PMI is mildly increased.  ABDOMEN:  Benign.  Bowel sounds positive.  No hepatosplenomegaly.  Hepatojugular reflux is positive.  No AAA, no tenderness, no bruit.  No  hepatosplenomegaly.  EXTREMITIES:  Distal pulses are intact, +2 edema bilaterally.  NEUROLOGIC:  Nonfocal.  SKIN:  Warm and dry.  No muscular weakness.   EKG shows atrial fibrillation with no acute changes.   IMPRESSION:  1. Lower extremity edema.  Increase Lasix to 40 b.i.d.  Add      spironolactone 25 a day.  Keep potassium at 20 a day.  Baseline      BMET and BNP to be done today and followed up in 4 weeks.  Continue      low-sodium diet.  2. Three-vessel coronary artery disease, currently stable.  We will      have to look back through our records, but her current rate control      would appear to be adequate with good beta-blockade.  3. Atrial fibrillation, good rate control.  Apparently, decision was      made to put her back on Coumadin despite a gastrointestinal bleed      in her advanced age.  Follow up INR in the Coumadin Clinic.      Currently, not having any melena.  We will check her hemoglobin and      creatinine today since she is getting lab work for her diuretics.  4. Hypertension, currently well controlled.  Continue  current dose of      Avalide and amlodipine.  5. Severe mitral insufficiency, not an operative candidate, but likely      resulting in pulmonary hypertension and lower extremity edema.      Continue current dose of diuretics.   I talked to [name] and her son today regarding advance directives.  They really has not had this addressed before.  I encouraged them to  follow up with Dr. Debby [name] for this, particularly given her multitude of  medical problems and inoperability in her advanced age suspect that she  will be in the hospital within the next year with pulmonary edema.   I would like to avoid any prolonged intubation in the patient if we can.     [name]. [name] Emms, MD, [facility]  Electronically Signed    PCN/MedQ  DD: 09/06/2008  DT: 09/07/2008  Job #: [id number]

## 2010-12-23 NOTE — Discharge Summary (Signed)
[name], [name]              ACCOUNT NO.:  [id number]   MEDICAL RECORD NO.:  [id number]          PATIENT TYPE:  INP   LOCATION:  2034                         FACILITY:  [facility]   PHYSICIAN:  Antionette Char, MD    DATE OF BIRTH:  November 03, 1919   DATE OF ADMISSION:  11/07/2007  DATE OF DISCHARGE:  11/24/2007                               DISCHARGE SUMMARY   FINAL DIAGNOSES:  1. Acute coronary syndrome with 3-vessel coronary artery disease.  2. Mitral valve disease with severe mitral regurgitation.  3. Congestive heart failure, improved.  4. Acute gastrointestinal bleed, unknown site with marked anemia, now      stable.   REASON FOR ADMISSION:  This 77 year old female was admitted on November 07, 2007, after the onset of marked weakness, fatigue, and shortness of  breath.  In the emergency room, she was found to have severe anemia with  a hemoglobin of 5.5 and hematocrit of 25.4.  She has a history of  chronic atrial fibrillation and had been on Coumadin for several years.  Her pro time was noted to be elevated.  Also, her cardiac enzymes were  abnormal in the emergency room.   HOSPITAL COURSE:  After receiving 5 units of packed red blood cells, the  patient was admitted to the hospital for further care.  Her lungs showed  marked changes of congestive heart failure with rales and dullness in  the bases, and a CT scan done from the emergency room showed pleural  fluid and ascites.  There was no evidence for retroperitoneal hematoma.  A GI consultation was obtained by Dr. Sabino Gasser who performed an EGD on  November 09, 2007, which showed clear upper GI and no evidence for bleeding  site.  With her cardiac enzymes being abnormal and the presence of  severe congestive failure, he felt that a colonoscopy at that time was  at too high risk.  We then performed a cardiac cath on November 10, 2007, to  define her cardiac status and at the cath we found 3-vessel coronary  artery disease and severe  mitral insufficiency with severely dilated  left atrium and right atrium.  Her left ventricular ejection fraction  and function continued to be normal with a 60% ejection fraction and she  had mild-to-moderate cardiomegaly.  With these findings, we obtained a  CT surgical consultation by Dr. Cornelius Moras and he felt that her anemia and GI  status was too unstable to pursue surgery at this time.  We then  obtained a hematology consultation who felt that we should proceed with  the colonoscopy procedure, and then if no further clear etiology could  be found, he would consider her for bone marrow biopsy to rule out a  myelodysplastic syndrome.  We then recalled Dr. Virginia Rochester who performed a  colonoscopy on November 15, 2007, and aside from several small polyps of the  ascending colon and sigmoid colon, he felt that she was stable  gastrointestinal wise.  He felt that these polyps were at the small risk  of bleeding.  He then recalled CT surgery and again  they felt that the  risks, benefits of surgery at this time were too great and wanted her to  continue further medical management and rehabilitation and follow up in  several weeks after discharge for further consideration of surgery.  We  then restarted her on Coumadin and we will plan to keep her pro time in  a low therapeutic range.  We have continued to treat her congestive  heart failure with diuretics and beta-blocker and she has made gradual  improvement with the aid of physical therapy.  She has gained strength  and is now tolerating walking in the hall.  Social service has now  arranged for home health care with visiting RN and visiting aide and  physical therapy, and we will plan to discharge her today home in  improved and stable condition.  Another incidental finding during the  admission was an elevated fasting blood sugar, and she has been covered  with insulin on a sensitive scale.  Yesterday, we also started metformin  500 mg daily, and we will  plan to continue this at home and follow up  her diabetic condition early next week.  We will also check her pro time  early next week.   DISPOSITION ON DISCHARGE:   DIET:  Heart-healthy diet.   ACTIVITY:  Increase activity slowly as tolerated.   MEDICATIONS:  1. Metformin 500 mg daily.  2. Metoprolol 50 mg b.i.d.  3. Lisinopril 2.5 mg daily.  4. Isosorbide mononitrate 30 mg daily.  5. Simvastatin 20 mg nightly.  6. Ambien 5 mg nightly.  7. Coumadin 5 mg daily.  8. Furosemide 40 mg daily.  9. K-Dur 10 mEq daily.  10.Avalide 300/25 daily.   Followup appointment with an office visit on November 29, 2007.   CONDITION ON DISCHARGE:  Improved and stable.      Antionette Char, MD  Electronically Signed     JRT/MEDQ  D:  11/24/2007  T:  11/25/2007  Job:  [id number]

## 2010-12-23 NOTE — Op Note (Signed)
[name], [name]              ACCOUNT NO.:  [id number]   MEDICAL RECORD NO.:  [id number]          PATIENT TYPE:  INP   LOCATION:  2550                         FACILITY:  [facility]   PHYSICIAN:  Mila Homer. Sherlean Foot, M.D. DATE OF BIRTH:  19-Dec-1919   DATE OF PROCEDURE:  01/17/2007  DATE OF DISCHARGE:                               OPERATIVE REPORT   SURGEON:  Mila Homer. Sherlean Foot, M.D.   ASSISTANT:  Richardean Canal, P.A.   ANESTHESIA:  General.   PREOPERATIVE DIAGNOSIS:  Right hip osteoarthritis.   POSTOPERATIVE DIAGNOSIS:  Right hip osteoarthritis.   PROCEDURE:  Right total hip arthroplasty.   INDICATIONS FOR PROCEDURE:  The patient is an 86-year white female with  conservative measures for osteoarthritis of the right hip.  Informed  consent was obtained.   DESCRIPTION OF PROCEDURE:  The patient was laid supine and administered  general anesthesia, was then placed in the right-up, left-down lateral  decubitus position.  After a sterile prep and drape of the right hip, a  curvilinear incision was made centered over the trochanter with #10  blade.  A fresh blade was used to go down to fascia lata and incise the  fascia lata along the length of the incision.  Cautery was used to  obtain hemostasis.  I then made an incision dividing the gluteus medius  into an anterior and posterior half and the vastus lateralis as well.  I  elevated the entire sleeve sharply and subperiosteally with a #10 blade  and tagged it with 4 stay sutures.  This encompassed the anterior half  of the gluteus medius, all of the minimus, and the anterior 50% of the  lateralis.  I then placed a Hohmann retractor protecting the abductor  lateralis sleeve.  I then removed the anterior hip capsule.  I then used  the cutting guide, marked out the neck cut, made the cut with the  reciprocating saw, and removed the head and neck segment easily.  At  this point I placed a Hohmann retractor anteriorly and posteriorly.  I  then switched sides with the PA.  I then reamed from 44 up to 48 and put  in a fiber mesh cup with 3 holes.  I then placed a superior and superior  posterior screw 15 and 12 mm in length, respectively, and then placed a  standard 2+ poly liner accepting a 32-mm head.  I then went to the back  side of the table.  I then flexed the femur and put it into a sterile  pouch on the anterior aspect of the table.  We then gained access to the  femoral canal with the canal finder.  I then reamed up to 12 mm and then  broached the 12 and trialled with -3.5 and had excellent stability.  I  then removed the trial components, copiously irrigated.  Then cemented  in the fully porous-coated stem and tamped on to a very clean Morse  taper a 32 by -3.5 mm ball.  I then located the hip, took it through an  aggressive range of motion, and it excellent stability.  I then  irrigated, closed the lateralis, medius sleeve through drill holes in  the trochanter.  I then oversewed that with figure-of-eight #2 Tevdek  sutures.  I then closed the fascia lata with running #1 Vicryl sutures.  I then closed through buried 0 Vicryl sutures and then running  subcuticular 2-0 Vicryl stitch and skin staples, dressing sponges and  Mersilene tape sponge.   COMPLICATIONS:  None.   DRAINS:  None.   ESTIMATED BLOOD LOSS:  300 mL.           ______________________________  Mila Homer. Sherlean Foot, M.D.     SDL/MEDQ  D:  01/17/2007  T:  01/17/2007  Job:  [id number]

## 2010-12-23 NOTE — Assessment & Plan Note (Signed)
[facility]                            CARDIOLOGY OFFICE NOTE   NAME:[name], [name] BIFFLE                     MRN:          [id number]  DATE:09/28/2008                            DOB:          [birth year]    [name] returns today for followup.  She has inoperable three-vessel  disease with severe MR.  When I last saw her, she had increasing  shortness of breath and edema.  We added Aldactone to her medications.  She took this for about a week and is now 100% improved.  Her weight has  gone from 140-128.   Her son on his own stopped the Aldactone and put her back on her normal  b.i.d. regimen.  I told him this was probably okay so long as she  watches her weight and symptoms.  They can always add it back and give  me a call.  In regards to her AFib, rate control has been fine.  She is  not having any palpitations.   She has had a history of a GI bleed.  She was off Coumadin for a while.  This was restarted by at one of her other doctors.  I am still fearful  about [name] being on Coumadin given her age, light weight, frailty,  and previous history of GI bleeding.  I think she is extremely high risk  for recurrent bleed.  I myself would not have placed her back on it.  She was also placed back on aspirin.  I think a baby aspirin is  reasonable given her severe three-vessel disease.   Her last INR check was on September 06, 2008, and she needs to be checked  today and get on a regular schedule.   I specifically asked her regarding her stools and she has not had any  melena or although they tend to be dark from her iron intake.  When I  last saw her, I was concerned about continued anemia, and her hematocrit  was 27.  She is on iron replacement, but Dr. Debby Bud did not feel that  she needed a transfusion and her to be on Aranesp   Otherwise, her lab work looked okay and her BMP was 438.   Review of systems is otherwise negative.  She has been able to  sleep  through the night without PND or orthopnea.  Lower extremity edema is  gone.  She continues to have mild exertional dyspnea.  No palpitations,  chest pain, or syncope.  No melena, nausea, vomiting, or evidence of  bleeding.   She is allergic to CODEINE.   She is on Flexeril 10 a day, metformin 500 a day, Klor-Con 40 a day,  Lopressor 100 b.i.d., sleep med, doxazosin 8 mg a day, Lanoxin 0.125 a  day, amlodipine 10 a day, Avalide 30/25, simvastatin 20 a day, Lasix 40  b.i.d.   PHYSICAL EXAMINATION:  GENERAL:  Remarkable for frail, thin female in no  distress.  Affect is appropriate.  VITAL SIGNS:  Weight is down to 128, blood pressure 132/58, pulse is 70  and irregular, respiratory rate 14 and  afebrile.  HEENT:  Unremarkable.  NECK:  Carotids normal without bruit.  No lymphadenopathy, thyromegaly,  or JVP elevation.  LUNGS:  Clear.  Good diaphragmatic motion.  No wheezing.  HEART:  S1 and S2 with systolic ejection murmur and a MR murmur.  PMI is  normal.  ABDOMEN:  Benign.  Bowel sounds positive.  No AAA.  No tenderness.  No  bruit.  No hepatosplenomegaly, hepatojugular reflux, or tenderness.  EXTREMITIES:  Distal pulses are intact.  No edema.  NEUROLOGIC:  Nonfocal.  SKIN:  Warm and dry.  MUSCULOSKELETAL:  No muscular weakness.   IMPRESSION:  1. Coronary artery disease, severe three-vessel, not operable.      Continue baby aspirin and beta-blocker.  2. Atrial fibrillation.  Decision made by primary care to place the      patient back on Coumadin.  Follow up in the Coumadin Clinic today.      Shoot for low therapeutic INR in the 2 arrange.  Rate control is      fine with digoxin and calcium blockers.  3. Severe mitral regurgitation, not an operative candidate.      Congestive failure improved with a week of Aldactone.  Continue      current dose of Lasix and hydrochlorothiazide.  Continue to follow      weight.  Resume Aldactone for any weight gain over 4 pounds.  4.  Anemia with a history of gastrointestinal bleed.  The patient is      currently not on Protonix.  We will leave it up to Dr. Debby Bud to      see if he wants to add an H2 blocker given her history of      gastrointestinal bleed and need for baby aspirin and Coumadin.  I      would place her on something.  She will continue him on her stools.      She should probably have a followup hematocrit in 3 months.  5. Hypercholesterolemia in the setting of coronary disease.  Continue      simvastatin.  Lipid and liver profile in 6 months.  6. Overall, I am glad [name]' heart failure is improved, and I will      see her back in 6 months.     [name]. Eden Emms, MD, [facility]  Electronically Signed    PCN/MedQ  DD: 09/28/2008  DT: 09/29/2008  Job #: [id number]

## 2010-12-23 NOTE — Op Note (Signed)
[name], [name] NO.:  [id number]   MEDICAL RECORD NO.:  [id number]          PATIENT TYPE:  INP   LOCATION:  2913                         FACILITY:  [facility]   PHYSICIAN:  Georgiana Spinner, M.D.    DATE OF BIRTH:  Jun 22, 1920   DATE OF PROCEDURE:  DATE OF DISCHARGE:                               OPERATIVE REPORT   PROCEDURE:  Upper endoscopy.   INDICATIONS:  GI blood loss presumed.   ANESTHESIA:  Fentanyl 50 mcg, Versed 5 mg.   PROCEDURE:  With the patient mildly sedated in the left lateral  decubitus position, the Pentax videoscopic endoscope was inserted in the  mouth, passed under direct vision through the esophagus, which appeared  normal, into the stomach.  Fundus, body, antrum, all appeared normal as  did duodenal bulb, second portion of the duodenum.  There was no  evidence of bleeding.  There was bile seen throughout.  From this point  the endoscope was slowly withdrawn taking circumferential views of  duodenal mucosa until the endoscope been pulled back into stomach,  placed in retroflexion to view the stomach from below.  The endoscope  was straightened and withdrawn taking circumferential views of the  remaining gastric and esophageal mucosa.  The patient's vital signs and  pulse oximeter remained stable.  The patient tolerated the procedure  well without apparent complications.   FINDINGS:  Negative exam.  At this point would switch from Protonix  infusion to once-a-day Protonix but will liberalize diet.  There is no  evidence of active upper GI bleeding at this point so I think  anticoagulation can be resumed, and apparently there was no  retroperitoneal hemorrhage noted either.  At this point I have no  further evaluation anticipated and will be available as needed.  Please  call for further follow-up.  Given the current state of her cardiac  process, workup such as colonoscopy I think would probably not be  valuable to her unless her cardiac  status would be markedly improved.           ______________________________  Georgiana Spinner, M.D.     GMO/MEDQ  D:  11/09/2007  T:  11/09/2007  Job:  [id number]

## 2010-12-23 NOTE — Consult Note (Signed)
[name], [name]              ACCOUNT NO.:  [id number]   MEDICAL RECORD NO.:  [id number]          PATIENT TYPE:  INP   LOCATION:  2034                         FACILITY:  [facility]   PHYSICIAN:  [name] Matte, MD  DATE OF BIRTH:  19-Nov-1919   DATE OF CONSULTATION:  11/11/2007  DATE OF DISCHARGE:                                 CONSULTATION   REASON FOR CONSULTATION:  Anemia.   REFERRING PHYSICIAN:  Antionette Char, MD   HISTORY OF PRESENT ILLNESS:  [name] is a very pleasant 77 year old  white female with multiple medical problems, including peripheral  vascular disease, diabetes and permanent atrial fibrillation.  She has  been on anticoagulation with Coumadin for years.  The patient was  admitted recently with generalized weakness and was found to have  multivessel coronary artery disease.  She was supposed to have CABG, but  her H&H on admission was 5.5 and 25.4 respectively.  The patient  received 5 units of packed RBCs.  She was found to have black tarry  stools.  Dr. Virginia [name] performed an EGD, that was reported to be unremarkable;  but no colonoscopy was performed.  Looking at the patient's previous  blood work for the last few years, her hemoglobin has always been  running between 8.5 and 10 gm/dL.  The patient has had a few episodes of  epistaxis in the past, as well as occasional rectal bleed, but not in  concerning amount according to her.  She is feeling well today, and  denied any dizziness, syncope or fatigue.  Looking at her peripheral  blood smear today, no significant abnormalities were seen, except for  the anemia.  No schistocytes were seen.  She is currently not on  Coumadin.  She is on heparin.  Her white count and platelet count were  normal.   PAST MEDICAL HISTORY:  1. Severe anemia -- recent GI bleed; iron deficiency anemia.  2. Three-vessel CAD.  3. History of asthma.  4. History of pulmonary fibrosis.  5. History of pneumonia in 2006.  6. Lumbosacral  DJD.  7. Diabetes mellitus type 2, diet-controlled.  8. Peripheral vascular disease.  9. History of bilateral tinnitus.  10.Chronic atrial fibrillation for the last 20 years.  11.Known mitral regurgitation, severe.  12.Pleural effusion -- ascites per CT of the abdomen.  13.Normal LV function, with an ejection fraction of 60%.   SURGERIES:  1. Status post right hip replacement in June 2008, status post      bilateral breast lumpectomies in the 1960s.  2. Status post hysterectomy in the 1990s.  3. Status post spinal surgery in the past.   ALLERGIES:  CODEINE, causes nausea and vomiting.   MEDICATIONS:  Novo Log, Lopressor, Protonix, heparin per pharmacy,  morphine sulfate, nitroglycerin, Zofran p.r.n., Percocet p.r.n.  Of  note, the patient has been taking iron at home.   REVIEW OF SYSTEMS:  Remarkable for mild fatigue, slight short-term  memory function.  He also has some GERD symptoms and dyspnea on  exertion.  She had chest pain on admission, this is being better  controlled during  her hospitalization.  She had some nausea and loose  stools, which were dark during this admission, with some frank blood.  No hematuria or dysuria.  She has back pain; this is chronic.  No  numbness or edema.  Mild abdominal discomfort; this has been present for  1 week.  She has a history of nosebleeds, and she admits to taking  significant amount of ice chips.   FAMILY HISTORY:  Noncontributory for anemia.  Her mother died with  pneumonia.  Father died with CHF; and one brother in good health.   SOCIAL HISTORY:  The patient is widowed.  She has one son and one  daughter.  Her son lives nearby.  She has 4 grandchildren.  No alcohol  history.  No tobacco history.  Lives in [place], alone.   HEALTH MAINTENANCE:  Her last upper GI evaluation was during this  hospitalization.  No colonoscopy has been performed, secondary to other  comorbidities.  She is up-to-date with her physicals.  She had a   transfusion back in June 2008, after a hospitalization.   PHYSICAL EXAMINATION:  This is a thin, 77 year old white female in no  acute distress; alert and oriented x3.  HEENT:  Normocephalic, atraumatic.  PERRL.  Oral mucosa without thrush  or lesions.  NECK:  Supple.  No cervical or supraclavicular masses.  LUNGS:  With mild decreased breath sounds at the bases; otherwise no  wheezing, rhonchi or rales.  No axillary masses.  CARDIOVASCULAR:  Irregularly regular rate and rhythm, with a 2/6  systolic murmur; high pitch.  No rubs or gallops.  ABDOMEN:  Soft, tender at the epigastrium.  No hepatosplenomegaly.  Bowel sounds x4.  EXTREMITIES:  With no clubbing or cyanosis.  No edema.  No inguinal masses.  There are arthritic changes in her hands.  SKIN:  Without bruising or petechial rash.  BREASTS:  Not examined.  MUSCULOSKELETAL:  With no spinal tenderness.  NEUROLOGIC:  Nonfocal.   LABS:  Hemoglobin 8.5, hematocrit 25.4, white count 9.2, platelets 180,  MCV 87.6, ANC 6.6, monocytes 0.75, lymphocytes 0.7.  LDH of 143, PTT 42.  PT 26.2, INR 2.3.  Sodium 134, potassium 3.9, BUN 22, creatinine 0.7,  glucose 295, total bilirubin 2.0, alkaline phosphatase 67.  AST 17, ALT  17, total protein 5.8, albumin 2.6, calcium 8.2.   RADIOLOGY:  CT of the abdomen on November 08, 2007  with no contrast and CT  of the pelvis, shows normal liver. adrenals and pancreas.  There is  bilateral pleural effusions, more pronounced on the right; mild right  perinephric fluid, nonspecific.  Positive ascites.   ASSESSMENT/PLAN:  Dr. Shirline [name] has seen and evaluated the patient and  reviewed the chart.  This is a pleasant 77 year old white female with  severe anemia, most likely to be secondary to blood loss; as the patient  has been on chronic anticoagulation, but cannot exclude other causes at  this point.  Will order iron study and ferritin, as well as SPEP, LDH,  folic acid, vitamin B12 level, reticulocyte count,  and fractionated  bilirubin; as well as  haptoglobin.  Consider proceeding with colonoscopy, to rule out  gastrointestinal bleed.  If no clear etiology from the above labs and  procedures, Dr. Shirline [name] may consider the patient for a bone marrow  biopsy and aspiration to rule out myelodysplastic syndrome.      Marlowe Kays, P.A.      [name] Matte, MD  Electronically Signed    SW/MEDQ  D:  11/13/2007  T:  11/13/2007  Job:  [id number]   cc:   Salvatore Decent. Cornelius Moras, M.D.  Georgianne Fick, M.D.

## 2010-12-23 NOTE — Consult Note (Signed)
[name], [name]              ACCOUNT NO.:  [id number]   MEDICAL RECORD NO.:  [id number]          PATIENT TYPE:  INP   LOCATION:  2913                         FACILITY:  [facility]   PHYSICIAN:  Salvatore Decent. Cornelius Moras, M.D. DATE OF BIRTH:  08-Apr-1920   DATE OF CONSULTATION:  11/10/2007  DATE OF DISCHARGE:                                 CONSULTATION   REQUESTING PHYSICIAN:  Dr. Charolette Child.   PRIMARY CARE PHYSICIAN:  Dr. Nicholos Johns.   REASON FOR CONSULTATION:  Three-vessel coronary artery disease and  mitral regurgitation.   HISTORY OF PRESENT ILLNESS:  [name] is an 77 year old widowed white  female from [place] with known history of mitral regurgitation and  long standing atrial fibrillation.  She has been followed for several  years by Dr. Aleen Campi and has been chronically anticoagulated with  Coumadin.  She has no previous history of coronary artery disease.  She  has no previous history of congestive heart failure.  She states that  for several weeks she has developed slow, gradual progression of  generalized weakness, fatigue and vague pain all over.  Last week she  developed significant weakness and further pain in her back and she  thought that she noted dark-colored stools.  She was evaluated as an  outpatient in office and told she may have symptoms related to the flu.  This past weekend she developed worsening symptoms of generalized  weakness and nausea.  This progressed to the point on March 30 where she  developed severe generalized weakness as well as midsternal pain  radiating to her back.  She apparently had some history of blood in her  stool and thought it may be related to hemorrhoids.  She presented to  the emergency department where electrocardiogram revealed atrial  fibrillation with rapid response, some nonspecific EKG changes.  She was  treated with heparin and Plavix.  Her baseline prothrombin time INR was  2.3.  Her baseline hemoglobin was 5.5 with  hematocrit 16%.  Baseline  cardiac enzymes were borderline abnormal with troponin I of 0.29.  She  did develop further mild increase in cardiac enzymes following hospital  admission with a peak troponin I of 2.68, peak total CK of 144, and peak  CK-MB fraction of 12.8.  She underwent transfusion of blood products  with an appropriate rise in her hemoglobin to 9.1 early this morning.  A  2-D echocardiogram was performed.  Official results of this exam are not  yet available.  The patient underwent left and right heart  catheterization today by Dr. Aleen Campi.  She was found to have three-  vessel coronary artery disease with at least moderate mitral  regurgitation and normal left ventricular function.  There was moderate  pulmonary hypertension.  The patient apparently had also underwent upper  GI endoscopy.  Reports are not in the chart, although according to the  patient and her family no source of bleeding was identified.  Cardiothoracic surgical consultation has been requested.   REVIEW OF SYSTEMS:  GENERAL:  The patient reports slow, gradual  progression of generalized weakness over many weeks leading up  to her  current hospitalization.  Her appetite has been stable and she has not  been gaining or losing weight.  CARDIAC:  The patient denies any  previous episodes of chest pain, chest tightness chest pressure either  with activity or at rest to suggest any previous history of angina  pectoris.  The patient did have some burning midsternal chest pain at  the time of admission that could be consistent with unstable angina.  This resolved following correction of her profound anemia and control of  heart rate.  The patient denies exertional shortness of breath.  The  patient denies resting shortness of breath, PND, orthopnea, or syncope.  The patient does have chronic bilateral lower extremity edema.  RESPIRATORY:  Notable for the absence of productive cough, hemoptysis,  wheezing.  The  patient has been treated for asthma and pulmonary  fibrosis in the past.  GASTROINTESTINAL:  Notable for history of dark  stools last week followed by some frank blood shortly prior to  admission.  The patient has had problems with nausea.  The patient has  intermittently had problems with constipation and diarrhea but she  claims her bowel function has been fairly regular recently.  MUSCULOSKELETAL:  Notable for chronic low back pain and pain in her left  hip.  NEUROLOGIC:  Notable for some recent gradual decrease in short-  term memory function.  The patient denies symptoms suggestive of  previous TIA or stroke.  The patient did have transient visual  disturbance last year, although this seemed to be related to ocular  problems.  GENITOURINARY:  Negative.  The patient denies urgency or  frequency.  INFECTIOUS:  Negative.  HEMATOLOGIC:  Notable for history of  anemia in the past.  The patient has had some nosebleeds in the past  while on Coumadin.  She has no previous history of GI bleeding prior to  this hospitalization.  ENDOCRINE:  Notable for diet-controlled type 2  diabetes mellitus.  PSYCHIATRIC:  Negative.  HEENT:  Negative.   PAST MEDICAL HISTORY:  1. Mitral regurgitation.  2. Permanent atrial fibrillation.  3. Lumbosacral degenerative disk disease and arthritis.  4. Degenerative arthritis both hips.  5. Peripheral vascular disease.  6. Type 2 diabetes mellitus.  7. Asthma.  8. Pulmonary fibrosis.   SOCIAL HISTORY:  The patient is a widow who lives in [place].  Her  son and daughter live nearby and are very supportive.  She has been  reasonably active physically and continues to drive an automobile, take  care of her own meals and chores.  She does admit to progressive fatigue  and decreased activity primarily related to worsening problems with her  lower back and hips.  The patient is a nonsmoker and denies alcohol  consumption.   FAMILY HISTORY:  Noncontributory.    MEDICATIONS PRIOR TO ADMISSION:  Coumadin, digoxin, Lasix, doxazosin,  Avalide, metoprolol, Ambien, DynaCirc.   DRUG ALLERGIES:  CODEINE causes nausea.   PHYSICAL EXAMINATION:  The patient is a frail, thin, elderly female who  appears her stated age in no acute distress.  Heart rate is 108 and  rhythm is atrial fibrillation.  Blood pressure recently measured 142/59.  HEENT EXAM:  Grossly unremarkable.  There is no jugular venous  distention.  No carotid bruits noted.  Auscultation of the chest reveals clear breath sounds anteriorly.  Breath sounds are symmetrical.  No wheezes or rhonchi noted.  CARDIOVASCULAR EXAM:  Notable for irregular heart rhythm.  There is a  loud  grade 3-4/6 holosystolic murmur heard at the apex with radiation  across the precordium into the back and the axilla.  ABDOMEN:  Soft, nondistended, nontender.  There may be a slight amount  of ascites.  No palpable masses are present.  There is no tenderness.  Bowel sounds are present.  EXTREMITIES:  Warm and adequately perfused.  Distal pulses are very  thready and barely palpable in the dorsalis pedis and posterior tibial  position.  There are changes of chronic venous insufficiency in both  lower legs.  There is trace to mild lower extremity edema.  SKIN:  Clean, dry, healthy-appearing throughout, although very frail and  delicate in appearance.  RECTAL AND GU EXAMS:  Both deferred.  NEUROLOGIC EXAMINATION:  Grossly nonfocal and symmetrical throughout.   DIAGNOSTIC TESTS:  A 2-D echocardiogram performed November 09, 2007, is  reviewed.  The official result of this exam is not available.  On my  exam there appears to be normal left ventricular function.  There is at  least moderate to severe mitral regurgitation.  The posterior leaflet of  the mitral valve appears severely restricted in both systole and  diastole and there may be significant posterior mitral annular  calcification.  There is mild aortic insufficiency.   The ascending aorta  appears mildly enlarged.  There is at least moderate tricuspid  regurgitation.   Left and right heart catheterization performed by Dr. Aleen Campi is  reviewed.  This demonstrates three-vessel coronary artery disease with  normal left ventricular function and a least moderate mitral  regurgitation.  There is pulmonary hypertension.  PA pressures were  measured 55/24, pulmonary capillary wedge pressure of 26.  Central  venous pressure was 15.  Cardiac output ranged between 5.5 and 6.2  liters per minute. Coronary arteriography is notable for three-vessel  coronary artery disease.  There is 70% ostial stenosis of the left  circumflex coronary artery.  There is 60% stenosis of mid left anterior  descending coronary artery after takeoff of the septal perforating  branch.  There is 60-70% proximal stenosis of the right coronary artery.  There are no high-grade coronary artery lesions.  The ascending aorta is  somewhat dilated.   IMPRESSION:  [name] presents with new-onset angina in the setting of  profound anemia and atrial fibrillation with rapid ventricular response.  This was associated with very mildly elevated cardiac enzymes.  Prior to  this event the patient has been stable from a cardiac standpoint and  free of symptoms of angina or congestive heart failure.  She does have  three-vessel coronary artery disease with normal left ventricular  function, although she does not have any high-grade coronary artery  stenosis.  She has at least moderate to severe mitral regurgitation  which is presumably chronic.  The patient has not had significant  previous signs nor symptoms of congestive heart failure.  The patient is  elderly and somewhat frail, and surgical treatment for mitral  regurgitation and coronary artery disease would come with considerable  risk under elective circumstances.  In any event, I do not feel that now  is the time to consider surgical treatment  for her underlying cardiac  disease.  I would recommend completing a thorough workup for underlying  anemia.  I would not resume Coumadin until her source of bleeding has  been identified and/or corrected.  Once her acute medical problems have  been resolved and she is returned to her baseline functional status, I  would be delighted to  see her again as an outpatient in the future  should she desire to further discuss the possible risks and benefits  associated with surgical treatment of coronary artery disease and mitral  regurgitation.      Salvatore Decent. Cornelius Moras, M.D.  Electronically Signed     CHO/MEDQ  D:  11/10/2007  T:  11/11/2007  Job:  [id number]   cc:   Antionette Char, MD  Georgianne Fick, M.D.  Charlaine Dalton. Sherene Sires, MD, FCCP

## 2010-12-23 NOTE — Cardiovascular Report (Signed)
NAMEHONESTIE, KULIK              ACCOUNT NO.:  [id number]   MEDICAL RECORD NO.:  [id number]          PATIENT TYPE:  INP   LOCATION:  2913                         FACILITY:  [facility]   PHYSICIAN:  Antionette Char, MD    DATE OF BIRTH:  Mar 30, 1920   DATE OF PROCEDURE:  11/10/2007  DATE OF DISCHARGE:                            CARDIAC CATHETERIZATION   Procedure done by Dr. Aleen Campi.   PROCEDURES:  1. Right heart catheterization.  2. Left heart catheterization.  3. Left ventricular angiography.  4. Coronary cine angiography.  5. Left internal mammary angiography.  6. Angio-Seal of the right femoral artery.   INDICATION FOR PROCEDURES:  This 77 year old female had a history of  chronic atrial fibrillation and moderate mitral insufficiency documented  in 2007.  She has been on Coumadin for her chronic atrial fib and was  admitted at this time with severe anemia with a hemoglobin of 5 and  received five transfusions raising her hemoglobin to 9.1.  She had blood  in her stools for several days prior to admission.  A gastroenterology  consult was obtained and an EGD was done yesterday, finding clear  sigmoid and no evidence for blood in the gut at that time.   Dictation ended at this point.      Antionette Char, MD     JRT/MEDQ  D:  11/10/2007  T:  11/10/2007  Job:  [id number]

## 2010-12-23 NOTE — Op Note (Signed)
[name], [name] NO.:  [id number]   MEDICAL RECORD NO.:  [id number]          PATIENT TYPE:  INP   LOCATION:  2034                         FACILITY:  [facility]   PHYSICIAN:  Georgiana Spinner, M.D.    DATE OF BIRTH:  30-Jun-1920   DATE OF PROCEDURE:  DATE OF DISCHARGE:                               OPERATIVE REPORT   PROCEDURE:  Colonoscopy.   INDICATIONS:  Rectal bleeding.   ANESTHESIA:  Fentanyl 25 mcg, Versed 4 mg.   INDICATIONS:  A patient with recent anemia and concern for rectal  bleeding with possibility of facing cardiac surgery and anticoagulation.  Anesthesia was given.   PROCEDURE:  With the patient mildly sedated in the left lateral  decubitus position and the Pentax videoscopic pediatric colonoscope was  inserted in the rectum and passed under direct vision to the cecum  identified by ileocecal valve and appendiceal orifice both of which were  photographed.  From this point the colonoscope was slowly withdrawn  taking circumferential views of colonic mucosa, stopping in the  ascending colon where two polyps were seen and photographed only.  The  endoscope was then further withdrawn taking circumferential views of the  remaining colonic mucosa stopping at approximately 30 cm from the anal  verge at which point a larger polyp was seen on a stalk approximately 1  cm in size.  It too was photographed only. The endoscope was withdrawn  to the rectum which appeared normal and showed hemorrhoids on  retroflexed view.   The endoscope was straightened and withdrawn.  The patient's vital signs  and pulse oximeter remained stable.  The patient tolerated the procedure  well without apparent complication.   FINDINGS:  Polyps as described above.   PLAN:  At this point I elected not to remove these polyps.  They appear  to be benign and can be removed at a later date.  We will allow  cardiology and cardiovascular surgery to determine the immediate course  of  action.  If I had removed the polyps then anticoagulation would have  to be precluded for another 2 weeks, presumably ideally and, therefore,  at this point I elected to consider doing a repeat colonoscopy at a much  later date and remove the polyps if that proves to be clinically  relevant.           ______________________________  Georgiana Spinner, M.D.     GMO/MEDQ  D:  11/15/2007  T:  11/15/2007  Job:  [id number]   cc:   Salvatore Decent. Cornelius Moras, M.D.

## 2010-12-23 NOTE — Discharge Summary (Signed)
NAMEJAX, ABDELRAHMAN NO.:  [id number]   MEDICAL RECORD NO.:  [id number]          PATIENT TYPE:  INP   LOCATION:  5005                         FACILITY:  [facility]   PHYSICIAN:  Richardean Canal, P.A.    DATE OF BIRTH:  [birth year]   DATE OF ADMISSION:  01/17/2007  DATE OF DISCHARGE:  01/22/2007                               DISCHARGE SUMMARY   ADMITTING DIAGNOSES:  1. End-stage osteoarthritis right hip.  2. Atrial fibrillation on Coumadin, switched to Lovenox prior to      surgery.  3. Hypertension.  4. Diabetes mellitus type 2.  5. Peripheral vascular disease.  6. Mitral valve disease.  7. Pulmonary fibrosis with history of asthma.  8. Degenerative disc disease lumbar spine with a history of surgery by      Dr. Lovell Sheehan in October 2007.   DISCHARGE DIAGNOSES:  1. Status post right total hip arthroplasty.  2. Acute blood loss secondary to surgery with transfusion of packed      red blood cells.  3. Chronic AFib with RVR postop secondary to being off beta blocker,      resolved.  4. Hypertension.  5. Peripheral vascular disease.  6. Mitral valve disease.  7. Diabetes mellitus.  8. Pyrexia, resolved atelectasis.  9. Sundowners syndrome, resolved.  10.Hypokalemia, resolved.  11.Pulmonary fibrosis with history of asthma.  12.Degenerative disc disease lumbar spine.   HISTORY OF PRESENT ILLNESS:  Ms. Ballowe, 77 year old white female,  presented at least 2 years prior with gradual onset of progressively  worsening right hip pain.  History of lumbar surgery in October 2007 by  Dr. Lovell Sheehan, and she continues to have some pain in the right hip since  undergoing surgery.  No injury or prior surgeries to the right hip.  Patient described as an intermittent dull sensation of the right buttock  area and back with radiation in the groin and occasionally down the  knee.  Pain increases with any prolonged weightbearing or sitting for a  long period of time and decreases  with the use of heat, Tylenol or  Flexeril.  Hip is very stiff.  She has difficulty donning socks and  shoes.  Able to sleep on her right side, but it is somewhat  uncomfortable.  No paresthesias associated with pain.  She has had 1  cortisone injection in the hip with no relief.  She ambulates with the  use of a cane and has done so for 2-3 months prior to admission.   ALLERGIES:  CODEINE - Causes nausea and vomiting.   MEDICATIONS:  1. Avalide 300/25 1 tablet p.o. q.a.m.  2. Oxytocin  8 mg 1 tab p.o. q.h.s.  3. Klor-Con 20 mEq 2 p.o. q.a.m.  4. Furosemide 40 mg 1 tab p.o. q.a.m.  5. __________  Betadine 180 mg 1 tab p.o. q.a.m. p.r.n.  6. Flexeril 10 mg 1 tab p.o. q.a.m.  7. Ambien 10 mg p.o. q.h.s.  8. Multivitamin 1 tab p.o. q.a.m.  9. Lovenox 0.125 mg 1 tab p.o. q.a.m.  10.Toprol 100 mg 1 p.o. q.a.m.  11.Coumadin 3 mg 3 tablets p.o. q.a.m.,  last dose __________  bridged      with Lovenox.  12.__________  10 mg 1 tab p.o. q.a.m.   SURGICAL PROCEDURE:  Patient was taken to the operating room on January 17, 2007.  Patient was placed under general anesthesia and then underwent a  right total hip arthroplasty.  The following components were used:   1. Femoral head 32 mm diameter, negative 3.5 mm __________  .  2. Acetabular cup 50 mm outside diameter.  3. Acetabular liner 32 mm inside diameter.  4. A midsize 12 femoral stent.  5. Superior and posterior screws of 15 mm and 12 mm in length.   CONSULTS:  The following consults were obtained while patient was  hospitalized.   1. PT/OT.  2. Cardiology.   HOSPITAL COURSE:  Postop day 1 - Patient sitting up in a chair with some  dizziness.  No chest pain.  No shortness of breath.  Pain under control.  H&H 8.8 and 25.8.  Hypokalemia with potassium of 2.7.  Patient was given  2 units of packed red blood cells due to her symptomatic anemia and  cardiac history.  Potassium was replaced and checked.   Postop day 2 - Patient denying any  chest pain or shortness of breath.  Poor night's sleep.  Patient afebrile.  Vital signs stable, slightly  tachy at 103.  H&H has increased to 9.6 and 28.8.  Potassium is 2.8 and  continuing to be replaced.  Patient's blood pressure on January 19, 2007,  postop day 2, was 110/55.  Blood pressure medicines were held by Dr.  Sherlean Foot.  On January 20, 2007, patient had rapid ventricular response and  this is felt to be due to the fact that she was off her beta blockers.  Cardiology was called and patient was evaluated.  At time of evaluation  by cardiology, patient's RVR was controlled and it was felt that patient  could resume physical therapy, remain on the 5000 floor.  Will just  follow up with cardiology as an outpatient.  Patient remained on her  beta blocker.   At rounds on January 20, 2007, patient was seating up in a chair bedside.  No chest pain.  No shortness of breath.  Some hallucinations overnight.  Patient afebrile.  Pulse is 113.  Respiratory rate 20.  Blood pressure  143/66.  Oxygen saturation was 92% on room air.  H&H was 8.9 and 25.9.  Hypokalemia is resolved and potassium was 3.9.  CBGs ranged from 162 to  258.  On exam, patient had crackles at the bases.  A chest x-ray is  ordered to rule out pneumonia.  Patient was given 1unit of packed red  blood cells due to her cardiac history and low hemoglobin.   Postop day 4, patient progressing well with physical therapy.  Still  some trouble with bed mobility transfers.  Confusion had improved,  remains to some extent.  No chest pain, shortness of breath, nausea, or  vomiting.  T-max was 100.8.  H&H was 9.7 and 29.3.  White count was  6400.  Chest x-ray showed cardiomegaly and small bilateral pleural  effusions without definite congestive heart failure.  Minimal  atelectasis at bases.  The rest of the pulmonary __________  x2.  Patient was felt to have some Sundowners syndrome.  This was improved.   Postop day 5, no chest pain, shortness  of breath.  Progressing well with  physical therapy.  Vital signs stable.  PT was 22.9,  INR 1.9.  Patient  was discharged to home later that today in good stable condition.   LABS:  Routine labs on admission:  CBC:  All values within normal  limits.   Coags on admission:  PT was 33.3, INR 3.0.  PTT was 58.   Routine chemistries on admission:  Sodium 141, potassium 3.6, chloride  100, bicarb 28, glucose 105, BUN 16, creatinine 0.66.   Hepatic enzymes on admission:  __________  7.7, ALT was 42, AST was 33,  LT elevated at 36, ALP was 97, total bilirubin was 7.1.   Hemoglobin A1c dated January 18, 2007 was 6.4.   urinalysis on admission was negative.   EKG on January 11, 2007, showed atrial fibrillation and incomplete left  branch block.  Heart rate 65 beats per minute.  PR was given __________  .   EKG on January 20, 2007, showed a atrial fibrillation with a repaid  ventricular response.  Heart rate was 105.   X-rays 2-views of right hip dated January 14, 2007.  Patient noticed status  post total hip replacement without any complicating features.   2-D chest x-ray dated January 20, 2007 showed some mild bilateral effusions  without definite congestive heart failure.  Minimal plate-like  atelectasis at lung bases.   DISCHARGE INSTRUCTIONS:  MEDS -   1. Vicodin 5/500 one to 2 tablets every 4-6 hours for pain.  2. Lovenox 40 mg 1 injection q.8 a.m., last dose January 23, 2007.  3. Coumadin take as directed by home health pharmacy.  4. Skelaxin 400 mg 1 every 6 hours for spasms as needed.  5. Over-the-counter __________  325 mg 1 tab daily x30 days.  6. Over-the-counter laxatives and stool softeners as needed.   Patient otherwise to resume home meds.   DIET:  Diabetic diet.   ACTIVITY:  Weightbearing as tolerated.   WOUND CARE:  Patient is to keep wound clean.  Try to change dressing  daily.  Call if any signs of infection.  She may shower after 2 days if  no drainage.   Home health, PT,  per [place].  Coumadin dosing per [place] .   SPECIAL INSTRUCTIONS:  Weightbearing as tolerated with walker.   FOLLOWUP:  1. Follow up with Dr. Sherlean Foot in 14 days from surgery.  Call office at      [phone] for appointment.  2. Follow up with Dr. Aleen Campi 7-10 days as instructed.  Call office      for appointment.  3. Needs follow up with Dr. Linward Natal for diabetes mellitus in the      next 10-14 days.   CONDITION ON DISCHARGE:  Patient was discharged to home in good, stable  condition.      Richardean Canal, P.A.     GC/MEDQ  D:  03/17/2007  T:  03/18/2007  Job:  [id number]   cc:   Mila Homer. Sherlean Foot, M.D.  Antionette Char, MD  Georgianne Fick, M.D.

## 2010-12-23 NOTE — H&P (Signed)
[name], [name]              ACCOUNT NO.:  [id number]   MEDICAL RECORD NO.:  [id number]          PATIENT TYPE:  INP   LOCATION:  NA                           FACILITY:  [facility]   PHYSICIAN:  Mila Homer. Sherlean Foot, M.D. DATE OF BIRTH:  [birth year]   DATE OF ADMISSION:  01/17/2007  DATE OF DISCHARGE:                              HISTORY & PHYSICAL   CHIEF COMPLAINT:  Right hip pain for the last 2 years.   HISTORY OF PRESENT ILLNESS:  This 77 year old white female patient  presented to Dr. Sherlean Foot with a 2-year history of gradual onset but  progressively worsening right hip pain.  She does have a history of a  lumbar surgery in October 2007 by Dr. Lovell Sheehan and has continued to have  some pain after that surgery.  She has had no other injury or prior  surgery to her hip.   At this point, the pain is described as an intermittent dull sensation  over the right buttock and back area with radiation into the groin and  occasionally down to the knee.  The pain increases with any prolonged  weightbearing or sitting for prolonged period of time and then decreases  with the use of heat, Tylenol and Flexeril.  The hip is very stiff.  It  occasionally pops.  She has great difficulty putting on her socks and  shoes on that right side.  She can sleep on that right side, but it does  get uncomfortable.  She has no paresthesias associated with the pain.  She has received a cortisone shot the hip once in the past and that did  not help at all.  She has been ambulating with the use of a cane for the  last 2-3 months.   ALLERGIES:  CODEINE CAUSES NAUSEA AND VOMITING.   CURRENT MEDICATIONS:  1. Avalide 300/25 mg one tablet p.o. q.a.m..  2. Doxazosin 8 mg one tablet p.o. q.h.s..  3. Klor-Con 20 mEq 2 tablets p.o. q.a.m.Marland Kitchen  4. Furosemide 40 mg one tablet p.o. q.a.m..  5. Fexofenadine 180 mg one tablet p.o. q.a.m. p.r.n..  6. Flexeril 10 mg one tablet p.o. q.a.m.Marland Kitchen  7. Ambien 10 mg p.o. q.h.s..  8.  Multivitamin one tablet p.o. q.a.m..  9. Lanoxin 0.125 mg one tablet p.o. q.a.m.Marland Kitchen  10.Metoprolol 100 mg one tablet p.o. q.a.m.Marland Kitchen  11.Coumadin 3 mg tablets 2 tablets p.o. q.a.m. with her last dose on      January 11, 2007.  12.She will actually be bridged with Lovenox prior to surgery, and      that dose will be ordered and adjusted by Dicie Beam, pharmacist      at [facility].  13.DynaCirc 10 mg one tablet p.o. q.a.m.Marland Kitchen   PAST MEDICAL HISTORY:  1. Hypertension.  2. Chronic atrial fibrillation.  3. Peripheral vascular disease.  4. Mitral valve disease.  5. Type 2 diabetes mellitus controlled by diet.  6. Pulmonary fibrosis with a history of asthma in her younger years.  7. Degenerative disk disease lumbar spine.   PAST SURGICAL HISTORY:  1. Lumpectomies of lumps in both breasts which were  benign and 1965.  2. Lumbar surgery October 2007 by Dr. Lovell Sheehan.  3. Hysterectomy in the 90s.   She denies any complications from the above-mentioned procedures.   SOCIAL HISTORY:  She denies any history of cigarette smoking, alcohol  use or drug use.  She lives by herself in a one-story house.  She has  two children.  Her medical doctor is Dr. Nicholos Johns at [facility], and her cardiologist is Dr. Aleen Campi at [facility].  She did have a stress test done in May 2008.   FAMILY HISTORY:  Mother died at age of 59 with pneumonia.  Father died  at age of 69 with hypertension, diabetes and strokes.  She has a brother  age 31 who is alive and well and two sisters who are deceased, one alive  at age 25.  The ones who passed away had a history of stroke and stomach  cancer.  She has one son age 65, one daughter age 69, and they are alive  and well.   REVIEW OF SYSTEMS:  She does have a small cataract in the right eye.  She has had tinnitus in her ears for years.  She has a history of  pneumonia at about 2006.  She has intermittent constipation and problems  with hemorrhoids at  times.  She did have the asthma on and off when she  was younger.  She did have nosebleed once 2 weeks ago with a sinus  infection.  She does have occasional ankle swelling at night.  She had a  kidney stone in the 1960s.  She does have headaches on occasion due to  allergies.  She does have some nervous tension.  All other systems are  negative and noncontributory.   PHYSICAL EXAMINATION:  GENERAL:  Well-developed, well-nourished, thin  white female in no acute distress.  Talks easily with examiner.  Accompanied by her son.  Walks with an antalgic gait, but mood and  affect are appropriate.  Height 5 feet 4 inches, weight 130 pounds, BMI  is 21-1/2.  VITAL SIGNS:  Temperature 98.0 degrees Fahrenheit, pulse 72,  respirations 14 and BP 136/50.  HEENT:  Normocephalic, atraumatic without frontal or maxillary sinus  tenderness to palpation.  Conjunctiva pink.  Sclerae anicteric.  PERLA.  EOMs intact.  No visible external ear deformities.  Hearing grossly  intact.  Tympanic membranes pearly gray bilaterally with good light  reflex.  Nose and nasal septum midline.  Nasal mucosa pink and moist  without exudates or polyps noted.  Buccal mucosa pink and moist.  Dentition in good repair.  Pharynx without erythema or exudates.  Tongue  and uvula midline.  Tongue without fasciculations and uvula rises  equally with phonation.  NECK:  No visible masses or lesions noted.  Trachea midline.  No  palpable lymphadenopathy or thyromegaly.  Carotids +2 bilaterally  without bruits.  Full range of motion nontender to palpation along the  cervical spine.  CARDIOVASCULAR:  Heart rate and rhythm are irregular.  S1-S2 present  with a grade 3/6 systolic murmur heard best at the left midclavicular  line fifth intercostal space.  RESPIRATORY:  Respirations even and unlabored.  Breath sounds clear to  auscultation bilaterally without rales or wheezes noted. ABDOMEN:  Rounded abdominal contour.  Bowel sounds  present x4 quadrants.  Soft, nontender to palpation without hepatosplenomegaly nor CVA  tenderness.  Femoral pulses +2 bilaterally.  Nontender to palpation  along the vertebral column.  BREASTS/GU/RECTAL/PELVIC:  These exams deferred  at this time.  MUSCULOSKELETAL:  No obvious deformities bilateral upper extremities  with full range of motion these extremities without pain.  Radial pulses  +2 bilaterally.  She has full range of motion of her knees, ankles and  toes bilaterally.  DP and PT pulses are +2.  No lower extremity edema at  this time.  No calf pain with palpation.  Negative Homans' sign  bilaterally.   Left hip has full extension and flexion about 110 degrees with full  internal-external rotation and abduction adduction about 15-20 degrees.  There is no pain with range of motion of the left hip.  No pain with  palpation about the left hip.  Right hip is lacking about 5 degrees of  full extension and has flexion only to about 80 degrees and then the hip  externally rotates.  She has absolutely no internal-external rotation  and no real abduction or adduction.  She has pain with any attempts at  these movements.  Mild pain with palpation in the right groin.  NEUROLOGIC::  Alert and oriented x3.  Cranial nerves II-XII are grossly  intact.  Strength 5/5 bilateral upper and lower extremities.  Rapid  alternating movements intact.  Deep tendon reflexes 2+ bilateral upper  and lower extremities.   RADIOLOGIC FINDINGS:  X-rays taken of her right hip in May 2007 show  severe osteoarthritis of the hip.  Repeat films showed these findings  were consistent with that.  New films were taken in February 2008.   IMPRESSION:  1. End-stage osteoarthritis right hip.  2. Atrial fibrillation on chronic Coumadin and she will be bridged      with Lovenox prior to surgery.  3. Hypertension.  4. Type 2 diabetes mellitus.  5. Peripheral vascular disease.  6. Mitral valve disease.  7. Pulmonary  fibrosis with a history of asthma.  8. Degenerative disk disease lumbar spine with a history of surgery by      Dr. Lovell Sheehan in October 2007.   PLAN:  Ms. Carolan will be admitted to [facility] on January 17, 2007, where she will undergo a right total hip arthroplasty by Dr.  Raymon Mutton.  She will undergo all the routine preoperative  laboratory tests and studies prior to this procedure.  If we have any  medical issues while she is hospitalized we will consult [facility].      Legrand Pitts. Duffy, P.A.    ______________________________  Mila Homer. Sherlean Foot, M.D.    KED/MEDQ  D:  01/11/2007  T:  01/11/2007  Job:  [id number]

## 2010-12-25 ENCOUNTER — Telehealth: Payer: Self-pay | Admitting: *Deleted

## 2010-12-25 DIAGNOSIS — D649 Anemia, unspecified: Secondary | ICD-10-CM

## 2010-12-25 NOTE — Telephone Encounter (Signed)
1. Lab tomorrow - CBC for  285.9  2. We can try celebrex 200mg  once a day - samples can be picked up when she comes in for lab

## 2010-12-25 NOTE — Telephone Encounter (Signed)
Pt's son called - He says he discussed pt's chronic pain at last OV. She has c/o "not feeling well" x 4 to 5 days and has increased pain in her hip. Please advise.

## 2010-12-25 NOTE — Telephone Encounter (Signed)
For flares of pain if no already taking APAP that would be a good starting point - 1000mg  tid as needed.

## 2010-12-25 NOTE — Telephone Encounter (Signed)
Son informed. Samples ready. Hold phone note for results of CBC as family is very anxious for these.

## 2010-12-25 NOTE — Telephone Encounter (Signed)
Pt has already been taking vicodin 5/325 and 500 mg tylenol in am an PM. She c/o fatigue and pain that is not controlled by the previously mentioned meds. Son says anytime pt c/o increase in fatigue family worries about low iron/hemaglobin. He also says MD discussed alt pain management option ? celebrex for pt at last OV. Please advise.

## 2010-12-26 ENCOUNTER — Other Ambulatory Visit (INDEPENDENT_AMBULATORY_CARE_PROVIDER_SITE_OTHER): Payer: Medicare Other

## 2010-12-26 DIAGNOSIS — D649 Anemia, unspecified: Secondary | ICD-10-CM

## 2010-12-26 LAB — CBC WITH DIFFERENTIAL/PLATELET
Basophils Absolute: 0 10*3/uL (ref 0.0–0.1)
Eosinophils Absolute: 0.1 10*3/uL (ref 0.0–0.7)
HCT: 31 % — ABNORMAL LOW (ref 36.0–46.0)
Hemoglobin: 10.5 g/dL — ABNORMAL LOW (ref 12.0–15.0)
Lymphs Abs: 0.8 10*3/uL (ref 0.7–4.0)
MCHC: 33.8 g/dL (ref 30.0–36.0)
MCV: 93.5 fl (ref 78.0–100.0)
Monocytes Absolute: 0.5 10*3/uL (ref 0.1–1.0)
Neutro Abs: 3.3 10*3/uL (ref 1.4–7.7)
RDW: 17.9 % — ABNORMAL HIGH (ref 11.5–14.6)

## 2010-12-26 NOTE — Cardiovascular Report (Signed)
[facility]  Patient:    [name], [name] Visit Number: [id number] MRN: [id number]          Service Type: DSU Location: [facility] 2855 01 Attending Physician:  Silvestre Mesi Dictated by:   Aram Candela Aleen Campi, M.D. Proc. Date: 05/03/01 Admit Date:  05/03/2001   CC:         Soyla Murphy. Renne Crigler, M.D.  Peripheral vascular angiography lab   Cardiac Catheterization  REFERRING PHYSICIAN:  Soyla Murphy. Renne Crigler, M.D.  PROCEDURES: 1. Placement of catheter in abdominal aorta via percutaneous stick right    femoral artery. 2. Abdominal aortogram. 3. Selective renal artery angiography bilaterally 4. Perclose of the right femoral artery.  INDICATION FOR PROCEDURES:  This 77 year old female has a history of very difficult to control hypertension with acceleration in the last year and intolerance to several classes of antihypertensive medications. She had a renal artery duplex ultrasound done which showed a high velocity in her left renal artery. Her right renal artery was not visualized. She was then scheduled for selective renal artery angiography.  PROCEDURE:  After signing an informed consent, the patient was premedicated with 50 mg of Benadryl intravenously and brought to the cardiac catheterization lab. Her groin was prepped and draped in a sterile fashion and anesthetized locally with 1% lidocaine. A 5-French introducer sheath was inserted percutaneously into the right femoral artery. A 5-French pigtail catheter was inserted through the right femoral artery sheath and advanced to the abdominal aorta. A midstream injection was made into the abdominal aorta. Pressures were recorded. We then inserted a 5-French shepherds hook short diagnostic catheter which was also advanced to the mid abdominal aorta. The tip of this catheter was engaged in both dominant renal arteries and also into the small renal arteries found and noted during the abdominal aortogram.  The patient tolerated the procedure well, and no complications were noted at the end of the procedure. The catheter and sheath were removed from the right femoral artery, and hemostasis was easily obtained with the Perclose closure system. Medications given:  Nubain 2 mg IV.  HEMODYNAMIC DATA: Aortic pressure:  165/60. Heart rate:  60 and atrial fibrillation.  CINE FINDINGS:  Abdominal aortogram:  Midstream injection into the abdominal aorta showed dominant right and left renal arteries and two smaller accessory renal arteries bilaterally. The ostium was not seen on the abdominal aortogram on any of the renal arteries. There was mild atherosclerotic plaque in the infrarenal abdominal aorta which causes a mild segmental narrowing but no significant stenosis, and there was normal flow. Both iliac arteries appeared normal. Right renal artery:  Selective injection into the dominant right renal artery shows a mild plaque in the ostium causing a 20% ostial stenosis. There is very normal flow into this dominant right renal artery. Left renal artery; There is a mild plaque at its ostium causing a less than 20% stenosis. There is normal flow into a dominant left renal artery. Secondary renal arteries: Injections into the smaller renal arteries show a normal-appearing small second left renal artery and an abnormal-appearing small second right renal artery. There is a 50% ostial lesion in this small right renal artery. Both small accessory renal arteries supply the lower pole of both kidneys.  FINAL DIAGNOSES: 1. Mild stenosis ostial right renal artery, 20%. 2. Mild stenosis less than 20% ostial left renal artery. 3. Small accessory renal arteries bilaterally supplying the lower poles of    both kidneys with 50% stenosis of the small  right renal artery and    normal-appearing small left renal artery. 4. Mild atherosclerosis of the infrarenal abdominal aorta with segmental    narrowing but  without significant stenosis and normal flow. 5. Normal iliac arteries bilaterally. 6. Successful Perclose of the right femoral artery.  DISPOSITION:  The patient will be monitored on the short-stay unit prior to discharge later today. Will follow up in the office in several weeks. Dictated by:   Aram Candela. Aleen Campi, M.D. Attending Physician:  Silvestre Mesi DD:  05/03/01 TD:  05/03/01 Job: [id number]

## 2010-12-26 NOTE — Telephone Encounter (Signed)
Son informed.

## 2010-12-26 NOTE — H&P (Signed)
[name], [name] NO.:  [id number]   MEDICAL RECORD NO.:  [id number]          PATIENT TYPE:  EMS   LOCATION:  MAJO                         FACILITY:  [facility]   PHYSICIAN:  Gertha Calkin, M.D.DATE OF BIRTH:  12-Nov-1919   DATE OF ADMISSION:  09/13/2004  DATE OF DISCHARGE:                                HISTORY & PHYSICAL   PRIMARY CARE PHYSICIAN:  Georgianne Fick, M.D.   CARDIOLOGIST:  Antionette Char, M.D.   CHIEF COMPLAINT:  Progressive shortness of breath.   HISTORY OF PRESENT ILLNESS:  A pleasant 77 year old Caucasian female who  presents with a chief complaint of progressive shortness of breath over the  last 2-3 weeks.  Her past medical history includes atrial fib, hypertension,  chronic allergies, also anxiety.  She was recently treated with antibiotics  for bronchitis, approximately a week and a half to two weeks ago.  She  finished her antibiotic treatment this past Thursday or Friday (2-3 days  prior to admission).  She denies any chest tightness, chest pain with her  shortness of breath.  No palpitations, nausea, vomiting, diaphoresis.  No  neck of jaw pain.  She states that this has gotten worse over the last  several days.  She is positive for orthopnea.  She also has a positive pedal  edema that is worse than her usual baseline over the past 2-3 days.  She  denies any sick contacts.  Her only positive review of systems is diarrhea.  This began only today and was qualitatively only positive for loose stools,  no blood.   PAST MEDICAL HISTORY:  1.  Afib.  2.  Hypertension.  3.  Chronic allergies.  4.  Anxiety.  5.  She also had a cath done in 2002 which showed some mild renal artery      stenosis but no significant abdominal aorta or cardiac abnormalities.   MEDICATIONS:  She has a list with no doses, DynaCirc, Avalide, Coumadin,  Lanoxin, hydrochlorothiazide, another allergy medication, Metoprolol,  cyclobenzaprine, doxazosin, and  lorazepam.   PAST FAMILY HISTORY:  Noncontributory.   SOCIAL HISTORY:  She lives in [place] by herself.  She denies any  tobacco, alcohol, or IV drug use ever.   REVIEW OF SYSTEMS:  Positive only for diarrhea, nonbloody loose in  appearance only started today.  No fevers, chills.  No weight changes.  No  nausea, vomiting.  Denies any problems with night sweats or recent travel  history.  No sick contacts.  No headaches, blurred vision.   PHYSICAL EXAMINATION:  VITAL SIGNS:  Temperature is 97.3, blood pressure  171/79, heart rate of 88, respiratory rate 28, sating 97% on 2 liters nasal  cannula.  HEENT:  Unremarkable.  CARDIOVASCULAR:  Irregularly irregular heart rate with a 3/6 systolic  ejection murmur that radiates into her carotids.  There is no radiation into  her axilla.  No rubs or gallops present.  CHEST:  Sounds clear except decreased breath sounds in the bases  bilaterally, right worse than left.  There is no crackles or wheezes noted.  ABDOMEN:  Nondistended, nontender.  Bowel  sounds are present.  EXTREMITIES:  There is 1+ to 2+ edema bilaterally just about half way up her  shins.  This is pitting edema.  Otherwise pulses are intact and symmetric.  Upper and lower extremity strength is without deficits.  NEUROLOGIC:  She has cranial nerves that are intact.  There are no focal  sensory deficits or muscle strength deficits.  PSYCHOLOGICAL:  She does complain of anxiety and jitteriness at this time.   LABS:  Her CBC showed a white count of 8.9, hemoglobin of 11.6, hematocrit  of 34.3, MCV of 86.4, platelets of 215.  Her sodium is 134, potassium 3.1,  chloride of 98, BUN of 11, glucose of 143, a bicarb of 29.5.  This is an I-  STAT and her creatinine from the I-STAT is 0.7.  Chest x-ray shows CHF with  interstitial edema and pleural effusions and there is also impressive left  atrial enlargement and left atrial appendage increase in size consistent  with mitral valve  disease and also read as right lower lobe/middle lobe  pneumonia.   ASSESSMENT:  1.  Progressive shortness of breath.  2.  Atrial fibrillation.  3.  Hypokalemia.  4.  Hypertension.  5.  Diarrhea.  6.  Anxiety.   PLAN:  1.  We will admit to a tele bed.  2.  Rule out any cardiac etiology with enzymes, watch overnight on      telemetry.  3.  We will get an echo if her enzymes are positive.  4.  Check a TSH level.  5.  Diurese to help with her symptoms.  6.  Also check a fasting lipid profile.  7.  We will continue her blood pressure medications; however, we will have      to start empirically with the doses since those are not available at      this time.  8.  We will continue her Coumadin and have pharmacy to dose that. Check a PT      INR daily for that.  9.  We will replenish her potassium.  10. Since she has recently been on antibiotics with a complaint of diarrhea,      we will check a C. diff toxin assay.  11. In the morning, we will have followup labs which include CBC, B-MET, and      a chest x-ray, as well as an EKG.  Note, her EKG, on admission, only      shows Afib with old possible anterior infarct.  No other      abnormalities noted.  12. If there is no significant change with diuresis in the morning, we will      consider starting empiric antibiotics for community-acquired pneumonia,      however, is not impressive on x-ray or on exam.      JD/MEDQ  D:  09/13/2004  T:  09/14/2004  Job:  [id number]   cc:   Georgianne Fick, M.D.  [address]  [place]  [place] [address]  Fax: [phone]   Antionette Char, MD  [address]  [place]  [place] [address]  Fax: [phone]

## 2010-12-26 NOTE — Op Note (Signed)
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   PROCEDURES:  L3 laminectomy for resection of intradural, intraspinal tumor  using microdissection; L4 laminectomy to treat the severe spinal stenosis at  L4-5; posterior segmental instrumentation with Legacy titanium pedicle  screws and rods, L3 to L5; posterolateral arthrodesis, L3-4 and L4-5, with  local morcellized autograft and Vitoss bone graft extender.   SURGEON:  Cristi Loron, M.D.   ASSISTANT:  Hilda Lias, M.D.   ANESTHESIA:  General endotracheal.   ESTIMATED BLOOD LOSS:  350 cc.   SPECIMENS:  Tumor.   DRAINS:  None.   COMPLICATIONS:  None.   BRIEF HISTORY:  The patient is an 77 year old white female, who suffered  from back and bilateral leg pain, consistent with neurogenic claudication.  She was treated extensively nonsurgically, but failed to improve.  She was  worked up with a lumbar MRI, which demonstrated she had severe spinal  stenosis at L4-5, as well as moderate spinal stenosis at L3-4 with an  intradural, intraspinal tumor.  I discussed the situation with the patient  and we discussed the various treatment options, including surgery.  The  patient has weighed the risks, benefits and alternatives of surgery and  desired to proceed with a lumbar decompression and fusion and removal of her  intradural tumor.   DESCRIPTION OF PROCEDURES:  The patient  was brought to the operating room by  the anesthesia team.  General endotracheal anesthesia was induced.  The  patient was carefully turned to the prone position on the Wilson frame.  Her  lumbosacral region was then prepared with Betadine Scrub and Betadine  Solution.  Sterile drapes were applied.  I then injected the area to be  incised with Marcaine with epinephrine solution.  I used a scalpel to make a  linear midline incision over the L3-4 and 4-5 interspaces.  I used  electrocautery to perform a subperiosteal dissection, exposing the spinous  processes and laminae of L3, 4 and 5.  We obtained intraoperative radiograph  to confirm our location.  We then inserted a VersaTack retractor for  exposure.  We began by incising the interspinous ligament at L3-4 and 4-5  with a scalpel.  We removed the L4 spinous process and part of the L4 lamina  with a Leksell rongeur, and saved this bone, and later cleared it of soft  tissue, morcellized it, and used it in the fusion process as local autograft  bone.  I then used a high-speed drill to perform bilateral L3 and L4  laminotomies.  We completed the L4 laminectomy  with a Kerrison punch to  decompress the severe stenosis at L4-5.  I performed foraminotomy about the  bilateral L4 and L5 nerve roots, completing the decompression at L4-5.  I  widened the laminotomy at L3 as well to decompress the thecal sac at L3-4 to  treat the spinal stenosis at that level, and also to provide exposure to the  dura for removal of the L3-4 intradural tumor.  We then brought the  operating room microscope into the field, and under its magnification and  illumination, I incised the dura with the 15-blade scalpel.  We tacked up  the dural edges.  The arachnoid at this point was intact.  We then incised  the arachnoid.  We tacked up the arachnoid edges to the dura with hemoclips.  Then, using microdissection, we dissected through the cauda equina and we  identified the  intradural tumor.  We used the nerve stimulator to stimulate  the tumor, as well as what appeared to be the phylum terminalis both  proximal and distal to the tumor.  There was no way to dissect the tumor  away from what we assumed was the phylum terminalis; so we stimulated the  structure both proximal and distal to the tumor, and we got no EMG evidence  that this was an important nerve, and no change in the SSEPs.  We,  therefore, coagulated the phylum terminalis proximal and distal to the  tumor, incised it, and then removed the tumor in 1 piece.  We then obtained  hemostasis using bipolar electrocautery.  We irrigated the subarachnoid  space out with saline solution.  We then removed the hemoclips from the  dural edges, and then we reapproximated the patient's dura with a running 6-  0 Prolene suture.  We got a good watertight closure.  We also had Anesthesia  Valsalva the patient and there was no dural leak.  We then inspected the L3-  4 and the L4-5 intervertebral disks with the coronary dilator.  They were  bulging somewhat, but there were no disk herniations.  At this point, we had  a good decompression of the bilateral L4 and 5 nerve roots, as well as the  thecal sac.   We now turned our attention to the instrumentation.  Under fluoroscopic  guidance, I cannulated the bilateral L3, 4 and 5 with the bone probe.  We  tapped the pedicles with a 5.5-mm tap.  We palpated inside the tapped  pedicles with a ball probe to rule out cortical breaches, and then inserted  6.5 x 15-mm pedicle screws bilaterally at L3, 4 and 5 into the L3, 4 and 5  pedicles under fluoroscopic guidance.  We then palpated along the medial  aspects of the bilateral L3, 4 and 5 pedicles, noting there were no cortical  breaches and the nerve roots were not injured.  We then connected the unilateral screws with a lordotic rod, which we fastened in place with the  caps tightened appropriately, completing the  instrumentation.   We now turned our attention to posterolateral arthrodesis.  We used the  [employer] and electrocautery to clear soft tissue from the L3-4 and 4-  5 facets.  I then decorticated the facets with the high-speed drill.  Then,  we laid a combination of local autograft bone and Vitoss bone graft extender  over these decorticated and posterolateral structures, completing the  posterolateral arthrodesis.  We then irrigated the wound out with Bacitracin  solution and removed the solution.  We then placed a seal in the epidural  space over the durotomy closure.  We then removed the retractor, and then  reapproximated the patient's thoracolumbar fascia with interrupted #1 Vicryl  sutures, subcutaneous tissues with interrupted 2-0 Vicryl suture, and the  skin with Steri-Strips and benzoin.  The wound was then coated with  Bacitracin ointment.  A sterile dressing was applied. The drapes were  removed, and the patient was  subsequently returned to a supine position, where she was extubated by the  anesthesia team and transported to the postanesthesia care unit in stable  condition.  All sponge, instrument and needle counts were correct at the end  of this case.      Cristi Loron, M.D.  Electronically Signed     JDJ/MEDQ  D:  05/26/2006  T:  05/27/2006  Job:  [id number]   cc:   Hilda Lias, M.D.

## 2010-12-26 NOTE — Discharge Summary (Signed)
[name], [name] NO.:  [id number]   MEDICAL RECORD NO.:  [id number]          PATIENT TYPE:  INP   LOCATION:  3041                         FACILITY:  [facility]   PHYSICIAN:  Cristi Loron, M.D.DATE OF BIRTH:  Jun 24, 1920   DATE OF ADMISSION:  05/26/2006  DATE OF DISCHARGE:  06/02/2006                               DISCHARGE SUMMARY   BRIEF HISTORY:  The patient is an 77 year-old white female, who has  suffered from back and bilateral leg pain consistent with neurogenic  claudication.  She was treated extensively, not surgically, but failed  to improve.  She was worked up with a lumbar MRI, which demonstrated she  has severe spinal stenosis at L4-5, as well as moderate spinal stenosis  at L3-4 with an intraspinal tumor.  I discussed this situation with the  patient and her family.  We discussed the various treatment options  including surgery.  The patient has weighed the risks, benefits, and  alternatives to surgery and has decided to proceed with a lumbar  decompression and fusion with removal of her intradural tumor.   For further details of this admission, please refer to the typed history  and physical.   HOSPITAL COURSE:  I admitted the patient to [facility] on  May 26, 2006.  On the day of admission, I performed an L3-L4 and L4-  L5 decompression and fusion and removal of her intradural tumor.  The  surgery went well without complications.  (For full details of this  operation, please refer to the typed operative note).  The patient had a  bout of hypokalemia and she was treated with potassium and this  resolved.  Her discharge potassium was 3.8.  She was instructed to  follow up with her medical doctor regarding this.   POSTOPERATIVE COURSE:  The patient's postoperative course was as  follows:  We had PT, OT, and PMR see the patient.  They did not think  that she needed inpatient rehab.  By postoperative day 7, June 02, 2006, the  patient was afebrile.  Vital signs were stable.  She was  eating well, ambulating adequately, and she was requesting discharge to  home.  She was therefore discharged home on June 02, 2006.   DISCHARGE INSTRUCTIONS:  The patient was given discharge instructions.  She was instructed to follow up with me in 4 weeks.   DISCHARGE PRESCRIPTIONS:  Lortab 10 mg #50 one p.o. q.4 hours p.r.n. for  pain.   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   PROCEDURES PERFORMED:  L3 laminectomy for resection of intradural  intraspinal tumor using microdissection; L4 laminectomy to treat the  severe spinal stenosis at L4-5; posterior segmental instrumentation with  Legacy titanium PEEK screws and rods L3-L5; posterolateral arthrodesis  L3-4 and L4-5 with local morcellated autograft bone, and Vitoss bone  graft extender.      Cristi Loron, M.D.  Electronically Signed     JDJ/MEDQ  D:  07/12/2006  T:  07/12/2006  Job:  [id number]

## 2010-12-26 NOTE — Telephone Encounter (Signed)
Hemoglobin 10.5 - good.

## 2010-12-26 NOTE — Discharge Summary (Signed)
[name], [name]              ACCOUNT NO.:  [id number]   MEDICAL RECORD NO.:  [id number]          PATIENT TYPE:  INP   LOCATION:  5524                         FACILITY:  [facility]   PHYSICIAN:  Mobolaji B. Bakare, M.D.DATE OF BIRTH:  07/31/1920   DATE OF ADMISSION:  09/13/2004  DATE OF DISCHARGE:  09/16/2004                                 DISCHARGE SUMMARY   PRIMARY CARE PHYSICIAN:  Georgianne Fick, M.D.   CARDIOLOGIST:  Antionette Char, M.D.   FINAL DIAGNOSES:  1.  Congestive heart failure, probably basilic dysfunction.  Echocardiogram      report pending.  2.  Pneumonia, right lower lobe/right middle lobe.  3.  Hypokalemia.   SECONDARY DIAGNOSES:  1.  Atrial fibrillation.  2.  Hypertension.   CHIEF COMPLAINT:  Shortness of breath.   [name] is a pleasant 77 year old white female with a medical  history of atrial fibrillation and hypertension.  She presented with  progressive shortness of breath.  She was treated as an outpatient for  bronchitis for 2-3 weeks prior to presentation with two different  antibiotics; however, the cough seems to have improved, but she continues to  have postnasal drip.  Shortness of breath got worse a couple of days prior  to presentation, and she admitted to having orthopnea and some pedal edema.  There was no fever.  No chest pain.  No palpitations, diaphoresis.   On initial evaluation in the emergency department, chest x-ray done showed  features compatible with CHF, interstitial edema, and pleural effusion with  a strong suspicion for mitral valve disease.  In addition, there were  features compatible with right middle and lower lobe pneumonia.  White cell  count on admission was normal.   PERTINENT PHYSICAL FINDINGS:  INITIAL VITALS:  Temperature 97.3, blood  pressure 171/79, heart rate 88, respiratory rate 28, O2 sats 97% on 2 liters  nasal cannula.  CARDIOVASCULAR:  The main findings were in the cardiovascular and  lung  exams.  She has an irregularly irregular heart with a grade 3/6 systolic  murmur radiating to the axilla.  LUNGS:  There were decreased breath sounds bilaterally with right basilar  rales.  No wheeze.  She did have elevated JVD and bilaterally pitting pedal  edema, 2+.  Peripheral pulses were intact and symmetrical.   The rest of the physical examination was within normal limits.   PERTINENT LABORATORY FINDINGS:  White blood cells 8.9, hemoglobin 11.6,  hematocrit 34.6, MCV 86, platelets 215.  Neutrophils 88, lymphocytes 9.  BNP  172.  Sodium 135, potassium 3.1, chloride 96, CO2 28, glucose 148, BUN 10,  creatinine 0.6, total bilirubin 1.3, alkaline phosphatase 107, AST 24, ALT  24, total protein 7.3, albumin 3.8, calcium 9.2.  UA was insignificant.  She  ruled out for MI with negative troponin x2.  TSH 2.762.  Blood cultures x1  negative.   HOSPITAL COURSE:  1.  CHF:  Patient has not been told that she has ever had CHF, and according      to the medication, she is not on any diuretics.  She  was diuresed with      IV Lasix 20 mg b.i.d.  She diuresed well and was put on a low salt diet      and fluid restriction.  Her shortness of breath improved quite      significantly in 48 hours, and she was able to ambulate without      difficulty.  She had a 2D echo, and report was not available at the time      of discharge.  Patient was strong enough to go home.  She requested to      be discharged.  I am waiting for a report of a 2D echo.  She was sent up      to follow up with echo report as an outpatient.  Appointment was set for      the February 9 at 3:15 p.m.  She is currently not on any ACE inhibitor.      We defer decision to Dr. Aleen Campi.  I did not have echo report prior to      discharge to determine the ejection fraction.  2.  Community-acquired pneumonia:  Given the history of recent upper      respiratory tract infection, she probably came down with pneumonia.  She      was  treated with IV ceftriaxone and Zithromax.  She did not have any      elevated temperature or white cell count.  Patient stated that she had      received pneumococcal and influenza vaccine, and she was not given these      vaccines during this hospitalization.  Chest x-ray report indicated      right lower lobe and middle lobe pneumonia.  The question of aspiration      was entertained, and the patient was evaluated by speech therapist.  It      was felt that she does not have any form of aspiration.  Patient does      have postnasal drip; hence, she was given Nasonex nasal spray, and this      helped somewhat post nasal discharge.  3.  Hypertension:  This remained controlled during this hospitalization.  4.  Atrial fibrillation:  Relatively under control; however, she did have      atrial fibrillation with rapid ventricular response on activity.  She      was continued on Lopressor, digoxin, and Coumadin.  5.  Hypokalemia:  This was corrected with potassium chloride.   CONDITION ON DISCHARGE:  Patient was able to ambulate without assistance.  She would like to go home and follow up with echocardiogram report in the  office, and she was fit for discharge.   DISCHARGE MEDICATIONS:  1.  Lasix 40 mg p.o. q.d.  2.  Vantin 400 mg p.o. b.i.d.  3.  Zithromax 500 mg once daily.  4.  Tessalon Perles 200 mg t.i.d.  5.  Nasonex nasal spray to each nostril once daily.  6.  She is to resume her home medications.   DIET:  Patient was counseled on a low-salt diet.   [facility] will follow up with patient at home.   FOLLOW-UP APPOINTMENTS:  1.  Dr. Aleen Campi on Thursday, September 18, 2004 at 3:15 p.m.  2.  Dr. Nicholos Johns on September 25, 2004 at 9:50 a.m.      MBB/MEDQ  D:  09/16/2004  T:  09/16/2004  Job:  [id number]   cc:   Antionette Char, MD  [phone] [facility]  [place]run[place] Genera [address]  [place] [address]  Fax: [phone]   Georgianne Fick, M.D. [address]   [place]  [place] [address]  Fax: [phone]

## 2010-12-26 NOTE — Telephone Encounter (Signed)
CBC results are ready, please advise

## 2010-12-31 ENCOUNTER — Telehealth: Payer: Self-pay | Admitting: *Deleted

## 2010-12-31 NOTE — Telephone Encounter (Signed)
Pt's son called - They accidentally discarded temazepam rx (filled 12/18/10). OK for early refill?

## 2010-12-31 NOTE — Telephone Encounter (Signed)
Ok for early refill 

## 2011-01-01 ENCOUNTER — Other Ambulatory Visit: Payer: Self-pay | Admitting: *Deleted

## 2011-01-01 MED ORDER — TEMAZEPAM 15 MG PO CAPS: 15.0000 mg | ORAL_CAPSULE | Freq: Every evening | ORAL | Status: DC | PRN

## 2011-01-01 MED ORDER — CELECOXIB 200 MG PO CAPS: 200.0000 mg | ORAL_CAPSULE | Freq: Every day | ORAL | Status: DC

## 2011-01-01 NOTE — Telephone Encounter (Signed)
Called in RX in orders only encounter.

## 2011-01-12 ENCOUNTER — Telehealth: Payer: Self-pay | Admitting: Cardiovascular Disease

## 2011-01-12 NOTE — Telephone Encounter (Signed)
Pt son has question re pt condition. Pt needs hip replacement pt son has some concerns.

## 2011-01-12 NOTE — Telephone Encounter (Signed)
Spoke with pt son, pt is needing to have hip replacement, she is unable to walk. The pt has been seen by dr Sherlean Foot and he is aware of her increased risk for any surgery. Dr Sherlean Foot ask the son to ask dr Eden Emms what the chances are of the pt having problems (50/50)(75%)? During or after surgery. Will forward for dr Eden Emms review [name]

## 2011-01-13 NOTE — Telephone Encounter (Signed)
Risk of cardiac complication extremely high ? 75%  She has afib not on coumadin, severe 3VD and severe MR.  There for she is at high risk for MI, CHF and arrhythmia

## 2011-01-14 ENCOUNTER — Other Ambulatory Visit: Payer: Self-pay | Admitting: Internal Medicine

## 2011-01-15 NOTE — Telephone Encounter (Signed)
Spoke with pt son, he is aware of dr Eden Emms recommendations [name]

## 2011-01-15 NOTE — Telephone Encounter (Signed)
Pt's son rtn call from yesterday [phone]

## 2011-01-20 ENCOUNTER — Telehealth: Payer: Self-pay | Admitting: *Deleted

## 2011-01-20 DIAGNOSIS — D649 Anemia, unspecified: Secondary | ICD-10-CM

## 2011-01-20 NOTE — Telephone Encounter (Signed)
Daughter is req to know when pt is due for labs again?

## 2011-01-20 NOTE — Telephone Encounter (Signed)
Daughter aware.

## 2011-01-20 NOTE — Telephone Encounter (Signed)
If she is doing well repeat Hgb on June 18th. Order entered

## 2011-01-26 ENCOUNTER — Other Ambulatory Visit (INDEPENDENT_AMBULATORY_CARE_PROVIDER_SITE_OTHER): Payer: Medicare Other

## 2011-01-26 DIAGNOSIS — D649 Anemia, unspecified: Secondary | ICD-10-CM

## 2011-01-29 ENCOUNTER — Other Ambulatory Visit (INDEPENDENT_AMBULATORY_CARE_PROVIDER_SITE_OTHER): Payer: Medicare Other | Admitting: Internal Medicine

## 2011-01-29 ENCOUNTER — Telehealth: Payer: Self-pay

## 2011-01-29 DIAGNOSIS — D649 Anemia, unspecified: Secondary | ICD-10-CM

## 2011-01-29 NOTE — Telephone Encounter (Signed)
Pt's son called requesting lab results

## 2011-01-29 NOTE — Telephone Encounter (Signed)
Call pt: hemoglobin is very stable at 10,3g. YEAH!  Repeat lab in one month (order entered)

## 2011-01-29 NOTE — Telephone Encounter (Signed)
Informed pt hgb 10.3 and to repeat labs in 1 month

## 2011-01-29 NOTE — Telephone Encounter (Signed)
I have already spoken with pt this am

## 2011-01-30 ENCOUNTER — Encounter: Payer: Self-pay | Admitting: Internal Medicine

## 2011-01-30 ENCOUNTER — Telehealth: Payer: Self-pay | Admitting: *Deleted

## 2011-01-30 DIAGNOSIS — D649 Anemia, unspecified: Secondary | ICD-10-CM

## 2011-01-30 NOTE — Telephone Encounter (Signed)
Son is req a call - Pt c/o nausea and in the past this symptom went along w/low hemoglobin. He would like lab results and advisement for nausea.

## 2011-01-30 NOTE — Telephone Encounter (Signed)
hgb 10.3 and stable as of June 18th. WQe may have already communicated this information. Needs repeat next week if there is a concern that she may have bled and dropped her counts.

## 2011-01-30 NOTE — Telephone Encounter (Signed)
Spoke with pts son Tammy Sours. He states they will come in for repeat labs next week. Order put in for pt

## 2011-02-02 ENCOUNTER — Telehealth: Payer: Self-pay

## 2011-02-02 ENCOUNTER — Other Ambulatory Visit (INDEPENDENT_AMBULATORY_CARE_PROVIDER_SITE_OTHER): Payer: Medicare Other

## 2011-02-02 DIAGNOSIS — D649 Anemia, unspecified: Secondary | ICD-10-CM

## 2011-02-02 NOTE — Telephone Encounter (Signed)
Patient daughter called stating that MD wanted pt to come back in for a recheck on labs. She wants to bring patient by today. Orders already placed in computer per Epic.

## 2011-02-03 ENCOUNTER — Ambulatory Visit (INDEPENDENT_AMBULATORY_CARE_PROVIDER_SITE_OTHER): Payer: Medicare Other | Admitting: Internal Medicine

## 2011-02-03 DIAGNOSIS — K3189 Other diseases of stomach and duodenum: Secondary | ICD-10-CM

## 2011-02-03 DIAGNOSIS — M1990 Unspecified osteoarthritis, unspecified site: Secondary | ICD-10-CM

## 2011-02-03 DIAGNOSIS — D649 Anemia, unspecified: Secondary | ICD-10-CM

## 2011-02-03 DIAGNOSIS — R1013 Epigastric pain: Secondary | ICD-10-CM

## 2011-02-03 DIAGNOSIS — I10 Essential (primary) hypertension: Secondary | ICD-10-CM

## 2011-02-03 NOTE — Patient Instructions (Signed)
Nausea and vomiting and sickness: this is probably stomach irritation from the celebrex. Plan - stop celebrex           Increase nexium to 40mg  AM and PM for two weeks, then resume taking it once a day.           For pain it is ok to take the hydrocodone/APAP every 6 hours as needed.   Hip pain - great care in ambulating with such a bad hip. Need to give serious consideration to have total hip replacement. Hip surgery carries less risk than abdominal surgery. We, Dr. Eden Emms and I and all our team, are her for you whatever you decide.

## 2011-02-04 DIAGNOSIS — R1013 Epigastric pain: Secondary | ICD-10-CM | POA: Insufficient documentation

## 2011-02-04 NOTE — Assessment & Plan Note (Signed)
Patient with advanced OA right hip so that she is becoming immobile and is inconstant pain. Her orthopedic surgeon has recommended THR. Dr. Eden Emms feels her risk is 75% of adverse event due to her advanced heart disease. Had a long conversation with [name] and her son about the quality of her life and the risk of surgery. Of note, she did well with surgery for hernia repair in the last two years.  Plan - she will consider her options.  (greater than 50% of visit spent on education and counseling)

## 2011-02-04 NOTE — Assessment & Plan Note (Signed)
Lab Results  Component Value Date   HGB 9.9* 02/02/2011   This is stable for her with no significant drop since last study at 10.3

## 2011-02-04 NOTE — Progress Notes (Signed)
  Subjective:    Patient ID: DESI CARBY, female    DOB: [birth year], 77 y.o.   MRN: [id number]  HPI Mrs.Altland presents with a week long history of increased weakness,, abdominal discomfort and decreased appetite. She has no report of diarrhea, hematochezia, hematemesis, fever, chills, SOB, chest pain. She reports no appetite and early satiety. She has had her Hgb check last week and again this week and is holding steady with no significant drop.   PMH, FamHx and SocHx reviewed for any changes and relevance.    Review of Systems Review of Systems  Constitutional:  Negative for fever, chills, activity change and unexpected weight change.  HEENT:  Negative for hearing loss, ear pain, congestion, neck stiffness and postnasal drip. Negative for sore throat or swallowing problems. Negative for dental complaints.   Eyes: Negative for vision loss or change in visual acuity.  Respiratory: Negative for chest tightness and wheezing.   Cardiovascular: Negative for chest pain and palpitation. No decreased exercise tolerance Gastrointestinal: No change in bowel habit. No bloating or gas. No reflux or indigestion Genitourinary: Negative for urgency, frequency, flank pain and difficulty urinating.  Musculoskeletal: Negative for myalgias, back pain, arthralgias and gait problem.  Neurological: Negative for dizziness, tremors, weakness and headaches.  Hematological: Negative for adenopathy.  Psychiatric/Behavioral: Negative for behavioral problems and dysphoric mood.       Objective:   Physical Exam Vitals noted - stable Gen'l - elderly white woman in no acute distress HEENT- mild temporal wasting, C&S clear Pul- normal respirations Cor - RRR Abdomen - BS+ x 4, tender to palpation across the upper abdomen Neuro- non-focal MSK - very tender in the right hip. She cannot bear weight and needed 2 person assist to move from wc to table.        Assessment & Plan:

## 2011-02-04 NOTE — Assessment & Plan Note (Signed)
BP Readings from Last 3 Encounters:  02/03/11 158/60  11/04/10 152/50  10/30/10 100/42  Stable BP. Will not push more medication for tighter control for concern of hypotension.

## 2011-02-04 NOTE — Assessment & Plan Note (Signed)
Patient with decreased appetite and early satiety, weakness, epigastric tenderness on exam and h/o celebrex use strongly suggest acid related dyspepsia plus NSAID discomfort.  Plan -Increase  PPI therapy to bid for two weeks          Stop celebrex and all NSAIDs.          May use vicodin for pain

## 2011-02-10 ENCOUNTER — Encounter (HOSPITAL_BASED_OUTPATIENT_CLINIC_OR_DEPARTMENT_OTHER): Payer: Medicare Other | Admitting: Internal Medicine

## 2011-02-10 ENCOUNTER — Other Ambulatory Visit: Payer: Self-pay | Admitting: Internal Medicine

## 2011-02-10 DIAGNOSIS — D509 Iron deficiency anemia, unspecified: Secondary | ICD-10-CM

## 2011-02-10 DIAGNOSIS — D649 Anemia, unspecified: Secondary | ICD-10-CM

## 2011-02-10 DIAGNOSIS — I10 Essential (primary) hypertension: Secondary | ICD-10-CM

## 2011-02-10 DIAGNOSIS — D638 Anemia in other chronic diseases classified elsewhere: Secondary | ICD-10-CM

## 2011-02-10 LAB — IRON AND TIBC: %SAT: 7 % — ABNORMAL LOW (ref 20–55)

## 2011-02-10 LAB — CBC WITH DIFFERENTIAL/PLATELET
BASO%: 0.3 % (ref 0.0–2.0)
HCT: 33 % — ABNORMAL LOW (ref 34.8–46.6)
LYMPH%: 13.4 % — ABNORMAL LOW (ref 14.0–49.7)
MCH: 29.9 pg (ref 25.1–34.0)
MCHC: 33.1 g/dL (ref 31.5–36.0)
MONO#: 0.7 10*3/uL (ref 0.1–0.9)
NEUT%: 73.4 % (ref 38.4–76.8)
Platelets: 175 10*3/uL (ref 145–400)
WBC: 5.9 10*3/uL (ref 3.9–10.3)

## 2011-02-10 LAB — FERRITIN: Ferritin: 65 ng/mL (ref 10–291)

## 2011-02-13 ENCOUNTER — Other Ambulatory Visit: Payer: Self-pay | Admitting: Internal Medicine

## 2011-02-23 ENCOUNTER — Ambulatory Visit (INDEPENDENT_AMBULATORY_CARE_PROVIDER_SITE_OTHER): Payer: Medicare Other | Admitting: Cardiovascular Disease

## 2011-02-23 ENCOUNTER — Encounter: Payer: Self-pay | Admitting: Cardiovascular Disease

## 2011-02-23 DIAGNOSIS — I080 Rheumatic disorders of both mitral and aortic valves: Secondary | ICD-10-CM

## 2011-02-23 DIAGNOSIS — I2510 Atherosclerotic heart disease of native coronary artery without angina pectoris: Secondary | ICD-10-CM

## 2011-02-23 DIAGNOSIS — I10 Essential (primary) hypertension: Secondary | ICD-10-CM

## 2011-02-23 NOTE — Assessment & Plan Note (Signed)
Well controlled.  Continue current medications and low sodium Dash type diet.    

## 2011-02-23 NOTE — Assessment & Plan Note (Signed)
No change in murmur.  Euvolemic Continue current medical Rx

## 2011-02-23 NOTE — Patient Instructions (Signed)
Your physician recommends that you schedule a follow-up appointment in: AS NEEDED  Your physician recommends that you continue on your current medications as directed. Please refer to the Current Medication list given to you today.  

## 2011-02-23 NOTE — Assessment & Plan Note (Signed)
Stable no angina. Moder risk hip surgery in octogenarian with severe unrevascularizable 3VD and severe MR with atrial arrhythmia.  Not cleared to have surgery either spinal or general

## 2011-02-23 NOTE — Progress Notes (Signed)
Unfortunately [name] is 77 years old has severe three-vessel disease, severe MR and a history of congestive heart failure. She has afib and is not on coumadin due to age and frailty.. She has not had palpitations or TIA like symptoms. She had urgent surgery for an incarcerated hernia and surprisingly did well with no cardiac complications. She has had a right hip replacement but still has a bad left hip. She has a chronic multifactorial anemia that has recquired iv iron and transfucstions. She has had an elevated BNP with prerenal azotemia and is currently on Lasix with supplemental zaroxyln 3x/week. Dr Arthur Holms is doing a good job balancing her volume status with fatiigue and pre-renal azotemia. She is not a candidate for aggressive care or intervention.  We have been contacted multiple times by Dr Rogue Jury office regarding surgical clearance.  We have indicated multiple times that we cannot clear her for surgery.  Given her age, unrevascularized 3 VD and severe MR she is at extremely high risk for periop complications.  Spinal anesthesia would not change this.    The patient is able to tell Dr Valentina Gu that she is willing to take the risk and proceed but we cannot clear her.  I personally called Dr Rogue Jury office and he was in the OR and unavailable to speak.  Filled out preop form and sent to his office.  ROS: Denies fever, malais, weight loss, blurry vision, decreased visual acuity, cough, sputum, SOB, hemoptysis, pleuritic pain, palpitaitons, heartburn, abdominal pain, melena, lower extremity edema, claudication, or rash.  All other systems reviewed and negative  General: Affect appropriate Elderly Frail and Chronically ill in wheelchair HEENT: normal Neck supple with no adenopathy JVP normal no bruits no thyromegaly Lungs clear with no wheezing and good diaphragmatic motion Heart:  S1/S2 MR  murmur,rub, gallop or click PMI normal Abdomen: benighn, BS positve, no tenderness, no AAA no bruit.  No HSM  or HJR Distal pulses intact with no bruits No edema Neuro non-focal Skin warm and dry No muscular weakness   Current Outpatient Prescriptions  Medication Sig Dispense Refill  . AVAPRO 300 MG tablet TAKE ONE TABLET BY MOUTH EVERY DAY  90 each  3  . clonazePAM (KLONOPIN) 0.5 MG tablet Take 0.5 mg by mouth 2 (two) times daily as needed.        . doxazosin (CARDURA) 4 MG tablet TAKE ONE TABLET BY MOUTH AT BEDTIME FOR BLOOD PRESSURE  30 tablet  6  . esomeprazole (NEXIUM) 40 MG capsule Take 40 mg by mouth every morning.        . Fe Fum-FePoly-FA-Vit C-Vit B3 (INTEGRA F) 125-1 MG CAPS Take 1 capsule by mouth daily.        . fexofenadine (ALLEGRA) 180 MG tablet Take 180 mg by mouth as needed.        . furosemide (LASIX) 40 MG tablet Take 40 mg by mouth 2 (two) times daily.        Marland Kitchen HYDROcodone-acetaminophen (NORCO) 5-325 MG per tablet TAKE ONE TABLET BY MOUTH EVERY 6 HOURS AS NEEDED  90 tablet  6  . LANOXIN 0.125 MG tablet TAKE ONE TABLET BY MOUTH EVERY DAY  30 each  6  . metFORMIN (GLUCOPHAGE) 500 MG tablet TAKE ONE TABLET BY MOUTH EVERY DAY  30 tablet  4  . metolazone (ZAROXOLYN) 2.5 MG tablet Take 2.5 mg by mouth as directed. M-W-F 30 minutes before AM furosemide       . metoprolol (LOPRESSOR) 50 MG tablet Take 50  mg by mouth 2 (two) times daily.        . potassium chloride SA (K-DUR,KLOR-CON) 20 MEQ tablet Take 1 tablet (20 mEq total) by mouth 2 (two) times daily.  60 tablet  5  . simvastatin (ZOCOR) 20 MG tablet TAKE ONE TABLET BY MOUTH EVERY DAY  90 tablet  2  . temazepam (RESTORIL) 15 MG capsule Take 1 capsule (15 mg total) by mouth at bedtime as needed.  30 capsule  6  . traZODone (DESYREL) 50 MG tablet TAKE ONE TO TWO TABLETS BY MOUTH AT BEDTIME AS NEEDED  30 tablet  2    Allergies  Codeine  Electrocardiogram:  Assessment and Plan

## 2011-02-24 ENCOUNTER — Other Ambulatory Visit: Payer: Self-pay | Admitting: Internal Medicine

## 2011-02-25 ENCOUNTER — Telehealth: Payer: Self-pay | Admitting: *Deleted

## 2011-02-25 NOTE — Telephone Encounter (Signed)
Pt's son Tammy Sours called regarding pt.. Pt has stubbed toe and her nail is pushed back. Pt wants to know if she can be seen in office to have toenail removed or if she needs a referral for a podiatrist. Pt also complains of constant nausea and is requesting a rx for nausea medication-MEN pt, please advise

## 2011-02-26 NOTE — Telephone Encounter (Signed)
Pt was seen by a podiatrist for her toe. Pt's son informed of MD's advisement regarding nausea and will callback to schedule an appointment with Dr. Debby Bud.

## 2011-02-26 NOTE — Telephone Encounter (Signed)
It is hard to tell w/o seeing. We can ref to Podiatry. Re: nausea - hold Simvastatin. OV w/Dr Norins Thx

## 2011-02-27 NOTE — Telephone Encounter (Signed)
okfor refill x 5

## 2011-02-28 NOTE — Telephone Encounter (Signed)
Ok for refill x 5 

## 2011-03-02 ENCOUNTER — Encounter (HOSPITAL_BASED_OUTPATIENT_CLINIC_OR_DEPARTMENT_OTHER): Payer: Medicare Other | Admitting: Internal Medicine

## 2011-03-02 ENCOUNTER — Other Ambulatory Visit: Payer: Self-pay | Admitting: Internal Medicine

## 2011-03-02 DIAGNOSIS — I10 Essential (primary) hypertension: Secondary | ICD-10-CM

## 2011-03-02 DIAGNOSIS — D509 Iron deficiency anemia, unspecified: Secondary | ICD-10-CM

## 2011-03-02 DIAGNOSIS — D649 Anemia, unspecified: Secondary | ICD-10-CM

## 2011-03-02 DIAGNOSIS — D638 Anemia in other chronic diseases classified elsewhere: Secondary | ICD-10-CM

## 2011-03-02 LAB — CBC WITH DIFFERENTIAL/PLATELET
Basophils Absolute: 0 10*3/uL (ref 0.0–0.1)
Eosinophils Absolute: 0 10*3/uL (ref 0.0–0.5)
HGB: 10.2 g/dL — ABNORMAL LOW (ref 11.6–15.9)
MCV: 87.2 fL (ref 79.5–101.0)
NEUT#: 4.9 10*3/uL (ref 1.5–6.5)
RDW: 16.1 % — ABNORMAL HIGH (ref 11.2–14.5)
lymph#: 0.8 10*3/uL — ABNORMAL LOW (ref 0.9–3.3)

## 2011-03-03 NOTE — Telephone Encounter (Signed)
Per [facility] pharmacy, they did Not receive the "phoe in" request om 01/01/2011.? Gave verbal authorization.

## 2011-03-06 ENCOUNTER — Ambulatory Visit: Payer: Medicare Other | Admitting: Internal Medicine

## 2011-03-09 ENCOUNTER — Telehealth: Payer: Self-pay

## 2011-03-09 NOTE — Telephone Encounter (Signed)
[facility] Triage Call Report Triage Record Num: [id number] Operator: Frederico Hamman Patient Name: [name] Call Date & Time: 03/07/2011 1:43:34PM Patient Phone: [phone] PCP: Illene Regulus Patient Gender: Female PCP Fax : [phone] Patient DOB: 24-Dec-1919 Practice Name: [facility] Reason for Call: Tammy Sours, Other, calling regarding Confusion. PCP is Norins, Michael. Callback number is [phone]. Greg/Son reports Asiah has had short term memory x 7 - 10 days. C/o earache. Confused. Is not currently with [facility]. Eliya is alone. Daughter is on way to see Kinslee. Advised to callback when with Scarlette Calico for traige. Tammy Sours states he can answer questions. Reports sudden mental status change on 7/28. Advised to call 911. Protocol(s) Used: Headache Recommended Outcome per Protocol: Activate EMS 911 Reason for Outcome: Sudden change in mental status or new change in speech or vision Care Advice: ~ 03/07/2011 1:50:56PM Page 1 of 1 [email or web address]

## 2011-03-11 ENCOUNTER — Encounter (HOSPITAL_BASED_OUTPATIENT_CLINIC_OR_DEPARTMENT_OTHER): Payer: Medicare Other | Admitting: Internal Medicine

## 2011-03-11 DIAGNOSIS — D638 Anemia in other chronic diseases classified elsewhere: Secondary | ICD-10-CM

## 2011-03-11 DIAGNOSIS — D509 Iron deficiency anemia, unspecified: Secondary | ICD-10-CM

## 2011-03-17 ENCOUNTER — Telehealth: Payer: Self-pay | Admitting: *Deleted

## 2011-03-17 NOTE — Telephone Encounter (Signed)
Spoke w/son - they are worried about acute change in short term memory. He spoke w/patient this am and this pm pt does not remember speaking to her son. Symptoms x approx 1 wk. Scheduled for OV tomorrow am.

## 2011-03-18 ENCOUNTER — Ambulatory Visit (INDEPENDENT_AMBULATORY_CARE_PROVIDER_SITE_OTHER): Payer: Medicare Other | Admitting: Internal Medicine

## 2011-03-18 ENCOUNTER — Other Ambulatory Visit: Payer: Self-pay | Admitting: Internal Medicine

## 2011-03-18 VITALS — BP 128/62 | HR 61 | Temp 98.0°F

## 2011-03-18 DIAGNOSIS — R4182 Altered mental status, unspecified: Secondary | ICD-10-CM

## 2011-03-18 NOTE — Progress Notes (Signed)
  Subjective:    Patient ID: [name], female    DOB: December 06, 1919, 77 y.o.   MRN: [id number]  HPI For the past 3 weeks [name] has not been herself - with a change in affect, more confusion, memory loss. She had a feeling of fullness in her head. She was seen at Urgent care for ear irrigation which did not help the fullness feeling. She does take hydrocodone for hip pain. She does look pale and haggard, thinner and appears weak.  They have had a second opinion on hip surgery - Dr. Eden Emms feels she has a 70% risk of operative or perioperative mortality.   She has been having iron infusions [facility], the most recent being 2 weeks ago and the hemoglobin has been good. There has been no signs of bleeding.  I have reviewed the patient's medical history in detail and updated the computerized patient record.    Review of Systems System review is negative for any constitutional, cardiac, pulmonary, GI or neuro symptoms or complaints     Objective:   Physical Exam Vitals noted to be stable Gen'l- elderly white woman who is looking haggard. She does not have on her usual make-up. HEENT - C&S clear - PERRLA, EACs clear, TMs without erythema, landmarks poorly visualized. Chest - CTAP Cor 2+ radial pulse, RRR Abdomen - BS+, soft, no guarding Ext - 1 + pedal edema.    Lab Results  Component Value Date   LDLCALC  Value: 39        Total Cholesterol/HDL:CHD Risk Coronary Heart Disease Risk Table                     Men   Women  1/2 Average Risk   3.4   3.3  Average Risk       5.0   4.4  2 X Average Risk   9.6   7.1  3 X Average Risk  23.4   11.0        Use the calculated Patient Ratio above and the CHD Risk Table to determine the patient's CHD Risk.        ATP III CLASSIFICATION (LDL):  <100     mg/dL   Optimal  409-811  mg/dL   Near or Above                    Optimal  130-159  mg/dL   Borderline  914-782  mg/dL   High  >956     mg/dL   Very High 09/23/863            Assessment & Plan:  Mental status changes - her labs are reviewed and seem stable. Her Hgb was reasonable (s/p iron infusion x 2). No evidence of any acute medical issues or changes. Suspect the mental status change and change in level of activity is multi-factorial including the regular use of narcotics for pain.  Plan - try to minimize narcotic use, although she does have significant pain.           Watch for any acute change - that will require repeat evaluation.

## 2011-04-08 ENCOUNTER — Other Ambulatory Visit: Payer: Self-pay | Admitting: Internal Medicine

## 2011-05-04 LAB — TYPE AND SCREEN

## 2011-05-04 LAB — CARDIAC PANEL(CRET KIN+CKTOT+MB+TROPI)
CK, MB: 12.5 — ABNORMAL HIGH
Relative Index: 8.8 — ABNORMAL HIGH
Total CK: 142
Total CK: 144
Troponin I: 2.68

## 2011-05-04 LAB — CBC
HCT: 16.2 — ABNORMAL LOW
HCT: 28.4 — ABNORMAL LOW
Hemoglobin: 5.5 — CL
Hemoglobin: 9.6 — ABNORMAL LOW
MCHC: 33.7
MCHC: 33.7
MCV: 86.9
RBC: 1.81 — ABNORMAL LOW
RBC: 3.26 — ABNORMAL LOW

## 2011-05-04 LAB — POCT CARDIAC MARKERS
CKMB, poc: 1.2
Myoglobin, poc: 115
Operator id: 277751
Troponin i, poc: 0.29 — ABNORMAL HIGH

## 2011-05-04 LAB — DIFFERENTIAL
Lymphocytes Relative: 9 — ABNORMAL LOW
Monocytes Absolute: 0.7
Monocytes Relative: 8
Neutro Abs: 6.6

## 2011-05-04 LAB — POCT I-STAT, CHEM 8
BUN: 22
Chloride: 101
Sodium: 134 — ABNORMAL LOW

## 2011-05-04 LAB — HEMOGLOBIN AND HEMATOCRIT, BLOOD: HCT: 28.5 — ABNORMAL LOW

## 2011-05-04 LAB — APTT: aPTT: 42 — ABNORMAL HIGH

## 2011-05-05 LAB — CBC
HCT: 25.4 — ABNORMAL LOW
HCT: 25.7 — ABNORMAL LOW
HCT: 25.8 — ABNORMAL LOW
HCT: 26 — ABNORMAL LOW
HCT: 26.7 — ABNORMAL LOW
HCT: 26.9 — ABNORMAL LOW
HCT: 27 — ABNORMAL LOW
HCT: 27.7 — ABNORMAL LOW
HCT: 27.8 — ABNORMAL LOW
Hemoglobin: 8.5 — ABNORMAL LOW
Hemoglobin: 8.5 — ABNORMAL LOW
Hemoglobin: 8.5 — ABNORMAL LOW
Hemoglobin: 8.5 — ABNORMAL LOW
Hemoglobin: 8.7 — ABNORMAL LOW
Hemoglobin: 8.8 — ABNORMAL LOW
Hemoglobin: 8.9 — ABNORMAL LOW
Hemoglobin: 9 — ABNORMAL LOW
Hemoglobin: 9.3 — ABNORMAL LOW
MCHC: 31.8
MCHC: 32.1
MCHC: 32.4
MCHC: 32.5
MCHC: 32.5
MCHC: 32.5
MCHC: 32.7
MCHC: 32.8
MCHC: 33
MCHC: 33.2
MCHC: 33.6
MCV: 85
MCV: 85.9
MCV: 86.3
MCV: 86.6
MCV: 86.9
MCV: 86.9
MCV: 87.1
MCV: 87.6
MCV: 88.3
Platelets: 178
Platelets: 180
Platelets: 196
Platelets: 229
Platelets: 229
Platelets: 252
Platelets: 255
Platelets: 310
RBC: 2.95 — ABNORMAL LOW
RBC: 3.13 — ABNORMAL LOW
RBC: 3.16 — ABNORMAL LOW
RBC: 3.19 — ABNORMAL LOW
RBC: 3.27 — ABNORMAL LOW
RDW: 17.9 — ABNORMAL HIGH
RDW: 18.4 — ABNORMAL HIGH
RDW: 18.4 — ABNORMAL HIGH
RDW: 18.5 — ABNORMAL HIGH
RDW: 18.5 — ABNORMAL HIGH
RDW: 18.5 — ABNORMAL HIGH
RDW: 18.9 — ABNORMAL HIGH
RDW: 19 — ABNORMAL HIGH
RDW: 19 — ABNORMAL HIGH
WBC: 6.2
WBC: 7.6

## 2011-05-05 LAB — B-NATRIURETIC PEPTIDE (CONVERTED LAB): Pro B Natriuretic peptide (BNP): 652 — ABNORMAL HIGH

## 2011-05-05 LAB — PROTIME-INR
INR: 1.3
Prothrombin Time: 16.1 — ABNORMAL HIGH
Prothrombin Time: 25.6 — ABNORMAL HIGH
Prothrombin Time: 30.7 — ABNORMAL HIGH

## 2011-05-05 LAB — BASIC METABOLIC PANEL
BUN: 10
BUN: 11
BUN: 15
CO2: 23
CO2: 24
CO2: 30
Calcium: 9
Chloride: 101
Chloride: 103
Chloride: 99
Chloride: 99
Creatinine, Ser: 0.55
GFR calc Af Amer: >60
GFR calc non Af Amer: >60
Glucose, Bld: 117 — ABNORMAL HIGH
Glucose, Bld: 119 — ABNORMAL HIGH
Glucose, Bld: 126 — ABNORMAL HIGH
Potassium: 4
Potassium: 4.4
Potassium: 5.3 — ABNORMAL HIGH
Sodium: 135
Sodium: 136

## 2011-05-05 LAB — COMPREHENSIVE METABOLIC PANEL
Albumin: 2.6 — ABNORMAL LOW
BUN: 13
Chloride: 96
Creatinine, Ser: 0.64
GFR calc non Af Amer: >60
Total Bilirubin: 2 — ABNORMAL HIGH

## 2011-05-05 LAB — DIFFERENTIAL
Basophils Relative: 0
Eosinophils Absolute: 0
Eosinophils Relative: 1
Lymphs Abs: 0.8
Monocytes Relative: 10

## 2011-05-05 LAB — POCT I-STAT 3, ART BLOOD GAS (G3+)
Acid-Base Excess: 8 — ABNORMAL HIGH
O2 Saturation: 95
TCO2: 34
pO2, Arterial: 72 — ABNORMAL LOW

## 2011-05-05 LAB — HEPARIN LEVEL (UNFRACTIONATED)
Heparin Unfractionated: 0.15 — ABNORMAL LOW
Heparin Unfractionated: 0.22 — ABNORMAL LOW
Heparin Unfractionated: 0.45
Heparin Unfractionated: 0.73 — ABNORMAL HIGH
Heparin Unfractionated: 0.88 — ABNORMAL HIGH
Heparin Unfractionated: <0.1 — ABNORMAL LOW

## 2011-05-05 LAB — BILIRUBIN, FRACTIONATED(TOT/DIR/INDIR): Total Bilirubin: 2 — ABNORMAL HIGH

## 2011-05-05 LAB — HEMOGLOBIN AND HEMATOCRIT, BLOOD
HCT: 26.4 — ABNORMAL LOW
HCT: 26.5 — ABNORMAL LOW
Hemoglobin: 8.8 — ABNORMAL LOW
Hemoglobin: 9.1 — ABNORMAL LOW

## 2011-05-05 LAB — CULTURE, BLOOD (ROUTINE X 2): Culture: NO GROWTH

## 2011-05-05 LAB — HAPTOGLOBIN: Haptoglobin: 256 — ABNORMAL HIGH

## 2011-05-05 LAB — POCT I-STAT 3, VENOUS BLOOD GAS (G3P V)
Bicarbonate: 30 — ABNORMAL HIGH
O2 Saturation: 58
TCO2: 31

## 2011-05-05 LAB — LIPID PANEL
Cholesterol: 104
LDL Cholesterol: 63
Total CHOL/HDL Ratio: 3.7
Triglycerides: 67
VLDL: 13

## 2011-05-05 LAB — POCT I-STAT, CHEM 8
Glucose, Bld: 168 — ABNORMAL HIGH
HCT: 28 — ABNORMAL LOW
Hemoglobin: 9.5 — ABNORMAL LOW
Potassium: 3.2 — ABNORMAL LOW
Sodium: 137
TCO2: 31

## 2011-05-05 LAB — RETICULOCYTES
RBC.: 2.97 — ABNORMAL LOW
Retic Count, Absolute: 83.2

## 2011-05-05 LAB — IRON AND TIBC: Iron: <10 — ABNORMAL LOW

## 2011-05-05 LAB — VITAMIN B12: Vitamin B-12: 1045 — ABNORMAL HIGH (ref 211–911)

## 2011-05-05 LAB — FERRITIN: Ferritin: 93 (ref 10–291)

## 2011-05-25 ENCOUNTER — Encounter (HOSPITAL_BASED_OUTPATIENT_CLINIC_OR_DEPARTMENT_OTHER): Payer: Medicare Other | Admitting: Internal Medicine

## 2011-05-25 ENCOUNTER — Other Ambulatory Visit: Payer: Self-pay | Admitting: Internal Medicine

## 2011-05-25 DIAGNOSIS — D649 Anemia, unspecified: Secondary | ICD-10-CM

## 2011-05-25 DIAGNOSIS — D509 Iron deficiency anemia, unspecified: Secondary | ICD-10-CM

## 2011-05-25 DIAGNOSIS — D638 Anemia in other chronic diseases classified elsewhere: Secondary | ICD-10-CM

## 2011-05-25 DIAGNOSIS — I10 Essential (primary) hypertension: Secondary | ICD-10-CM

## 2011-05-25 LAB — CBC WITH DIFFERENTIAL/PLATELET
Basophils Absolute: 0 10*3/uL (ref 0.0–0.1)
Eosinophils Absolute: 0.1 10*3/uL (ref 0.0–0.5)
HCT: 26.4 % — ABNORMAL LOW (ref 34.8–46.6)
LYMPH%: 12 % — ABNORMAL LOW (ref 14.0–49.7)
MONO#: 0.4 10*3/uL (ref 0.1–0.9)
NEUT#: 3.1 10*3/uL (ref 1.5–6.5)
NEUT%: 75.1 % (ref 38.4–76.8)
Platelets: 145 10*3/uL (ref 145–400)
WBC: 4.1 10*3/uL (ref 3.9–10.3)

## 2011-05-25 LAB — FERRITIN: Ferritin: 33 ng/mL (ref 10–291)

## 2011-05-25 LAB — IRON AND TIBC
%SAT: 5 % — ABNORMAL LOW (ref 20–55)
Iron: 22 ug/dL — ABNORMAL LOW (ref 42–145)

## 2011-05-27 ENCOUNTER — Inpatient Hospital Stay (HOSPITAL_COMMUNITY)
Admission: EM | Admit: 2011-05-27 | Discharge: 2011-05-29 | DRG: 812 | Disposition: A | Discharge status: Home Health | Payer: Medicare Other | Attending: Internal Medicine | Admitting: Internal Medicine

## 2011-05-27 DIAGNOSIS — E876 Hypokalemia: Secondary | ICD-10-CM | POA: Diagnosis present

## 2011-05-27 DIAGNOSIS — I10 Essential (primary) hypertension: Secondary | ICD-10-CM | POA: Diagnosis present

## 2011-05-27 DIAGNOSIS — D62 Acute posthemorrhagic anemia: Principal | ICD-10-CM | POA: Diagnosis present

## 2011-05-27 DIAGNOSIS — D500 Iron deficiency anemia secondary to blood loss (chronic): Secondary | ICD-10-CM | POA: Diagnosis present

## 2011-05-27 DIAGNOSIS — E119 Type 2 diabetes mellitus without complications: Secondary | ICD-10-CM | POA: Diagnosis present

## 2011-05-27 LAB — BASIC METABOLIC PANEL
GFR calc non Af Amer: 72 mL/min — ABNORMAL LOW (ref >90)
Glucose, Bld: 190 mg/dL — ABNORMAL HIGH (ref 70–99)
Potassium: 2.8 mEq/L — ABNORMAL LOW (ref 3.5–5.1)
Sodium: 139 mEq/L (ref 135–145)

## 2011-05-27 LAB — CBC
HCT: 23.7 % — ABNORMAL LOW (ref 36.0–46.0)
Hemoglobin: 7.2 g/dL — ABNORMAL LOW (ref 12.0–15.0)
Hemoglobin: 7.5 g/dL — ABNORMAL LOW (ref 12.0–15.0)
MCH: 29.4 pg (ref 26.0–34.0)
MCHC: 30.4 g/dL (ref 30.0–36.0)
MCV: 92.9 fL (ref 78.0–100.0)
RBC: 2.52 MIL/uL — ABNORMAL LOW (ref 3.87–5.11)
RBC: 2.55 MIL/uL — ABNORMAL LOW (ref 3.87–5.11)
WBC: 5.4 10*3/uL (ref 4.0–10.5)

## 2011-05-27 LAB — URINALYSIS, ROUTINE W REFLEX MICROSCOPIC
Glucose, UA: NEGATIVE mg/dL
Leukocytes, UA: NEGATIVE
Protein, ur: NEGATIVE mg/dL
Urobilinogen, UA: 0.2 mg/dL (ref 0.0–1.0)

## 2011-05-28 ENCOUNTER — Other Ambulatory Visit: Payer: Self-pay | Admitting: Internal Medicine

## 2011-05-28 DIAGNOSIS — E876 Hypokalemia: Secondary | ICD-10-CM

## 2011-05-28 DIAGNOSIS — M1990 Unspecified osteoarthritis, unspecified site: Secondary | ICD-10-CM

## 2011-05-28 DIAGNOSIS — D649 Anemia, unspecified: Secondary | ICD-10-CM

## 2011-05-28 LAB — BASIC METABOLIC PANEL
BUN: 10
BUN: 7
CO2: 31
CO2: 32
CO2: 32
Calcium: 8.4
Chloride: 96
Chloride: 96
Chloride: 96
Creatinine, Ser: 0.63
GFR calc Af Amer: >60
GFR calc Af Amer: >60
GFR calc Af Amer: >60
GFR calc non Af Amer: >60
GFR calc non Af Amer: >60
Glucose, Bld: 158 — ABNORMAL HIGH
Glucose, Bld: 195 — ABNORMAL HIGH
Glucose, Bld: 206 — ABNORMAL HIGH
Potassium: 2.7 — CL
Potassium: 2.8 — ABNORMAL LOW
Potassium: 3 — ABNORMAL LOW
Potassium: 3.6
Potassium: 3.9
Sodium: 133 — ABNORMAL LOW
Sodium: 135
Sodium: 136
Sodium: 136
Sodium: 136

## 2011-05-28 LAB — URINALYSIS, ROUTINE W REFLEX MICROSCOPIC
Glucose, UA: NEGATIVE
Leukocytes, UA: NEGATIVE
Nitrite: NEGATIVE
pH: 6.5

## 2011-05-28 LAB — DIFFERENTIAL
Basophils Absolute: 0
Eosinophils Relative: 2
Lymphocytes Relative: 27
Monocytes Absolute: 0.6
Monocytes Relative: 10

## 2011-05-28 LAB — GLUCOSE, CAPILLARY
Glucose-Capillary: 154 mg/dL — ABNORMAL HIGH (ref 70–99)
Glucose-Capillary: 160 mg/dL — ABNORMAL HIGH (ref 70–99)

## 2011-05-28 LAB — CBC
HCT: 25.8 — ABNORMAL LOW
HCT: 25.9 — ABNORMAL LOW
HCT: 28.8 — ABNORMAL LOW
HCT: 29.3 — ABNORMAL LOW
Hemoglobin: 8.8 — ABNORMAL LOW
Hemoglobin: 8.9 — ABNORMAL LOW
Hemoglobin: 9.6 — ABNORMAL LOW
Hemoglobin: 9.7 — ABNORMAL LOW
MCHC: 33.2
MCHC: 33.3
MCV: 86.4
MCV: 87.9
MCV: 87.9
MCV: 88.7
Platelets: 124 — ABNORMAL LOW
Platelets: 168
Platelets: 207
RBC: 2.96 — ABNORMAL LOW
RBC: 3.28 — ABNORMAL LOW
RDW: 14.7 — ABNORMAL HIGH
RDW: 14.9 — ABNORMAL HIGH
RDW: 15 — ABNORMAL HIGH
RDW: 15.4 — ABNORMAL HIGH
WBC: 6
WBC: 7.2
WBC: 7.7

## 2011-05-28 LAB — COMPREHENSIVE METABOLIC PANEL
AST: 33
Albumin: 4.2
Chloride: 100
Creatinine, Ser: 0.66
GFR calc Af Amer: >60
Potassium: 3.6
Total Bilirubin: 1.1
Total Protein: 7.7

## 2011-05-28 LAB — HEMOGLOBIN AND HEMATOCRIT, BLOOD
HCT: 27.2 % — ABNORMAL LOW (ref 36.0–46.0)
Hemoglobin: 8.6 g/dL — ABNORMAL LOW (ref 12.0–15.0)

## 2011-05-28 LAB — CROSSMATCH
ABO/RH(D): A POS
Antibody Screen: NEGATIVE

## 2011-05-28 LAB — HEMOGLOBIN A1C: Hgb A1c MFr Bld: 6.4 — ABNORMAL HIGH

## 2011-05-28 LAB — URINE CULTURE
Colony Count: NO GROWTH
Culture: NO GROWTH

## 2011-05-28 LAB — PROTIME-INR
INR: 1.1
Prothrombin Time: 13.8
Prothrombin Time: 18.6 — ABNORMAL HIGH

## 2011-05-28 LAB — POTASSIUM: Potassium: 4.3 mEq/L (ref 3.5–5.1)

## 2011-05-28 LAB — APTT: aPTT: 58 — ABNORMAL HIGH

## 2011-05-28 LAB — PREPARE RBC (CROSSMATCH)

## 2011-05-29 LAB — CROSSMATCH
ABO/RH(D): A POS
Donor AG Type: NEGATIVE

## 2011-06-01 LAB — GLUCOSE, CAPILLARY: Glucose-Capillary: 180 mg/dL — ABNORMAL HIGH (ref 70–99)

## 2011-06-01 NOTE — Discharge Summary (Signed)
[name], RENDA NO.:  [id number]  MEDICAL RECORD NO.:  [id number]  LOCATION:  1315                         FACILITY:  [facility]  PHYSICIAN:  Rosalyn Gess. Norins, MD  DATE OF BIRTH:  [birth year]  DATE OF ADMISSION:  05/27/2011 DATE OF DISCHARGE:  05/28/2011                              DISCHARGE SUMMARY   ADMITTING DIAGNOSES: 1. Acute-on-chronic anemia. 2. Hypokalemia.  DISCHARGE DIAGNOSES: 1. Acute-on-chronic anemia. 2. Hypokalemia.  CONSULTANTS:  None.  PROCEDURES:  None.  HISTORY OF PRESENT ILLNESS:  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 The patient has been followed by Dr. Arbutus Ped.  She has been in her usual state of health with a hemoglobin about at her baseline of 8-9 g.  The patient was to have IV infusion of iron 1 week after her visit.  On the morning of admission, the patient noted bleeding from her foot. No obvious source of bleeding was noted, but a pressure bandage was applied.  Because of the blood loss, she was brought to the emergency department for evaluation.  At that time, her white count was normal, hemoglobin was 7.2 g.  The patient was admitted for transfusion as well as for IV iron.  Please see the H and P for past medical history, family history, social history, and admission exam.  In addition, see epic office notes.  HOSPITAL COURSE:  The patient was admitted to the hospital.  She was transfused 1 unit of blood.  Her hemoglobin came up to 7.5 g.  She had been infused with IV iron on the 17th.  Because her hemoglobin was still slightly low, the patient was typed, crossmatched, and transfused a 2nd unit of blood.  Her hemoglobin following transfusion of 2nd unit was 8.6 g, which was closer to her baseline.  She has done well during this admission with no complaints of chest pain, no shortness of breath, or other problems.  With successful transfusion,  with hemoglobin in her baseline range, with IV iron infusion completed, she is now stable and ready for discharge home.  The patient did have significant hypokalemia at the time of admission with a potassium of 2 .8.  She was given oral replacement.  Potassium at the time of discharge dictation from today's date at 1548 hours was 4.3 and in normal range.  The patient will continue on home oral potassium.  DISCHARGE EXAMINATION:  VITAL SIGNS:  Temperature was 98, blood pressure 144/66, pulse 61, respirations were 15, O2 sats 95% on room air. GENERAL APPEARANCE:  This is an 77 year old woman who looks her stated age, sitting on the side of the bed, finished her dinner, and she is in no distress.  The patient has no increased work of breathing. HEART:  Her heart rate is regular.  No further examination conducted.  DISPOSITION:  The patient is discharged to home.  We will arrange for the patient to have home physical therapy.  She has chronic hip pain, is not a candidate for surgery and is losing mobility.  Plan is forphysical therapy evaluation and treatment to maintain her independence and mobility at home.  FINAL LABORATORY  DATA:  Potassium of 4.3, hemoglobin of 8.6 with a hematocrit of 27.2.  Digoxin level this admission was slightly low at 0.7, but given her advanced age, would not increase her dosing at this time.  The patient's condition at the time of discharge dictation is stable, but guarded, given her advanced age and multiple medical problems.     Rosalyn Gess Norins, MD     MEN/MEDQ  D:  05/28/2011  T:  05/29/2011  Job:  [id number]  cc:   Lajuana Matte, M.D.  Electronically Signed by Illene Regulus MD on 06/01/2011 04:23:03 AM

## 2011-06-02 NOTE — H&P (Signed)
[name], [name] NO.:  [id number]  MEDICAL RECORD NO.:  [id number]  LOCATION:  WLED                         FACILITY:  [facility]  PHYSICIAN:  Hollice Espy, M.D.DATE OF BIRTH:  [birth year]  DATE OF ADMISSION:  05/27/2011 DATE OF DISCHARGE:                             HISTORY & PHYSICAL   ATTENDING PHYSICIAN AND PRIMARY CARE PHYSICIAN:  Rosalyn Gess. Norins, MD.  CHIEF COMPLAINT:  Bleeding from foot.  HISTORY OF PRESENT ILLNESS:  The patient is an 77 year old white female with past history of chronic AVM malformation since her multiple AVM malformations that has led to a chronic blood loss anemia.  The patient has been hospitalized a number of times for iron transfusions and blood transfusions.  She was in her usual state of health and went to the [facility] to see Dr. Gwenyth Bouillon, her hematologist and was told at that time, that her hemoglobin was 8.2.  She was sent home with plans for an iron infusion in 1 weeks' time.  She woke up this morning and noted significant bleeding from her foot.  She was not sure where the cause of the bleeding was from, but her daughter noted that had some sort of spray formation.  Pressure was applied, but because of the patient's history of low blood, she was brought into the emergency room.  In the emergency room, she was noted to have a BUN of 38 with a normal creatinine.  Other labs of note were a white hemoglobin of 7.2 with an MCV of 94.  With these findings, it was felt best that the patient come in for observation and receive a blood transfusion.  By the time the patient arrived to the emergency room, her foot has stopped bleeding. When I saw the patient, she was doing okay.  She denied any headaches, vision changes, dysphagia, chest pain, palpitations, shortness of breath, wheeze, cough, abdominal pain, hematuria, dysuria, constipation, diarrhea, focal extremity numbness, weakness, or pain other than some small  tenderness at the base of her left foot.  REVIEW OF SYSTEMS:  Otherwise negative.  PAST MEDICAL HISTORY:  Includes diabetes mellitus which is diet controlled; hypertension; chronic AVM that led to chronic blood loss anemia with multiple transfusions in the past.  MEDICATIONS:  The patient is on, 1. Clonazepam 0.5 p.o. b.i.d. p.r.n. for anxiety. 2. Avapro 300 p.o. daily. 3. Digoxin 0.125 p.o. daily. 4. Doxazosin 4 mg p.o. at bedtime. 5. Fexofenadine 180 p.o. daily. 6. Lasix 40 p.o. b.i.d. 7. Integra F 125-1 p.o. daily. 8. Midazolam 2.5 on Mondays, Wednesdays, and Fridays. 9. Metoprolol 50 p.o. b.i.d. 10.K-Dur 20 mEq p.o. daily. 11.Zocor 20 mg p.o. daily. 12.Restoril 15 mg p.o. at bedtime. 13.Metformin 500 p.o. daily.  ALLERGIES:  She has no known drug allergies.  SOCIAL HISTORY:  She denies any tobacco, alcohol, or drug use.  FAMILY HISTORY:  Noted for hypertension in her children.  PHYSICAL EXAMINATION:  VITAL SIGNS ON ADMISSION:  Temp 98.6; heart rate 99, now down to 83; blood pressure 138/68; respirations 28; and O2 saturation 99% on room air. GENERAL:  She is alert and oriented x3, in no apparent distress. HEENT:  Normocephalic, atraumatic.  Mucous membranes are  moist.  She has no carotid bruits. HEART:  Irregular rhythm.  Rate controlled. LUNGS:  Clear to auscultation bilaterally.  ABDOMEN:  Soft, nontender. Nondistended hypoactive active bowel sounds. EXTREMITIES:  Show no clubbing, cyanosis, or edema.  She has on her left foot a partially removed first toenail and she has a small scab which is dried and healed with no active bleeding on the base of her left foot. On the plantar aspect of her left foot, approximately 1/3 down from the toes lab work digoxin level was slightly subtherapeutic at 0.7, sodium 139, potassium 2.8, chloride 99, bicarb 30, BUN 38, creatinine 0.77, and glucose 190.  White count 5.4, H and H 7.2 and 24, MCV of 94 and platelet count of 139.   Urinalysis is negative.  ASSESSMENT AND PLAN: 1. Acute blood loss anemia, acute on chronic.  We will go ahead and     transfuse the patient 2 units of packed red blood cells as well as     an iron infusion. 2. Diabetes mellitus.  Continue metformin plus sliding scale. 3. Hypertension. 4. Hypokalemia.  We will replace.     Hollice Espy, M.D.     SKK/MEDQ  D:  05/27/2011  T:  05/27/2011  Job:  [id number]  cc:   Rosalyn Gess. Norins, MD [address] [place] [address]  Lajuana Matte, M.D.  Electronically Signed by Virginia Rochester M.D. on 06/02/2011 10:21:05 AM

## 2011-06-21 DIAGNOSIS — E119 Type 2 diabetes mellitus without complications: Secondary | ICD-10-CM

## 2011-06-21 DIAGNOSIS — D649 Anemia, unspecified: Secondary | ICD-10-CM

## 2011-06-21 DIAGNOSIS — M169 Osteoarthritis of hip, unspecified: Secondary | ICD-10-CM

## 2011-06-21 DIAGNOSIS — IMO0001 Reserved for inherently not codable concepts without codable children: Secondary | ICD-10-CM

## 2011-06-23 ENCOUNTER — Other Ambulatory Visit: Payer: Self-pay | Admitting: Internal Medicine

## 2011-06-25 ENCOUNTER — Telehealth: Payer: Self-pay | Admitting: Internal Medicine

## 2011-06-25 ENCOUNTER — Telehealth: Payer: Self-pay | Admitting: *Deleted

## 2011-06-25 ENCOUNTER — Other Ambulatory Visit: Payer: Self-pay | Admitting: *Deleted

## 2011-06-25 DIAGNOSIS — D649 Anemia, unspecified: Secondary | ICD-10-CM

## 2011-06-25 NOTE — Telephone Encounter (Signed)
[name] called stating that pt needs to schedule her January lab and f/u appt with Dr Donnald Garre, onc tx schedule form filled out.  SLJ

## 2011-06-25 NOTE — Telephone Encounter (Signed)
S/w the pt's son greg regarding the pt's dec lab appt and for them to pick up the  Rest of the appt calendars at that time.

## 2011-07-01 ENCOUNTER — Telehealth: Payer: Self-pay | Admitting: Internal Medicine

## 2011-07-01 ENCOUNTER — Other Ambulatory Visit (INDEPENDENT_AMBULATORY_CARE_PROVIDER_SITE_OTHER): Payer: Medicare Other

## 2011-07-01 ENCOUNTER — Other Ambulatory Visit: Payer: Self-pay | Admitting: Internal Medicine

## 2011-07-01 DIAGNOSIS — D649 Anemia, unspecified: Secondary | ICD-10-CM

## 2011-07-01 LAB — CBC WITH DIFFERENTIAL/PLATELET
Basophils Absolute: 0 10*3/uL (ref 0.0–0.1)
Basophils Relative: 0.2 % (ref 0.0–3.0)
Hemoglobin: 6.7 g/dL — CL (ref 12.0–15.0)
Lymphocytes Relative: 14.9 % (ref 12.0–46.0)
Monocytes Relative: 9.1 % (ref 3.0–12.0)
Neutro Abs: 3.5 10*3/uL (ref 1.4–7.7)
Neutrophils Relative %: 74.4 % (ref 43.0–77.0)
RBC: 2.15 Mil/uL — ABNORMAL LOW (ref 3.87–5.11)
RDW: 19.7 % — ABNORMAL HIGH (ref 11.5–14.6)

## 2011-07-01 NOTE — Telephone Encounter (Signed)
Called Mrs. Smiddy to report her Hgb was 6.7g. She is asymptomatic except for weakness. She denies any chest pain, no shortness of breath, no visible bleeding.  Called and left a msg for her daughter Theodosia Paling. If Mrs. Vallely gets symptomatic - ED for admission. Otherwise either outpt transfusion or 24 hr obs

## 2011-07-06 ENCOUNTER — Observation Stay (HOSPITAL_COMMUNITY)
Admission: AD | Admit: 2011-07-06 | Discharge: 2011-07-08 | Disposition: A | Discharge status: Home / Self Care | Payer: Medicare Other | Source: Ambulatory Visit | Attending: Internal Medicine | Admitting: Internal Medicine

## 2011-07-06 ENCOUNTER — Encounter (HOSPITAL_COMMUNITY): Payer: Self-pay | Admitting: *Deleted

## 2011-07-06 ENCOUNTER — Telehealth: Payer: Self-pay | Admitting: *Deleted

## 2011-07-06 DIAGNOSIS — R5381 Other malaise: Secondary | ICD-10-CM | POA: Insufficient documentation

## 2011-07-06 DIAGNOSIS — D62 Acute posthemorrhagic anemia: Principal | ICD-10-CM | POA: Insufficient documentation

## 2011-07-06 DIAGNOSIS — I2510 Atherosclerotic heart disease of native coronary artery without angina pectoris: Secondary | ICD-10-CM | POA: Insufficient documentation

## 2011-07-06 DIAGNOSIS — K449 Diaphragmatic hernia without obstruction or gangrene: Secondary | ICD-10-CM | POA: Insufficient documentation

## 2011-07-06 DIAGNOSIS — E119 Type 2 diabetes mellitus without complications: Secondary | ICD-10-CM | POA: Diagnosis present

## 2011-07-06 DIAGNOSIS — D649 Anemia, unspecified: Secondary | ICD-10-CM | POA: Diagnosis present

## 2011-07-06 DIAGNOSIS — I509 Heart failure, unspecified: Secondary | ICD-10-CM | POA: Diagnosis present

## 2011-07-06 DIAGNOSIS — Z79899 Other long term (current) drug therapy: Secondary | ICD-10-CM | POA: Insufficient documentation

## 2011-07-06 DIAGNOSIS — I10 Essential (primary) hypertension: Secondary | ICD-10-CM | POA: Insufficient documentation

## 2011-07-06 DIAGNOSIS — K5521 Angiodysplasia of colon with hemorrhage: Secondary | ICD-10-CM | POA: Insufficient documentation

## 2011-07-06 LAB — BASIC METABOLIC PANEL
CO2: 27 mEq/L (ref 19–32)
Chloride: 104 mEq/L (ref 96–112)
Creatinine, Ser: 0.91 mg/dL (ref 0.50–1.10)
GFR calc Af Amer: 63 mL/min — ABNORMAL LOW (ref >90)
Potassium: 3.2 mEq/L — ABNORMAL LOW (ref 3.5–5.1)

## 2011-07-06 LAB — HEMOGLOBIN: Hemoglobin: 6.8 g/dL — CL (ref 12.0–15.0)

## 2011-07-06 LAB — PREPARE RBC (CROSSMATCH)

## 2011-07-06 LAB — PLATELET COUNT: Platelets: 158 10*3/uL (ref 150–400)

## 2011-07-06 MED ORDER — POTASSIUM CHLORIDE CRYS ER 20 MEQ PO TBCR
20.0000 meq | EXTENDED_RELEASE_TABLET | Freq: Every day | ORAL | Status: DC
  Administered 2011-07-06 – 2011-07-08 (×3): 20 meq via ORAL
  Filled 2011-07-06 (×3): qty 1

## 2011-07-06 MED ORDER — SODIUM CHLORIDE 0.9 % IV SOLN
INTRAVENOUS | Status: DC
  Administered 2011-07-06: 19:00:00 via INTRAVENOUS

## 2011-07-06 MED ORDER — METOPROLOL TARTRATE 50 MG PO TABS
50.0000 mg | ORAL_TABLET | Freq: Two times a day (BID) | ORAL | Status: DC
  Administered 2011-07-06 – 2011-07-08 (×4): 50 mg via ORAL
  Filled 2011-07-06 (×5): qty 1

## 2011-07-06 MED ORDER — HYDROCODONE-ACETAMINOPHEN 5-325 MG PO TABS
1.0000 | ORAL_TABLET | Freq: Four times a day (QID) | ORAL | Status: DC | PRN
  Administered 2011-07-07: 1 via ORAL
  Filled 2011-07-06: qty 1

## 2011-07-06 MED ORDER — ACETAMINOPHEN 325 MG PO TABS
650.0000 mg | ORAL_TABLET | Freq: Four times a day (QID) | ORAL | Status: DC | PRN
  Administered 2011-07-06 – 2011-07-07 (×2): 650 mg via ORAL
  Filled 2011-07-06: qty 1

## 2011-07-06 MED ORDER — TRAZODONE HCL 50 MG PO TABS
50.0000 mg | ORAL_TABLET | Freq: Every day | ORAL | Status: DC
  Administered 2011-07-06 – 2011-07-07 (×2): 50 mg via ORAL
  Filled 2011-07-06 (×3): qty 1

## 2011-07-06 MED ORDER — OLMESARTAN MEDOXOMIL 40 MG PO TABS
40.0000 mg | ORAL_TABLET | Freq: Every day | ORAL | Status: DC
  Administered 2011-07-07 – 2011-07-08 (×2): 40 mg via ORAL
  Filled 2011-07-06 (×2): qty 1

## 2011-07-06 MED ORDER — ONDANSETRON HCL 4 MG/2ML IJ SOLN: 4.0000 mg | Freq: Four times a day (QID) | INTRAMUSCULAR | Status: DC | PRN

## 2011-07-06 MED ORDER — ONDANSETRON HCL 4 MG PO TABS: 4.0000 mg | ORAL_TABLET | Freq: Four times a day (QID) | ORAL | Status: DC | PRN

## 2011-07-06 MED ORDER — ALUM & MAG HYDROXIDE-SIMETH 200-200-20 MG/5ML PO SUSP: 30.0000 mL | Freq: Four times a day (QID) | ORAL | Status: DC | PRN

## 2011-07-06 MED ORDER — METOLAZONE 2.5 MG PO TABS
2.5000 mg | ORAL_TABLET | Freq: Every day | ORAL | Status: DC
  Administered 2011-07-06 – 2011-07-08 (×3): 2.5 mg via ORAL
  Filled 2011-07-06 (×3): qty 1

## 2011-07-06 MED ORDER — TEMAZEPAM 15 MG PO CAPS
15.0000 mg | ORAL_CAPSULE | Freq: Every day | ORAL | Status: DC
  Administered 2011-07-06 – 2011-07-07 (×2): 15 mg via ORAL
  Filled 2011-07-06 (×2): qty 1

## 2011-07-06 MED ORDER — ACETAMINOPHEN 325 MG PO TABS
ORAL_TABLET | ORAL | Status: AC
  Administered 2011-07-06: 650 mg via ORAL
  Filled 2011-07-06: qty 2

## 2011-07-06 MED ORDER — DIGOXIN 0.0625 MG HALF TABLET
0.0625 mg | ORAL_TABLET | Freq: Every day | ORAL | Status: DC
  Administered 2011-07-07 – 2011-07-08 (×2): 0.0625 mg via ORAL
  Filled 2011-07-06 (×2): qty 1

## 2011-07-06 MED ORDER — PANTOPRAZOLE SODIUM 40 MG PO TBEC
40.0000 mg | DELAYED_RELEASE_TABLET | Freq: Every day | ORAL | Status: DC
  Administered 2011-07-07 – 2011-07-08 (×2): 40 mg via ORAL
  Filled 2011-07-06 (×2): qty 1

## 2011-07-06 MED ORDER — METFORMIN HCL 500 MG PO TABS
500.0000 mg | ORAL_TABLET | Freq: Two times a day (BID) | ORAL | Status: DC
  Administered 2011-07-06 – 2011-07-08 (×4): 500 mg via ORAL
  Filled 2011-07-06 (×5): qty 1

## 2011-07-06 MED ORDER — FUROSEMIDE 10 MG/ML IJ SOLN
20.0000 mg | Freq: Once | INTRAMUSCULAR | Status: AC
  Administered 2011-07-07: 20 mg via INTRAVENOUS

## 2011-07-06 MED ORDER — ACETAMINOPHEN 650 MG RE SUPP: 650.0000 mg | Freq: Four times a day (QID) | RECTAL | Status: DC | PRN

## 2011-07-06 MED ORDER — ZOLPIDEM TARTRATE 5 MG PO TABS: 5.0000 mg | ORAL_TABLET | Freq: Every evening | ORAL | Status: DC | PRN

## 2011-07-06 NOTE — H&P (Signed)
[name] is an 77 y.o. female.   Chief Complaint: weakness, progress HPI: [name], a delightful 77 y/o widow has a h/o AVMs with recurrent blood loss anemia. She has had multiple admissions for anemia for the purpose of transfusion. She recently had a spontaneous bleed from her left ankle and since that time has had progressive weakness. She denies C/P, SOB, abdominal pain. She had a H/H in the office 07/01/11 with a Hgb of 7.6. She is now electively for 24 observation for transfusion. She has been otherwise stable in the presence of a long medical problem list.  Past Medical History  Diagnosis Date  . Inguinal hernia   . Mitral valve insufficiency and aortic valve insufficiency   . Other and unspecified hyperlipidemia   . Pneumonia   . Osteoarthrosis, unspecified whether generalized or localized, unspecified site   . Nephrolithiasis   . Unspecified essential hypertension   . Type II or unspecified type diabetes mellitus without mention of complication, not stated as uncontrolled   . Coronary atherosclerosis of unspecified type of vessel, native or graft   . Congestive heart failure, unspecified   . Atrial fibrillation   . Unspecified asthma   . Anemia, unspecified   . Allergic rhinitis, cause unspecified   . Colon polyps 10/2009    Past Surgical History  Procedure Date  . Breast cyst aspiration     x50  . Abdominal hysterectomy 1990  . Lumbar fusion 2007    [name]  . Total hip arthroplasty 01/2007    Right - Dr [name]  . Inguinal hernia repair 2010    [name]    Family History  Problem Relation Age of Onset  . Heart disease Father     CHF  . Lymphoma Sister   . Colon cancer Neg Hx    Social History:  reports that she has never smoked. She does not have any smokeless tobacco history on file. She reports that she does not drink alcohol. Her drug history not on file.  Allergies:  Allergies  Allergen Reactions  . Codeine     REACTION: Hives    No current  facility-administered medications on file as of 07/06/2011.   Medications Prior to Admission  Medication Sig Dispense Refill  . AVAPRO 300 MG tablet TAKE ONE TABLET BY MOUTH EVERY DAY  90 each  3  . clonazePAM (KLONOPIN) 0.5 MG tablet Take 0.5 mg by mouth 2 (two) times daily as needed.        . doxazosin (CARDURA) 4 MG tablet TAKE ONE TABLET BY MOUTH AT BEDTIME FOR BLOOD PRESSURE  30 tablet  6  . esomeprazole (NEXIUM) 40 MG capsule Take 40 mg by mouth every morning.        . Fe Fum-FePoly-FA-Vit C-Vit B3 (INTEGRA F) 125-1 MG CAPS Take 1 capsule by mouth daily.        Marland Kitchen FeFum-FePoly-FA-B Cmp-C-Biot (INTEGRA PLUS) CAPS TAKE ONE CAPSULE BY MOUTH EVERY DAY.  30 each  2  . fexofenadine (ALLEGRA) 180 MG tablet Take 180 mg by mouth as needed.        . furosemide (LASIX) 40 MG tablet Take 40 mg by mouth 2 (two) times daily.        Marland Kitchen HYDROcodone-acetaminophen (NORCO) 5-325 MG per tablet TAKE ONE TABLET BY MOUTH EVERY 6 HOURS AS NEEDED  90 tablet  6  . KLOR-CON M20 20 MEQ tablet TAKE ONE TABLET BY MOUTH TWICE DAILY  60 each  5  . LANOXIN 0.125  MG tablet TAKE ONE TABLET BY MOUTH EVERY DAY  30 each  6  . metFORMIN (GLUCOPHAGE) 500 MG tablet TAKE ONE TABLET BY MOUTH EVERY DAY  30 tablet  6  . metolazone (ZAROXOLYN) 2.5 MG tablet TAKE ONE TABLET BY MOUTH EVERY MORNING ON MONDAY, WEDNESDAY, AND FRIDAY 30 MINUTES BEFORE TAKING FUROSEMIDE.  30 tablet  6  . metoprolol (LOPRESSOR) 50 MG tablet TAKE ONE TABLET BY MOUTH TWICE DAILY  60 tablet  6  . simvastatin (ZOCOR) 20 MG tablet TAKE ONE TABLET BY MOUTH EVERY DAY  90 tablet  1  . temazepam (RESTORIL) 15 MG capsule TAKE ONE CAPSULE BY MOUTH AT BEDTIME  30 capsule  6  . traZODone (DESYREL) 50 MG tablet TAKE ONE TO TWO TABLETS BY MOUTH AT BEDTIME AS NEEDED  30 tablet  5    No results found for this or any previous visit (from the past 48 hour(s)). No results found.  Review of Systems  Constitutional: Positive for malaise/fatigue. Negative for fever, chills and  weight loss.  HENT: Negative for ear pain, congestion and sore throat.   Eyes: Negative.   Respiratory: Positive for shortness of breath. Negative for cough, hemoptysis, sputum production and wheezing.   Cardiovascular: Positive for leg swelling. Negative for chest pain and palpitations.  Gastrointestinal: Negative for heartburn, nausea, abdominal pain and constipation.  Genitourinary: Negative.   Musculoskeletal: Positive for myalgias. Negative for back pain and joint pain.  Skin: Positive for itching. Negative for rash.  Neurological: Positive for weakness. Negative for dizziness, sensory change, focal weakness and headaches.  Endo/Heme/Allergies: Negative.   Psychiatric/Behavioral: Negative for depression and memory loss. The patient is nervous/anxious.     Blood pressure 155/68, pulse 63, temperature 98.4 F (36.9 C), temperature source Oral, resp. rate 16, height 5\' 4"  (1.626 m), SpO2 97.00%. Physical Exam  Elderly white woman in no acute distress HEENT - [place]/AT, C&S clear, no oral lesions Neck - supple Chest - no deformity Lungs- good BS and equal, no rales , wheezes or rhonchi Cor - 2+ radial and DP pulse, quiet precordium, RRR, III/VI systolic murmur RSB, LSB, no heave or thrill Abd- BS+ x 4, no HSM, no guarding or rebound Genitalia/rectal exam deferred Neuro- A&O x 3, CN II-XII grossly intact Derm - bandaid on left ankle at site of recent bleed. No active lesion Assessment/Plan 1. Anemia - patient with known AVMs and periodic blood loss anemia. She has been feeling much weaker than normal but denies C/P, SOB. She feels she did loose a lot of blood from spontaneous bleed from let ankle. Her Hgb = 7.6 07/01/11 below her maintenance level of about 10g.  Plan - patient for 24 hr adm           Type, cross and transfuse 3 units of blood  2. Diabetes - will continue home meds. Check A1C  3. CHF- chronic systolic HF currently stable.  Plan - will continue critical home meds            Will give lasix between units of blood  4. HTN - will continue home meds  5. Code status - limited code: no CPR, DNI  [name] 07/06/2011, 5:47 PM

## 2011-07-06 NOTE — Telephone Encounter (Signed)
Pt's daughter calling , pt is still feeling weak. Should pt have hemoglobin rechecked today. Please advise

## 2011-07-06 NOTE — Telephone Encounter (Signed)
Spoke with Dr Debby Bud, he advised to call to Hospital for admission for non tele bed for observation for transfusion for symptomatic anemia. I called over to Admitting to arrange for a bed . Pts daughter has been informed that admitting will call her on her cell when they have a bed available. Nursing staff advised to call Dr Debby Bud when pt arrives

## 2011-07-07 DIAGNOSIS — D62 Acute posthemorrhagic anemia: Secondary | ICD-10-CM

## 2011-07-07 LAB — HEMOGLOBIN A1C
Hgb A1c MFr Bld: 6 % — ABNORMAL HIGH (ref <5.7)
Mean Plasma Glucose: 126 mg/dL — ABNORMAL HIGH (ref <117)

## 2011-07-07 LAB — HEMOGLOBIN AND HEMATOCRIT, BLOOD: HCT: 31.1 % — ABNORMAL LOW (ref 36.0–46.0)

## 2011-07-07 MED ORDER — FUROSEMIDE 10 MG/ML IJ SOLN
20.0000 mg | Freq: Once | INTRAMUSCULAR | Status: AC
  Administered 2011-07-07: 20 mg via INTRAVENOUS
  Filled 2011-07-07: qty 2

## 2011-07-07 MED ORDER — FUROSEMIDE 10 MG/ML IJ SOLN
INTRAMUSCULAR | Status: AC
  Filled 2011-07-07: qty 4

## 2011-07-07 MED ORDER — FUROSEMIDE 10 MG/ML IJ SOLN
INTRAMUSCULAR | Status: AC
  Administered 2011-07-07: 20 mg via INTRAVENOUS
  Filled 2011-07-07: qty 4

## 2011-07-07 NOTE — Progress Notes (Signed)
Subjective: Patient is receiving third unit of blood. She reports that she is feeling much better. Cross-match took longer than expected and transfusion was started about 11:00  Objective: Lab: Hgb 11/26 - 6.8 Bmet - K 3.2, Cr 0.91 Imaging:   Physical Exam: Vitals reviewed - stable. Pt sitting on side of bed Cor - RRR Pulm - normal respirations      Assessment/Plan: 1. Anemia - blood loss from AVMs: Hgb 7.6 11/21 to 6.8 11/26. Feeling better having received transfusion.  Plan - d/c home in AM   Illene Regulus 07/07/2011, 6:35 PM

## 2011-07-08 LAB — TYPE AND SCREEN
Antibody Screen: NEGATIVE
Donor AG Type: NEGATIVE
Donor AG Type: NEGATIVE
Unit division: 0
Unit division: 0

## 2011-07-08 NOTE — Progress Notes (Signed)
Pt d'c home. piv d'c. Pt signed d'c papers. Son took pt to car in wheelchair. d'c instructions explained.

## 2011-07-08 NOTE — Plan of Care (Signed)
Problem: Phase II Progression Outcomes Goal: Discharge plan established Outcome: Completed/Met Date Met:  07/08/11 Plan for discharge 11/28

## 2011-07-08 NOTE — Discharge Summary (Signed)
NAMESHAKEIA, KRUS NO.:  [id number]  MEDICAL RECORD NO.:  [id number]  LOCATION:  1317                         FACILITY:  [facility]  PHYSICIAN:  Rosalyn Gess. Norins, MD  DATE OF BIRTH:  12-25-1919  DATE OF ADMISSION:  07/06/2011 DATE OF DISCHARGE:                              DISCHARGE SUMMARY   ADMITTING DIAGNOSIS:  Anemia from acute blood loss.  DISCHARGE DIAGNOSIS:  Anemia from acute blood loss, resolved.  CONSULTANTS:  None.  PROCEDURES:  None.  HISTORY OF PRESENT ILLNESS:  Ms. Cieslewicz is an 77 year old woman with a history of small bowel AVMs with intermittent and periodic bleeding. She was feeling weak and tired, consistent with prior symptoms of anemia.  She had a blood count checked on July 01, 2011, which came back at 7.6 g.  The patient preferred to wait until after the Thanksgiving holiday to have treatment.  She was electively admitted on Monday, the 26th, for transfusion.  Please see the H and P for past medical history, family history, social history, and exam.  HOSPITAL COURSE:  The patient was admitted to a regular bed.  Hemoglobin was recheck and came back at 6.8 g.  She was typed and crossmatched for 3 units of packed red blood cells.  She has multiple antibodies, so there was a delay in getting her blood procured and a transfusion was started at 11:00 a.m. on July 07, 2011.  The patient did well.  She was given Lasix in between units 1 and 2 and units 2 and 3.  She had no problems with fluid overload.  Followup hemoglobin was 10.2 g on 11/27 at 21:50 hours.  The patient at this point is feeling well.  She has much more strength. She feels no chest pain or chest discomfort.  No shortness of breath. At this point, she is ready for discharge home.  PHYSICAL EXAMINATION:  VITAL SIGNS:  Temperature was 98.5, blood pressure was 138/66, heart rate 58, respirations 16. GENERAL APPEARANCE:  This is a pleasant older woman sitting on the  side of the bed, eating her breakfast, in no distress. HEENT:  Unremarkable except for mild temporal wasting. CHEST:  The patient is moving air well with no rales, wheezes, or rhonchi.  There is no increased work of breathing. CARDIOVASCULAR:  The patient's precordium was quiet.  She had a regular rate and rhythm.  No further examination conducted.  DISPOSITION:  The patient is to be discharged home.  She will return for a followup blood count based on symptoms.  She is stable at the time of discharge dictation and ready to be discharged to home.     Rosalyn Gess Norins, MD     MEN/MEDQ  D:  07/08/2011  T:  07/08/2011  Job:  [id number]

## 2011-07-10 ENCOUNTER — Telehealth: Payer: Self-pay

## 2011-07-10 DIAGNOSIS — D649 Anemia, unspecified: Secondary | ICD-10-CM

## 2011-07-10 NOTE — Telephone Encounter (Signed)
Hgb at discharge was 10.6 g. Fatigue is multifactorial incluidng advanced age. May return for H/H Dec 4th  285.9 dx code

## 2011-07-10 NOTE — Telephone Encounter (Signed)
Pt's son called stating pt was D/C from hospital 11/28 after having blood transfusion. Son says pt is still feeling tired and fatigue, is this to be expected? He would also like to know pt's HgB at the time of discharge?

## 2011-07-10 NOTE — Telephone Encounter (Signed)
Left message on machine for pt's son to return my call

## 2011-07-13 NOTE — Telephone Encounter (Signed)
Pt's son informed-Lab orders placed-will come in lab 12.04.12

## 2011-07-16 ENCOUNTER — Telehealth: Payer: Self-pay | Admitting: *Deleted

## 2011-07-16 MED ORDER — RAMELTEON 8 MG PO TABS: 8.0000 mg | ORAL_TABLET | Freq: Every day | ORAL | Status: DC

## 2011-07-16 NOTE — Telephone Encounter (Signed)
Son states that Temazepam is no longer working for Pt; not sleeping, request new alternative Rx [cannot take Ambien, has had reaction before]

## 2011-07-16 NOTE — Telephone Encounter (Signed)
Trial of rozerem 8 mg qhs - to be taken daily. #30 - sent to [facility]. Safe in the elderly

## 2011-07-17 ENCOUNTER — Other Ambulatory Visit: Payer: Self-pay | Admitting: Internal Medicine

## 2011-07-17 NOTE — Telephone Encounter (Signed)
Pt's son informed

## 2011-07-29 ENCOUNTER — Other Ambulatory Visit: Payer: Self-pay | Admitting: *Deleted

## 2011-07-29 MED ORDER — SIMVASTATIN 20 MG PO TABS: 20.0000 mg | ORAL_TABLET | Freq: Every day | ORAL | Status: DC

## 2011-08-06 ENCOUNTER — Other Ambulatory Visit (HOSPITAL_BASED_OUTPATIENT_CLINIC_OR_DEPARTMENT_OTHER): Payer: Medicare Other | Admitting: Lab

## 2011-08-06 DIAGNOSIS — D649 Anemia, unspecified: Secondary | ICD-10-CM

## 2011-08-06 LAB — CBC WITH DIFFERENTIAL/PLATELET
Basophils Absolute: 0 10*3/uL (ref 0.0–0.1)
EOS%: 2.6 % (ref 0.0–7.0)
HCT: 24.3 % — ABNORMAL LOW (ref 34.8–46.6)
HGB: 8.1 g/dL — ABNORMAL LOW (ref 11.6–15.9)
LYMPH%: 13.5 % — ABNORMAL LOW (ref 14.0–49.7)
MCH: 31.2 pg (ref 25.1–34.0)
MCV: 93.3 fL (ref 79.5–101.0)
MONO%: 9.1 % (ref 0.0–14.0)
NEUT%: 74.7 % (ref 38.4–76.8)
Platelets: 142 10*3/uL — ABNORMAL LOW (ref 145–400)
RDW: 18.5 % — ABNORMAL HIGH (ref 11.2–14.5)

## 2011-08-17 ENCOUNTER — Telehealth: Payer: Self-pay | Admitting: Internal Medicine

## 2011-08-17 ENCOUNTER — Other Ambulatory Visit: Payer: Self-pay | Admitting: Internal Medicine

## 2011-08-17 NOTE — Progress Notes (Signed)
And cancelled lab appointment for tomorrow. HE has flu and no one else can take her. Please r/s to Thursday and call him with appointment. I sent a scheduling request

## 2011-08-17 NOTE — Telephone Encounter (Signed)
Please r/s labs from tomorrow to Thursday -onc scheduling request sent

## 2011-08-20 ENCOUNTER — Other Ambulatory Visit (HOSPITAL_BASED_OUTPATIENT_CLINIC_OR_DEPARTMENT_OTHER): Payer: Medicare Other | Admitting: Lab

## 2011-08-20 ENCOUNTER — Other Ambulatory Visit: Payer: Self-pay | Admitting: Internal Medicine

## 2011-08-20 DIAGNOSIS — D649 Anemia, unspecified: Secondary | ICD-10-CM

## 2011-08-20 DIAGNOSIS — D638 Anemia in other chronic diseases classified elsewhere: Secondary | ICD-10-CM

## 2011-08-20 LAB — CBC WITH DIFFERENTIAL/PLATELET
Eosinophils Absolute: 0.1 10*3/uL (ref 0.0–0.5)
HCT: 27.5 % — ABNORMAL LOW (ref 34.8–46.6)
LYMPH%: 13.4 % — ABNORMAL LOW (ref 14.0–49.7)
MONO#: 0.5 10*3/uL (ref 0.1–0.9)
NEUT#: 3.8 10*3/uL (ref 1.5–6.5)
NEUT%: 74.5 % (ref 38.4–76.8)
Platelets: 148 10*3/uL (ref 145–400)
WBC: 5.2 10*3/uL (ref 3.9–10.3)
lymph#: 0.7 10*3/uL — ABNORMAL LOW (ref 0.9–3.3)

## 2011-08-26 ENCOUNTER — Telehealth: Payer: Self-pay | Admitting: Internal Medicine

## 2011-08-26 ENCOUNTER — Other Ambulatory Visit: Payer: Self-pay | Admitting: Internal Medicine

## 2011-08-26 DIAGNOSIS — D649 Anemia, unspecified: Secondary | ICD-10-CM

## 2011-08-26 NOTE — Telephone Encounter (Signed)
called son with new appt for 2/5 at 2:45,aom

## 2011-08-26 NOTE — Telephone Encounter (Signed)
Per Dr Donnald Garre okay to r/s  her appointment . Will send on treatment schedule request

## 2011-08-26 NOTE — Telephone Encounter (Signed)
Asking if pt appointment is just a f/u or will she be getting any shot or infusion. He does not want to bring her out of the house and expose her to flu if it is just a f/u appointment. He is recovering from flu. I told him I will consult with Dr Donnald Garre and call him back.

## 2011-08-26 NOTE — Telephone Encounter (Signed)
Temazepam refill request #[id number]. Last refilled 7.23.12. OK to refill?

## 2011-08-27 ENCOUNTER — Ambulatory Visit: Payer: Medicare Other | Admitting: Internal Medicine

## 2011-09-15 ENCOUNTER — Ambulatory Visit (HOSPITAL_BASED_OUTPATIENT_CLINIC_OR_DEPARTMENT_OTHER): Payer: Medicare Other | Admitting: Internal Medicine

## 2011-09-15 ENCOUNTER — Other Ambulatory Visit (HOSPITAL_BASED_OUTPATIENT_CLINIC_OR_DEPARTMENT_OTHER): Payer: Medicare Other | Admitting: Lab

## 2011-09-15 DIAGNOSIS — D638 Anemia in other chronic diseases classified elsewhere: Secondary | ICD-10-CM

## 2011-09-15 DIAGNOSIS — D649 Anemia, unspecified: Secondary | ICD-10-CM

## 2011-09-15 DIAGNOSIS — D509 Iron deficiency anemia, unspecified: Secondary | ICD-10-CM

## 2011-09-15 DIAGNOSIS — D500 Iron deficiency anemia secondary to blood loss (chronic): Secondary | ICD-10-CM

## 2011-09-15 LAB — COMPREHENSIVE METABOLIC PANEL
Albumin: 3.9 g/dL (ref 3.5–5.2)
BUN: 42 mg/dL — ABNORMAL HIGH (ref 6–23)
CO2: 29 mEq/L (ref 19–32)
Glucose, Bld: 277 mg/dL — ABNORMAL HIGH (ref 70–99)
Sodium: 140 mEq/L (ref 135–145)
Total Bilirubin: 0.6 mg/dL (ref 0.3–1.2)
Total Protein: 7.5 g/dL (ref 6.0–8.3)

## 2011-09-15 LAB — CBC WITH DIFFERENTIAL/PLATELET
Basophils Absolute: 0 10*3/uL (ref 0.0–0.1)
Eosinophils Absolute: 0.2 10*3/uL (ref 0.0–0.5)
HCT: 27.1 % — ABNORMAL LOW (ref 34.8–46.6)
HGB: 8.8 g/dL — ABNORMAL LOW (ref 11.6–15.9)
LYMPH%: 14.2 % (ref 14.0–49.7)
MCV: 91.2 fL (ref 79.5–101.0)
MONO#: 0.5 10*3/uL (ref 0.1–0.9)
MONO%: 9.6 % (ref 0.0–14.0)
NEUT#: 3.5 10*3/uL (ref 1.5–6.5)
Platelets: 125 10*3/uL — ABNORMAL LOW (ref 145–400)
RBC: 2.97 10*6/uL — ABNORMAL LOW (ref 3.70–5.45)
WBC: 4.8 10*3/uL (ref 3.9–10.3)

## 2011-09-15 NOTE — Progress Notes (Signed)
[facility] OFFICE PROGRESS NOTE  Illene Regulus, MD, MD [address] [place] [address]  PRINCIPAL DIAGNOSIS:  Anemia of chronic disease plus iron deficiency.  PRIOR THERAPY:  Status post Feraheme infusion x6 doses; last dose was given on March 11, 2011.  CURRENT THERAPY:  Integra Plus 1 capsule p.o. daily.  INTERVAL HISTORY: [name] 78 y.o. female returns to the clinic today for followup visit accompanied her son. The patient has no significant complaints today except for mild fatigue. She denied having any significant weight loss or night sweats. She has no chest pain or shortness of breath. No dizzy spells. He has repeat CBC performed earlier today and she is here for evaluation and discussion of her lab results. She is currently on Integra plus and tolerating it fairly well.  MEDICAL HISTORY: Past Medical History  Diagnosis Date  . Inguinal hernia   . Mitral valve insufficiency and aortic valve insufficiency   . Other and unspecified hyperlipidemia   . Pneumonia   . Osteoarthrosis, unspecified whether generalized or localized, unspecified site   . Nephrolithiasis   . Unspecified essential hypertension   . Type II or unspecified type diabetes mellitus without mention of complication, not stated as uncontrolled   . Coronary atherosclerosis of unspecified type of vessel, native or graft   . Congestive heart failure, unspecified   . Atrial fibrillation   . Unspecified asthma   . Anemia, unspecified   . Allergic rhinitis, cause unspecified   . Colon polyps 10/2009    ALLERGIES:  is allergic to codeine.  MEDICATIONS:  Current Outpatient Prescriptions  Medication Sig Dispense Refill  . AVAPRO 300 MG tablet TAKE ONE TABLET BY MOUTH EVERY DAY  90 each  3  . clonazePAM (KLONOPIN) 0.5 MG tablet Take 0.5 mg by mouth 2 (two) times daily as needed.        . doxazosin (CARDURA) 4 MG tablet TAKE ONE TABLET BY MOUTH AT BEDTIME FOR BLOOD PRESSURE.   30 tablet  5  . Fe Fum-FePoly-FA-Vit C-Vit B3 (INTEGRA F) 125-1 MG CAPS Take 1 capsule by mouth daily.        . fexofenadine (ALLEGRA) 180 MG tablet Take 180 mg by mouth daily.       . furosemide (LASIX) 40 MG tablet Take 40 mg by mouth 2 (two) times daily.        Marland Kitchen KLOR-CON M20 20 MEQ tablet TAKE ONE TABLET BY MOUTH TWICE DAILY  60 each  5  . LANOXIN 0.125 MG tablet TAKE ONE TABLET BY MOUTH EVERY DAY  30 each  6  . metFORMIN (GLUCOPHAGE) 500 MG tablet TAKE ONE TABLET BY MOUTH EVERY DAY  30 tablet  6  . metolazone (ZAROXOLYN) 2.5 MG tablet TAKE ONE TABLET BY MOUTH EVERY MORNING ON MONDAY, WEDNESDAY, AND FRIDAY 30 MINUTES BEFORE TAKING FUROSEMIDE.  30 tablet  6  . metoprolol (LOPRESSOR) 50 MG tablet TAKE ONE TABLET BY MOUTH TWICE DAILY  60 tablet  6  . promethazine (PHENERGAN) 12.5 MG tablet TAKE ONE TABLET BY MOUTH EVERY 6 HOURS AS NEEDED FOR NAUSEA  30 tablet  1  . simvastatin (ZOCOR) 20 MG tablet Take 1 tablet (20 mg total) by mouth at bedtime.  90 tablet  3  . temazepam (RESTORIL) 15 MG capsule TAKE ONE CAPSULE BY MOUTH AT BEDTIME  30 capsule  5  . esomeprazole (NEXIUM) 40 MG capsule Take 40 mg by mouth every morning.        Marland Kitchen  traZODone (DESYREL) 50 MG tablet TAKE ONE TO TWO TABLETS BY MOUTH AT BEDTIME AS NEEDED  30 tablet  5    SURGICAL HISTORY:  Past Surgical History  Procedure Date  . Breast cyst aspiration     x50  . Abdominal hysterectomy 1990  . Lumbar fusion 2007    [facility]  . Total hip arthroplasty 01/2007    Right - Dr Valentina Gu  . Inguinal hernia repair 2010    [facility]    REVIEW OF SYSTEMS:  A comprehensive review of systems was negative except for: Constitutional: positive for fatigue   PHYSICAL EXAMINATION: General appearance: alert, cooperative and no distress Resp: clear to auscultation bilaterally Cardio: regular rate and rhythm, S1, S2 normal, no murmur, click, rub or gallop GI: soft, non-tender; bowel sounds normal; no masses,  no organomegaly Extremities:  extremities normal, atraumatic, no cyanosis or edema  ECOG PERFORMANCE STATUS: 1 - Symptomatic but completely ambulatory  Blood pressure 146/58, pulse 66, temperature 97.9 F (36.6 C), height 5\' 4"  (1.626 m), weight 124 lb 9.6 oz (56.518 kg).  LABORATORY DATA: Lab Results  Component Value Date   WBC 4.8 09/15/2011   HGB 8.8* 09/15/2011   HCT 27.1* 09/15/2011   MCV 91.2 09/15/2011   PLT 125* 09/15/2011      Chemistry      Component Value Date/Time   NA 141 07/06/2011 1911   K 3.2* 07/06/2011 1911   CL 104 07/06/2011 1911   CO2 27 07/06/2011 1911   BUN 36* 07/06/2011 1911   CREATININE 0.91 07/06/2011 1911      Component Value Date/Time   CALCIUM 9.6 07/06/2011 1911   ALKPHOS 76 02/15/2010 0500   AST 22 02/15/2010 0500   ALT 16 02/15/2010 0500   BILITOT 1.0 02/15/2010 0500       RADIOGRAPHIC STUDIES: No results found.  ASSESSMENT: This is a very pleasant 78 years old white female with history of chronic anemia plus/minus deficiency secondary to bleeding varicose veins. The patient is currently on Integra plus and tolerating it well. She is to have low hemoglobin and hematocrit but she is currently asymptomatic.   PLAN: I discussed the lab result with the patient and her son today. I gave him the option of adding Aranesp injection and addition to the iron tablet but the patient is not interested in the treatment with subcutaneous injection of erythrocyte stimulating factor. She would like to continue on Integra plus at the current dose. I would see her back for followup visit in 3 months with repeat CBC and iron study. The patient was advised to call me immediately if she has any concerning symptoms in the interval.   All questions were answered. The patient knows to call the clinic with any problems, questions or concerns. We can certainly see the patient much sooner if necessary.

## 2011-09-22 ENCOUNTER — Other Ambulatory Visit: Payer: Self-pay | Admitting: Orthopedic Surgery

## 2011-09-22 DIAGNOSIS — M1990 Unspecified osteoarthritis, unspecified site: Secondary | ICD-10-CM

## 2011-09-22 DIAGNOSIS — M25559 Pain in unspecified hip: Secondary | ICD-10-CM

## 2011-09-24 ENCOUNTER — Ambulatory Visit
Admission: RE | Admit: 2011-09-24 | Discharge: 2011-09-24 | Disposition: A | Discharge status: Home / Self Care | Payer: Medicare Other | Source: Ambulatory Visit | Attending: Orthopedic Surgery | Admitting: Orthopedic Surgery

## 2011-09-24 DIAGNOSIS — M1990 Unspecified osteoarthritis, unspecified site: Secondary | ICD-10-CM

## 2011-09-24 DIAGNOSIS — M25559 Pain in unspecified hip: Secondary | ICD-10-CM

## 2011-09-30 ENCOUNTER — Other Ambulatory Visit: Payer: Self-pay | Admitting: Internal Medicine

## 2011-10-29 ENCOUNTER — Other Ambulatory Visit: Payer: Self-pay | Admitting: Internal Medicine

## 2011-10-29 ENCOUNTER — Other Ambulatory Visit: Payer: Self-pay | Admitting: Cardiovascular Disease

## 2011-10-29 DIAGNOSIS — D649 Anemia, unspecified: Secondary | ICD-10-CM

## 2011-11-02 ENCOUNTER — Ambulatory Visit (INDEPENDENT_AMBULATORY_CARE_PROVIDER_SITE_OTHER): Payer: Medicare Other | Admitting: Internal Medicine

## 2011-11-02 ENCOUNTER — Other Ambulatory Visit (INDEPENDENT_AMBULATORY_CARE_PROVIDER_SITE_OTHER): Payer: Medicare Other

## 2011-11-02 ENCOUNTER — Encounter: Payer: Self-pay | Admitting: Internal Medicine

## 2011-11-02 ENCOUNTER — Ambulatory Visit (INDEPENDENT_AMBULATORY_CARE_PROVIDER_SITE_OTHER)
Admission: RE | Admit: 2011-11-02 | Discharge: 2011-11-02 | Disposition: A | Discharge status: Home / Self Care | Payer: Medicare Other | Source: Ambulatory Visit | Attending: Internal Medicine | Admitting: Internal Medicine

## 2011-11-02 VITALS — BP 142/78 | HR 101 | Temp 97.5°F

## 2011-11-02 DIAGNOSIS — R5381 Other malaise: Secondary | ICD-10-CM

## 2011-11-02 DIAGNOSIS — R5383 Other fatigue: Secondary | ICD-10-CM | POA: Insufficient documentation

## 2011-11-02 DIAGNOSIS — R0781 Pleurodynia: Secondary | ICD-10-CM | POA: Insufficient documentation

## 2011-11-02 DIAGNOSIS — M549 Dorsalgia, unspecified: Secondary | ICD-10-CM | POA: Insufficient documentation

## 2011-11-02 DIAGNOSIS — R079 Chest pain, unspecified: Secondary | ICD-10-CM

## 2011-11-02 LAB — CBC WITH DIFFERENTIAL/PLATELET
Basophils Absolute: 0 10*3/uL (ref 0.0–0.1)
Eosinophils Absolute: 0.1 10*3/uL (ref 0.0–0.7)
Lymphocytes Relative: 10.4 % — ABNORMAL LOW (ref 12.0–46.0)
MCHC: 31.8 g/dL (ref 30.0–36.0)
Monocytes Relative: 9.4 % (ref 3.0–12.0)
Platelets: 142 10*3/uL — ABNORMAL LOW (ref 150.0–400.0)
RDW: 16.4 % — ABNORMAL HIGH (ref 11.5–14.6)

## 2011-11-02 LAB — BASIC METABOLIC PANEL
CO2: 27 mEq/L (ref 19–32)
Calcium: 9.7 mg/dL (ref 8.4–10.5)
Chloride: 97 mEq/L (ref 96–112)
Glucose, Bld: 264 mg/dL — ABNORMAL HIGH (ref 70–99)
Potassium: 3.3 mEq/L — ABNORMAL LOW (ref 3.5–5.1)
Sodium: 136 mEq/L (ref 135–145)

## 2011-11-02 LAB — HEPATIC FUNCTION PANEL
ALT: 21 U/L (ref 0–35)
AST: 21 U/L (ref 0–37)
Albumin: 3.8 g/dL (ref 3.5–5.2)
Alkaline Phosphatase: 112 U/L (ref 39–117)
Total Protein: 7.4 g/dL (ref 6.0–8.3)

## 2011-11-02 LAB — TSH: TSH: 3.28 u[IU]/mL (ref 0.35–5.50)

## 2011-11-02 IMAGING — CR DG CHEST 2V
2 series · 2 of 2 positions shown · non-contrast
Comparison: [DATE].

CLINICAL DATA: 89-year-old female with cough.  Right side pain.

CHEST - 2 VIEW

[view not recorded (1 of 2)]
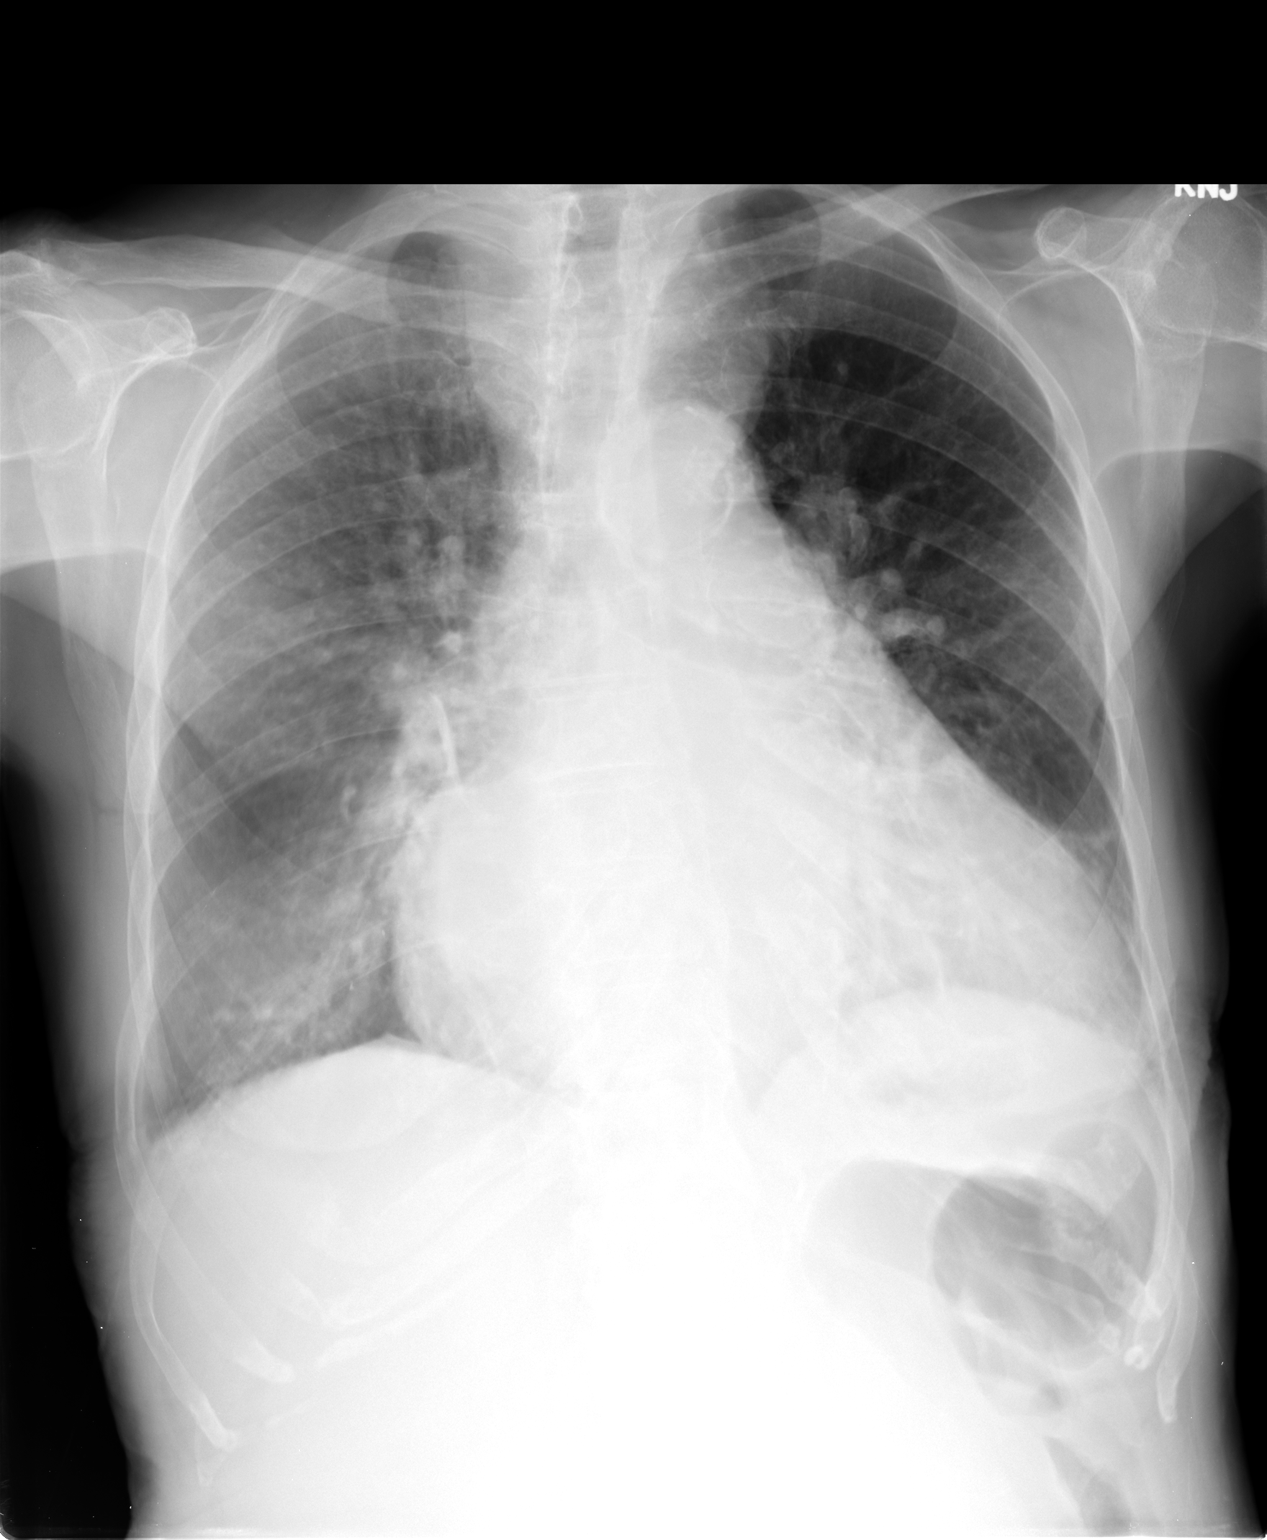

[view not recorded (2 of 2)]
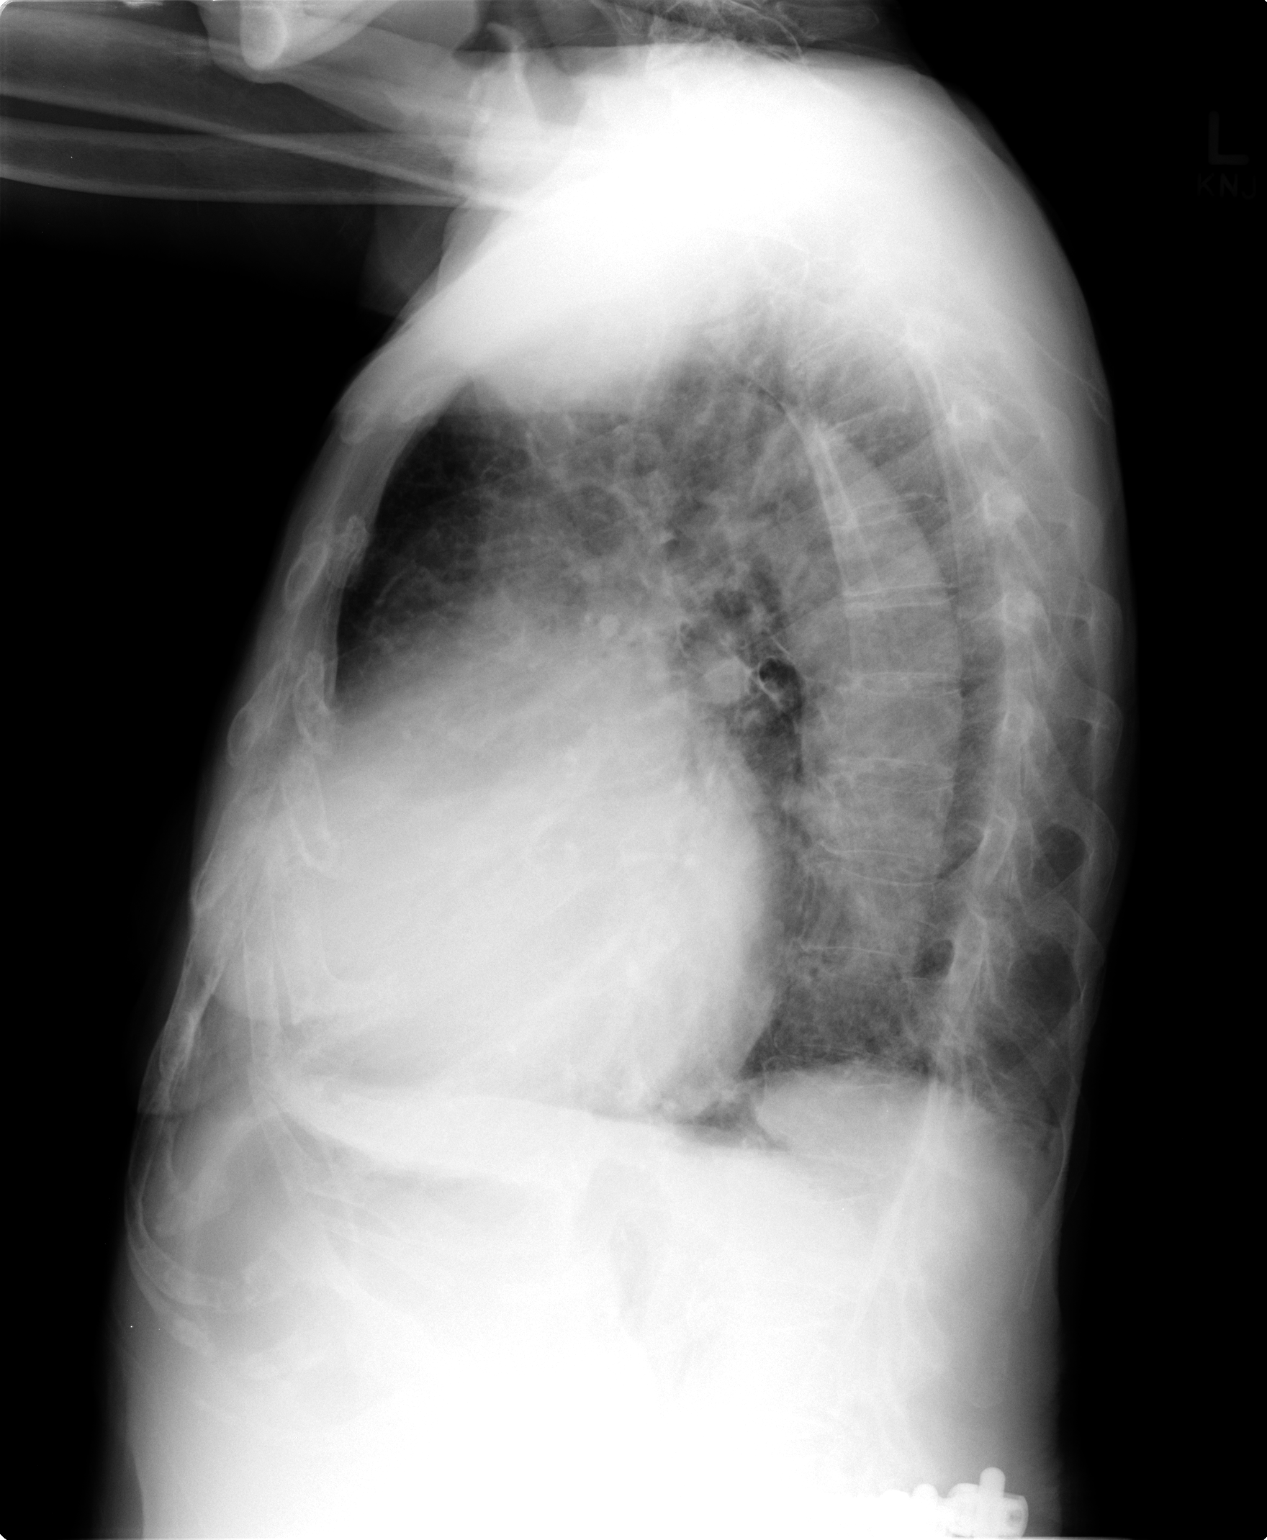

[2 of 2 positions shown; findings below may reference images not displayed]

FINDINGS: Mild respiratory motion artifact.  Chronic severe
cardiomegaly.  Stable mediastinal contours. Visualized tracheal air
column is within normal limits.  No pneumothorax.  No definite
pleural effusion.  Chronic vascular congestion has increased.
Osteopenia.  Minimally-displaced right lateral ninth rib fractures
suspected. Partially visible lumbar fusion hardware.
IMPRESSION: 1.  Minimally-displaced right lateral ninth rib fracture.  No
pneumothorax or definite effusion.
2.  Chronic severe cardiomegaly.    Chronic pulmonary vascular
congestion; difficult to exclude mild or developing interstitial
edema.

These results will be called to the ordering clinician or
representative by the Radiologist Assistant, and communication
documented in the PACS Dashboard.

## 2011-11-02 NOTE — Progress Notes (Signed)
Subjective:    Patient ID: [name], female    DOB: 1920/05/19, 78 y.o.   MRN: [id number]  HPI  Here with daughter, who helps with hx;  Pt normally walks with walker, but is in wheelchair in the office today per her preference and declines to be examined on exam table due to pain with movement;  Daughter states pain to right flank/side/back started < 48 hrs ago on sat evening, while simply lying in bed, then turning towards the left to reach for something, felt a pop, then severe pain , since worse to move about, cough, lying flat, standing up, walking;  Only sitting still seems to help reduce the pain overall;  No falls or other trauma,  No fever, and  Denies urinary symptoms such as dysuria, frequency, urgency,or hematuria.  Is s/p lumbar surgury approx 5 yrs ago, no radicular pain or LE weakness/numb/pain.  Has had some constipation in the last few days, but did have BM this am, pt thinks pain unlikely to be related.  Though not on her med list, pt does have "leftover" hydrocodone at home, "nearly a whole bottle" which she prefers to take, though has not taken any in past 2 days Past Medical History  Diagnosis Date  . Inguinal hernia   . Mitral valve insufficiency and aortic valve insufficiency   . Other and unspecified hyperlipidemia   . Pneumonia   . Osteoarthrosis, unspecified whether generalized or localized, unspecified site   . Nephrolithiasis   . Unspecified essential hypertension   . Type II or unspecified type diabetes mellitus without mention of complication, not stated as uncontrolled   . Coronary atherosclerosis of unspecified type of vessel, native or graft   . Congestive heart failure, unspecified   . Atrial fibrillation   . Unspecified asthma   . Anemia, unspecified   . Allergic rhinitis, cause unspecified   . Colon polyps 10/2009   Past Surgical History  Procedure Date  . Breast cyst aspiration     x50  . Abdominal hysterectomy 1990  . Lumbar fusion 2007   [facility]  . Total hip arthroplasty 01/2007    Right - Dr Valentina Gu  . Inguinal hernia repair 2010    [facility]    reports that she has never smoked. She does not have any smokeless tobacco history on file. She reports that she does not drink alcohol. Her drug history not on file. family history includes Heart disease in her father and Lymphoma in her sister.  There is no history of Colon cancer. Allergies  Allergen Reactions  . Codeine     REACTION: Hives   Current Outpatient Prescriptions on File Prior to Visit  Medication Sig Dispense Refill  . AVAPRO 300 MG tablet TAKE ONE TABLET BY MOUTH EVERY DAY  90 each  3  . doxazosin (CARDURA) 4 MG tablet TAKE ONE TABLET BY MOUTH AT BEDTIME FOR BLOOD PRESSURE.  30 tablet  5  . esomeprazole (NEXIUM) 40 MG capsule Take 40 mg by mouth every morning.        . Fe Fum-FePoly-FA-Vit C-Vit B3 (INTEGRA F) 125-1 MG CAPS Take 1 capsule by mouth daily.        Marland Kitchen FeFum-FePoly-FA-B Cmp-C-Biot (INTEGRA PLUS) CAPS TAKE ONE CAPSULE BY MOUTH EVERY DAY.  30 each  2  . fexofenadine (ALLEGRA) 180 MG tablet Take 180 mg by mouth daily.       . furosemide (LASIX) 40 MG tablet TAKE ONE TABLET BY MOUTH TWICE DAILY  60 tablet  12  . KLOR-CON M20 20 MEQ tablet TAKE ONE TABLET BY MOUTH TWICE DAILY  60 each  5  . LANOXIN 0.125 MG tablet TAKE ONE TABLET BY MOUTH EVERY DAY  30 each  1  . metFORMIN (GLUCOPHAGE) 500 MG tablet TAKE ONE TABLET BY MOUTH EVERY DAY  30 tablet  6  . metolazone (ZAROXOLYN) 2.5 MG tablet TAKE ONE TABLET BY MOUTH EVERY MORNING ON MONDAY, WEDNESDAY, AND FRIDAY 30 MINUTES BEFORE TAKING FUROSEMIDE.  30 tablet  6  . metoprolol (LOPRESSOR) 50 MG tablet TAKE ONE TABLET BY MOUTH TWICE DAILY  60 tablet  5  . promethazine (PHENERGAN) 12.5 MG tablet TAKE ONE TABLET BY MOUTH EVERY 6 HOURS AS NEEDED FOR NAUSEA  30 tablet  1  . simvastatin (ZOCOR) 20 MG tablet Take 1 tablet (20 mg total) by mouth at bedtime.  90 tablet  3  . temazepam (RESTORIL) 15 MG capsule TAKE ONE  CAPSULE BY MOUTH AT BEDTIME  30 capsule  5  . traZODone (DESYREL) 50 MG tablet TAKE ONE TO TWO TABLETS BY MOUTH AT BEDTIME AS NEEDED  30 tablet  5  . clonazePAM (KLONOPIN) 0.5 MG tablet Take 0.5 mg by mouth 2 (two) times daily as needed.         Review of Systems Review of Systems  Constitutional: Negative for diaphoresis and unexpected weight change.  HENT: Negative for drooling and tinnitus.   Eyes: Negative for photophobia and visual disturbance.  Respiratory: Negative for choking and stridor.   Gastrointestinal: Negative for vomiting and blood in stool.  Genitourinary: Negative for hematuria and decreased urine volume.    Objective:   Physical Exam BP 142/78  Pulse 101  Temp(Src) 97.5 F (36.4 C) (Oral)  SpO2 92% Physical Exam  VS noted Constitutional: Pt appears well-developed and well-nourished.  HENT: Head: Normocephalic.  Right Ear: External ear normal.  Left Ear: External ear normal.  Eyes: Conjunctivae and EOM are normal. Pupils are equal, round, and reactive to light.  Neck: Normal range of motion. Neck supple.  Cardiovascular: Normal rate and regular rhythm.   Pulmonary/Chest: Effort normal and breath sounds normal.  Abd:  Soft, NT, non-distended, + BS Neurological: Pt is alert. No gross motor defecit Skin: Skin is warm. No erythema. No rash Spine nontender + diffuse tender to large area of right flank, no specific erythema/ swelling Psychiatric: Pt behavior is normal. Thought content normal.     Assessment & Plan:

## 2011-11-02 NOTE — Patient Instructions (Addendum)
Please go to XRAY in the Basement for the x-ray test Please go to LAB in the Basement for the blood and/or urine tests to be done today as you requested You will be contacted by phone if any changes need to be made immediately.  Otherwise, you will receive a letter about your results with an explanation. Continue all other medications as before, includiing the hydrocodone you already have at home

## 2011-11-02 NOTE — Assessment & Plan Note (Signed)
Etiology unclear, Exam otherwise benign, to check labs as documented per daughter's specific request, follow with expectant management

## 2011-11-03 ENCOUNTER — Other Ambulatory Visit: Payer: Self-pay | Admitting: Internal Medicine

## 2011-11-03 ENCOUNTER — Encounter: Payer: Self-pay | Admitting: Internal Medicine

## 2011-11-03 MED ORDER — POTASSIUM CHLORIDE CRYS ER 20 MEQ PO TBCR: EXTENDED_RELEASE_TABLET | ORAL | Status: DC

## 2011-11-07 ENCOUNTER — Telehealth: Payer: Self-pay | Admitting: Family Medicine

## 2011-11-07 NOTE — Telephone Encounter (Signed)
D/w daughter that we do not bind rib fx, d/w her  rib management treatment plan.

## 2011-11-26 ENCOUNTER — Other Ambulatory Visit: Payer: Self-pay | Admitting: Internal Medicine

## 2011-11-26 NOTE — Telephone Encounter (Signed)
Metformin sent per emr  Controlled meds - Done hardcopy to robin  Robin to note to pt/family - should have f/u eval with dr Debby Bud for pain if still need hydrocodone prn after this

## 2011-11-26 NOTE — Telephone Encounter (Signed)
Faxed hardcopy to pharmacy. Informed the patient of MD's instructions.

## 2011-11-26 NOTE — Telephone Encounter (Signed)
MD is out of office. Is this ok to refill clonazepam?... 11/26/11@12 :17pm/LMB

## 2011-12-01 ENCOUNTER — Telehealth: Payer: Self-pay | Admitting: Internal Medicine

## 2011-12-01 DIAGNOSIS — M79609 Pain in unspecified limb: Secondary | ICD-10-CM

## 2011-12-01 DIAGNOSIS — D649 Anemia, unspecified: Secondary | ICD-10-CM

## 2011-12-01 NOTE — Telephone Encounter (Signed)
Order entered for CBC

## 2011-12-01 NOTE — Telephone Encounter (Signed)
Son/Gregg says pt is weak and nausea.  Son is requesting labs because he thinks this may have something to do with pt's hemoglobin--Greg ph # [phone]/cell

## 2011-12-02 ENCOUNTER — Other Ambulatory Visit (INDEPENDENT_AMBULATORY_CARE_PROVIDER_SITE_OTHER): Payer: Medicare Other

## 2011-12-02 DIAGNOSIS — D649 Anemia, unspecified: Secondary | ICD-10-CM

## 2011-12-02 LAB — CBC WITH DIFFERENTIAL/PLATELET
Basophils Absolute: 0 10*3/uL (ref 0.0–0.1)
Basophils Relative: 0.4 % (ref 0.0–3.0)
Eosinophils Absolute: 0.2 10*3/uL (ref 0.0–0.7)
Hemoglobin: 8.3 g/dL — ABNORMAL LOW (ref 12.0–15.0)
Lymphocytes Relative: 14.1 % (ref 12.0–46.0)
Monocytes Relative: 8 % (ref 3.0–12.0)
Neutro Abs: 3.6 10*3/uL (ref 1.4–7.7)
Neutrophils Relative %: 74.3 % (ref 43.0–77.0)
RBC: 2.77 Mil/uL — ABNORMAL LOW (ref 3.87–5.11)
RDW: 17.2 % — ABNORMAL HIGH (ref 11.5–14.6)

## 2011-12-02 NOTE — Telephone Encounter (Signed)
Pt's son advised.

## 2011-12-02 NOTE — Telephone Encounter (Signed)
i dont't know how to change it to stat but that is ok with me - it will still take about an hour

## 2011-12-02 NOTE — Telephone Encounter (Signed)
Pt's son has been advised of order but is requesting it be ordered as STAT, please advise.

## 2011-12-07 ENCOUNTER — Telehealth: Payer: Self-pay

## 2011-12-07 DIAGNOSIS — D649 Anemia, unspecified: Secondary | ICD-10-CM

## 2011-12-07 NOTE — Telephone Encounter (Signed)
If she has an appointment this week with Dr. Arbutus Ped that should be the first step. If she doesn't have an appointment we can get a repeat Hemoglobin (CBC) today or tomorrow - last CBC 4/24 with Hgb 8.3. Order entered.

## 2011-12-07 NOTE — Telephone Encounter (Signed)
Pt's son called stating pt is still weak, nausea and has no energy. Son is request recommendation or treatment form [facility], please advise.

## 2011-12-07 NOTE — Telephone Encounter (Signed)
Pt's son advised, pt has appt with Dr Shirline Frees 05/06 and he will discuss with MD at that time.

## 2011-12-08 ENCOUNTER — Encounter (HOSPITAL_COMMUNITY): Payer: Self-pay

## 2011-12-08 ENCOUNTER — Inpatient Hospital Stay (HOSPITAL_COMMUNITY)
Admission: EM | Admit: 2011-12-08 | Discharge: 2011-12-17 | DRG: 639 | Disposition: A | Discharge status: Skilled Nursing Facility | Payer: Medicare Other | Attending: Internal Medicine | Admitting: Internal Medicine

## 2011-12-08 ENCOUNTER — Emergency Department (HOSPITAL_COMMUNITY): Payer: Medicare Other

## 2011-12-08 ENCOUNTER — Telehealth: Payer: Self-pay | Admitting: *Deleted

## 2011-12-08 DIAGNOSIS — I4891 Unspecified atrial fibrillation: Secondary | ICD-10-CM | POA: Diagnosis present

## 2011-12-08 DIAGNOSIS — R739 Hyperglycemia, unspecified: Secondary | ICD-10-CM

## 2011-12-08 DIAGNOSIS — J449 Chronic obstructive pulmonary disease, unspecified: Secondary | ICD-10-CM | POA: Diagnosis present

## 2011-12-08 DIAGNOSIS — R5381 Other malaise: Secondary | ICD-10-CM | POA: Diagnosis present

## 2011-12-08 DIAGNOSIS — D509 Iron deficiency anemia, unspecified: Secondary | ICD-10-CM | POA: Diagnosis present

## 2011-12-08 DIAGNOSIS — E1101 Type 2 diabetes mellitus with hyperosmolarity with coma: Principal | ICD-10-CM | POA: Diagnosis present

## 2011-12-08 DIAGNOSIS — I10 Essential (primary) hypertension: Secondary | ICD-10-CM

## 2011-12-08 DIAGNOSIS — J45909 Unspecified asthma, uncomplicated: Secondary | ICD-10-CM | POA: Diagnosis present

## 2011-12-08 DIAGNOSIS — J4489 Other specified chronic obstructive pulmonary disease: Secondary | ICD-10-CM | POA: Diagnosis present

## 2011-12-08 DIAGNOSIS — E1100 Type 2 diabetes mellitus with hyperosmolarity without nonketotic hyperglycemic-hyperosmolar coma (NKHHC): Secondary | ICD-10-CM

## 2011-12-08 DIAGNOSIS — D649 Anemia, unspecified: Secondary | ICD-10-CM

## 2011-12-08 DIAGNOSIS — E119 Type 2 diabetes mellitus without complications: Secondary | ICD-10-CM

## 2011-12-08 DIAGNOSIS — R5383 Other fatigue: Secondary | ICD-10-CM | POA: Diagnosis present

## 2011-12-08 DIAGNOSIS — I2510 Atherosclerotic heart disease of native coronary artery without angina pectoris: Secondary | ICD-10-CM | POA: Diagnosis present

## 2011-12-08 LAB — DIFFERENTIAL
Basophils Absolute: 0 10*3/uL (ref 0.0–0.1)
Lymphocytes Relative: 14 % (ref 12–46)
Lymphs Abs: 0.8 10*3/uL (ref 0.7–4.0)
Neutro Abs: 4.3 10*3/uL (ref 1.7–7.7)
Neutrophils Relative %: 73 % (ref 43–77)

## 2011-12-08 LAB — HEPATIC FUNCTION PANEL
AST: 15 U/L (ref 0–37)
Albumin: 3.3 g/dL — ABNORMAL LOW (ref 3.5–5.2)
Albumin: 3.3 g/dL — ABNORMAL LOW (ref 3.5–5.2)
Alkaline Phosphatase: 100 U/L (ref 39–117)
Bilirubin, Direct: 0.2 mg/dL (ref 0.0–0.3)
Total Bilirubin: 0.4 mg/dL (ref 0.3–1.2)
Total Protein: 6.4 g/dL (ref 6.0–8.3)

## 2011-12-08 LAB — CBC
HCT: 24.3 % — ABNORMAL LOW (ref 36.0–46.0)
Hemoglobin: 7.8 g/dL — ABNORMAL LOW (ref 12.0–15.0)
MCH: 30 pg (ref 26.0–34.0)
MCHC: 32.1 g/dL (ref 30.0–36.0)
MCV: 94.3 fL (ref 78.0–100.0)
MCV: 93.5 fL (ref 78.0–100.0)
Platelets: 206 10*3/uL (ref 150–400)
RBC: 3 MIL/uL — ABNORMAL LOW (ref 3.87–5.11)
RDW: 16.9 % — ABNORMAL HIGH (ref 11.5–15.5)
WBC: 5.9 10*3/uL (ref 4.0–10.5)

## 2011-12-08 LAB — CARDIAC PANEL(CRET KIN+CKTOT+MB+TROPI)
CK, MB: 1.8 ng/mL (ref 0.3–4.0)
Relative Index: INVALID (ref 0.0–2.5)
Total CK: 26 U/L (ref 7–177)
Troponin I: <0.3 ng/mL (ref <0.30)
Troponin I: <0.3 ng/mL (ref <0.30)

## 2011-12-08 LAB — MAGNESIUM
Magnesium: 2 mg/dL (ref 1.5–2.5)
Magnesium: 2.1 mg/dL (ref 1.5–2.5)

## 2011-12-08 LAB — BASIC METABOLIC PANEL
CO2: 28 mEq/L (ref 19–32)
Calcium: 9.5 mg/dL (ref 8.4–10.5)
Chloride: 89 mEq/L — ABNORMAL LOW (ref 96–112)
Glucose, Bld: 672 mg/dL (ref 70–99)
Potassium: 4.5 mEq/L (ref 3.5–5.1)
Sodium: 129 mEq/L — ABNORMAL LOW (ref 135–145)

## 2011-12-08 LAB — URINE MICROSCOPIC-ADD ON

## 2011-12-08 LAB — URINALYSIS, ROUTINE W REFLEX MICROSCOPIC
Bilirubin Urine: NEGATIVE
Nitrite: NEGATIVE
Specific Gravity, Urine: 1.017 (ref 1.005–1.030)
Urobilinogen, UA: 0.2 mg/dL (ref 0.0–1.0)
pH: 7 (ref 5.0–8.0)

## 2011-12-08 LAB — AMYLASE: Amylase: 41 U/L (ref 0–105)

## 2011-12-08 LAB — POCT I-STAT, CHEM 8
Chloride: 99 mEq/L (ref 96–112)
HCT: 25 % — ABNORMAL LOW (ref 36.0–46.0)
Hemoglobin: 8.5 g/dL — ABNORMAL LOW (ref 12.0–15.0)
Potassium: 3.6 mEq/L (ref 3.5–5.1)
Sodium: 136 mEq/L (ref 135–145)

## 2011-12-08 LAB — GLUCOSE, CAPILLARY
Glucose-Capillary: >600 mg/dL (ref 70–99)
Glucose-Capillary: 223 mg/dL — ABNORMAL HIGH (ref 70–99)
Glucose-Capillary: 148 mg/dL — ABNORMAL HIGH (ref 70–99)

## 2011-12-08 LAB — LACTIC ACID, PLASMA: Lactic Acid, Venous: 2 mmol/L (ref 0.5–2.2)

## 2011-12-08 LAB — LIPASE, BLOOD: Lipase: 74 U/L — ABNORMAL HIGH (ref 11–59)

## 2011-12-08 MED ORDER — ACETAMINOPHEN 650 MG RE SUPP: 650.0000 mg | Freq: Four times a day (QID) | RECTAL | Status: DC | PRN

## 2011-12-08 MED ORDER — TEMAZEPAM 7.5 MG PO CAPS
7.5000 mg | ORAL_CAPSULE | Freq: Every day | ORAL | Status: DC
  Administered 2011-12-08 – 2011-12-16 (×9): 7.5 mg via ORAL
  Filled 2011-12-08 (×9): qty 1

## 2011-12-08 MED ORDER — ONDANSETRON HCL 4 MG PO TABS: 4.0000 mg | ORAL_TABLET | Freq: Four times a day (QID) | ORAL | Status: DC | PRN

## 2011-12-08 MED ORDER — DEXTROSE 50 % IV SOLN
25.0000 mL | INTRAVENOUS | Status: DC | PRN
  Filled 2011-12-08: qty 50

## 2011-12-08 MED ORDER — LORAZEPAM 2 MG/ML IJ SOLN
1.0000 mg | Freq: Once | INTRAMUSCULAR | Status: AC
  Administered 2011-12-08: 1 mg via INTRAVENOUS
  Filled 2011-12-08: qty 1

## 2011-12-08 MED ORDER — METOPROLOL TARTRATE 12.5 MG HALF TABLET
12.5000 mg | ORAL_TABLET | Freq: Two times a day (BID) | ORAL | Status: DC
  Administered 2011-12-08 – 2011-12-17 (×18): 12.5 mg via ORAL
  Filled 2011-12-08 (×21): qty 1

## 2011-12-08 MED ORDER — SODIUM CHLORIDE 0.9 % IV SOLN
INTRAVENOUS | Status: DC
  Administered 2011-12-08: 2.8 [IU]/h via INTRAVENOUS
  Filled 2011-12-08: qty 1

## 2011-12-08 MED ORDER — CLONAZEPAM 0.5 MG PO TABS
0.2500 mg | ORAL_TABLET | Freq: Two times a day (BID) | ORAL | Status: DC | PRN
  Administered 2011-12-10 – 2011-12-16 (×6): 0.25 mg via ORAL
  Filled 2011-12-08 (×7): qty 1

## 2011-12-08 MED ORDER — SODIUM CHLORIDE 0.9 % IV SOLN: Freq: Once | INTRAVENOUS | Status: DC

## 2011-12-08 MED ORDER — LEVALBUTEROL HCL 0.63 MG/3ML IN NEBU
0.6300 mg | INHALATION_SOLUTION | Freq: Four times a day (QID) | RESPIRATORY_TRACT | Status: DC
  Administered 2011-12-08 – 2011-12-09 (×2): 0.63 mg via RESPIRATORY_TRACT
  Filled 2011-12-08 (×8): qty 3

## 2011-12-08 MED ORDER — ONDANSETRON HCL 4 MG/2ML IJ SOLN
4.0000 mg | Freq: Four times a day (QID) | INTRAMUSCULAR | Status: DC | PRN
  Administered 2011-12-10 – 2011-12-17 (×10): 4 mg via INTRAVENOUS
  Filled 2011-12-08 (×10): qty 2

## 2011-12-08 MED ORDER — SODIUM CHLORIDE 0.9 % IJ SOLN
3.0000 mL | Freq: Two times a day (BID) | INTRAMUSCULAR | Status: DC
  Administered 2011-12-09 – 2011-12-16 (×17): 3 mL via INTRAVENOUS

## 2011-12-08 MED ORDER — HYDROCODONE-ACETAMINOPHEN 5-325 MG PO TABS
1.0000 | ORAL_TABLET | ORAL | Status: DC | PRN
  Administered 2011-12-08 – 2011-12-10 (×2): 1 via ORAL
  Administered 2011-12-10 (×2): 2 via ORAL
  Administered 2011-12-11: 1 via ORAL
  Administered 2011-12-12 – 2011-12-13 (×3): 2 via ORAL
  Administered 2011-12-13: 1 via ORAL
  Administered 2011-12-14 – 2011-12-17 (×8): 2 via ORAL
  Filled 2011-12-08: qty 1
  Filled 2011-12-08 (×2): qty 2
  Filled 2011-12-08: qty 1
  Filled 2011-12-08 (×7): qty 2
  Filled 2011-12-08: qty 1
  Filled 2011-12-08 (×2): qty 2
  Filled 2011-12-08: qty 1
  Filled 2011-12-08: qty 2
  Filled 2011-12-08: qty 1
  Filled 2011-12-08 (×2): qty 2

## 2011-12-08 MED ORDER — INSULIN ASPART 100 UNIT/ML ~~LOC~~ SOLN: 8.0000 [IU] | Freq: Once | SUBCUTANEOUS | Status: DC

## 2011-12-08 MED ORDER — SODIUM CHLORIDE 0.9 % IV SOLN
Freq: Once | INTRAVENOUS | Status: AC
  Administered 2011-12-08: 14:00:00 via INTRAVENOUS

## 2011-12-08 MED ORDER — ACETAMINOPHEN 325 MG PO TABS
650.0000 mg | ORAL_TABLET | Freq: Four times a day (QID) | ORAL | Status: DC | PRN
  Administered 2011-12-08 – 2011-12-14 (×3): 650 mg via ORAL
  Filled 2011-12-08 (×3): qty 2

## 2011-12-08 MED ORDER — SODIUM CHLORIDE 0.9 % IV SOLN
INTRAVENOUS | Status: DC
  Administered 2011-12-08 – 2011-12-09 (×3): via INTRAVENOUS
  Administered 2011-12-09: 1000 mL via INTRAVENOUS
  Administered 2011-12-10: 04:00:00 via INTRAVENOUS

## 2011-12-08 MED ORDER — DIGOXIN 125 MCG PO TABS
0.1250 mg | ORAL_TABLET | Freq: Every day | ORAL | Status: DC
  Administered 2011-12-09 – 2011-12-17 (×9): 0.125 mg via ORAL
  Filled 2011-12-08 (×9): qty 1

## 2011-12-08 MED ORDER — INSULIN REGULAR BOLUS VIA INFUSION
0.0000 [IU] | Freq: Three times a day (TID) | INTRAVENOUS | Status: DC
  Filled 2011-12-08: qty 10

## 2011-12-08 MED ORDER — LORAZEPAM 2 MG/ML IJ SOLN
0.5000 mg | Freq: Once | INTRAMUSCULAR | Status: AC
  Administered 2011-12-08: 0.5 mg via INTRAVENOUS
  Filled 2011-12-08: qty 1

## 2011-12-08 MED ORDER — INSULIN ASPART 100 UNIT/ML ~~LOC~~ SOLN
8.0000 [IU] | Freq: Once | SUBCUTANEOUS | Status: AC
  Administered 2011-12-08: 8 [IU] via SUBCUTANEOUS
  Filled 2011-12-08: qty 1

## 2011-12-08 MED ORDER — INSULIN REGULAR HUMAN 100 UNIT/ML IJ SOLN: 8.0000 [IU] | Freq: Once | INTRAMUSCULAR | Status: DC

## 2011-12-08 MED ORDER — ENOXAPARIN SODIUM 40 MG/0.4ML ~~LOC~~ SOLN
40.0000 mg | SUBCUTANEOUS | Status: DC
  Administered 2011-12-08 – 2011-12-16 (×9): 40 mg via SUBCUTANEOUS
  Filled 2011-12-08 (×10): qty 0.4

## 2011-12-08 NOTE — ED Provider Notes (Signed)
Patient signed out to me by Dr. Golda Acre and repeat blood sugar remains elevated. Patient to be admitted by [facility] hospitalist  Toy Baker, MD 12/08/11 [phone]

## 2011-12-08 NOTE — Telephone Encounter (Signed)
Pt's son called stating that pt is nauseated, extremely fatigued, and is unable to eat.  He does not think he would be able to get her out of bed to get her to the MD office, per Dr Donnald Garre, pt needs to go to the ED for evaluation.  Tammy Sours verbalized understanding.  SLJ

## 2011-12-08 NOTE — ED Notes (Signed)
[id number]<AV> Expected date:<BR> Expected time: 1:04 PM<BR> Means of arrival:Ambulance<BR> Comments:<BR> M80 -- Hyperglycemia (&gt;600)

## 2011-12-08 NOTE — ED Notes (Signed)
[facility]- pt reports presenting complaints 2 days ago. Per daughter pt has not felt well for several days compliant with meds- pt is warm to touch

## 2011-12-08 NOTE — ED Provider Notes (Addendum)
History     CSN: [id number]  Arrival date & time 12/08/11  1320   First MD Initiated Contact with Patient 12/08/11 1408      Chief Complaint  Patient presents with  . Nausea  . Diabetes  . Fever    (Consider location/radiation/quality/duration/timing/severity/associated sxs/prior treatment) HPI Comments: Patient presents with the daughter for generalized weakness.  The daughter gives most of the history at this time.  While the patient does live independently the son and daughter alternate when they stay with her so the majority of the time the patient is not alone.  Over the last 2 days the patient has had an increase in generalized weakness with worsening of her chronic nausea.  There's been no specific fevers, chest pain or shortness of breath.  The patient has just not felt well although she has no focal areas of pain.  The family members encouraged the patient to come in and she was resistant to coming to the emergency department today.  Per the report the patient has been taking all of her diabetic medications as directed despite the blood sugar by EMS of greater than 600.  Patient has had decreased appetite and decreased by mouth intake over the last 2 days.  No reports of dysuria or hematuria.  No diarrhea.  No focal abdominal pain.  Patient is otherwise at her mental status baseline.  The daughter does note the patient has chronic issues with nausea over the last one to 2 years and usually takes an anti-emetic twice a day.  Patient is a 78 y.o. female presenting with weakness. The history is provided by the patient and a relative. No language interpreter was used.  Weakness The primary symptoms include nausea. Primary symptoms do not include headaches, fever or vomiting.  Additional symptoms include weakness.    Past Medical History  Diagnosis Date  . Inguinal hernia   . Mitral valve insufficiency and aortic valve insufficiency   . Other and unspecified hyperlipidemia   .  Pneumonia   . Osteoarthrosis, unspecified whether generalized or localized, unspecified site   . Nephrolithiasis   . Unspecified essential hypertension   . Type II or unspecified type diabetes mellitus without mention of complication, not stated as uncontrolled   . Coronary atherosclerosis of unspecified type of vessel, native or graft   . Congestive heart failure, unspecified   . Atrial fibrillation   . Unspecified asthma   . Anemia, unspecified   . Allergic rhinitis, cause unspecified   . Colon polyps 10/2009    Past Surgical History  Procedure Date  . Breast cyst aspiration     x50  . Abdominal hysterectomy 1990  . Lumbar fusion 2007    [facility]  . Total hip arthroplasty 01/2007    Right - Dr Valentina Gu  . Inguinal hernia repair 2010    [facility]    Family History  Problem Relation Age of Onset  . Heart disease Father     CHF  . Lymphoma Sister   . Colon cancer Neg Hx     History  Substance Use Topics  . Smoking status: Never Smoker   . Smokeless tobacco: Not on file  . Alcohol Use: No    OB History    Grav Para Term Preterm Abortions TAB SAB Ect Mult Living                  Review of Systems  Constitutional: Negative for fever and chills.  HENT: Negative.   Eyes: Negative.  Negative for discharge and redness.  Respiratory: Negative.  Negative for cough and shortness of breath.   Cardiovascular: Negative.  Negative for chest pain.  Gastrointestinal: Positive for nausea. Negative for vomiting, abdominal pain and diarrhea.  Genitourinary: Negative.  Negative for dysuria and vaginal discharge.  Musculoskeletal: Negative.  Negative for back pain.  Skin: Negative.  Negative for color change and rash.  Neurological: Positive for weakness. Negative for syncope and headaches.  Hematological: Negative.  Negative for adenopathy.  Psychiatric/Behavioral: Negative.  Negative for confusion.  All other systems reviewed and are negative.    Allergies  Codeine  Home  Medications   Current Outpatient Rx  Name Route Sig Dispense Refill  . AVAPRO 300 MG PO TABS  TAKE ONE TABLET BY MOUTH EVERY DAY 90 each 3  . CLONAZEPAM 0.5 MG PO TABS  TAKE ONE TABLET BY MOUTH TWICE DAILY 60 tablet 1  . DOXAZOSIN MESYLATE 4 MG PO TABS  TAKE ONE TABLET BY MOUTH AT BEDTIME FOR BLOOD PRESSURE. 30 tablet 5  . ESOMEPRAZOLE MAGNESIUM 40 MG PO CPDR Oral Take 40 mg by mouth every morning.      . INTEGRA F 125-1 MG PO CAPS Oral Take 1 capsule by mouth daily.      . INTEGRA PLUS PO CAPS  TAKE ONE CAPSULE BY MOUTH EVERY DAY. 30 each 2  . FEXOFENADINE HCL 180 MG PO TABS Oral Take 180 mg by mouth daily.     . FUROSEMIDE 40 MG PO TABS  TAKE ONE TABLET BY MOUTH TWICE DAILY 60 tablet 12  . HYDROCODONE-ACETAMINOPHEN 5-325 MG PO TABS  TAKE ONE TABLET BY MOUTH EVERY 6 HOURS AS NEEDED 60 tablet 1    Please make return office visit with Dr Debby Bud for ...  . LANOXIN 0.125 MG PO TABS  TAKE ONE TABLET BY MOUTH EVERY DAY 30 each 1    Dispense as written.  Marland Kitchen METFORMIN HCL 500 MG PO TABS  TAKE ONE TABLET BY MOUTH EVERY DAY 90 tablet 3  . METOLAZONE 2.5 MG PO TABS  TAKE ONE TABLET BY MOUTH EVERY MORNING ON MONDAY, WEDNESDAY, AND FRIDAY 30 MINUTES BEFORE TAKING FUROSEMIDE. 30 tablet 6  . METOPROLOL TARTRATE 50 MG PO TABS  TAKE ONE TABLET BY MOUTH TWICE DAILY 60 tablet 5  . POTASSIUM CHLORIDE CRYS ER 20 MEQ PO TBCR  1 tab by mouth three times per day 90 tablet 5  . PROMETHAZINE HCL 12.5 MG PO TABS  TAKE ONE TABLET BY MOUTH EVERY 6 HOURS AS NEEDED FOR NAUSEA 30 tablet 1  . SIMVASTATIN 20 MG PO TABS Oral Take 1 tablet (20 mg total) by mouth at bedtime. 90 tablet 3  . TEMAZEPAM 15 MG PO CAPS  TAKE ONE CAPSULE BY MOUTH AT BEDTIME 30 capsule 5  . TRAZODONE HCL 50 MG PO TABS  TAKE ONE TO TWO TABLETS BY MOUTH AT BEDTIME AS NEEDED 30 tablet 5    BP 140/43  Pulse 66  Temp(Src) 98.2 F (36.8 C) (Oral)  Resp 18  SpO2 96%  Physical Exam  Nursing note and vitals reviewed. Constitutional: She is oriented to  person, place, and time. She appears well-developed and well-nourished.  Non-toxic appearance. She does not have a sickly appearance.  HENT:  Head: Normocephalic and atraumatic.  Eyes: Conjunctivae, EOM and lids are normal. Pupils are equal, round, and reactive to light. No scleral icterus.  Neck: Trachea normal and normal range of motion. Neck supple.  Cardiovascular: Normal rate, regular rhythm and normal  heart sounds.  Exam reveals no gallop and no friction rub.   No murmur heard. Pulmonary/Chest: Effort normal. No respiratory distress. She has no wheezes. She has no rales. She exhibits no tenderness.  Abdominal: Soft. Normal appearance. There is no tenderness. There is no rebound, no guarding and no CVA tenderness.  Musculoskeletal: Normal range of motion.  Neurological: She is alert and oriented to person, place, and time. She has normal strength.  Skin: Skin is warm, dry and intact. No rash noted.  Psychiatric: She has a normal mood and affect. Her behavior is normal. Judgment and thought content normal.    ED Course  Procedures (including critical care time)  Results for orders placed during the hospital encounter of 12/08/11  URINALYSIS, ROUTINE W REFLEX MICROSCOPIC      Component Value Range   Color, Urine YELLOW  YELLOW    APPearance CLEAR  CLEAR    Specific Gravity, Urine 1.017  1.005 - 1.030    pH 7.0  5.0 - 8.0    Glucose, UA >1000 (*) NEGATIVE (mg/dL)   Hgb urine dipstick NEGATIVE  NEGATIVE    Bilirubin Urine NEGATIVE  NEGATIVE    Ketones, ur NEGATIVE  NEGATIVE (mg/dL)   Protein, ur NEGATIVE  NEGATIVE (mg/dL)   Urobilinogen, UA 0.2  0.0 - 1.0 (mg/dL)   Nitrite NEGATIVE  NEGATIVE    Leukocytes, UA NEGATIVE  NEGATIVE   GLUCOSE, CAPILLARY      Component Value Range   Glucose-Capillary >600 (*) 70 - 99 (mg/dL)  CBC      Component Value Range   WBC 5.9  4.0 - 10.5 (K/uL)   RBC 3.00 (*) 3.87 - 5.11 (MIL/uL)   Hemoglobin 9.0 (*) 12.0 - 15.0 (g/dL)   HCT 40.9 (*) 81.1 -  46.0 (%)   MCV 94.3  78.0 - 100.0 (fL)   MCH 30.0  26.0 - 34.0 (pg)   MCHC 31.8  30.0 - 36.0 (g/dL)   RDW 91.4 (*) 78.2 - 15.5 (%)   Platelets 206  150 - 400 (K/uL)  DIFFERENTIAL      Component Value Range   Neutrophils Relative 73  43 - 77 (%)   Neutro Abs 4.3  1.7 - 7.7 (K/uL)   Lymphocytes Relative 14  12 - 46 (%)   Lymphs Abs 0.8  0.7 - 4.0 (K/uL)   Monocytes Relative 8  3 - 12 (%)   Monocytes Absolute 0.5  0.1 - 1.0 (K/uL)   Eosinophils Relative 4  0 - 5 (%)   Eosinophils Absolute 0.3  0.0 - 0.7 (K/uL)   Basophils Relative 1  0 - 1 (%)   Basophils Absolute 0.0  0.0 - 0.1 (K/uL)  BASIC METABOLIC PANEL      Component Value Range   Sodium 129 (*) 135 - 145 (mEq/L)   Potassium 4.5  3.5 - 5.1 (mEq/L)   Chloride 89 (*) 96 - 112 (mEq/L)   CO2 28  19 - 32 (mEq/L)   Glucose, Bld 672 (*) 70 - 99 (mg/dL)   BUN 62 (*) 6 - 23 (mg/dL)   Creatinine, Ser 9.56  0.50 - 1.10 (mg/dL)   Calcium 9.5  8.4 - 21.3 (mg/dL)   GFR calc non Af Amer 54 (*) >90 (mL/min)   GFR calc Af Amer 62 (*) >90 (mL/min)  CARDIAC PANEL(CRET KIN+CKTOT+MB+TROPI)      Component Value Range   Total CK 26  7 - 177 (U/L)   CK, MB 1.8  0.3 -  4.0 (ng/mL)   Troponin I <0.30  <0.30 (ng/mL)   Relative Index RELATIVE INDEX IS INVALID  0.0 - 2.5   URINE MICROSCOPIC-ADD ON      Component Value Range   Squamous Epithelial / LPF RARE  RARE    Bacteria, UA RARE  RARE    Dg Chest 2 View  12/08/2011  *RADIOLOGY REPORT*  Clinical Data: Hyperglycemia, nausea, history CHF, atrial fibrillation, diabetes, valvular heart disease, coronary artery disease  CHEST - 2 VIEW  Comparison: 11/02/2011  Findings: Enlargement of cardiac silhouette with pulmonary vascular congestion. Atherosclerotic calcification aorta. Minimal prominence of superior mediastinal soft tissues likely accentuated by lordotic technique. No definite acute failure or consolidation. Mild congestive changes seen on previous exam appears slightly improved. Emphysematous  changes with minimal atelectasis right base. Bones demineralized. No pneumothorax.  IMPRESSION: Enlargement of cardiac silhouette with pulmonary vascular congestion. Emphysematous changes with minimal right basilar atelectasis. No acute infiltrate or failure identified.  Original Report Authenticated By: Lollie Marrow, M.D.       Date: 12/08/2011  Rate: 71  Rhythm: atrial fibrillation  QRS Axis: normal  Intervals: normal  ST/T Wave abnormalities: normal  Conduction Disutrbances:none  Narrative Interpretation:   Old EKG Reviewed: unchanged from 02/13/10    MDM  Patient with hyperglycemia on initial evaluation and so her require further laboratory studies to rule out DKA or a hyperosmolar state.  Patient has had chronic issues with nausea but we will continue to treat her nausea here as well.  Her nausea may be exacerbated by her abnormal sugars or due to dehydration due to decreased appetite over the last few days.  Gross consider other possible inciting factors for hyperglycemia including MI, pneumonia or UTI.  Nat Christen, MD 12/08/11 [phone]   Patient with no acute signs of infection or MI as a cause for her hyperglycemia.  She's not in DKA at this time.  Patient has only received 500 mL's of normal saline so she'll receive one more liter of normal saline we will do a recheck on her sugar.  If her sugar is still significantly elevated she will likely require admission for continued hydration and monitoring of her sugars overnight.  If she is showing a decreased glucose here with IV hydration she may be able to be discharged home with followup with Dr. Debby Bud later this week.  Nat Christen, MD 12/08/11 [phone]

## 2011-12-08 NOTE — H&P (Signed)
[name] MRN: [id number] DOB/AGE: 13-Mar-1920 78 y.o. Primary Care Physician:Michael Norins, MD, MD Admit date: 12/08/2011 Nausea   .  Diabetes   .  Fever    HPI Comments: Patient presents with the daughter for generalized weakness. The daughter gives most of the history at this time. While the patient does live independently the son and daughter alternate when they stay with her so the majority of the time the patient is not alone. Over the last 2 days the patient has had an increase in generalized weakness with worsening of her chronic nausea. There's been no specific fevers, chest pain or shortness of breath. The patient has just not felt well although she has no focal areas of pain.Pt's son called  PCP stating that pt is nauseated, extremely fatigued, and is unable to eat. He does not think he would be able to get her out of bed to get her to the MD office. No recent falls.    Per the report the patient has been taking all of her diabetic medications as directed despite the blood sugar by EMS of greater than 600. Patient has had decreased appetite and decreased by mouth intake over the last 2 days. No reports of dysuria or hematuria. No diarrhea. No focal abdominal pain. Patient is otherwise at her mental status baseline. The daughter does note the patient has chronic issues with nausea over the last one to 2 years and usually takes an anti-emetic twice a day.   .     Past Medical History  Diagnosis Date  . Inguinal hernia   . Mitral valve insufficiency and aortic valve insufficiency   . Other and unspecified hyperlipidemia   . Pneumonia   . Osteoarthrosis, unspecified whether generalized or localized, unspecified site   . Nephrolithiasis   . Unspecified essential hypertension   . Type II or unspecified type diabetes mellitus without mention of complication, not stated as uncontrolled   . Coronary atherosclerosis of unspecified type of vessel, native or graft   . Congestive heart  failure, unspecified   . Atrial fibrillation   . Unspecified asthma   . Anemia, unspecified   . Allergic rhinitis, cause unspecified   . Colon polyps 10/2009    Past Surgical History  Procedure Date  . Breast cyst aspiration     x50  . Abdominal hysterectomy 1990  . Lumbar fusion 2007    [facility]  . Total hip arthroplasty 01/2007    Right - Dr Valentina Gu  . Inguinal hernia repair 2010    [facility]    Prior to Admission medications   Medication Sig Start Date End Date Taking? Authorizing Provider  AVAPRO 300 MG tablet TAKE ONE TABLET BY MOUTH EVERY DAY 01/29/11  Yes Jacques Navy, MD  clonazePAM (KLONOPIN) 0.5 MG tablet TAKE ONE TABLET BY MOUTH TWICE DAILY 11/26/11  Yes Corwin Levins, MD  doxazosin (CARDURA) 4 MG tablet TAKE ONE TABLET BY MOUTH AT BEDTIME FOR BLOOD PRESSURE. 08/26/11  Yes Jacques Navy, MD  Fe Fum-FePoly-FA-Vit C-Vit B3 (INTEGRA F) 125-1 MG CAPS Take 1 capsule by mouth at bedtime.    Yes Historical Provider, MD  fexofenadine (ALLEGRA) 180 MG tablet Take 180 mg by mouth daily.    Yes Historical Provider, MD  furosemide (LASIX) 40 MG tablet TAKE ONE TABLET BY MOUTH TWICE DAILY 10/29/11  Yes Wendall Stade, MD  LANOXIN 0.125 MG tablet TAKE ONE TABLET BY MOUTH EVERY DAY 09/30/11  Yes Jacques Navy, MD  metFORMIN (  GLUCOPHAGE) 500 MG tablet TAKE ONE TABLET BY MOUTH EVERY DAY 11/26/11  Yes Corwin Levins, MD  metolazone (ZAROXOLYN) 2.5 MG tablet TAKE ONE TABLET BY MOUTH EVERY MORNING ON MONDAY, WEDNESDAY, AND FRIDAY 30 MINUTES BEFORE TAKING FUROSEMIDE. 04/08/11  Yes Jacques Navy, MD  metoprolol (LOPRESSOR) 50 MG tablet TAKE ONE TABLET BY MOUTH TWICE DAILY 10/29/11  Yes Jacques Navy, MD  potassium chloride SA (KLOR-CON M20) 20 MEQ tablet 1 tab by mouth three times per day 11/03/11  Yes Corwin Levins, MD  promethazine (PHENERGAN) 12.5 MG tablet TAKE ONE TABLET BY MOUTH EVERY 6 HOURS AS NEEDED FOR NAUSEA 08/26/11  Yes Jacques Navy, MD  simvastatin (ZOCOR) 20 MG tablet Take 1  tablet (20 mg total) by mouth at bedtime. 07/29/11  Yes Jacques Navy, MD  temazepam (RESTORIL) 15 MG capsule TAKE ONE CAPSULE BY MOUTH AT BEDTIME 08/26/11  Yes Jacques Navy, MD  traZODone (DESYREL) 50 MG tablet  02/24/11  Yes Jacques Navy, MD    Allergies:  Allergies  Allergen Reactions  . Codeine     REACTION: Hives    Family History  Problem Relation Age of Onset  . Heart disease Father     CHF  . Lymphoma Sister   . Colon cancer Neg Hx     Social History:  reports that she has never smoked. She has never used smokeless tobacco. She reports that she does not drink alcohol or use illicit drugs.   Review of Systems  Constitutional: Negative for fever and chills.  HENT: Negative.  Eyes: Negative. Negative for discharge and redness.  Respiratory: Negative. Negative for cough and shortness of breath.  Cardiovascular: Negative. Negative for chest pain.  Gastrointestinal: Positive for nausea. Negative for vomiting, abdominal pain and diarrhea.  Genitourinary: Negative. Negative for dysuria and vaginal discharge.  Musculoskeletal: Negative. Negative for back pain.  Skin: Negative. Negative for color change and rash.  Neurological: Positive for weakness. Negative for syncope and headaches.  Hematological: Negative. Negative for adenopathy.  Psychiatric/Behavioral: Negative. Negative for confusion.  All other systems reviewed and are negative.   PHYSICAL EXAM: Blood pressure 140/43, pulse 66, temperature 98.2 F (36.8 C), temperature source Oral, resp. rate 18, SpO2 96.00%. Nursing note and vitals reviewed.  Constitutional: She is oriented to person, place, and time. She appears well-developed and well-nourished. Non-toxic appearance. She does not have a sickly appearance.  HENT:  Head: Normocephalic and atraumatic.  Eyes: Conjunctivae, EOM and lids are normal. Pupils are equal, round, and reactive to light. No scleral icterus.  Neck: Trachea normal and normal range  of motion. Neck supple.  Cardiovascular: Normal rate, regular rhythm and normal heart sounds. Exam reveals no gallop and no friction rub.  No murmur heard.  Pulmonary/Chest: Effort normal. No respiratory distress. She has no wheezes. She has no rales. She exhibits no tenderness.  Abdominal: Soft. Normal appearance. There is no tenderness. There is no rebound, no guarding and no CVA tenderness.  Musculoskeletal: Normal range of motion.  Neurological: She is alert and oriented to person, place, and time. She has normal strength.  Skin: Skin is warm, dry and intact. No rash noted.  Psychiatric: She has a normal mood and affect. Her behavior is normal. Judgment and thought content normal.     No results found for this or any previous visit (from the past 240 hour(s)).   Results for orders placed during the hospital encounter of 12/08/11 (from the past 48 hour(s))  GLUCOSE,  CAPILLARY     Status: Abnormal   Collection Time   12/08/11  1:28 PM      Component Value Range Comment   Glucose-Capillary >600 (*) 70 - 99 (mg/dL)   CBC     Status: Abnormal   Collection Time   12/08/11  2:00 PM      Component Value Range Comment   WBC 5.9  4.0 - 10.5 (K/uL)    RBC 3.00 (*) 3.87 - 5.11 (MIL/uL)    Hemoglobin 9.0 (*) 12.0 - 15.0 (g/dL)    HCT 40.9 (*) 81.1 - 46.0 (%)    MCV 94.3  78.0 - 100.0 (fL)    MCH 30.0  26.0 - 34.0 (pg)    MCHC 31.8  30.0 - 36.0 (g/dL)    RDW 91.4 (*) 78.2 - 15.5 (%)    Platelets 206  150 - 400 (K/uL)   DIFFERENTIAL     Status: Normal   Collection Time   12/08/11  2:00 PM      Component Value Range Comment   Neutrophils Relative 73  43 - 77 (%)    Neutro Abs 4.3  1.7 - 7.7 (K/uL)    Lymphocytes Relative 14  12 - 46 (%)    Lymphs Abs 0.8  0.7 - 4.0 (K/uL)    Monocytes Relative 8  3 - 12 (%)    Monocytes Absolute 0.5  0.1 - 1.0 (K/uL)    Eosinophils Relative 4  0 - 5 (%)    Eosinophils Absolute 0.3  0.0 - 0.7 (K/uL)    Basophils Relative 1  0 - 1 (%)    Basophils  Absolute 0.0  0.0 - 0.1 (K/uL)   BASIC METABOLIC PANEL     Status: Abnormal   Collection Time   12/08/11  2:00 PM      Component Value Range Comment   Sodium 129 (*) 135 - 145 (mEq/L)    Potassium 4.5  3.5 - 5.1 (mEq/L)    Chloride 89 (*) 96 - 112 (mEq/L)    CO2 28  19 - 32 (mEq/L)    Glucose, Bld 672 (*) 70 - 99 (mg/dL)    BUN 62 (*) 6 - 23 (mg/dL)    Creatinine, Ser 9.56  0.50 - 1.10 (mg/dL)    Calcium 9.5  8.4 - 10.5 (mg/dL)    GFR calc non Af Amer 54 (*) >90 (mL/min)    GFR calc Af Amer 62 (*) >90 (mL/min)   CARDIAC PANEL(CRET KIN+CKTOT+MB+TROPI)     Status: Normal   Collection Time   12/08/11  2:00 PM      Component Value Range Comment   Total CK 26  7 - 177 (U/L)    CK, MB 1.8  0.3 - 4.0 (ng/mL)    Troponin I <0.30  <0.30 (ng/mL)    Relative Index RELATIVE INDEX IS INVALID  0.0 - 2.5    URINALYSIS, ROUTINE W REFLEX MICROSCOPIC     Status: Abnormal   Collection Time   12/08/11  2:25 PM      Component Value Range Comment   Color, Urine YELLOW  YELLOW     APPearance CLEAR  CLEAR     Specific Gravity, Urine 1.017  1.005 - 1.030     pH 7.0  5.0 - 8.0     Glucose, UA >1000 (*) NEGATIVE (mg/dL)    Hgb urine dipstick NEGATIVE  NEGATIVE     Bilirubin Urine NEGATIVE  NEGATIVE     Ketones,  ur NEGATIVE  NEGATIVE (mg/dL)    Protein, ur NEGATIVE  NEGATIVE (mg/dL)    Urobilinogen, UA 0.2  0.0 - 1.0 (mg/dL)    Nitrite NEGATIVE  NEGATIVE     Leukocytes, UA NEGATIVE  NEGATIVE    URINE MICROSCOPIC-ADD ON     Status: Normal   Collection Time   12/08/11  2:25 PM      Component Value Range Comment   Squamous Epithelial / LPF RARE  RARE     Bacteria, UA RARE  RARE    GLUCOSE, CAPILLARY     Status: Abnormal   Collection Time   12/08/11  5:10 PM      Component Value Range Comment   Glucose-Capillary 461 (*) 70 - 99 (mg/dL)   POCT I-STAT, CHEM 8     Status: Abnormal   Collection Time   12/08/11  5:40 PM      Component Value Range Comment   Sodium 136  135 - 145 (mEq/L)    Potassium 3.6   3.5 - 5.1 (mEq/L)    Chloride 99  96 - 112 (mEq/L)    BUN 52 (*) 6 - 23 (mg/dL)    Creatinine, Ser 1.47  0.50 - 1.10 (mg/dL)    Glucose, Bld 829 (*) 70 - 99 (mg/dL)    Calcium, Ion 5.62  1.12 - 1.32 (mmol/L)    TCO2 26  0 - 100 (mmol/L)    Hemoglobin 8.5 (*) 12.0 - 15.0 (g/dL)    HCT 13.0 (*) 86.5 - 46.0 (%)     Dg Chest 2 View  12/08/2011  *RADIOLOGY REPORT*  Clinical Data: Hyperglycemia, nausea, history CHF, atrial fibrillation, diabetes, valvular heart disease, coronary artery disease  CHEST - 2 VIEW  Comparison: 11/02/2011  Findings: Enlargement of cardiac silhouette with pulmonary vascular congestion. Atherosclerotic calcification aorta. Minimal prominence of superior mediastinal soft tissues likely accentuated by lordotic technique. No definite acute failure or consolidation. Mild congestive changes seen on previous exam appears slightly improved. Emphysematous changes with minimal atelectasis right base. Bones demineralized. No pneumothorax.  IMPRESSION: Enlargement of cardiac silhouette with pulmonary vascular congestion. Emphysematous changes with minimal right basilar atelectasis. No acute infiltrate or failure identified.  Original Report Authenticated By: Lollie Marrow, M.D.    Impression:  Active Problems:  DIABETES MELLITUS, TYPE II  CORONARY ARTERY DISEASE  Atrial fibrillation  ASTHMA  Diabetic hyperosmolar non-ketotic state  Patient with no acute signs of infection or MI as a cause for her hyperglycemia. She's not in DKA at this time.  Due to her hyperosmolar hyperglycemic state , she  requires admission for continued hydration and monitoring of her sugars overnight.     Plan: 1, hyperosmolar hyperglycemic state   Start glucomaner gtt Rule out preciptating factors such as infection, ACS  NPO for now  2, Intractable nausea  Secondary to 1. Will check lipase, LFT's  Gastric emptying study if no improvement?  3. ASthma stable  4. Iron deficiency anemia Followed  by Dr Gwenyth Bouillon , hg at baseline  Full code      [facility] 12/08/2011, 7:10 PM

## 2011-12-09 DIAGNOSIS — I059 Rheumatic mitral valve disease, unspecified: Secondary | ICD-10-CM

## 2011-12-09 DIAGNOSIS — E119 Type 2 diabetes mellitus without complications: Secondary | ICD-10-CM

## 2011-12-09 DIAGNOSIS — N039 Chronic nephritic syndrome with unspecified morphologic changes: Secondary | ICD-10-CM

## 2011-12-09 DIAGNOSIS — D631 Anemia in chronic kidney disease: Secondary | ICD-10-CM

## 2011-12-09 LAB — COMPREHENSIVE METABOLIC PANEL
AST: 16 U/L (ref 0–37)
Alkaline Phosphatase: 91 U/L (ref 39–117)
BUN: 39 mg/dL — ABNORMAL HIGH (ref 6–23)
CO2: 27 mEq/L (ref 19–32)
Chloride: 102 mEq/L (ref 96–112)
Creatinine, Ser: 0.7 mg/dL (ref 0.50–1.10)
GFR calc non Af Amer: 75 mL/min — ABNORMAL LOW (ref >90)
Potassium: 3.1 mEq/L — ABNORMAL LOW (ref 3.5–5.1)
Total Bilirubin: 0.4 mg/dL (ref 0.3–1.2)

## 2011-12-09 LAB — CBC
MCH: 29.8 pg (ref 26.0–34.0)
MCV: 94.6 fL (ref 78.0–100.0)
Platelets: 170 10*3/uL (ref 150–400)
RBC: 2.42 MIL/uL — ABNORMAL LOW (ref 3.87–5.11)
RDW: 17.2 % — ABNORMAL HIGH (ref 11.5–15.5)

## 2011-12-09 LAB — HEMOGLOBIN A1C
Hgb A1c MFr Bld: 10.5 % — ABNORMAL HIGH (ref <5.7)
Mean Plasma Glucose: 255 mg/dL — ABNORMAL HIGH (ref <117)

## 2011-12-09 LAB — GLUCOSE, CAPILLARY
Glucose-Capillary: 200 mg/dL — ABNORMAL HIGH (ref 70–99)
Glucose-Capillary: 135 mg/dL — ABNORMAL HIGH (ref 70–99)
Glucose-Capillary: 152 mg/dL — ABNORMAL HIGH (ref 70–99)
Glucose-Capillary: 179 mg/dL — ABNORMAL HIGH (ref 70–99)
Glucose-Capillary: 260 mg/dL — ABNORMAL HIGH (ref 70–99)

## 2011-12-09 LAB — CARDIAC PANEL(CRET KIN+CKTOT+MB+TROPI)
CK, MB: 2.8 ng/mL (ref 0.3–4.0)
CK, MB: 2.8 ng/mL (ref 0.3–4.0)
Relative Index: INVALID (ref 0.0–2.5)
Total CK: 65 U/L (ref 7–177)
Troponin I: <0.3 ng/mL (ref <0.30)
Troponin I: <0.3 ng/mL (ref <0.30)

## 2011-12-09 MED ORDER — INSULIN GLARGINE 100 UNIT/ML ~~LOC~~ SOLN
10.0000 [IU] | Freq: Every day | SUBCUTANEOUS | Status: DC
  Administered 2011-12-09 – 2011-12-10 (×3): 10 [IU] via SUBCUTANEOUS

## 2011-12-09 MED ORDER — INSULIN ASPART 100 UNIT/ML ~~LOC~~ SOLN
0.0000 [IU] | Freq: Three times a day (TID) | SUBCUTANEOUS | Status: DC
  Administered 2011-12-09 (×2): 9 [IU] via SUBCUTANEOUS
  Administered 2011-12-10: 3 [IU] via SUBCUTANEOUS
  Administered 2011-12-10: 1 [IU] via SUBCUTANEOUS
  Administered 2011-12-10: 3 [IU] via SUBCUTANEOUS
  Administered 2011-12-11: 2 [IU] via SUBCUTANEOUS
  Administered 2011-12-11 (×2): 3 [IU] via SUBCUTANEOUS
  Administered 2011-12-12: 1 [IU] via SUBCUTANEOUS
  Administered 2011-12-12 – 2011-12-13 (×3): 2 [IU] via SUBCUTANEOUS
  Administered 2011-12-14: 1 [IU] via SUBCUTANEOUS
  Administered 2011-12-14: 2 [IU] via SUBCUTANEOUS
  Administered 2011-12-16 – 2011-12-17 (×3): 1 [IU] via SUBCUTANEOUS

## 2011-12-09 MED ORDER — INSULIN ASPART 100 UNIT/ML ~~LOC~~ SOLN
0.0000 [IU] | SUBCUTANEOUS | Status: DC
  Administered 2011-12-09: 4 [IU] via SUBCUTANEOUS

## 2011-12-09 MED ORDER — LEVALBUTEROL HCL 0.63 MG/3ML IN NEBU
0.6300 mg | INHALATION_SOLUTION | Freq: Three times a day (TID) | RESPIRATORY_TRACT | Status: DC
  Administered 2011-12-09: 0.63 mg via RESPIRATORY_TRACT
  Filled 2011-12-09 (×4): qty 3

## 2011-12-09 MED ORDER — GLUCERNA SHAKE PO LIQD
237.0000 mL | Freq: Every day | ORAL | Status: DC
  Administered 2011-12-09 – 2011-12-15 (×7): 237 mL via ORAL
  Filled 2011-12-09 (×8): qty 237

## 2011-12-09 MED ORDER — FUROSEMIDE 10 MG/ML IJ SOLN
20.0000 mg | Freq: Once | INTRAMUSCULAR | Status: AC
  Administered 2011-12-09: 20 mg via INTRAVENOUS
  Filled 2011-12-09: qty 2

## 2011-12-09 MED ORDER — LEVALBUTEROL HCL 0.63 MG/3ML IN NEBU
0.6300 mg | INHALATION_SOLUTION | RESPIRATORY_TRACT | Status: DC | PRN
  Filled 2011-12-09: qty 3

## 2011-12-09 NOTE — Progress Notes (Signed)
  Echocardiogram 2D Echocardiogram has been performed.  Leaann Nevils L 12/09/2011, 3:18 PM

## 2011-12-09 NOTE — Progress Notes (Signed)
INITIAL ADULT NUTRITION ASSESSMENT Date: 12/09/2011   Time: 2:43 PM Reason for Assessment: Nutrition Risk for unintentional weight loss  ASSESSMENT: Female 78 y.o.  Dx: Diabetic hyperosmolar non-ketotic state, chronic anemia for transfusion  Hx:  Past Medical History  Diagnosis Date  . Inguinal hernia   . Mitral valve insufficiency and aortic valve insufficiency   . Other and unspecified hyperlipidemia   . Pneumonia   . Osteoarthrosis, unspecified whether generalized or localized, unspecified site   . Nephrolithiasis   . Unspecified essential hypertension   . Type II or unspecified type diabetes mellitus without mention of complication, not stated as uncontrolled   . Coronary atherosclerosis of unspecified type of vessel, native or graft   . Congestive heart failure, unspecified   . Atrial fibrillation   . Unspecified asthma   . Anemia, unspecified   . Allergic rhinitis, cause unspecified   . Colon polyps 10/2009   Past Surgical History  Procedure Date  . Breast cyst aspiration     x50  . Abdominal hysterectomy 1990  . Lumbar fusion 2007    [facility]  . Total hip arthroplasty 01/2007    Right - Dr Valentina Gu  . Inguinal hernia repair 2010    [facility]    Related Meds: lovenox, lasix, novolog, lantus, Ht: 5' 4.17" (163 cm)  Wt: 117 lb 11.6 oz (53.4 kg)  Ideal Wt: 55.1 kg  % Ideal Wt: 97  Usual Wt:  Wt Readings from Last 3 Encounters:  12/08/11 117 lb 11.6 oz (53.4 kg)  09/15/11 124 lb 9.6 oz (56.518 kg)  07/06/11 124 lb 4.8 oz (56.382 kg)    % Usual Wt: 95% from 3 months ago  Body mass index is 20.10 kg/(m^2).  Food/Nutrition Related Hx: unknown.    Labs:  CMP     Component Value Date/Time   NA 138 12/09/2011 0506   K 3.1* 12/09/2011 0506   CL 102 12/09/2011 0506   CO2 27 12/09/2011 0506   GLUCOSE 162* 12/09/2011 0506   BUN 39* 12/09/2011 0506   CREATININE 0.70 12/09/2011 0506   CALCIUM 9.2 12/09/2011 0506   PROT 6.0 12/09/2011 0506   ALBUMIN 3.0* 12/09/2011 0506   AST 16  12/09/2011 0506   ALT 14 12/09/2011 0506   ALKPHOS 91 12/09/2011 0506   BILITOT 0.4 12/09/2011 0506   GFRNONAA 75* 12/09/2011 0506   GFRAA 86* 12/09/2011 0506    Lab Results  Component Value Date   HGBA1C 10.5* 12/08/2011   HGBA1C 10.5* 12/08/2011   HGBA1C 6.0* 07/06/2011   Lab Results  Component Value Date   LDLCALC  Value: 39        Total Cholesterol/HDL:CHD Risk Coronary Heart Disease Risk Table                     Men   Women  1/2 Average Risk   3.4   3.3  Average Risk       5.0   4.4  2 X Average Risk   9.6   7.1  3 X Average Risk  23.4   11.0        Use the calculated Patient Ratio above and the CHD Risk Table to determine the patient's CHD Risk.        ATP III CLASSIFICATION (LDL):  <100     mg/dL   Optimal  161-096  mg/dL   Near or Above  Optimal  130-159  mg/dL   Borderline  960-454  mg/dL   High  >098     mg/dL   Very High 08/28/1476   CREATININE 0.70 12/09/2011    Diet Order: CHO MOD MED   Supplements/Tube Feeding:none  IVF:    sodium chloride Last Rate: 150 mL/hr at 12/09/11 0932  DISCONTD: insulin (NOVOLIN-R) infusion Last Rate: Stopped (12/09/11 0106)    Estimated Nutritional Needs:per day Kcal: 1350-1450 Protein: 60-70 g Fluid: >1.35L Weight loss of 5 % in the past 3 months, temporal wasting, decreased fat and muscle mass.  100% FL breakfast today but decreased intake per nurse.  Weight loss secondary to uncontrolled DM and/or inadequate intake prior to admit.    NUTRITION DIAGNOSIS: -Predicted suboptimal energy intake (NI-1.6).  Status: Ongoing  RELATED TO: unknown  AS EVIDENCE BY: Weight loss prior to admit, RN report  MONITORING/EVALUATION(Goals): Monitor:  Intake, weight trend, labs, glucose Goal:  Meet 100% estimated needs with po meals and supplements  EDUCATION NEEDS: -Education not appropriate at this time  INTERVENTION: Will add glucerna shake q hs   Dietitian #:[phone]  DOCUMENTATION CODES Per approved criteria  -Not Applicable     Jeoffrey Massed 12/09/2011, 2:43 PM

## 2011-12-09 NOTE — Progress Notes (Signed)
Subjective: [name] has MDS with recurrent anemia. She has had multiple admissions for transfusions, last in Nov '12. She is followed by Dr. Arbutus Ped. The patient has been feeling weak for several weeks. Hgb was checked and was 8.3. She did not require transfusion. She had continued weakness and was seen by Dr. Jonny Ruiz April 13th - note reviewed. For continued weakness she was referred by Dr. Arbutus Ped to the ED where she was found to be hyperglycemic. subequent A1c = 10%, up from 6% in Nov '12 when she was d/c from hospital. She had been taking her metformin.   [name] doesn't feel well but she is hungry. She is not nauseated. She has no chest pain.  Objective: Lab: Lab Results  Component Value Date   WBC 6.0 12/09/2011   HGB 7.2* 12/09/2011   HCT 22.9* 12/09/2011   MCV 94.6 12/09/2011   PLT 170 12/09/2011   BMET    Component Value Date/Time   NA 138 12/09/2011 0506   K 3.1* 12/09/2011 0506   CL 102 12/09/2011 0506   CO2 27 12/09/2011 0506   GLUCOSE 162* 12/09/2011 0506   BUN 39* 12/09/2011 0506   CREATININE 0.70 12/09/2011 0506   CALCIUM 9.2 12/09/2011 0506   GFRNONAA 75* 12/09/2011 0506   GFRAA 86* 12/09/2011 0506   Lab Results  Component Value Date   HGBA1C 10.5* 12/08/2011   Cardiac Panel (last 3 results)  Basename 12/09/11 0506 12/08/11 2230 12/08/11 1400  CKTOTAL 69 33 26  CKMB 2.8 1.9 1.8  TROPONINI <0.30 <0.30 <0.30  RELINDX RELATIVE INDEX IS INVALID RELATIVE INDEX IS INVALID RELATIVE INDEX IS INVALID    Imaging: CXR 12/08/11 IMPRESSION:  Enlargement of cardiac silhouette with pulmonary vascular  congestion.  Emphysematous changes with minimal right basilar atelectasis.  No acute infiltrate or failure identified.   Physical Exam: Filed Vitals:   12/09/11 0517  BP: 133/55  Pulse: 89  Temp: 98.1 F (36.7 C)  Resp: 20    Intake/Output Summary (Last 24 hours) at 12/09/11 0839 Last data filed at 12/09/11 0518  Gross per 24 hour  Intake 497.85 ml  Output   2000 ml  Net  -1502.15 ml   CBG (last 3)   Basename 12/09/11 0742 12/09/11 0403 12/09/11 0305  GLUCAP 179* 155* 152*    Gen'l- elderly white woman who is not feeling well - she doesn't have her hair and face on. Cor - RRR with PVCs, II/VII systolic murmur Pulm - normal respirations, no rales or wheezes Abd- BS+, soft, no guarding or rebound Neuro - A&O x 3.     Assessment/Plan: 1. DM - severe hyperglycemia and her A1C at 10.5% indicates loss of control since November with A1C 6.0%. She reports she has been taking her medication. No evidence of cardiac disease. No evidence of infection.  Plan Continue sliding scale AC, no HS coverage  Diet- full liquids and advance as tolerated.  Will consider changing medication from metformin at time of d/c  2. Chronic anemia - MDS?Marland Kitchen Her Hgb has dropped to 7.2 g. Baseline is 9-10.  Plan - transfuse 2 uPRBCs  3. HTN -  BP Readings from Last 3 Encounters:  12/09/11 133/55  11/02/11 142/78  09/15/11 146/58      [name] 12/09/2011, 8:34 AM

## 2011-12-09 NOTE — Progress Notes (Signed)
   CARE MANAGEMENT NOTE 12/09/2011  Patient:  [name], [name]   Account Number:  [id number]  Date Initiated:  12/09/2011  Documentation initiated by:  Lanier Clam  Subjective/Objective Assessment:   ADMITTED W/ELEVATED GLUCOSE, NAUSEA.HX:DM,CHRONIC ANEMIA.     Action/Plan:   FROM HOME ALONE   Anticipated DC Date:  12/14/2011   Anticipated DC Plan:  HOME W HOME HEALTH SERVICES         Choice offered to / List presented to:             Status of service:  In process, will continue to follow Medicare Important Message given?   (If response is "NO", the following Medicare IM given date fields will be blank) Date Medicare IM given:   Date Additional Medicare IM given:    Discharge Disposition:    Per UR Regulation:  Reviewed for med. necessity/level of care/duration of stay  If discussed at Long Length of Stay Meetings, dates discussed:    Comments:  12/09/11 [facility] RN,BSN NCM [phone]

## 2011-12-09 NOTE — Progress Notes (Signed)
Patient was admitted for general weakness and increased blood sugar. Patient is on room air with a saturation of 98%. Her breath sounds are clear and diminished. She is currently not on any respiratory medications at home. RT will change from Xopenex Q6 to TID.

## 2011-12-10 LAB — GLUCOSE, CAPILLARY
Glucose-Capillary: 236 mg/dL — ABNORMAL HIGH (ref 70–99)
Glucose-Capillary: 209 mg/dL — ABNORMAL HIGH (ref 70–99)
Glucose-Capillary: 222 mg/dL — ABNORMAL HIGH (ref 70–99)

## 2011-12-10 MED ORDER — METFORMIN HCL 500 MG PO TABS
500.0000 mg | ORAL_TABLET | Freq: Two times a day (BID) | ORAL | Status: DC
  Administered 2011-12-10 – 2011-12-17 (×15): 500 mg via ORAL
  Filled 2011-12-10 (×17): qty 1

## 2011-12-10 NOTE — Progress Notes (Signed)
CSW spoke with patients son, greg, he is requesting physical therapy evaluate patient to see if patient may need SNF. CSW left note for MD for same.  Kyrielle Urbanski C. Leonardo Makris MSW, LCSW [phone]

## 2011-12-10 NOTE — Progress Notes (Signed)
Subjective: She feels a little better but did not sleep well. No nausea or vomiting.  Objective: [id number] Hgb is pending  Imaging: no new imaging 2 D echo Dec 09, 2011: Impressions:  - Normal LV size and systolic function, EF 60-65%. There was severe mitral regurgitation with a partial flail anterior leaflet. Massive biatrial enlargement. Normal RV size and systolic function. Moderate tricuspid regurgitation. Severe pulmonary hypertension.    Physical Exam: Filed Vitals:   12/10/11 0416  BP: 160/65  Pulse: 85  Temp: 97.9 F (36.6 C)  Resp: 19    Intake/Output Summary (Last 24 hours) at 12/10/11 0806 Last data filed at 12/10/11 0419  Gross per 24 hour  Intake   2303 ml  Output   1750 ml  Net    553 ml   CBG (last 3)   Basename 12/10/11 0732 12/09/11 2041 12/09/11 1708  GLUCAP 147* 260* 382*    Gen'l- very eldelry white woman in no distress HEENT - C&S clear Cor - RRR II/VI murmur Pulm - normal respirations, no rales or wheeze Abd - soft, BS+ Neuro - A&O x 3, MAE     Assessment/Plan: 1. DM - CBG reveal better control. She continues on sliding scale Plan - resume metformin  Continue lantus  Continue sliding scale  2. Chronic anemia - s/p 2u PRBCs with post-transfusion lab pending.  3. HTN - stable  4. Cardiac - negative enzymes. Echo reveals fairly good function with severe mitral valve disease, severe pulmonary hypertension Plan D/c telemetry   Illene Regulus 12/10/2011, 8:05 AM

## 2011-12-11 ENCOUNTER — Telehealth: Payer: Self-pay | Admitting: Internal Medicine

## 2011-12-11 LAB — TYPE AND SCREEN
ABO/RH(D): A POS
Antibody Screen: NEGATIVE
Donor AG Type: NEGATIVE
Donor AG Type: NEGATIVE
Unit division: 0

## 2011-12-11 LAB — GLUCOSE, CAPILLARY
Glucose-Capillary: 248 mg/dL — ABNORMAL HIGH (ref 70–99)
Glucose-Capillary: 183 mg/dL — ABNORMAL HIGH (ref 70–99)

## 2011-12-11 MED ORDER — INSULIN GLARGINE 100 UNIT/ML ~~LOC~~ SOLN
15.0000 [IU] | Freq: Every day | SUBCUTANEOUS | Status: DC
  Administered 2011-12-11 – 2011-12-16 (×6): 15 [IU] via SUBCUTANEOUS

## 2011-12-11 MED ORDER — SENNA 8.6 MG PO TABS
1.0000 | ORAL_TABLET | Freq: Two times a day (BID) | ORAL | Status: DC | PRN
  Administered 2011-12-12: 17.2 mg via ORAL
  Administered 2011-12-13: 8.6 mg via ORAL
  Administered 2011-12-15 – 2011-12-16 (×2): 17.2 mg via ORAL
  Filled 2011-12-11: qty 1
  Filled 2011-12-11 (×3): qty 2

## 2011-12-11 NOTE — Progress Notes (Signed)
Inpatient Diabetes Program Recommendations  AACE/ADA: New Consensus Statement on Inpatient Glycemic Control (2009)  Target Ranges:  Prepandial:   less than 140 mg/dL      Peak postprandial:   less than 180 mg/dL (1-2 hours)      Critically ill patients:  140 - 180 mg/dL   Reason for Visit: Hyperglycemia Results for SHALEY, LEAVENS (MRN [id number]) as of 12/11/2011 12:56  Ref. Range 12/08/2011 22:30  Hemoglobin A1C Latest Range: <5.7 % 10.5 (H)  Results for TENITA, CUE (MRN [id number]) as of 12/11/2011 12:56  Ref. Range 12/10/2011 11:37 12/10/2011 16:34 12/10/2011 21:48 12/11/2011 07:39 12/11/2011 11:28  Glucose-Capillary Latest Range: 70-99 mg/dL 403 (H) 474 (H) 259 (H) 186 (H) 248 (H)     Inpatient Diabetes Program Recommendations Insulin - Meal Coverage: Add meal coverage insulin if pt eats > 50% meal - Novolog 3 units tidwc  Note: Pt will probably need to go home on basal - bolus insulin therapy.  Will need to teach insulin administration to family / patient.  MD, please advise.  Will order insulin starter kit and RN to begin teaching insulin admin if MD okays.

## 2011-12-11 NOTE — Telephone Encounter (Signed)
greg called and l/m that pt was in the hosp and to cx 5/6 appts   aom

## 2011-12-11 NOTE — Progress Notes (Signed)
Subjective: [name] states that she feels a little better, a little stronger. She will still have waves of nausea but no emesis.  Objective: Lab: Lab Results  Component Value Date   WBC 6.0 12/09/2011   HGB 8.8* 12/10/2011   HCT 27.5* 12/10/2011   MCV 94.6 12/09/2011   PLT 170 12/09/2011   BMET    Component Value Date/Time   NA 138 12/09/2011 0506   K 3.1* 12/09/2011 0506   CL 102 12/09/2011 0506   CO2 27 12/09/2011 0506   GLUCOSE 162* 12/09/2011 0506   BUN 39* 12/09/2011 0506   CREATININE 0.70 12/09/2011 0506   CALCIUM 9.2 12/09/2011 0506   GFRNONAA 75* 12/09/2011 0506   GFRAA 86* 12/09/2011 0506      Imaging: no new imaging   Physical Exam: Filed Vitals:   12/11/11 0520  BP: 128/58  Pulse: 92  Temp: 98.4 F (36.9 C)  Resp: 18  Elderly and frail appearing white woman in no distress HEENT- normal Pulm - no increased WOB, good breath sounds Cor- 2+ radial, RRR, II/VI systolic murmur Abd - BS+, soft Neuro - A&O, speech is clear. Lacks insight into her condition     Assessment/Plan: 1. DM-  CBG (last 3)   Basename 12/11/11 0739 12/10/11 2148 12/10/11 1634  GLUCAP 186* 222* 209*    Plan -  Will increase basal insulin  Continue metformin  2. Chronic anemia - Hgb at 8.8 is ok.  Plan -  Recheck H/H Sunday  3. HTN- stable  4. Cardiac - stable  5. Dispo - patient with progressive weakness. She may benefit from snf for PT/OT as well as supervision with her medications.  Plan PT/OT consult  Spoke with son (POA): advised him to visit several facilities, e.g. [facility], [address] [facility]   Illene Regulus 12/11/2011, 10:29 AM

## 2011-12-12 LAB — GLUCOSE, CAPILLARY
Glucose-Capillary: 96 mg/dL (ref 70–99)
Glucose-Capillary: 174 mg/dL — ABNORMAL HIGH (ref 70–99)
Glucose-Capillary: 150 mg/dL — ABNORMAL HIGH (ref 70–99)

## 2011-12-12 MED ORDER — FERROUS SULFATE 325 (65 FE) MG PO TABS
325.0000 mg | ORAL_TABLET | Freq: Two times a day (BID) | ORAL | Status: DC
  Administered 2011-12-12 – 2011-12-17 (×11): 325 mg via ORAL
  Filled 2011-12-12 (×13): qty 1

## 2011-12-12 NOTE — Evaluation (Signed)
Physical Therapy Evaluation Patient Details Name: [name] MRN: [id number] DOB: 05-29-1920 Today's Date: 12/12/2011 Time: 0920-0958 PT Time Calculation (min): 38 min  PT Assessment / Plan / Recommendation Clinical Impression  Pt presents with generalized weakness, decreaased ability for basic mobility and safety .  will need skilled PT to return to safe level to be home indepent and alone.  Recommend short term SNF then home.      PT Assessment  Patient needs continued PT services    Follow Up Recommendations  Skilled nursing facility (short term SNF then to return home unless pt progresses faster)    Equipment Recommendations  None recommended by PT    Frequency Min 3X/week    Precautions / Restrictions Precautions Precautions: None   Pertinent Vitals/Pain See vital for this time     Mobility  Bed Mobility Bed Mobility: Left Sidelying to Sit Left Sidelying to Sit: 4: Min assist;With rails;HOB elevated Transfers Transfers: Sit to Stand;Stand to Sit Sit to Stand: 3: Mod assist;From bed Stand to Sit: 4: Min assist;To chair/3-in-1 Details for Transfer Assistance: Assistance need intially due to first time up since hospitalization, felt weak and stiff.  Anticipate that her mobility will progress with more movement throughout the day today.  Ambulation/Gait Ambulation/Gait Assistance: 4: Min assist Ambulation Distance (Feet): 50 Feet Assistive device: Rolling walker Ambulation/Gait Assistance Details: pt with step to pattern, slow, dec base of support, slightly antalgic for L side.  Gait Pattern: Step-to pattern;Decreased stance time - left;Decreased step length - left;Decreased step length - right Stairs: No    Exercises     PT Goals Acute Rehab PT Goals PT Goal Formulation: With patient Time For Goal Achievement: 12/26/11 Potential to Achieve Goals: Good Pt will Roll Supine to Right Side: Independently PT Goal: Rolling Supine to Right Side - Progress: Goal set  today Pt will go Sit to Stand: Independently PT Goal: Sit to Stand - Progress: Goal set today Pt will go Stand to Sit: Independently PT Goal: Stand to Sit - Progress: Goal set today Pt will Ambulate: 51 - 150 feet;with modified independence;with rolling walker PT Goal: Ambulate - Progress: Goal set today  Visit Information  Last PT Received On: 12/12/11 Assistance Needed: +1    Subjective Data  Subjective: I feel better than when I came in, but still feel weak and a little light headed.  After the session she stated that her head felt a little better and she felt a little stronger as the session progressed, but still too weak to return home now.  Patient Stated Goal: to eventually return home   Prior Functioning  Home Living Lives With: Alone (dtr in town and checks on pt, son comes into town 3days/wk ) Available Help at Discharge: Family;Friend(s);Other (Comment) (unsure if it is 24 hour care) Type of Home: House Home Access: Level entry Home Layout: One level Home Adaptive Equipment: Walker - rolling Additional Comments: has been using RW more lately Prior Function Level of Independence: Independent Able to Take Stairs?: Yes Driving: No Vocation: Retired Musician: No difficulties    Cognition  Overall Cognitive Status: Appears within functional limits for tasks assessed/performed Arousal/Alertness: Awake/alert Orientation Level: Appears intact for tasks assessed;Oriented X4 / Intact Behavior During Session: [facility] for tasks performed    Extremity/Trunk Assessment Right Lower Extremity Assessment RLE ROM/Strength/Tone: Within functional levels (favored LLE in weightbearing) RLE Sensation: WFL - Light Touch RLE Coordination: WFL - gross/fine motor Left Lower Extremity Assessment LLE ROM/Strength/Tone: Within functional levels LLE  Sensation: WFL - Light Touch LLE Coordination: WFL - gross/fine motor Trunk Assessment Trunk Assessment: Normal   Balance      End of Session PT - End of Session Equipment Utilized During Treatment: Gait belt Activity Tolerance: Patient tolerated treatment well Patient left: in chair;with call bell/phone within reach Nurse Communication: Mobility status (RW in room for nursing to use to/from bathroom with asssit)   Keishaun Hazel 12/12/2011, 10:18 AM  Marella Bile, PT Pager: [phone] 12/12/2011

## 2011-12-12 NOTE — Evaluation (Signed)
Occupational Therapy Evaluation Patient Details Name: [name] MRN: [id number] DOB: 09-26-1919 Today's Date: 12/12/2011 Time: 8295-6213 OT Time Calculation (min): 14 min  OT Assessment / Plan / Recommendation Clinical Impression  78 yo female admitted due to weight loss and FTT that could benefit from skilled OT. see problem list below. Recommend SNF for d/c planning    OT Assessment  Patient needs continued OT Services    Follow Up Recommendations  Skilled nursing facility    Equipment Recommendations  Defer to next venue    Frequency Min 2X/week    Precautions / Restrictions Precautions Precautions: Fall   Pertinent Vitals/Pain Feel dizzy sitting in chair on arrival BP taken 136/57  No more c/o dizziness during session or at end    ADL  Eating/Feeding: Performed;Set up Where Assessed - Eating/Feeding: Chair Grooming: Performed;Wash/dry hands;Supervision/safety Where Assessed - Grooming: Standing at sink Lower Body Dressing: Simulated;Moderate assistance Where Assessed - Lower Body Dressing: Sitting, chair;Supported Statistician: IT sales professional Method: Proofreader: Raised toilet seat with arms (or 3-in-1 over toilet) Toileting - Clothing Manipulation: Performed;Minimal assistance Where Assessed - Toileting Clothing Manipulation: Sit to stand from 3-in-1 or toilet Equipment Used: Gait belt;Rolling walker Ambulation Related to ADLs: Pt ambulated ~10 ft with Min (A) demonstrating decreased gait velocity and shuffled gait. pt states "oh boy you dont realize how weak you can get in a few days my legs are so weak" ADL Comments: Pt completed grooming in chair prior to arrival and has don a full face of makeup. Pt able to retrieve all necessary items from luggage on bedside table. Pt demonstrates LE weakness and balance deficits. Pt is unable to reach BIL LE with reaching during this session or crossing bil le    OT  Goals Acute Rehab OT Goals OT Goal Formulation: With patient Time For Goal Achievement: 12/26/11 Potential to Achieve Goals: Good ADL Goals Pt Will Perform Grooming: with set-up;Standing at sink ADL Goal: Grooming - Progress: Goal set today Pt Will Perform Lower Body Bathing: with set-up;Sit to stand from bed;Sit to stand from chair;with adaptive equipment ADL Goal: Lower Body Bathing - Progress: Goal set today Pt Will Perform Lower Body Dressing: with set-up;Sit to stand from chair;Sit to stand from bed;with adaptive equipment ADL Goal: Lower Body Dressing - Progress: Goal set today Pt Will Transfer to Toilet: with modified independence;3-in-1 ADL Goal: Toilet Transfer - Progress: Goal set today Pt Will Perform Toileting - Clothing Manipulation: with modified independence;Sitting on 3-in-1 or toilet ADL Goal: Toileting - Clothing Manipulation - Progress: Goal set today Pt Will Perform Toileting - Hygiene: with modified independence;Sit to stand from 3-in-1/toilet ADL Goal: Toileting - Hygiene - Progress: Goal set today Miscellaneous OT Goals Miscellaneous OT Goal #1: Pt will perform bed mobility with HOb 20 degrees or less without bed rails as a precursor to ADLs at sink level OT Goal: Miscellaneous Goal #1 - Progress: Goal set today  Visit Information  Last OT Received On: 12/12/11 Assistance Needed: +1    Subjective Data  Subjective: "i havent had a bowel movement in days" Patient Stated Goal: "oh i dont know " pt response   Prior Functioning  Home Living Lives With: Alone Available Help at Discharge: Family;Friend(s);Other (Comment) Type of Home: House Home Access: Level entry Home Layout: One level Bathroom Shower/Tub: Tub/shower unit;Curtain Firefighter: Standard Home Adaptive Equipment: Walker - rolling;Bedside commode/3-in-1 Additional Comments: has been using RW more lately Prior Function Level of Independence: Independent Able to Take  Stairs?: Yes Driving:  No Vocation: Retired Musician: No difficulties Dominant Hand: Right    Cognition  Overall Cognitive Status: Appears within functional limits for tasks assessed/performed Arousal/Alertness: Awake/alert Orientation Level: Appears intact for tasks assessed;Oriented X4 / Intact Behavior During Session: WFL for tasks performed    Extremity/Trunk Assessment Right Upper Extremity Assessment RUE ROM/Strength/Tone: Within functional levels;WFL for tasks assessed RUE Coordination: WFL - gross/fine motor Left Upper Extremity Assessment LUE ROM/Strength/Tone: Within functional levels;WFL for tasks assessed LUE Coordination: WFL - gross/fine motor Right Lower Extremity Assessment RLE ROM/Strength/Tone: Within functional levels (favored LLE in weightbearing) RLE Sensation: WFL - Light Touch RLE Coordination: WFL - gross/fine motor Left Lower Extremity Assessment LLE ROM/Strength/Tone: Within functional levels LLE Sensation: WFL - Light Touch LLE Coordination: WFL - gross/fine motor Trunk Assessment Trunk Assessment: Normal   Mobility Bed Mobility Bed Mobility: Left Sidelying to Sit Left Sidelying to Sit: 4: Min assist;With rails;HOB elevated Transfers Transfers: Sit to Stand;Stand to Sit Sit to Stand: 4: Min assist;From elevated surface;With armrests;From chair/3-in-1 Stand to Sit: 4: Min guard;With upper extremity assist;To chair/3-in-1;With armrests Details for Transfer Assistance: Assistance need intially due to first time up since hospitalization, felt weak and stiff.  Anticipate that her mobility will progress with more movement throughout the day today.    Exercise    Balance Balance Balance Assessed: Yes Static Standing Balance Static Standing - Level of Assistance: 5: Stand by assistance  End of Session OT - End of Session Equipment Utilized During Treatment: Gait belt Activity Tolerance: Patient tolerated treatment well Patient left: in chair;with call  bell/phone within reach Nurse Communication: Mobility status   Lucile Shutters 12/12/2011, 12:24 PM Pager: [phone]

## 2011-12-12 NOTE — Progress Notes (Signed)
Clinical Social Work Department CLINICAL SOCIAL WORK PLACEMENT NOTE 12/12/2011  Patient:  [name], [name]  Account Number:  [id number] Admit date:  12/08/2011  Clinical Social Worker:  Becky Sax, LCSW  Date/time:  12/12/2011 01:15 PM  Clinical Social Work is seeking post-discharge placement for this patient at the following level of care:   SKILLED NURSING   (*CSW will update this form in Epic as items are completed)   12/12/2011  Patient/family provided with [facility] Department of Clinical Social Work's list of facilities offering this level of care within the geographic area requested by the patient (or if unable, by the patient's family).  12/12/2011  Patient/family informed of their freedom to choose among providers that offer the needed level of care, that participate in Medicare, Medicaid or managed care program needed by the patient, have an available bed and are willing to accept the patient.    Patient/family informed of [facility]' ownership interest in [facility], as well as of the fact that they are under no obligation to receive care at this facility.  PASARR submitted to EDS on  PASARR number received from EDS on   FL2 transmitted to all facilities in geographic area requested by pt/family on   FL2 transmitted to all facilities within larger geographic area on   Patient informed that his/her managed care company has contracts with or will negotiate with  certain facilities, including the following:     Patient/family informed of bed offers received:   Patient chooses bed at  Physician recommends and patient chooses bed at    Patient to be transferred to  on   Patient to be transferred to facility by   The following physician request were entered in Epic:   Additional Comments:

## 2011-12-12 NOTE — Progress Notes (Signed)
Clinical Social Work Department BRIEF PSYCHOSOCIAL ASSESSMENT 12/12/2011  Patient:  DALIAH, CHAUDOIN     Account Number:  [id number]     Admit date:  12/08/2011  Clinical Social Worker:  Hattie Perch  Date/Time:  12/12/2011 01:15 PM  Referred by:  Physician  Date Referred:  12/12/2011 Referred for  SNF Placement   Other Referral:   Interview type:  Family Other interview type:    PSYCHOSOCIAL DATA Living Status:  ALONE Admitted from facility:   Level of care:   Primary support name:  Daneil Dolin - [phone] (c) [phone] (h) Primary support relationship to patient:  CHILD, ADULT Degree of support available:   Good support.  Also has son, Nerea Bordenave [phone] but he is out of town, so contact dtr.    CURRENT CONCERNS Current Concerns  Post-Acute Placement   Other Concerns:    SOCIAL WORK ASSESSMENT / PLAN Patient open and willing to be placed at SNF for further rehabilitation. Patient identifies preference with [facility] because it is closes to daughter.   Assessment/plan status:  Information/Referral to [facility] Other assessment/ plan:   Information/referral to community resources:   CSW will complete FL2 and send patient out to [facility] SNFs for placement but patient prefers [facility]    PATIENT'S/FAMILY'S RESPONSE TO PLAN OF CARE: Patient and daughter agreed to plan.

## 2011-12-12 NOTE — Progress Notes (Signed)
Subjective: Feeling much better overall and more alert. She has not gotten out of bed yet and still has her Foley. We'll make progress on those today. We'll get physical therapy started  Objective: Weight change:   Intake/Output Summary (Last 24 hours) at 12/12/11 0844 Last data filed at 12/12/11 0457  Gross per 24 hour  Intake    600 ml  Output   1075 ml  Net   -475 ml    Filed Vitals:   12/11/11 0520 12/11/11 1403 12/11/11 2152 12/12/11 0454  BP: 128/58 123/55 167/73 146/79  Pulse: 92 67 91 71  Temp: 98.4 F (36.9 C) 98.8 F (37.1 C) 98.9 F (37.2 C) 97.8 F (36.6 C)  TempSrc: Oral Oral Oral Oral  Resp: 18 16 18 16   Height:      Weight:      SpO2: 93% 93% 96% 95%    General Appearance: Alert, cooperative, no distress, very pleasant Head: Normocephalic, without obvious abnormality, atraumatic Neck: Supple, symmetrical Lungs: Clear to auscultation bilaterally, respirations unlabored Heart: Regular rate and rhythm, S1 and S2 normal, 2/6 systolic ejection murmur, mildly hyperdynamic Abdomen: Soft, non-tender, bowel sounds active all four quadrants, no masses, no organomegaly Extremities: Extremities normal, atraumatic, no cyanosis. Trace edema and right lower extremity. Negative Pulses: 2+ and symmetric all extremities Skin: Skin color, texture, turgor normal, no rashes or lesions Neuro: CNII-XII intact. Nonfocal exam   Lab Results: No results found for this basename: NA:2,K:2,CL:2,CO2:2,GLUCOSE:2,BUN:2,CREATININE:2,CALCIUM:2,MG:2,PHOS:2 in the last 72 hours No results found for this basename: AST:2,ALT:2,ALKPHOS:2,BILITOT:2,PROT:2,ALBUMIN:2 in the last 72 hours No results found for this basename: LIPASE:2,AMYLASE:2 in the last 72 hours  Basename 12/10/11 0805  WBC --  NEUTROABS --  HGB 8.8*  HCT 27.5*  MCV --  PLT --    Basename 12/09/11 1345  CKTOTAL 65  CKMB 2.8  CKMBINDEX --  TROPONINI <0.30   No components found with this basename: POCBNP:3 No results  found for this basename: DDIMER:2 in the last 72 hours No results found for this basename: HGBA1C:2 in the last 72 hours No results found for this basename: CHOL:2,HDL:2,LDLCALC:2,TRIG:2,CHOLHDL:2,LDLDIRECT:2 in the last 72 hours No results found for this basename: TSH,T4TOTAL,FREET3,T3FREE,THYROIDAB in the last 72 hours No results found for this basename: VITAMINB12:2,FOLATE:2,FERRITIN:2,TIBC:2,IRON:2,RETICCTPCT:2 in the last 72 hours  Studies/Results: No results found. Medications: Scheduled Meds:   . digoxin  0.125 mg Oral Daily  . enoxaparin  40 mg Subcutaneous Q24H  . feeding supplement  237 mL Oral q1800  . insulin aspart  0-9 Units Subcutaneous TID WC  . insulin aspart  8 Units Subcutaneous Once  . insulin glargine  15 Units Subcutaneous QHS  . metFORMIN  500 mg Oral BID WC  . metoprolol  12.5 mg Oral BID  . sodium chloride  3 mL Intravenous Q12H  . temazepam  7.5 mg Oral QHS  . DISCONTD: insulin glargine  10 Units Subcutaneous QHS   Continuous Infusions:  PRN Meds:.acetaminophen, acetaminophen, clonazePAM, dextrose, HYDROcodone-acetaminophen, levalbuterol, ondansetron (ZOFRAN) IV, ondansetron, senna  Assessment/Plan:  Assessment/Plan:  1. DM - CBGs are much better controlled. No medication changes today              Continue metformin, Lantus, and sliding scale NovoLog insulin with meals   2. Chronic anemia - Hgb at 8.8 but still iron deficient. Will start iron therapy    3. HTN- stable   4. Cardiac - stable   5. Dispo - patient with progressive weakness. - PT/OT and likely SNF placement with continued PTOT  Out of bed to chair and to bathroom with assistance. Discontinue Foley     LOS: 4 days   Laddie Naeem NEVILL 12/12/2011, 8:44 AM

## 2011-12-13 LAB — GLUCOSE, CAPILLARY

## 2011-12-13 MED ORDER — METOCLOPRAMIDE HCL 5 MG PO TABS
5.0000 mg | ORAL_TABLET | Freq: Two times a day (BID) | ORAL | Status: DC
  Administered 2011-12-13 – 2011-12-17 (×8): 5 mg via ORAL
  Filled 2011-12-13 (×10): qty 1

## 2011-12-13 NOTE — Progress Notes (Signed)
Subjective: Appreciate PT and OT evaluation > agree that she will need SNF - bed search underway.  [name] does report intermittent waves of nausea - she rates this as minor.  Objective: Lab: no new lab  Imaging: no new imaging  Physical Exam: Filed Vitals:   12/13/11 1055  BP: 129/55  Pulse: 87  Temp: 98.3 F (36.8 C)  Resp: 18    Intake/Output Summary (Last 24 hours) at 12/13/11 1237 Last data filed at 12/13/11 0900  Gross per 24 hour  Intake    720 ml  Output    725 ml  Net     -5 ml   CBG (last 3)   Basename 12/13/11 1100 12/13/11 0736 12/12/11 2132  GLUCAP 156* 99 175*   Gen'l very elderly white woman in no distress Cor - RRR III/VI holosystolic murmur best at RSB Pulm - normal respirations. Abd - mildly protuberant, BS+       Assessment/Plan: 1. DM - better control as evidenced by CBGs. Agree with recommendation to continue basal insulin therapy and patient and family can be instructed her and/or at SNF. Nausea may be related to mild gastroparesis.  Plan - starter kit and instruction re: lantus administration  Reglan 5 mg bid  2. Chronic anemia -   Plan Lab ordered for AM  3. HTN- stable  4. Cardiac - stable  5. dispo - for SNF   Illene Regulus 12/13/2011, 12:35 PM

## 2011-12-14 ENCOUNTER — Ambulatory Visit: Payer: Medicare Other | Admitting: Internal Medicine

## 2011-12-14 ENCOUNTER — Other Ambulatory Visit: Payer: Medicare Other | Admitting: Lab

## 2011-12-14 LAB — GLUCOSE, CAPILLARY
Glucose-Capillary: 183 mg/dL — ABNORMAL HIGH (ref 70–99)
Glucose-Capillary: 137 mg/dL — ABNORMAL HIGH (ref 70–99)

## 2011-12-14 LAB — HEMOGLOBIN AND HEMATOCRIT, BLOOD: HCT: 24.5 % — ABNORMAL LOW (ref 36.0–46.0)

## 2011-12-14 NOTE — Progress Notes (Signed)
Subjective: [name] is awake. She has no complaints: denies chest pain, SOB, nausea.  Objective: Lab: Hgb 7.5  Imaging: no new imaging   Physical Exam: Filed Vitals:   12/14/11 0500  BP: 144/66  Pulse: 85  Temp: 98 F (36.7 C)  Resp: 18  Very elderly white woman in no distress, appears comfortable HEENT - C&S clear Cor - RRR with PVCs, III.VI systolic murmur best at RSB Pulm - normal respirations, no rales or wheezes Abd - firm, BS+, no guarding or rebound Neuro - A&O, MAE     Assessment/Plan: 1. DM - CBG (last 3)   Basename 12/13/11 2059 12/13/11 1726 12/13/11 1100  GLUCAP 188* 182* 156*    Above normal readings but OK. Want to avoid risk of hypoglycemia. Presently on lantus 15 u and metformin with SS coverage.  Plan - continue present regimen  Teaching for pt and family on use of solostar pen  2. Chronic Anemia - Hgb has dropped back down but she is asymptomatic. Threshold is Hgb 7.0 or less or if she is symptomatic.   3,4.  Stable  5. Dispo - for SNF -  Medically ready for transfer Tuesday, May 7th.   Casimiro Needle Travonte Byard 12/14/2011, 7:13 AM

## 2011-12-14 NOTE — Progress Notes (Signed)
OT Cancellation Note  ___Treatment cancelled today due to medical issues with patient which prohibited therapy  ___ Treatment cancelled today due to patient receiving procedure or test   _x__ Treatment cancelled today due to patient's refusal to participate stating she was too fatigued from working with PT. Will check back as schedule permits.  ___ Treatment cancelled today due to   Signature: Garrel Ridgel, OTR/L  Pager [phone] 12/14/2011

## 2011-12-14 NOTE — Progress Notes (Signed)
Physical Therapy Treatment Patient Details Name: [name] MRN: [id number] DOB: April 28, 1920 Today's Date: 12/14/2011 Time: 7829-5621 PT Time Calculation (min): 25 min  PT Assessment / Plan / Recommendation Comments on Treatment Session  Assisted pt OOB to amb to BR to void then in hallway.  Pt demonstrates limited activity tolerance and requires sitting rest breaks.  Pt c/o MAX nausea with activity.  Pt plans to D/C to SNF for Rehab.    Follow Up Recommendations  Skilled nursing facility    Equipment Recommendations  Defer to next venue    Frequency Min 3X/week   Plan Discharge plan remains appropriate    Precautions / Restrictions Precautions Precautions: Fall   Pertinent Vitals/Pain     Mobility  Bed Mobility Bed Mobility: Supine to Sit Left Sidelying to Sit: 4: Min assist;Other (comment) (increased time) Sit to Supine: 4: Min assist;HOB flat Details for Bed Mobility Assistance: A needed for LLE. Transfers Transfers: Sit to Stand;Stand to Sit Sit to Stand: 4: Min assist;From bed;From toilet Stand to Sit: 4: Min guard;To toilet;To chair/3-in-1 Details for Transfer Assistance: 25% VC's on proper technique, hand placement and safety with turns and backward gait completion prior to sit. Ambulation/Gait Ambulation/Gait Assistance: 4: Min assist Ambulation Distance (Feet): 40 Feet ((20' x 2) with one sitting rest break) Assistive device: Rolling walker Ambulation/Gait Assistance Details: 25% VC's on safety with turns and proper walker to self distance.  Pt c/o max nausea with activity, RN in hallway and heard when pt stated this. Gait Pattern: Step-to pattern Gait velocity: Pt c/o L hip pain with activity, "arthritis" pt stated.  "Been hurting for some time" General Gait Details: Pt demonstartes limited activity tolerance. Stairs: No Wheelchair Mobility Wheelchair Mobility: No    Exercises     PT Goals Acute Rehab PT Goals PT Goal Formulation: With  patient Potential to Achieve Goals: Fair Pt will Roll Supine to Right Side: Independently PT Goal: Rolling Supine to Right Side - Progress: Progressing toward goal Pt will go Sit to Stand: Independently PT Goal: Sit to Stand - Progress: Progressing toward goal Pt will go Stand to Sit: Independently PT Goal: Stand to Sit - Progress: Progressing toward goal Pt will Ambulate: 1 - 15 feet;with modified independence;with rolling walker PT Goal: Ambulate - Progress: Progressing toward goal  Visit Information  Last PT Received On: 12/14/11 Assistance Needed: +1    Subjective Data  Subjective: "I feel weak" Patient Stated Goal: to return home   Cognition  Overall Cognitive Status: Appears within functional limits for tasks assessed/performed Arousal/Alertness: Awake/alert Orientation Level: Appears intact for tasks assessed Behavior During Session: [facility] for tasks performed Cognition - Other Comments: slightly slow/sluggish but functional    Balance  Static Standing Balance Static Standing - Level of Assistance: 5: Stand by assistance  End of Session PT - End of Session Equipment Utilized During Treatment: Gait belt Activity Tolerance: Patient limited by fatigue Patient left: in chair;with call bell/phone within reach Nurse Communication: Mobility status;Other (comment) (RN observed)    Felecia Shelling  PTA [facility]  Acute  Rehab Pager     [phone]

## 2011-12-14 NOTE — Progress Notes (Signed)
Occupational Therapy Treatment Patient Details Name: [name] MRN: [id number] DOB: 01/31/1920 Today's Date: 12/14/2011 Time: 4098-1191 OT Time Calculation (min): 13 min  OT Assessment / Plan / Recommendation Comments on Treatment Session Pt limited this session by fatigue, L hip pain and nausea. Con't to recommend [facility].    Follow Up Recommendations  Skilled nursing facility    Equipment Recommendations  Defer to next venue    Frequency Min 2X/week   Plan Discharge plan remains appropriate    Precautions / Restrictions Precautions Precautions: Fall   Pertinent Vitals/Pain     ADL  Grooming: Performed;Wash/dry hands;Supervision/safety Where Assessed - Grooming: Standing at [employer] Transfer: Performed;Other (comment) (minguard A w/cues for safe manipulation of RW in bathroom) Toilet Transfer Method: Proofreader: Raised toilet seat with arms (or 3-in-1 over toilet) Toileting - Hygiene: Performed;Minimal assistance Where Assessed - Toileting Hygiene: Sit to stand from 3-in-1 or toilet Tub/Shower Transfer: Performed;Minimal assistance    OT Goals ADL Goals ADL Goal: Grooming - Progress: Progressing toward goals ADL Goal: Toilet Transfer - Progress: Progressing toward goals ADL Goal: Toileting - Clothing Manipulation - Progress: Progressing toward goals ADL Goal: Toileting - Hygiene - Progress: Progressing toward goals Miscellaneous OT Goals OT Goal: Miscellaneous Goal #1 - Progress: Progressing toward goals  Visit Information  Last OT Received On: 12/14/11 Assistance Needed: +1 Reason Eval/Treat Not Completed: Patient refused    Subjective Data  Subjective: "I'm so nauseated."   Prior Functioning       Cognition  Overall Cognitive Status: Appears within functional limits for tasks assessed/performed Arousal/Alertness: Awake/alert Orientation Level: Appears intact for tasks assessed Behavior During Session: [facility] for tasks  performed    Mobility Bed Mobility Bed Mobility: Sit to Supine Sit to Supine: 4: Min assist;HOB flat Details for Bed Mobility Assistance: A needed for LLE. Transfers Transfers: Sit to Stand;Stand to Sit Sit to Stand: 4: Min guard;With upper extremity assist;With armrests;From chair/3-in-1 Stand to Sit: 4: Min guard;With upper extremity assist;With armrests;To bed;To chair/3-in-1 Details for Transfer Assistance: Pt states she has a "bad hip and ambulates with a pronounced limp on the L.   Exercises    Balance Static Standing Balance Static Standing - Level of Assistance: 5: Stand by assistance  End of Session OT - End of Session Activity Tolerance: Patient limited by fatigue;Patient limited by pain Patient left: in bed;with call bell/phone within reach   Aleya Durnell A OTR/L [phone] 12/14/2011, 3:08 PM

## 2011-12-15 LAB — GLUCOSE, CAPILLARY: Glucose-Capillary: 95 mg/dL (ref 70–99)

## 2011-12-15 LAB — PREPARE RBC (CROSSMATCH)

## 2011-12-15 MED ORDER — FUROSEMIDE 10 MG/ML IJ SOLN
20.0000 mg | Freq: Once | INTRAMUSCULAR | Status: AC
  Administered 2011-12-16: 20 mg via INTRAVENOUS
  Filled 2011-12-15 (×3): qty 2

## 2011-12-15 NOTE — Progress Notes (Signed)
Per Blood Bank Staff Rosey Bath): Blood will not be available for this pt. Tonight 12/15/11 due to rare blood type.  [facility] will                                                                                                                                                     [facility]  [facility]. Covering M.D. Polite with call back and aware of transfusion delay till morning of 12/16/11.

## 2011-12-15 NOTE — Progress Notes (Signed)
Patient accepted at [facility] whenever patient is cleared for discharge. Daughter to complete paperwork at [facility] today.  Camdyn Laden C. Simran Mannis MSW, LCSW [phone]

## 2011-12-15 NOTE — Progress Notes (Signed)
Subjective: Sityting up in a chair. Looks more perky. Voices no complaints  Objective: Lab: Lab Results  Component Value Date   WBC 6.0 12/09/2011   HGB 7.5* 12/14/2011   HCT 24.5* 12/14/2011   MCV 94.6 12/09/2011   PLT 170 12/09/2011    Imaging: no new imaging   Physical Exam: Filed Vitals:   12/15/11 0501  BP: 157/66  Pulse: 81  Temp: 98 F (36.7 C)  Resp: 16    Intake/Output Summary (Last 24 hours) at 12/15/11 0820 Last data filed at 12/15/11 0551  Gross per 24 hour  Intake    960 ml  Output   2150 ml  Net  -1190 ml   CBG (last 3)   Basename 12/15/11 0729 12/14/11 2058 12/14/11 1705  GLUCAP 95 165* 137*    Cor- 2+ radial, IRIR tachycardic, II/VI systolic murmur Pulm - normal respirations, lungs CTAP Abd- soft, BS + Neuro- A&O x 3    Assessment/Plan: 1. DM - CBGs trending down. ON basal insulin, metformin and sliding scale. Has required 3 units of insulin May 5th and May 6th  Plan - continue present regimen  2. Chronic anemia - Hgb did drop.  Plan - H/H with transfusion if less than 7 g  3. HTN - stable  4. Cardiac - stable  dispo - FL-2 signed. No indication of bed offer. Since may need transfusion - do not anticipate transfer to SNF any earlier than May 8th   Illene Regulus 12/15/2011, 8:19 AM

## 2011-12-15 NOTE — Progress Notes (Signed)
Called MD's office able to talk with [name], relayed Hbg and Hct result.awaiting MD to call back.

## 2011-12-16 LAB — GLUCOSE, CAPILLARY
Glucose-Capillary: 122 mg/dL — ABNORMAL HIGH (ref 70–99)
Glucose-Capillary: 95 mg/dL (ref 70–99)

## 2011-12-16 LAB — HEMOGLOBIN AND HEMATOCRIT, BLOOD: Hemoglobin: 9.8 g/dL — ABNORMAL LOW (ref 12.0–15.0)

## 2011-12-16 MED ORDER — GLUCERNA SHAKE PO LIQD
237.0000 mL | Freq: Two times a day (BID) | ORAL | Status: DC
  Administered 2011-12-17: 237 mL via ORAL
  Filled 2011-12-16 (×2): qty 237

## 2011-12-16 MED ORDER — ONDANSETRON HCL 4 MG/2ML IJ SOLN: 4.0000 mg | Freq: Once | INTRAMUSCULAR | Status: DC

## 2011-12-16 MED ORDER — METOCLOPRAMIDE HCL 5 MG/ML IJ SOLN: 5.0000 mg | Freq: Four times a day (QID) | INTRAMUSCULAR | Status: DC | PRN

## 2011-12-16 MED ORDER — LORAZEPAM 0.5 MG PO TABS
0.5000 mg | ORAL_TABLET | Freq: Four times a day (QID) | ORAL | Status: DC | PRN
  Administered 2011-12-16 – 2011-12-17 (×2): 0.5 mg via ORAL
  Filled 2011-12-16 (×2): qty 1

## 2011-12-16 NOTE — Progress Notes (Signed)
Nutrition Follow-up  Diet Order:  CHO MOD MED, Glucerna Shake once daily Pt c/o nausea at times causing decreased intake.  Only ate tomato soup at lunch.  Meds: Scheduled Meds:   . digoxin  0.125 mg Oral Daily  . enoxaparin  40 mg Subcutaneous Q24H  . feeding supplement  237 mL Oral q1800  . ferrous sulfate  325 mg Oral BID WC  . furosemide  20 mg Intravenous Once  . insulin aspart  0-9 Units Subcutaneous TID WC  . insulin aspart  8 Units Subcutaneous Once  . insulin glargine  15 Units Subcutaneous QHS  . metFORMIN  500 mg Oral BID WC  . metoCLOPramide  5 mg Oral BID  . metoprolol  12.5 mg Oral BID  . ondansetron  4 mg Intravenous Once  . sodium chloride  3 mL Intravenous Q12H  . temazepam  7.5 mg Oral QHS   Continuous Infusions:  PRN Meds:.acetaminophen, acetaminophen, clonazePAM, dextrose, HYDROcodone-acetaminophen, levalbuterol, LORazepam, metoCLOPramide (REGLAN) injection, ondansetron (ZOFRAN) IV, ondansetron, senna  Labs:  CMP     Component Value Date/Time   NA 138 12/09/2011 0506   K 3.1* 12/09/2011 0506   CL 102 12/09/2011 0506   CO2 27 12/09/2011 0506   GLUCOSE 162* 12/09/2011 0506   BUN 39* 12/09/2011 0506   CREATININE 0.70 12/09/2011 0506   CALCIUM 9.2 12/09/2011 0506   PROT 6.0 12/09/2011 0506   ALBUMIN 3.0* 12/09/2011 0506   AST 16 12/09/2011 0506   ALT 14 12/09/2011 0506   ALKPHOS 91 12/09/2011 0506   BILITOT 0.4 12/09/2011 0506   GFRNONAA 75* 12/09/2011 0506   GFRAA 86* 12/09/2011 0506     Intake/Output Summary (Last 24 hours) at 12/16/11 1427 Last data filed at 12/16/11 1418  Gross per 24 hour  Intake  975.5 ml  Output   1626 ml  Net -650.5 ml    Weight Status:  No resent results Wt Readings from Last 3 Encounters:  12/08/11 117 lb 11.6 oz (53.4 kg)  09/15/11 124 lb 9.6 oz (56.518 kg)  07/06/11 124 lb 4.8 oz (56.382 kg)     Re-estimated needs:  1350-1450 kcal, 60-70 g protein  Nutrition Dx:  Inadequate oral intake--ongoing  Goal:  Meet 100% estimated needs with po  meals and supplements.  Intervention:   Pt for d/c to SNF 5/9. Encouraged po, provide preferences Increase Glucerna Shake to bid  Monitor:  Intake, weight trend, labs   Derrell Lolling Anastasia Fiedler Pager #:  [phone]

## 2011-12-16 NOTE — Progress Notes (Signed)
Subjective: Pateint is sitting up in a chair preparing to eat breakfast. No complaints  Objective: Lab: Hgb 5/7 - 7.7 g  Imaging: no new imaging   Physical Exam: Filed Vitals:   12/16/11 0444  BP: 155/69  Pulse: 86  Temp: 98.2 F (36.8 C)  Resp: 18    Intake/Output Summary (Last 24 hours) at 12/16/11 0820 Last data filed at 12/16/11 4540  Gross per 24 hour  Intake   1243 ml  Output   2075 ml  Net   -832 ml   CBG (last 3)   Basename 12/16/11 0735 12/15/11 2115 12/15/11 1704  GLUCAP 122* 223* 119*    Cor - RRR Pulm - no labored respirations.     Assessment/Plan: 1. DM - better control. Continue present regimen  2. Chronic anemia - was to transfuse prior to d/c. Cross-match is difficult - blood won't be ready until later today. Will transfuse 2 u PRBCs  Dispo- d/c delayed due to difficulty with crossmatch. Should be ready for transfer to SNF 5/9   Illene Regulus 12/16/2011, 8:20 AM

## 2011-12-17 LAB — TYPE AND SCREEN
Antibody Screen: NEGATIVE
Donor AG Type: NEGATIVE
Unit division: 0
Unit division: 0

## 2011-12-17 LAB — GLUCOSE, CAPILLARY
Glucose-Capillary: 70 mg/dL (ref 70–99)
Glucose-Capillary: 136 mg/dL — ABNORMAL HIGH (ref 70–99)

## 2011-12-17 MED ORDER — FERROUS SULFATE 325 (65 FE) MG PO TABS: 325.0000 mg | ORAL_TABLET | Freq: Two times a day (BID) | ORAL | Status: DC

## 2011-12-17 MED ORDER — METOCLOPRAMIDE HCL 5 MG PO TABS: 5.0000 mg | ORAL_TABLET | Freq: Two times a day (BID) | ORAL | Status: DC

## 2011-12-17 NOTE — Progress Notes (Signed)
Patient cleared for discharge. Patient accepted at [facility]. Packet copied and placed in [place]. ptar called for transportation. CSW called patients daughter and left voicemail informing of d/c.  Nuha Degner C. Blayre Papania MSW, LCSW [phone]

## 2011-12-17 NOTE — Progress Notes (Signed)
Physical Therapy Treatment Patient Details Name: [name] MRN: [id number] DOB: 06/16/1920 Today's Date: 12/17/2011 Time: 4098-1191 PT Time Calculation (min): 17 min  PT Assessment / Plan / Recommendation Comments on Treatment Session  Pt continues to have limited activity tolerance due to nausea.  Pt states that she feels that she could perform better if she didn't feel so "woozy."  Pt to D/c to SNF today.      Follow Up Recommendations  Skilled nursing facility    Equipment Recommendations  Defer to next venue    Frequency Min 3X/week   Plan Discharge plan remains appropriate    Precautions / Restrictions Precautions Precautions: Fall Restrictions Weight Bearing Restrictions: No   Pertinent Vitals/Pain 0/10 pain, c/o nausea with ambulation    Mobility  Transfers Transfers: Sit to Stand;Stand to Sit Sit to Stand: 4: Min assist;With upper extremity assist;With armrests;From chair/3-in-1;From toilet Stand to Sit: 4: Min assist;With upper extremity assist;With armrests;To chair/3-in-1;To toilet Details for Transfer Assistance: Continues to require min assist for standing from recliner initially due to weakness, however pt able to stand from toilet at supervision level for safety with pt using handrail to stand.  Cues for hand placement, esp when sitting for controlled descent.   Ambulation/Gait Ambulation/Gait Assistance: 4: Min assist Ambulation Distance (Feet): 50 Feet Assistive device: Rolling walker Ambulation/Gait Assistance Details: Continues to have step to pattern with antalgic gait pattern with L stepping.  Cues for upright posture and pursed lip breathing.  Gait Pattern: Step-to pattern;Decreased stance time - left;Decreased step length - right;Decreased stride length;Trunk flexed General Gait Details: Continues to demo limited activity tolerance.  Stairs: No Wheelchair Mobility Wheelchair Mobility: No    Exercises     PT Goals Acute Rehab PT Goals PT Goal  Formulation: With patient Time For Goal Achievement: 12/26/11 Potential to Achieve Goals: Fair Pt will go Sit to Stand: Independently PT Goal: Sit to Stand - Progress: Progressing toward goal Pt will go Stand to Sit: Independently PT Goal: Stand to Sit - Progress: Progressing toward goal Pt will Ambulate: 1 - 15 feet;with modified independence;with rolling walker PT Goal: Ambulate - Progress: Progressing toward goal  Visit Information  Last PT Received On: 12/17/11 Assistance Needed: +1    Subjective Data  Subjective: "I still feel woozy and nauseous" Patient Stated Goal: to return home   Cognition  Overall Cognitive Status: Appears within functional limits for tasks assessed/performed Arousal/Alertness: Awake/alert Orientation Level: Appears intact for tasks assessed Behavior During Session: [facility] for tasks performed    Balance     End of Session PT - End of Session Equipment Utilized During Treatment: Gait belt Activity Tolerance: Patient limited by fatigue;Other (comment) (Limited by nausea) Patient left: in chair;with call bell/phone within reach;with family/visitor present (with chair alarm) Nurse Communication: Mobility status    Page, Meribeth Mattes 12/17/2011, 9:55 AM

## 2011-12-17 NOTE — Progress Notes (Signed)
Report called to [facility] nursing facility. Report given to [facility] the nurse.

## 2011-12-17 NOTE — Care Management Note (Signed)
    Page 1 of 1   12/17/2011     9:55:21 AM   CARE MANAGEMENT NOTE 12/17/2011  Patient:  KENZLI, BARRITT   Account Number:  [id number]  Date Initiated:  12/09/2011  Documentation initiated by:  Lanier Clam  Subjective/Objective Assessment:   ADMITTED W/ELEVATED GLUCOSE, NAUSEA.HX:DM,CHRONIC ANEMIA.     Action/Plan:   FROM HOME ALONE   Anticipated DC Date:  12/17/2011   Anticipated DC Plan:  SKILLED NURSING FACILITY  In-house referral  Clinical Social Worker      DC Planning Services  CM consult      Choice offered to / List presented to:             Status of service:  Completed, signed off Medicare Important Message given?   (If response is "NO", the following Medicare IM given date fields will be blank) Date Medicare IM given:   Date Additional Medicare IM given:    Discharge Disposition:  SKILLED NURSING FACILITY  Per UR Regulation:  Reviewed for med. necessity/level of care/duration of stay  If discussed at Long Length of Stay Meetings, dates discussed:   12/16/2011    Comments:  12/16/11 Beretta Ginsberg RN,BSN NCM [phone] LOW HGB.D/C SNF WHEN MED STABLE.  12/09/11 Chipper Koudelka RN,BSN NCM [phone]

## 2011-12-17 NOTE — Progress Notes (Signed)
Patient is doing well. Hgb post -transfusion 9.8. Diabetes is under better control.   Plan - transfer to SNF  Dictation [phone]

## 2011-12-18 NOTE — Discharge Summary (Signed)
[name], [name]              ACCOUNT NO.:  [id number]  MEDICAL RECORD NO.:  [id number]  LOCATION:  1414                         FACILITY:  [facility]  PHYSICIAN:  Rosalyn Gess. Merita Hawks, MD  DATE OF BIRTH:  1919-12-13  DATE OF ADMISSION:  12/08/2011 DATE OF DISCHARGE:  12/17/2011                              DISCHARGE SUMMARY   ANTICIPATED TRANSFER TO SKILLED NURSING FACILITY:  Dec 17, 2011.  ADMITTING DIAGNOSES: 1. Hyperglycemia, nonketotic. 2. Chronic anemia. 3. Nausea. 4. Mild COPD.  DISCHARGE DIAGNOSES: 1. Hyperglycemia, nonketotic. 2. Chronic anemia. 3. Nausea. 4. Mild COPD.  CONSULTANTS:  None.  PROCEDURES:  Chest x-ray on the day of admission showed enlargement of the cardiac silhouette with pulmonary vascular congestion. Emphysematous changes with minimal right basilar atelectasis.  No acute infiltrate or failure identified.  The patient was transfused 2 units of packed red cells x2 for a total 4 units of packed red cells this admission.  HISTORY OF PRESENT ILLNESS:  [name] is a delightful 78 year old woman with a longstanding history of iron deficiency anemia that has required on periodic transfusion.  She has been followed by Dr. Gwenyth Bouillon for Hematology.  As an outpatient she recently had an H and H, which came back at 8.6 for transfusion threshold.  Two days prior to admission the patient began to experience increased weakness and worsening nausea.  She had no fevers, no chills, no significant shortness of breath, no chest pain.  The patient did have fatigue and had been unable to eat and got too weak to the point where it was difficult for her to get out of bed to the doctor's office. Because of her progressive weakness and symptoms she was brought to the emergency department where she was found in transport by EMS to have a blood sugar greater than 600.  The patient had no problems with dysuria, hematuria, cough, fever or other signs of infection, no  abdominal pain, no primary source of an exacerbation of her diabetes.  Of note, in November at her hospital discharge, her hemoglobin A1c was controlled at 6%, admission A1c was 10.5%.  Please see the H and P for past medical history, family history, past history.  MEDICATIONS:  This admission included Avapro 300 mg daily, Klonopin 0.5 mg b.i.d., Cardura 4 mg at bedtime, multivitamins, Allegra 180 mg daily, Lasix 40 mg twice a day,  Lanoxin 0.125 mg daily, metformin 500 mg b.i.d., Zaroxolyn 2.5 mg in the morning on Monday, Wednesday and Friday, metoprolol 50 mg twice a day, potassium 20 mEq 3 times a day, Phenergan 12.5 mg 1 tablet p.o. q.6h p.r.n., Zocor 20 mg daily, temazepam 15 mg at bedtime p.r.n., trazodone 50 mg at bedtime.  HOSPITAL COURSE: 1. Diabetes.  The patient was initially treated with Glucommander, but     her blood sugar came down, and she was able to be maintained     without this.  She was continued on her metformin.  She was put to     basal insulin therapy.  She was followed with a sliding scale.  On     this regimen the patient's CBGs did improve.  It was felt at this  time that she would be a long-term candidate for basal insulin     therapy.  Her renal function remained good with a creatinine of 0.7     and an estimated GFR of 75 making her a continued candidate for the     use of metformin.  At this point, she is stable in regards to her     diabetes and can be managed with basal insulin therapy as     instructed in her discharge medication list as well as metformin     500 mg b.i.d.  The patient will need to be on a no-sugar, low carb     diet. 2. Anemia.  The patient with significant iron deficiency anemia.  This     is a chronic problem, has been extensively evaluated.  She has been     followed by Dr. Gwenyth Bouillon as an outpatient.  At the time of     admission, the patient's hemoglobin was 7.2 g.  She was transfused     2 units of packed red cells and her  hemoglobin did respond     appropriately rising to 8.8.  Over the next several days her     hemoglobin did begin to drop, it was 7.7 on May 7 at which time she     was transfused an additional 2 units of packed red cells with a     posttransfusion hemoglobin of 9.8 g.  The patient had no evidence     of active GI bleeding.  There were no signs of hemolysis.  The     previous hospitalization the patient had an anemia panel, which     revealed an iron level of 40, which is slightly low; TIBC was 472,     which is normal range; percent iron saturation was 8%; ferritin was     32.  The patient did have colonoscopy in 2011, which was     unremarkable with no source of bleeding.  She did have a polyp that     was removed.  At this point, with her hemoglobin being stable, mom     that she should be able to be transferred to skilled care.  She     will need follow up with Dr. Gwenyth Bouillon as an outpatient. 3. Hypertension.  The patient has been stable during the     hospitalization with no problems with blood pressure.  She is to be     continued on her home medications. 4. Cardiac.  The patient had negative cardiac enzymes x3.  She had no     cardiac complaints. 5. Weakness and deconditioning.  The patient was seen by both physical     therapy and occupational therapy during this hospitalization, it     was felt that she would in fact benefit from rehabilitation in a     skilled care facility.  This has been arranged and the patient will     be discharged/transferred to skilled care when bed is available. With the patient's blood sugars being much better controlled, with her anemia being treated, with her medical condition otherwise being stable, she at this time is ready for transfer to skilled care.  She may participate in all facility protocols.  The patient may have leave of absence with her family.  She will need to follow a diabetic diet-low carb no sugar.  The patient's code status at this  time is a full code.  DISCHARGE EXAMINATION:  VITAL  SIGNS:  Temperature was 98.5, blood pressure 151/57, heart rate 87, respirations 16, O2 sats 93% on room air. GENERAL APPEARANCE:  This is an elderly Caucasian woman lying in bed who is in no acute distress. HEENT EXAM:  Unremarkable, conjunctiva, sclerae being clear. NECK:  Supple. CHEST:  Moving air well.  No rales, wheezes, or rhonchi are appreciated. There is no increased work of breathing. CARDIOVASCULAR:  2+ radial pulse.  Her precordium is quiet.  She had a 2/6 systolic murmur heard best at the right sternal border.  She did have frequent premature ventricular contractions on exam.  She had no JVD, no carotid bruits. ABDOMEN:  Soft.  No guarding or rebound appreciated. EXTREMITIES:  Without deformity. SKIN:  Patient's skin is clear.  Back is without any lesions.  Sacral area is examined and is normal. NEURO:  The patient is awake, alert.  She is oriented to person, place, time, and context.  Formal mental status testing was not performed.  FINAL LABORATORY:  H and H on May 8th at 2025 hours, hemoglobin 9.8 g, hematocrit 30.7%.  Additional laboratory:  Final chemistry panel from May 1 with a sodium 138, potassium 3.1, chloride 102, CO2 of 27, BUN 39, creatinine 0.7, glucose 162.  Liver functions were normal.  Albumin was slightly low at 3, total protein was 6.0.  Cardiac enzymes were done in a serial fashion with 4 studies all with low CK-MB and troponin I less than 0.3.  Anemia panel as referenced above.  The patient did have a full CBC on May 1 with a white count of 6000.  Digoxin level was not checked this admission.  Hemoglobin A1c 10.5% on December 08, 2011.  TSH was normal at 3.23.  IMAGING STUDIES:  As noted.  DISPOSITION:  Patient is to be transferred to a skilled care facility for rehab.  Family  has raised a question about long-term placement needs given her increasing needs for assistance in her activities  of daily living.  This will be reassessed after the patient has completed her rehab program.  CONDITION ON DISCHARGE:  Condition at the time of discharge dictation is medically stable, but guarded given her advanced age and multiple comorbidities.     Rosalyn Gess Fortunata Betty, MD     MEN/MEDQ  D:  12/17/2011  T:  12/18/2011  Job:  [id number]

## 2011-12-30 ENCOUNTER — Ambulatory Visit (INDEPENDENT_AMBULATORY_CARE_PROVIDER_SITE_OTHER): Payer: Medicare Other | Admitting: Physician Assistant

## 2011-12-30 ENCOUNTER — Encounter: Payer: Self-pay | Admitting: Physician Assistant

## 2011-12-30 VITALS — BP 136/60 | HR 69

## 2011-12-30 DIAGNOSIS — D649 Anemia, unspecified: Secondary | ICD-10-CM

## 2011-12-30 DIAGNOSIS — I4891 Unspecified atrial fibrillation: Secondary | ICD-10-CM

## 2011-12-30 DIAGNOSIS — I059 Rheumatic mitral valve disease, unspecified: Secondary | ICD-10-CM

## 2011-12-30 DIAGNOSIS — I5032 Chronic diastolic (congestive) heart failure: Secondary | ICD-10-CM

## 2011-12-30 DIAGNOSIS — I340 Nonrheumatic mitral (valve) insufficiency: Secondary | ICD-10-CM

## 2011-12-30 DIAGNOSIS — I509 Heart failure, unspecified: Secondary | ICD-10-CM

## 2011-12-30 NOTE — Progress Notes (Signed)
[address] [place], [place]  [address] Phone: [phone] Fax:  [phone]  Date:  12/30/2011   Name:  [name]   DOB:  09/10/1919   MRN:  [id number]  PCP:  Illene Regulus, MD, MD  Primary Cardiologist:  Dr. [name]  Primary Electrophysiologist:  None    History of Present Illness: [name] is a 78 y.o. female who returns for follow up.  She has a history of severe three-vessel CAD and severe mitral regurgitation as well as atrial fibrillation (not a Coumadin candidate secondary to fragility, age and anemia).  LHC 4/09: Distal left main 20-30%, proximal LAD 60%, ostial circumflex 80%, mid circumflex 20-30%, proximal RCA 70%, EF 60%.  Last seen by Dr. Eden Emms 02/2011.  At that time he felt that she was too high risk for orthopedic surgery and did not clear her.    She was admitted recently 4/30-5/9 with weakness, worsening anemia and hyperglycemia.  Cardiac markers were negative.  She did receive transfusion with packed red blood cells.  She was discharged to a skilled nursing facility.  Echo 12/09/11: Normal LV wall thickness, EF 60-65%, mild AI, severe MAC, severe MR with flail segment of the anterior mitral valve leaflet, massively dilated bilateral atria, moderate TR, PASP 86(Severe pulmonary hypertension).  She is now at [facility].  I have received labs drawn 12/18/11: Hemoglobin 9.6, platelets 192, creatinine 0.79, potassium 4.5.  She has had some increased lower extremity edema recently.  She takes Lasix twice a day and metolazone 3 times a week.  Apparently, her edema has improved somewhat in the last few days.  There has been some redness.  She denies fevers or pain.  She denies chest discomfort.  She denies orthopnea, PND.  She denies syncope.   Wt Readings from Last 3 Encounters:  12/08/11 117 lb 11.6 oz (53.4 kg)  09/15/11 124 lb 9.6 oz (56.518 kg)  07/06/11 124 lb 4.8 oz (56.382 kg)     Potassium  Date/Time Value  Range Status  12/09/2011  5:06 AM 3.1* 3.5-5.1 (mEq/L) Final     Creatinine, Ser  Date/Time Value Range Status  12/09/2011  5:06 AM 0.70  0.50-1.10 (mg/dL) Final     Hemoglobin  Date/Time Value Range Status  12/16/2011  8:25 PM 9.8* 12.0-15.0 (g/dL) Final        Past Medical History  Diagnosis Date  . Inguinal hernia   . Mitral valve insufficiency and aortic valve insufficiency   . Other and unspecified hyperlipidemia   . Pneumonia   . Osteoarthrosis, unspecified whether generalized or localized, unspecified site   . Nephrolithiasis   . Unspecified essential hypertension   . Type II or unspecified type diabetes mellitus without mention of complication, not stated as uncontrolled   . Coronary atherosclerosis of unspecified type of vessel, native or graft   . Congestive heart failure, unspecified   . Atrial fibrillation   . Unspecified asthma   . Anemia, unspecified   . Allergic rhinitis, cause unspecified   . Colon polyps 10/2009    Current Outpatient Prescriptions  Medication Sig Dispense Refill  . AVAPRO 300 MG tablet TAKE ONE TABLET BY MOUTH EVERY DAY  90 each  3  . clonazePAM (KLONOPIN) 0.5 MG tablet TAKE ONE TABLET BY MOUTH TWICE DAILY  60 tablet  1  . doxazosin (CARDURA) 4 MG tablet TAKE ONE TABLET BY MOUTH AT BEDTIME FOR BLOOD PRESSURE.  30 tablet  5  . Fe Fum-FePoly-FA-Vit  C-Vit B3 (INTEGRA F) 125-1 MG CAPS Take 1 capsule by mouth at bedtime.       . FeFum-FePoly-FA-B Cmp-C-Biot (INTEGRA PLUS) CAPS Take 1 capsule by mouth daily.       . ferrous sulfate 325 (65 FE) MG tablet Take 1 tablet (325 mg total) by mouth 2 (two) times daily with a meal.  60 tablet  11  . fexofenadine (ALLEGRA) 180 MG tablet Take 180 mg by mouth daily.       . furosemide (LASIX) 40 MG tablet TAKE ONE TABLET BY MOUTH TWICE DAILY  60 tablet  12  . LANOXIN 0.125 MG tablet TAKE ONE TABLET BY MOUTH EVERY DAY  30 each  1  . metFORMIN (GLUCOPHAGE) 500 MG tablet TAKE ONE TABLET BY MOUTH EVERY DAY  90  tablet  3  . metolazone (ZAROXOLYN) 2.5 MG tablet TAKE ONE TABLET BY MOUTH EVERY MORNING ON MONDAY, WEDNESDAY, AND FRIDAY 30 MINUTES BEFORE TAKING FUROSEMIDE.  30 tablet  6  . metoprolol (LOPRESSOR) 50 MG tablet TAKE ONE TABLET BY MOUTH TWICE DAILY  60 tablet  5  . potassium chloride SA (KLOR-CON M20) 20 MEQ tablet 1 tab by mouth three times per day  90 tablet  5  . promethazine (PHENERGAN) 12.5 MG tablet TAKE ONE TABLET BY MOUTH EVERY 6 HOURS AS NEEDED FOR NAUSEA  30 tablet  1  . temazepam (RESTORIL) 15 MG capsule TAKE ONE CAPSULE BY MOUTH AT BEDTIME  30 capsule  5  . traZODone (DESYREL) 50 MG tablet       . metoCLOPramide (REGLAN) 5 MG tablet Take 1 tablet (5 mg total) by mouth 2 (two) times daily.  60 tablet  11    Allergies: Allergies  Allergen Reactions  . Codeine     REACTION: Hives    History  Substance Use Topics  . Smoking status: Never Smoker   . Smokeless tobacco: Never Used  . Alcohol Use: No     ROS:  Please see the history of present illness.     All other systems reviewed and negative.   PHYSICAL EXAM: VS:  BP 136/60  Pulse 69 Well nourished, well developed, in no acute distress HEENT: normal Neck: + JVD at 90 degrees Cardiac:  normal S1, S2; irregularly irregular rhythm; 3/6 holosystolic murmur along LSB Lungs:  clear to auscultation bilaterally, no wheezing, rhonchi or rales Abd: soft, nontender Ext: 2+ bilateral LE edema Skin: warm and dry Neuro:  CNs 2-12 intact, no focal abnormalities noted  EKG:  Afib, HR 69, lateral ST changes, no change from prior tracing   ASSESSMENT AND PLAN:  1.  Chronic Diastolic CHF in the setting of Severe Mitral Regurgitation She is chronically volume overloaded. This is likely exacerbated by chronic AFib and chronic anemia. I will try to make her more comfortable by adjusting her diuresis. Adjust Lasix to 60 mg BID. Increase K+ to 40 mEq BID. Check BMET in one week. We discussed keeping her legs elevated.  2.  Severe  Mitral Regurgitation She is not a surgical candidate. Continue medical management. Adjust Lasix as noted. Follow up with Dr. [name] in 2 mos.  3.  Coronary Artery Disease She is not a surgical candidate. Continue conservative management. Follow up with Dr. [name] in 2 mos.   4.  Chronic Anemia Followed by hematology.    Luna Glasgow, PA-C  12:44 PM 12/30/2011

## 2011-12-30 NOTE — Patient Instructions (Addendum)
Your physician has recommended you make the following change in your medication: INCREASE Furosemide to 60mg  twice a day (40mg  tablet --take one and one-half tablet by mouth twice a day), INCREASE Potassium Chloride to twice a day  Your physician recommends that you have lab work in: 1 WEEK (BMP)--this can be drawn at [facility] and faxed to our office at [phone]  Your physician recommends that you schedule a follow-up appointment in: 2 MONTHS with Dr Eden Emms

## 2012-01-20 ENCOUNTER — Other Ambulatory Visit (INDEPENDENT_AMBULATORY_CARE_PROVIDER_SITE_OTHER): Payer: Medicare Other

## 2012-01-20 ENCOUNTER — Telehealth: Payer: Self-pay

## 2012-01-20 ENCOUNTER — Ambulatory Visit (INDEPENDENT_AMBULATORY_CARE_PROVIDER_SITE_OTHER): Payer: Medicare Other | Admitting: Internal Medicine

## 2012-01-20 ENCOUNTER — Encounter: Payer: Self-pay | Admitting: Internal Medicine

## 2012-01-20 VITALS — BP 132/58 | HR 80 | Temp 98.8°F | Resp 16 | Wt 130.0 lb

## 2012-01-20 DIAGNOSIS — I509 Heart failure, unspecified: Secondary | ICD-10-CM

## 2012-01-20 DIAGNOSIS — E8770 Fluid overload, unspecified: Secondary | ICD-10-CM

## 2012-01-20 DIAGNOSIS — E119 Type 2 diabetes mellitus without complications: Secondary | ICD-10-CM

## 2012-01-20 DIAGNOSIS — R0989 Other specified symptoms and signs involving the circulatory and respiratory systems: Secondary | ICD-10-CM

## 2012-01-20 DIAGNOSIS — I10 Essential (primary) hypertension: Secondary | ICD-10-CM

## 2012-01-20 DIAGNOSIS — E8779 Other fluid overload: Secondary | ICD-10-CM

## 2012-01-20 DIAGNOSIS — D649 Anemia, unspecified: Secondary | ICD-10-CM

## 2012-01-20 DIAGNOSIS — R0609 Other forms of dyspnea: Secondary | ICD-10-CM

## 2012-01-20 DIAGNOSIS — I2510 Atherosclerotic heart disease of native coronary artery without angina pectoris: Secondary | ICD-10-CM

## 2012-01-20 MED ORDER — TRAMADOL-ACETAMINOPHEN 37.5-325 MG PO TABS: 1.0000 | ORAL_TABLET | Freq: Four times a day (QID) | ORAL | Status: AC | PRN

## 2012-01-20 NOTE — Telephone Encounter (Signed)
Audry Riles with occupational therapy called to inform MD that he spoke with pt daughter who declined OT.  She stated to him that pt has OT ordered after every hospital d/c, which has been frequently Thanks

## 2012-01-20 NOTE — Telephone Encounter (Signed)
Noted  

## 2012-01-20 NOTE — Patient Instructions (Addendum)
Diabetes- lab today. Be sure to eat three meals a day and a snack  All the medications are correct - see this list.  Anemia - will check today  Heat failure - will check labs today for kidney, potassium and fluid.   Call me for problems otherwise return in 1 month.

## 2012-01-21 ENCOUNTER — Telehealth: Payer: Self-pay | Admitting: *Deleted

## 2012-01-21 LAB — COMPREHENSIVE METABOLIC PANEL
ALT: 21 U/L (ref 0–35)
CO2: 34 mEq/L — ABNORMAL HIGH (ref 19–32)
Calcium: 9.7 mg/dL (ref 8.4–10.5)
Chloride: 100 mEq/L (ref 96–112)
Creatinine, Ser: 0.8 mg/dL (ref 0.4–1.2)
GFR: 69.65 mL/min (ref >60.00)
Glucose, Bld: 116 mg/dL — ABNORMAL HIGH (ref 70–99)
Total Bilirubin: 1.6 mg/dL — ABNORMAL HIGH (ref 0.3–1.2)

## 2012-01-21 LAB — CBC WITH DIFFERENTIAL/PLATELET
Basophils Relative: 0.4 % (ref 0.0–3.0)
Eosinophils Relative: 3.2 % (ref 0.0–5.0)
HCT: 33 % — ABNORMAL LOW (ref 36.0–46.0)
Hemoglobin: 10.6 g/dL — ABNORMAL LOW (ref 12.0–15.0)
Lymphocytes Relative: 12.1 % (ref 12.0–46.0)
Lymphs Abs: 0.5 10*3/uL — ABNORMAL LOW (ref 0.7–4.0)
Monocytes Relative: 11.7 % (ref 3.0–12.0)
Neutro Abs: 2.9 10*3/uL (ref 1.4–7.7)
RBC: 3.64 Mil/uL — ABNORMAL LOW (ref 3.87–5.11)
WBC: 4.1 10*3/uL — ABNORMAL LOW (ref 4.5–10.5)

## 2012-01-21 LAB — BRAIN NATRIURETIC PEPTIDE: Pro B Natriuretic peptide (BNP): 489 pg/mL — ABNORMAL HIGH (ref 0.0–100.0)

## 2012-01-21 MED ORDER — GLUCOSE BLOOD VI STRP: ORAL_STRIP | Status: DC

## 2012-01-21 NOTE — Telephone Encounter (Signed)
Patient called request a copy of the instructions you gave her concerning her ear problem. She would like [place] to mail that to her. Address  [address]   [place], [place]  [address]. Thanks.

## 2012-01-21 NOTE — Telephone Encounter (Signed)
Verbal order given to Jan, Physical Therapist [facility], for patient.; Also asked concerning Ecologist for patient

## 2012-01-21 NOTE — Telephone Encounter (Signed)
Disregard message concerning instruction for ear problem. Error charting into wrong chart.[name] [name] 01/21/2012

## 2012-01-21 NOTE — Telephone Encounter (Signed)
Ok for PT order as requested.  OK to mail patient instruction sheet from ov.  Labs are good: Hgb 10.6 which is very good for her. A1C 6.9% which is good as well.

## 2012-01-21 NOTE — Telephone Encounter (Signed)
Physical Therapist Jan called from [facility] requesting verbal orders for PT (3/week x 3 week and 2/week x 4 weeks) and social worker consult. Verbal can be called to [phone]

## 2012-01-22 ENCOUNTER — Telehealth: Payer: Self-pay | Admitting: Internal Medicine

## 2012-01-22 NOTE — Telephone Encounter (Signed)
Message on pt's home number.

## 2012-01-22 NOTE — Telephone Encounter (Signed)
HHRN called to inform MD that pt's CBG after dinner 06/13 - 207 and 188 fasting this morning. Pt has been c/o slight dizziness and nausea and has some LE edema.  HHRN advised MD out of office until 06/17 - response not urgent.

## 2012-01-22 NOTE — Telephone Encounter (Signed)
Blood sugar levels acceptable. No change in medication. For progressive symptoms over the weekend - ED eval

## 2012-01-22 NOTE — Telephone Encounter (Signed)
Calling for labs done last week.

## 2012-01-24 ENCOUNTER — Encounter: Payer: Self-pay | Admitting: Internal Medicine

## 2012-01-24 NOTE — Assessment & Plan Note (Signed)
Stable with no signs of decompensation. She is folllowed closely by cardiology who will adjust her medications as needed.  Plan Continue present medical regimen.

## 2012-01-24 NOTE — Assessment & Plan Note (Signed)
Patient went out of control for unknown reasons. She is now on basal insulin therapy and metformin and has been doing well at [facility].  Plan -  Follow-up lab.  Addendum - A1C 6.9% - good control.

## 2012-01-24 NOTE — Progress Notes (Signed)
Subjective:    Patient ID: [name], female    DOB: May 09, 1920, 78 y.o.   MRN: [id number]  HPI [name] presents for follow -up having just returned home from SNF. She had been hospitalized recently for non-ketotic hyperglycemia - hospital records reviewed. She has an initial serum glucose of 600. A1C 10.5% up from normal range 6 months ago. She was found to have a creatinine of  0.7 and was continued on metformin. Basal insulin therapy was added. She had good control at time of hospital discharge. For her chronic anemia she did receive 4 units of pRBCs. Her iron saturation that admission was 8%. Cardiac enzymes were negative.  During her stay at SNF [facility]) she did well: had a good appetite and gained some weight; participated in PT. She has only been home a few days and she will need HHRN/PT. For long term care her family has been discussing and investigating AL.  Past Medical History  Diagnosis Date  . Inguinal hernia   . Mitral valve insufficiency and aortic valve insufficiency   . Other and unspecified hyperlipidemia   . Pneumonia   . Osteoarthrosis, unspecified whether generalized or localized, unspecified site   . Nephrolithiasis   . Unspecified essential hypertension   . Type II or unspecified type diabetes mellitus without mention of complication, not stated as uncontrolled   . Coronary atherosclerosis of unspecified type of vessel, native or graft   . Congestive heart failure, unspecified   . Atrial fibrillation   . Unspecified asthma   . Anemia, unspecified   . Allergic rhinitis, cause unspecified   . Colon polyps 10/2009   Past Surgical History  Procedure Date  . Breast cyst aspiration     x50  . Abdominal hysterectomy 1990  . Lumbar fusion 2007    [facility]  . Total hip arthroplasty 01/2007    Right - Dr Valentina Gu  . Inguinal hernia repair 2010    [facility]   Family History  Problem Relation Age of Onset  . Heart disease Father     CHF  . Lymphoma  Sister   . Colon cancer Neg Hx    History   Social History  . Marital Status: Widowed    Spouse Name: N/A    Number of Children: N/A  . Years of Education: N/A   Occupational History  . Not on file.   Social History Main Topics  . Smoking status: Never Smoker   . Smokeless tobacco: Never Used  . Alcohol Use: No  . Drug Use: No  . Sexually Active: Not on file   Other Topics Concern  . Not on file   Social History Narrative   Married '42 - 38 years - Widowed1 son - '50, 1 dtr - '464 grandchildrenRegular Exercise -  NOLives alone - I ADLs, including drivingEnd of life issues: no prolonged mechanical vent. Suport. Laymen's guide to death with dignity provided with a suggestion that she discuss these issues with her family. Her son was supportive of having this discussion.       Review of Systems System review is negative for any constitutional, cardiac, pulmonary, GI or neuro symptoms or complaints other than as described in the HPI.     Objective:   Physical Exam Filed Vitals:   01/20/12 1623  BP: 132/58  Pulse: 80  Temp: 98.8 F (37.1 C)  Resp: 16   Wt Readings from Last 3 Encounters:  01/20/12 130 lb (58.968 kg)  12/08/11 117 lb 11.6  oz (53.4 kg)  09/15/11 124 lb 9.6 oz (56.518 kg)   Gen'l - elderly, frail white woman in no distress HEENT- C&S clear Cor - RRR Pulm - normal respirations without increased WOB Neuro - A&O x 3  Lab Results  Component Value Date   WBC 4.1* 01/20/2012   HGB 10.6* 01/20/2012   HCT 33.0* 01/20/2012   PLT 119.0* 01/20/2012   GLUCOSE 116* 01/20/2012                       ALT 21 01/20/2012   AST 27 01/20/2012   NA 142 01/20/2012   K 3.3* 01/20/2012   CL 100 01/20/2012   CREATININE 0.8 01/20/2012   BUN 44* 01/20/2012   CO2 34* 01/20/2012   TSH 3.234 12/08/2011   INR 1.14 10/09/2009   HGBA1C 6.9* 01/20/2012          Assessment & Plan:

## 2012-01-24 NOTE — Assessment & Plan Note (Signed)
BP Readings from Last 3 Encounters:  01/20/12 132/58  12/30/11 136/60  12/17/11 155/62   Good control on present regimen  Plan No change in medications.

## 2012-01-24 NOTE — Assessment & Plan Note (Signed)
No c/o chest pain or discomfort. She has been hemodynamically stable.  Plan Continue present medications  Routine follow-up with cardiology

## 2012-01-24 NOTE — Assessment & Plan Note (Signed)
Chronic iron deficiency anemia followed by Dr. Arbutus Ped. At last admission she did receive 4 units of packed cells. She had colonoscopy in '11 and at 45 - will not repeat study.  Plan Iron supplementation  Follow-up with Dr. Arbutus Ped

## 2012-01-25 ENCOUNTER — Telehealth: Payer: Self-pay | Admitting: *Deleted

## 2012-01-25 NOTE — Telephone Encounter (Signed)
Long known that patient needs congregate living, i.e. AL. Family also knew this and talked about it. Unfortunately I cannot do anything about this - it will be up to the family.

## 2012-01-25 NOTE — Telephone Encounter (Signed)
Left message with [facility] nurse Ronald Lobo regarding situation of care for patient.

## 2012-01-25 NOTE — Telephone Encounter (Signed)
[facility] nurse Ronald Lobo called to give report on patient who is new to them since June 9th. States that patient is by herself except when daughter comes over to check on her and has noticed she is not eating or drinking liquids. States today she does not feel well. Dizzy,stays in bed all of time, nausea this am. Daughter did get her to eat some rasin bran cereal and is a little better now. Only checking Blood Sugars one time a day in the mornings . This am was 146 BS,, sun 159, sat. 164, fri. 187; Blood pressure this am 152/68, HR 68 irregular. Distended abdomen, lower extremity swelling better this morning.. Nurse states forgetfulness is worsening.

## 2012-01-27 ENCOUNTER — Telehealth: Payer: Self-pay

## 2012-01-27 DIAGNOSIS — R413 Other amnesia: Secondary | ICD-10-CM

## 2012-01-27 NOTE — Telephone Encounter (Signed)
PT called requesting verbal order social worker assessment because pt's son and daughter-in-law are having difficulty managing pt in home - memory issues.

## 2012-01-28 ENCOUNTER — Telehealth: Payer: Self-pay

## 2012-01-28 ENCOUNTER — Telehealth: Payer: Self-pay | Admitting: *Deleted

## 2012-01-28 NOTE — Telephone Encounter (Signed)
Spoke with PT [facility] Explained Dr. Debby Bud has sent referral to [facility] for placement in AL facility.  Physical Therapist notified of this  And will contact [facility] of this

## 2012-01-28 NOTE — Telephone Encounter (Signed)
PT informed and will advise pt's family of referral.

## 2012-01-28 NOTE — Telephone Encounter (Signed)
The social work consult should have placed with Amydis (sic) who is already seeing the patient. PCC;'s please put in the request with the correct and current agency!

## 2012-01-28 NOTE — Telephone Encounter (Signed)
OK. [facility] notified. Pt needs placement AL - memory unit.

## 2012-01-28 NOTE — Telephone Encounter (Signed)
Social worker called stating that she would be able to evaluate pt for placement and community resources. MD placed a referral placed for AL placement with [facility] also. Pt is receiving PT with [facility] and cannot receive services from more than one agency. Okay to cancel [facility] referral?

## 2012-01-29 NOTE — Telephone Encounter (Signed)
HH Social Worker with [facility] advised of same, [facility] order cancelled.

## 2012-02-01 ENCOUNTER — Telehealth: Payer: Self-pay

## 2012-02-01 NOTE — Telephone Encounter (Signed)
These are stable vital signs. Do not have suggestions for malaise. Is there another problem?

## 2012-02-01 NOTE — Telephone Encounter (Signed)
Pt's daughter called requesting pt start taking Lantus in the evening rather than in the morning. Pt has already had insulin this morning - fasting cbg 1664. Please advise.

## 2012-02-01 NOTE — Telephone Encounter (Signed)
PT called to inform MD pt's BP- 150/60, pulse 78. Temp 99.2, respirations 24 and general malaise. PT is requesting any recommendations MD may have.

## 2012-02-01 NOTE — Telephone Encounter (Signed)
Med updated, daughter advised.

## 2012-02-01 NOTE — Telephone Encounter (Signed)
lantus should be taken in the PM

## 2012-02-02 ENCOUNTER — Telehealth: Payer: Self-pay | Admitting: Internal Medicine

## 2012-02-02 NOTE — Telephone Encounter (Signed)
HHPT did not mention any other problems with this pt.

## 2012-02-02 NOTE — Telephone Encounter (Signed)
Forward to Dr. Debby Bud for review on 02-02-12 ym

## 2012-02-04 ENCOUNTER — Telehealth: Payer: Self-pay | Admitting: *Deleted

## 2012-02-04 NOTE — Telephone Encounter (Signed)
Social Worker with [facility] called to request  3 more Home Visit  With patient to continue to assist and evaluate decisions of the family for assisted living  And alternative care for her safety in her home.   Verbal Order per Dr, Debby Bud may have 3 more visit to assist in this. Left message on social worker Janiese cell number [phone].

## 2012-02-09 ENCOUNTER — Other Ambulatory Visit: Payer: Self-pay | Admitting: Internal Medicine

## 2012-02-09 NOTE — Telephone Encounter (Signed)
Returned call from [facility] home health nurse, Johnny Bridge, concerned of patient Blood Sugar being 298 at 4 pm 02/08/2012. Dr. Debby Bud notified. No new orders given. Nurse Johnny Bridge discovered after concersation that patient had not taking morning medications. Johnny Bridge will follow up next home health visit on Friday with patient.

## 2012-02-16 ENCOUNTER — Telehealth: Payer: Self-pay | Admitting: Internal Medicine

## 2012-02-17 MED ORDER — INSULIN GLARGINE 100 UNIT/ML ~~LOC~~ SOLN: 15.0000 [IU] | Freq: Every day | SUBCUTANEOUS | Status: DC

## 2012-02-17 NOTE — Telephone Encounter (Signed)
Called: spoke with Sharlot Gowda: Increase lantus to 15 units at bedtime Check AM CBGs and keep record Call back Monday with report.

## 2012-02-17 NOTE — Telephone Encounter (Signed)
Pt's daughter called again regarding pt's blood sugar - high 200 x 2 days. Daughter [name]) is requesting a call back with MD instructions.

## 2012-02-19 ENCOUNTER — Telehealth: Payer: Self-pay

## 2012-02-19 NOTE — Telephone Encounter (Signed)
Verbal authorization given to [facility] per [facility]

## 2012-02-19 NOTE — Telephone Encounter (Signed)
Pt's daughter came into clinic stating that pt has been "feeling really bad" and is anemic. Daughter is requesting order for [facility] to draw hemoglobin [facility] is scheduled at pt's home today at 1:30). Daughter is requesting authorization be called to [facility] at [phone], please advise on this MEN pt, thanks!

## 2012-02-22 ENCOUNTER — Other Ambulatory Visit: Payer: Self-pay | Admitting: *Deleted

## 2012-02-22 ENCOUNTER — Telehealth: Payer: Self-pay | Admitting: Internal Medicine

## 2012-02-22 ENCOUNTER — Telehealth: Payer: Self-pay

## 2012-02-22 NOTE — Telephone Encounter (Signed)
Please see phone note from 07/12. Has HH faxed results to MEN's attn?

## 2012-02-22 NOTE — Telephone Encounter (Signed)
PT with [facility]  Called to notify of BP reading..100/38. Of patient. Pt. States she  Does not feel dizzy just tired. She states she is not eatting.. Nurse Johnny Bridge with [facility] aware of response concerning Blood sugar. Is goinig back to  Patient home now and take her BP.

## 2012-02-22 NOTE — Telephone Encounter (Signed)
Nurse from [facility] called to see if we had received lab results faxed for Friday H&H.Marland Kitchen Also called to note that patient has been nauseated and in pain per statement of daughter. Blood sugars are in high 200 range even after increase of insulin to 15 units. States the family took to as assisted living facility to look at and patient refused to be a resident there. Nurse also asked about her blood transfusions. Please advise. Labs faxed here again for you to review .

## 2012-02-22 NOTE — Telephone Encounter (Signed)
HGb was 9.6 which is very good for her. The family was supposed to forward to me CBG readings which they haven't done.   No need for transfusion. No change in diabetes regimen at this time. Family to forward fasting CBGs so that I know how to adjust her lantus dose.

## 2012-02-22 NOTE — Telephone Encounter (Signed)
[facility] Triage Call Report Triage Record Num: [id number] Operator: Griselda Miner Patient Name: Danaysia Rader Call Date & Time: 02/20/2012 9:45:07AM Patient Phone: [phone] PCP: Illene Regulus Patient Gender: Female PCP Fax : [phone] Patient DOB: [birth year] Practice Name: [facility] Reason for Call: Caller: [name]; PCP: Illene Regulus; CB#: [phone]; ; ; Call regarding Regarding Lab; RN notes order given for lab and it was drawn on 02/19/12; No results back. Informed caller and reassured that when it comes back if there are concerning values they would be notified. Caller demonstrated understanding with no futher questions. Protocol(s) Used: Office Note Recommended Outcome per Protocol: Information Noted and Sent to Office Reason for Outcome: Caller information to office Care Advice: ~ 02/20/2012 10:04:46AM Page 1 of 1 [id number]

## 2012-02-22 NOTE — Telephone Encounter (Signed)
greg aware of appts   aom

## 2012-02-23 ENCOUNTER — Telehealth: Payer: Self-pay | Admitting: *Deleted

## 2012-02-23 DIAGNOSIS — Z0279 Encounter for issue of other medical certificate: Secondary | ICD-10-CM

## 2012-02-23 NOTE — Telephone Encounter (Signed)
Patient daughter, [name], came to office to speak with someone about her mother and wanted to know why she was not informed of lab results that were done last week on her mother. Wanted to know what was her mothers condition and wanted to know is her mother dying. Explained to patient was unaware we were to have called her with lab results. Results were faxed here by [facility] nurse Johnny Bridge, and were sent to Dr, Debby Bud with a response of lab work ok for the patient and Johnny Bridge was informed of this and no further orders given. [name] was in a hurry and could not wait to speak with Dr, Debby Bud concerning her mother condition.

## 2012-02-24 ENCOUNTER — Other Ambulatory Visit: Payer: Medicare Other | Admitting: Lab

## 2012-02-24 ENCOUNTER — Encounter: Payer: Self-pay | Admitting: Internal Medicine

## 2012-02-24 ENCOUNTER — Telehealth: Payer: Self-pay | Admitting: Medical Oncology

## 2012-02-24 ENCOUNTER — Ambulatory Visit: Payer: Medicare Other | Admitting: Internal Medicine

## 2012-02-24 ENCOUNTER — Ambulatory Visit (INDEPENDENT_AMBULATORY_CARE_PROVIDER_SITE_OTHER): Payer: Medicare Other | Admitting: Internal Medicine

## 2012-02-24 ENCOUNTER — Telehealth: Payer: Self-pay | Admitting: Internal Medicine

## 2012-02-24 VITALS — BP 114/56 | HR 77 | Temp 98.5°F | Resp 14

## 2012-02-24 DIAGNOSIS — I10 Essential (primary) hypertension: Secondary | ICD-10-CM

## 2012-02-24 DIAGNOSIS — E119 Type 2 diabetes mellitus without complications: Secondary | ICD-10-CM

## 2012-02-24 DIAGNOSIS — D649 Anemia, unspecified: Secondary | ICD-10-CM

## 2012-02-24 DIAGNOSIS — R413 Other amnesia: Secondary | ICD-10-CM

## 2012-02-24 DIAGNOSIS — I509 Heart failure, unspecified: Secondary | ICD-10-CM

## 2012-02-24 MED ORDER — METFORMIN HCL 500 MG PO TABS: 500.0000 mg | ORAL_TABLET | Freq: Two times a day (BID) | ORAL | Status: DC

## 2012-02-24 MED ORDER — METOPROLOL TARTRATE 50 MG PO TABS: 25.0000 mg | ORAL_TABLET | Freq: Two times a day (BID) | ORAL | Status: DC

## 2012-02-24 NOTE — Telephone Encounter (Signed)
Son said pt not feeling well and will not be in today . He will call her PCP .

## 2012-02-24 NOTE — Progress Notes (Signed)
Subjective:    Patient ID: [name], female    DOB: Jul 09, 1920, 78 y.o.   MRN: [id number]  HPI [name] presents with her son. The issues are poor control of blood sugar and generalized weakness. She continues to live independently and this is also discussed. Her home CBG records does reveal CBGs in 200-300 range. She has been asymptomatic. Her current dose of lantus is 15 units qHS along with metformin bid.  Lab Results  Component Value Date   HGBA1C 6.9* 01/20/2012   Lab Results  Component Value Date   HGB 10.6* 01/20/2012   Discussed various reasons for her overall weakness: not anemia or elevated CBGs but certainly related to age, heart condition and other comorbid factors. Emphasized to her the need to consider congregate living due to her age, co-morbidities and weakness.  Past Medical History  Diagnosis Date  . Inguinal hernia   . Mitral valve insufficiency and aortic valve insufficiency   . Other and unspecified hyperlipidemia   . Pneumonia   . Osteoarthrosis, unspecified whether generalized or localized, unspecified site   . Nephrolithiasis   . Unspecified essential hypertension   . Type II or unspecified type diabetes mellitus without mention of complication, not stated as uncontrolled   . Coronary atherosclerosis of unspecified type of vessel, native or graft   . Congestive heart failure, unspecified   . Atrial fibrillation   . Unspecified asthma   . Anemia, unspecified   . Allergic rhinitis, cause unspecified   . Colon polyps 10/2009   Past Surgical History  Procedure Date  . Breast cyst aspiration     x50  . Abdominal hysterectomy 1990  . Lumbar fusion 2007    Jenkins  . Total hip arthroplasty 01/2007    Right - Dr Valentina Gu  . Inguinal hernia repair 2010    Rosenbower   Family History  Problem Relation Age of Onset  . Heart disease Father     CHF  . Lymphoma Sister   . Colon cancer Neg Hx    History   Social History  . Marital Status: Widowed    Spouse Name: N/A    Number of Children: N/A  . Years of Education: N/A   Occupational History  . Not on file.   Social History Main Topics  . Smoking status: Never Smoker   . Smokeless tobacco: Never Used  . Alcohol Use: No  . Drug Use: No  . Sexually Active: Not on file   Other Topics Concern  . Not on file   Social History Narrative   Married '42 - 38 years - Widowed1 son - '50, 1 dtr - '464 grandchildrenRegular Exercise -  NOLives alone - I ADLs, including drivingEnd of life issues: no prolonged mechanical vent. Suport. Laymen's guide to death with dignity provided with a suggestion that she discuss these issues with her family. Her son was supportive of having this discussion.    Current Outpatient Prescriptions on File Prior to Visit  Medication Sig Dispense Refill  . Cholecalciferol (VITAMIN D3) 1000 UNITS CAPS Take by mouth daily.      . clonazePAM (KLONOPIN) 0.5 MG tablet TAKE ONE TABLET BY MOUTH TWICE DAILY  60 tablet  1  . doxazosin (CARDURA) 4 MG tablet TAKE ONE TABLET BY MOUTH AT BEDTIME FOR BLOOD PRESSURE.  30 tablet  5  . Fe Fum-FePoly-FA-Vit C-Vit B3 (INTEGRA F) 125-1 MG CAPS Take 1 capsule by mouth at bedtime.       . FeFum-FePoly-FA-B  Cmp-C-Biot (INTEGRA PLUS) CAPS Take 1 capsule by mouth daily.       . ferrous sulfate 325 (65 FE) MG tablet Take 1 tablet (325 mg total) by mouth 2 (two) times daily with a meal.  60 tablet  11  . fexofenadine (ALLEGRA) 180 MG tablet Take 180 mg by mouth daily.       . furosemide (LASIX) 40 MG tablet Take 1.5 tablets (60 mg total) by mouth 2 (two) times daily.  1 tablet  0  . glucose blood test strip Test 2 (two) times daily. DX 250.00  100 each  11  . insulin glargine (LANTUS SOLOSTAR) 100 UNIT/ML injection Inject 15 Units into the skin at bedtime.  10 mL    . LANOXIN 0.125 MG tablet TAKE ONE TABLET BY MOUTH EVERY DAY  30 each  5  . meclizine (ANTIVERT) 25 MG tablet Take 25 mg by mouth 3 (three) times daily as needed. For dizziness       . metFORMIN (GLUCOPHAGE) 500 MG tablet Take 1 tablet (500 mg total) by mouth 2 (two) times daily.  180 tablet  3  . metolazone (ZAROXOLYN) 2.5 MG tablet TAKE ONE TABLET BY MOUTH EVERY MORNING ON MONDAY, WEDNESDAY, AND FRIDAY 30 MINUTES BEFORE TAKING FUROSEMIDE.  30 tablet  6  . metoprolol (LOPRESSOR) 50 MG tablet Take 0.5 tablets (25 mg total) by mouth 2 (two) times daily.  60 tablet  5  . potassium chloride SA (KLOR-CON M20) 20 MEQ tablet Take 2 tablets (40 mEq total) by mouth 2 (two) times daily.  1 tablet  0  . promethazine (PHENERGAN) 12.5 MG tablet TAKE ONE TABLET BY MOUTH EVERY 6 HOURS AS NEEDED FOR NAUSEA  30 tablet  1  . irbesartan (AVAPRO) 300 MG tablet TAKE ONE TABLET BY MOUTH EVERY DAY  90 tablet  3  . metoCLOPramide (REGLAN) 5 MG tablet Take 1 tablet (5 mg total) by mouth 2 (two) times daily.  60 tablet  11  . temazepam (RESTORIL) 15 MG capsule TAKE ONE CAPSULE BY MOUTH AT BEDTIME  30 capsule  5  . traZODone (DESYREL) 50 MG tablet           Review of Systems System review is negative for any constitutional, cardiac, pulmonary, GI or neuro symptoms or complaints other than as described in the HPI.     Objective:   Physical Exam Filed Vitals:   02/24/12 1717  BP: 114/56  Pulse: 77  Temp: 98.5 F (36.9 C)  Resp: 14   Gen'l- elderly woman in a w/c in no acute distress HEENT - C&S clear Cor - 2+ radial pulse Pulm - normal respirations. Neuro - awake, alert.       Assessment & Plan:

## 2012-02-24 NOTE — Telephone Encounter (Signed)
Caller: Greg/Child; PCP: Illene Regulus; CB#: [phone]; ; ; Call regarding Weakness, Elevated BS;   Son states patient has hx of Diabetes. States Lantus insulin was increased approx. 1 week ago from 10 untis to 15 units. States BS 320 02/24/12 @ 0900. Son states he spoke with Dr. Debby Bud 02/23/12 and was advised to have patient seen in office 02/24/12 if patient felt poorly. States patient c/o having "no energy." Afebrile. Urinating normally for patient. States patient c/o nausea. No vomiting. Triage per Diabetes: Control Problems Protocol. Care advice given per guidelines. Advised increased fluids, change positions slowly with assistance.  Call back parameters reviewed. Son verbalizes understanding. No appts. available in Epic within 1 hour.  PATIENT TRIAGED FOR ELEVATED BS/WEAKNESS. ED DISPOSITION OBTAINED. PLEASE RETURN CALL TO SON/GREG @ [phone] FOR RECOMMENDATION REGARDING OFFICE VISIT/ED. Message sent with [facility] Priority via Epic EHR to CAN Pool. Son advised to take patient to ED/Call 55 if sx increase. Son verbalizes understanding.

## 2012-02-24 NOTE — Patient Instructions (Addendum)
Diabetes - the guiding principle is to not have a blood sugar too low!! A blood sugar up to 400-500 is not dangerous, although not desirable. The Metformin 500 mg should be twice a day.  Plan - a three day titration schedule for lantus: check a fasting blood sugar - if it is 150+ three days in a row increase the lantus by 3 units, e.g go to 18 units tonight. The next step, if blood sugars are greater than 150 is 21 units. Continue this until we get control or you get to 35 units of lantus.  Blood pressure and heart rate: a heart rate less than 40 is not good. Today it is 66.   Plan - reduce lopressor to 25 mg (1/2 tablet) twice a day. We will need a periodic blood pressure and pulse check.   If the heart rate continues to have real lows will need to think about a cardiology consult.  If the blood pressure starts to go up we can make other changes in your medications.  Anemia - last Hemoglobin was 9.6 g  Which is a good reading for you.   Sleep - it is better to take the trazodone and not take the temazepam. Since you have a rough time in the morning it is reasonable to try NOT taking any sleep medication.

## 2012-02-25 ENCOUNTER — Other Ambulatory Visit: Payer: Self-pay | Admitting: Internal Medicine

## 2012-02-28 NOTE — Assessment & Plan Note (Signed)
Chronic anemia - last Hgb was stable. She follows with hematology.

## 2012-02-28 NOTE — Assessment & Plan Note (Signed)
Stable with no signs of decompensation or fluid overload on exam.  Plan Continue present medical regimen.

## 2012-02-28 NOTE — Assessment & Plan Note (Signed)
Last A1C @ 6.9% in June reveals good control. Reemphasized to her and her son that elevated serum glucose carried much less risk than overly tight control with low blood sugars. Also, for her weight her basal insulin dose may be titrated upward.  Plan Continue metformin at present dose  3 day titration cycle for adjusting basal insulin up to a limit of 35 units.

## 2012-02-28 NOTE — Assessment & Plan Note (Signed)
Reviewed the dangers of benzodiazepines, e.g. Temazepam, in the elderly including confusion, somnolence and increased risk of falls.  She is instructed to stop temazepam and to take only trazodone for sleep.

## 2012-02-28 NOTE — Assessment & Plan Note (Signed)
BP Readings from Last 3 Encounters:  02/24/12 114/56  01/20/12 132/58  12/30/11 136/60   Good control on present medical regimen.

## 2012-03-01 ENCOUNTER — Telehealth: Payer: Self-pay | Admitting: *Deleted

## 2012-03-01 NOTE — Telephone Encounter (Signed)
[name] Geophysicist/field seismologist for  [employer] called to inform of discharging patient from social service. Patient has decided to stay in her home. She will be setting her up with a personal care agency to go help with insulin medications. States her son is also considering [facility] for his mom.

## 2012-03-02 ENCOUNTER — Telehealth: Payer: Self-pay | Admitting: *Deleted

## 2012-03-02 ENCOUNTER — Encounter: Payer: Self-pay | Admitting: Internal Medicine

## 2012-03-02 NOTE — Telephone Encounter (Signed)
Physical Therapist Erskine Squibb called to report patinet BP 98/44. Dr. Debby Bud aware of this. staes this is ok for her

## 2012-03-08 ENCOUNTER — Telehealth: Payer: Self-pay

## 2012-03-08 NOTE — Telephone Encounter (Signed)
[facility] called requesting verbal order to extend services. Verbal ok?

## 2012-03-08 NOTE — Telephone Encounter (Signed)
ok 

## 2012-03-08 NOTE — Telephone Encounter (Signed)
Martha informed

## 2012-03-09 ENCOUNTER — Telehealth: Payer: Self-pay

## 2012-03-09 NOTE — Telephone Encounter (Signed)
1. Diabetes - the plan has been to allow higher CBGs to avoid hypoglycemia with tight control. No change in her regimen.  2. Blood pressure - this is a little soft.  Plan - reduce avapro to 150 mg daily - may split current pill

## 2012-03-09 NOTE — Telephone Encounter (Signed)
Left message on nurse # of orders per Dr. Debby Bud.

## 2012-03-09 NOTE — Telephone Encounter (Signed)
Johnny Bridge called to report pt's fasting morning CBG - ranging 120-140. Johnny Bridge checked her CBG at 4:30 PM yesterday and it was 328. Pt's BP has also been low at 100/48. Johnny Bridge is requesting advisement from MD.

## 2012-03-10 ENCOUNTER — Ambulatory Visit: Payer: Medicare Other | Admitting: Cardiovascular Disease

## 2012-03-15 ENCOUNTER — Telehealth: Payer: Self-pay | Admitting: Internal Medicine

## 2012-03-15 NOTE — Telephone Encounter (Signed)
Noted D/c metoprolol ROV w/Dr Norins  Thx

## 2012-03-15 NOTE — Telephone Encounter (Signed)
Caller: Greg/Child; PCP: Illene Regulus; CB#: [phone]; ; ; Call regarding Blood Pressure 110/48; states the home care nurse notified Dr. Debby Bud a week ago of a similar blood pressure, and he instructed her to cut her avapro in half.  Recently cut her metoprolol in half as well due to decreased blood pressures.  After reducing her avapro, her blood pressure remains 110/48.  No complaints of dizziness or faintness; did complain of headache as recently as 03/14/12 which resolved after tylenol 500 milligrams, 2 tabs.  Denies headache 03/15/12.  No new symptoms.  Per protocol, emergent symptoms denied; note to office for provider review per protocol.   MAY REACH FAMILY AT [phone].

## 2012-03-15 NOTE — Telephone Encounter (Signed)
Please advise in Dr Debby Bud' absence

## 2012-03-16 NOTE — Telephone Encounter (Signed)
Pt's son advised and transferred to schedule F/U as advised.

## 2012-03-23 ENCOUNTER — Encounter: Payer: Self-pay | Admitting: Internal Medicine

## 2012-03-23 ENCOUNTER — Other Ambulatory Visit (INDEPENDENT_AMBULATORY_CARE_PROVIDER_SITE_OTHER): Payer: Medicare Other

## 2012-03-23 ENCOUNTER — Ambulatory Visit (INDEPENDENT_AMBULATORY_CARE_PROVIDER_SITE_OTHER): Payer: Medicare Other | Admitting: Internal Medicine

## 2012-03-23 ENCOUNTER — Other Ambulatory Visit: Payer: Self-pay | Admitting: *Deleted

## 2012-03-23 VITALS — BP 124/60 | HR 93 | Temp 98.1°F | Resp 14 | Wt 124.0 lb

## 2012-03-23 DIAGNOSIS — E119 Type 2 diabetes mellitus without complications: Secondary | ICD-10-CM

## 2012-03-23 DIAGNOSIS — I2510 Atherosclerotic heart disease of native coronary artery without angina pectoris: Secondary | ICD-10-CM

## 2012-03-23 DIAGNOSIS — I4891 Unspecified atrial fibrillation: Secondary | ICD-10-CM

## 2012-03-23 DIAGNOSIS — D649 Anemia, unspecified: Secondary | ICD-10-CM

## 2012-03-23 DIAGNOSIS — I10 Essential (primary) hypertension: Secondary | ICD-10-CM

## 2012-03-23 DIAGNOSIS — E785 Hyperlipidemia, unspecified: Secondary | ICD-10-CM

## 2012-03-23 DIAGNOSIS — I509 Heart failure, unspecified: Secondary | ICD-10-CM

## 2012-03-23 LAB — CBC WITH DIFFERENTIAL/PLATELET
Basophils Relative: 0.3 % (ref 0.0–3.0)
Eosinophils Absolute: 0.1 10*3/uL (ref 0.0–0.7)
Eosinophils Relative: 2.1 % (ref 0.0–5.0)
HCT: 23.4 % — CL (ref 36.0–46.0)
Lymphs Abs: 0.8 10*3/uL (ref 0.7–4.0)
MCHC: 31.7 g/dL (ref 30.0–36.0)
MCV: 96.7 fl (ref 78.0–100.0)
Monocytes Absolute: 0.6 10*3/uL (ref 0.1–1.0)
Neutrophils Relative %: 76.1 % (ref 43.0–77.0)
Platelets: 196 10*3/uL (ref 150.0–400.0)
RBC: 2.42 Mil/uL — ABNORMAL LOW (ref 3.87–5.11)

## 2012-03-23 MED ORDER — IRBESARTAN 300 MG PO TABS: 150.0000 mg | ORAL_TABLET | Freq: Every day | ORAL | Status: DC

## 2012-03-23 MED ORDER — METOPROLOL TARTRATE 50 MG PO TABS: 25.0000 mg | ORAL_TABLET | Freq: Two times a day (BID) | ORAL | Status: DC

## 2012-03-23 MED ORDER — INSULIN GLARGINE 100 UNIT/ML ~~LOC~~ SOLN: 24.0000 [IU] | Freq: Every day | SUBCUTANEOUS | Status: DC

## 2012-03-23 NOTE — Progress Notes (Signed)
Subjective:    Patient ID: [name], female    DOB: [birth year], 78 y.o.   MRN: [id number]  HPI [name] presents for follow-up of Diabetes, blood pressure management and general care. She is feeling bad: no energy and many complaints. She has lost a crown and will need to have a tooth pulled.   She is having trouble sleeping and is back on the temazepam every night for sleep.  Her CBGs on lantus 24 units is variable at AM reading between 126 and 200. She has not had any low blood sugar events. Discussed the dangers of tight control and that she is doing well on her current regimen.   Blood pressure on reduced doses of medication is fine - 124/60.   No evidence of GI bleeding. Continues to see Dr. Arbutus Ped. She missed her last epo injection. Asymptomatic: no chest pain, no increased SOB  Cardiac - no chest pain. NO reports of too rapid a heart rate.   Past Medical History  Diagnosis Date  . Inguinal hernia   . Mitral valve insufficiency and aortic valve insufficiency   . Other and unspecified hyperlipidemia   . Pneumonia   . Osteoarthrosis, unspecified whether generalized or localized, unspecified site   . Nephrolithiasis   . Unspecified essential hypertension   . Type II or unspecified type diabetes mellitus without mention of complication, not stated as uncontrolled   . Coronary atherosclerosis of unspecified type of vessel, native or graft   . Congestive heart failure, unspecified   . Atrial fibrillation   . Unspecified asthma   . Anemia, unspecified   . Allergic rhinitis, cause unspecified   . Colon polyps 10/2009   Past Surgical History  Procedure Date  . Breast cyst aspiration     x50  . Abdominal hysterectomy 1990  . Lumbar fusion 2007    Jenkins  . Total hip arthroplasty 01/2007    Right - Dr Valentina Gu  . Inguinal hernia repair 2010    Rosenbower   Family History  Problem Relation Age of Onset  . Heart disease Father     CHF  . Lymphoma Sister   . Colon  cancer Neg Hx    History   Social History  . Marital Status: Widowed    Spouse Name: N/A    Number of Children: N/A  . Years of Education: N/A   Occupational History  . Not on file.   Social History Main Topics  . Smoking status: Never Smoker   . Smokeless tobacco: Never Used  . Alcohol Use: No  . Drug Use: No  . Sexually Active: Not on file   Other Topics Concern  . Not on file   Social History Narrative   Married '42 - 38 years - Widowed1 son - '50, 1 dtr - '464 grandchildrenRegular Exercise -  NOLives alone - I ADLs, including drivingEnd of life issues: no prolonged mechanical vent. Suport. Laymen's guide to death with dignity provided with a suggestion that she discuss these issues with her family. Her son was supportive of having this discussion.    Current Outpatient Prescriptions on File Prior to Visit  Medication Sig Dispense Refill  . Cholecalciferol (VITAMIN D3) 1000 UNITS CAPS Take by mouth daily.      . clonazePAM (KLONOPIN) 0.5 MG tablet TAKE ONE TABLET BY MOUTH TWICE DAILY  60 tablet  1  . doxazosin (CARDURA) 4 MG tablet TAKE ONE TABLET BY MOUTH AT BEDTIME FOR BLOOD PRESSURE.  30 tablet  5  . Fe Fum-FePoly-FA-Vit C-Vit B3 (INTEGRA F) 125-1 MG CAPS Take 1 capsule by mouth at bedtime.       . FeFum-FePoly-FA-B Cmp-C-Biot (INTEGRA PLUS) CAPS Take 1 capsule by mouth daily.       . ferrous sulfate 325 (65 FE) MG tablet Take 1 tablet (325 mg total) by mouth 2 (two) times daily with a meal.  60 tablet  11  . fexofenadine (ALLEGRA) 180 MG tablet Take 180 mg by mouth daily.       Marland Kitchen glucose blood test strip Test 2 (two) times daily. DX 250.00  100 each  11  . irbesartan (AVAPRO) 300 MG tablet Take 0.5 tablets (150 mg total) by mouth daily.  90 tablet  3  . LANOXIN 0.125 MG tablet TAKE ONE TABLET BY MOUTH EVERY DAY  30 each  5  . meclizine (ANTIVERT) 25 MG tablet Take 25 mg by mouth 3 (three) times daily as needed. For dizziness      . metFORMIN (GLUCOPHAGE) 500 MG tablet  Take 1 tablet (500 mg total) by mouth 2 (two) times daily.  180 tablet  3  . metolazone (ZAROXOLYN) 2.5 MG tablet TAKE ONE TABLET BY MOUTH EVERY MORNING ON MONDAY, WEDNESDAY, AND FRIDAY 30 MINUTES BEFORE TAKING FUROSEMIDE.  30 tablet  6  . potassium chloride SA (KLOR-CON M20) 20 MEQ tablet Take 2 tablets (40 mEq total) by mouth 2 (two) times daily.  1 tablet  0  . promethazine (PHENERGAN) 12.5 MG tablet TAKE ONE TABLET BY MOUTH EVERY 6 HOURS AS NEEDED FOR NAUSEA  30 tablet  1  . furosemide (LASIX) 40 MG tablet 1 1/2 TAB TWICE DAILY  90 tablet  11  . insulin glargine (LANTUS SOLOSTAR) 100 UNIT/ML injection Inject 24 Units into the skin at bedtime.  3 pen  11  . metoCLOPramide (REGLAN) 5 MG tablet Take 1 tablet (5 mg total) by mouth 2 (two) times daily.  60 tablet  11      Review of Systems System review is negative for any constitutional, cardiac, pulmonary, GI or neuro symptoms or complaints other than as described in the HPI.     Objective:   Physical Exam Filed Vitals:   03/23/12 1544  BP: 124/60  Pulse: 93  Temp: 98.1 F (36.7 C)  Resp: 14   Wt Readings from Last 3 Encounters:  03/23/12 124 lb (56.246 kg)  01/20/12 130 lb (58.968 kg)  12/08/11 117 lb 11.6 oz (53.4 kg)   Gen'l- Elderly white woman in no acute distress HEENT- C&S clear Cor- IRIR rate controlled Pulm - no increased WOB, no rales, no wheeze Neuro - A&O x 3, quiet, oriented.       Assessment & Plan:

## 2012-03-23 NOTE — Patient Instructions (Addendum)
Diabetes - sound like you are running a little sweet but this is better than running the risk of hypoglycemia (low readings).  Plan - continue present regimen: lantus 24 units and metfromin twice a day  Will check the A1C today - gives a 90 day look at average blood sugar levels  Blood pressure  - certainly looks a lot better on avapro 150 and no metoprolol. Continue this regimen  Anemia -You look OK Plan Blood count today  Reviewed all medications and we should all be on the same page.   I will send a letter with the lab results but call if there is a significant abnormality.

## 2012-03-24 ENCOUNTER — Telehealth: Payer: Self-pay | Admitting: *Deleted

## 2012-03-24 ENCOUNTER — Other Ambulatory Visit: Payer: Self-pay

## 2012-03-24 MED ORDER — INSULIN GLARGINE 100 UNIT/ML ~~LOC~~ SOLN: 24.0000 [IU] | Freq: Every day | SUBCUTANEOUS | Status: DC

## 2012-03-24 NOTE — Telephone Encounter (Signed)
Nurse, Frederich Balding , stated Son  Says he feels like  His mom does not need to continue with Home Health Nursing.. Please advise Home Health on what to do. Cb#[phone]

## 2012-03-25 ENCOUNTER — Other Ambulatory Visit: Payer: Self-pay | Admitting: *Deleted

## 2012-03-25 ENCOUNTER — Telehealth: Payer: Self-pay | Admitting: Internal Medicine

## 2012-03-25 DIAGNOSIS — D649 Anemia, unspecified: Secondary | ICD-10-CM

## 2012-03-25 MED ORDER — FUROSEMIDE 40 MG PO TABS: ORAL_TABLET | ORAL | Status: DC

## 2012-03-25 NOTE — Telephone Encounter (Signed)
Ok to d/c home health

## 2012-03-25 NOTE — Telephone Encounter (Signed)
called greg with appt for 8/19  aom

## 2012-03-25 NOTE — Telephone Encounter (Signed)
Called son Tammy Sours - reported low Hgb = 7.4g. Had just talked with Mrs. Gary Fleet, daughter, who was with [facility] - she is not having SOB or c/p.  Plan: 1. Call for follow-up appt with Dr. Arbutus Ped - re: long term plan to manage her chronic anemia 2. For progressive symptoms can admit for transfusion.

## 2012-03-25 NOTE — Telephone Encounter (Signed)
LEFT MESSAGE ON HOME HEALTH NURSE VM, MARTHA CARTER, OF ORDER PER DR. Debby Bud OK TO D/C HOME HEALTH CARE FOR PATIENT

## 2012-03-25 NOTE — Progress Notes (Signed)
Pt's son Tammy Sours called stating pt needs to be seen by Dr Donnald Garre.  Per Dr Debby Bud note from 8/16, pt needs to f/u with Dr Donnald Garre regarding her anemia.  Onc tx schedule made for lab and f/u visit with [facility].  SLJ

## 2012-03-27 NOTE — Assessment & Plan Note (Signed)
No complaint of chest pain. She does follow on a regular basis with cardiology.

## 2012-03-27 NOTE — Assessment & Plan Note (Signed)
Lab Results  Component Value Date   HGBA1C 5.6 03/23/2012  last creatinine  0.8 with Calc GFR 69  On metformin and lantus. CBG's running a bit high but control is good and A1C is excellent.  Plan - continue present regimen.

## 2012-03-27 NOTE — Assessment & Plan Note (Signed)
BP Readings from Last 3 Encounters:  03/23/12 124/60  02/24/12 114/56  01/20/12 132/58   Good control of BP on present regimen.  Plan Continue present treatment

## 2012-03-27 NOTE — Assessment & Plan Note (Signed)
Well compensated at exam and asymptomatic.

## 2012-03-27 NOTE — Assessment & Plan Note (Signed)
Rate controlled 

## 2012-03-27 NOTE — Assessment & Plan Note (Signed)
Patient c/o fatigue but no c/p, no increased WOB. No signs of bleeding.  Plan F/u H/H  Addendum: Hgb 7.4g. Discussed with son - she is asymptomatic, will not transfuse at this time. He is to make appointment with Dr. Arbutus Ped.

## 2012-03-28 ENCOUNTER — Ambulatory Visit (HOSPITAL_BASED_OUTPATIENT_CLINIC_OR_DEPARTMENT_OTHER): Payer: Medicare Other | Admitting: Internal Medicine

## 2012-03-28 ENCOUNTER — Encounter (HOSPITAL_COMMUNITY)
Admission: RE | Admit: 2012-03-28 | Discharge: 2012-03-28 | Disposition: A | Discharge status: Home / Self Care | Payer: Medicare Other | Source: Ambulatory Visit | Attending: Internal Medicine | Admitting: Internal Medicine

## 2012-03-28 ENCOUNTER — Encounter (HOSPITAL_COMMUNITY): Payer: Self-pay

## 2012-03-28 ENCOUNTER — Other Ambulatory Visit (HOSPITAL_BASED_OUTPATIENT_CLINIC_OR_DEPARTMENT_OTHER): Payer: Medicare Other | Admitting: Lab

## 2012-03-28 ENCOUNTER — Inpatient Hospital Stay (HOSPITAL_COMMUNITY)
Admission: AD | Admit: 2012-03-28 | Discharge: 2012-03-31 | DRG: 812 | Disposition: A | Discharge status: Home Health | Payer: Medicare Other | Source: Ambulatory Visit | Attending: Internal Medicine | Admitting: Internal Medicine

## 2012-03-28 ENCOUNTER — Telehealth: Payer: Self-pay | Admitting: Internal Medicine

## 2012-03-28 ENCOUNTER — Other Ambulatory Visit: Payer: Self-pay | Admitting: Medical Oncology

## 2012-03-28 VITALS — BP 128/51 | HR 91 | Temp 98.7°F | Resp 18 | Ht 64.0 in | Wt 125.2 lb

## 2012-03-28 DIAGNOSIS — Z794 Long term (current) use of insulin: Secondary | ICD-10-CM

## 2012-03-28 DIAGNOSIS — I059 Rheumatic mitral valve disease, unspecified: Secondary | ICD-10-CM | POA: Diagnosis present

## 2012-03-28 DIAGNOSIS — D638 Anemia in other chronic diseases classified elsewhere: Secondary | ICD-10-CM

## 2012-03-28 DIAGNOSIS — I509 Heart failure, unspecified: Secondary | ICD-10-CM | POA: Diagnosis present

## 2012-03-28 DIAGNOSIS — D539 Nutritional anemia, unspecified: Secondary | ICD-10-CM

## 2012-03-28 DIAGNOSIS — Z860100 Personal history of colon polyps, unspecified: Secondary | ICD-10-CM

## 2012-03-28 DIAGNOSIS — I080 Rheumatic disorders of both mitral and aortic valves: Secondary | ICD-10-CM | POA: Diagnosis present

## 2012-03-28 DIAGNOSIS — D509 Iron deficiency anemia, unspecified: Secondary | ICD-10-CM

## 2012-03-28 DIAGNOSIS — D649 Anemia, unspecified: Secondary | ICD-10-CM

## 2012-03-28 DIAGNOSIS — Z79899 Other long term (current) drug therapy: Secondary | ICD-10-CM

## 2012-03-28 DIAGNOSIS — I2510 Atherosclerotic heart disease of native coronary artery without angina pectoris: Secondary | ICD-10-CM

## 2012-03-28 DIAGNOSIS — I2789 Other specified pulmonary heart diseases: Secondary | ICD-10-CM | POA: Diagnosis present

## 2012-03-28 DIAGNOSIS — J45909 Unspecified asthma, uncomplicated: Secondary | ICD-10-CM | POA: Diagnosis present

## 2012-03-28 DIAGNOSIS — I4891 Unspecified atrial fibrillation: Secondary | ICD-10-CM

## 2012-03-28 DIAGNOSIS — E119 Type 2 diabetes mellitus without complications: Secondary | ICD-10-CM

## 2012-03-28 DIAGNOSIS — R110 Nausea: Secondary | ICD-10-CM

## 2012-03-28 DIAGNOSIS — I10 Essential (primary) hypertension: Secondary | ICD-10-CM

## 2012-03-28 DIAGNOSIS — E785 Hyperlipidemia, unspecified: Secondary | ICD-10-CM | POA: Diagnosis present

## 2012-03-28 DIAGNOSIS — I5032 Chronic diastolic (congestive) heart failure: Secondary | ICD-10-CM

## 2012-03-28 LAB — CBC WITH DIFFERENTIAL/PLATELET
Basophils Absolute: 0 10*3/uL (ref 0.0–0.1)
Eosinophils Absolute: 0.1 10*3/uL (ref 0.0–0.5)
HGB: 6.4 g/dL — CL (ref 11.6–15.9)
NEUT#: 4.4 10*3/uL (ref 1.5–6.5)
RDW: 17.3 % — ABNORMAL HIGH (ref 11.2–14.5)
WBC: 5.8 10*3/uL (ref 3.9–10.3)
lymph#: 0.7 10*3/uL — ABNORMAL LOW (ref 0.9–3.3)

## 2012-03-28 LAB — GLUCOSE, CAPILLARY: Glucose-Capillary: 273 mg/dL — ABNORMAL HIGH (ref 70–99)

## 2012-03-28 LAB — HOLD TUBE, BLOOD BANK

## 2012-03-28 LAB — BASIC METABOLIC PANEL
BUN: 67 mg/dL — ABNORMAL HIGH (ref 6–23)
GFR calc Af Amer: 83 mL/min — ABNORMAL LOW (ref >90)
GFR calc non Af Amer: 72 mL/min — ABNORMAL LOW (ref >90)
Potassium: 3.7 mEq/L (ref 3.5–5.1)
Sodium: 135 mEq/L (ref 135–145)

## 2012-03-28 LAB — PROTIME-INR
INR: 1.03 (ref 0.00–1.49)
Prothrombin Time: 13.7 seconds (ref 11.6–15.2)

## 2012-03-28 LAB — IRON AND TIBC
TIBC: 465 ug/dL (ref 250–470)
UIBC: 431 ug/dL — ABNORMAL HIGH (ref 125–400)

## 2012-03-28 LAB — FERRITIN: Ferritin: 29 ng/mL (ref 10–291)

## 2012-03-28 MED ORDER — POTASSIUM CHLORIDE CRYS ER 20 MEQ PO TBCR
20.0000 meq | EXTENDED_RELEASE_TABLET | Freq: Three times a day (TID) | ORAL | Status: DC
  Administered 2012-03-28 – 2012-03-31 (×8): 20 meq via ORAL
  Filled 2012-03-28 (×10): qty 1

## 2012-03-28 MED ORDER — CLONAZEPAM 0.5 MG PO TABS: 0.5000 mg | ORAL_TABLET | Freq: Two times a day (BID) | ORAL | Status: DC

## 2012-03-28 MED ORDER — DOXAZOSIN MESYLATE 4 MG PO TABS
4.0000 mg | ORAL_TABLET | Freq: Every day | ORAL | Status: DC
  Administered 2012-03-28 – 2012-03-30 (×3): 4 mg via ORAL
  Filled 2012-03-28 (×4): qty 1

## 2012-03-28 MED ORDER — INSULIN GLARGINE 100 UNIT/ML ~~LOC~~ SOLN
24.0000 [IU] | Freq: Every day | SUBCUTANEOUS | Status: DC
  Administered 2012-03-28 – 2012-03-30 (×3): 24 [IU] via SUBCUTANEOUS

## 2012-03-28 MED ORDER — POTASSIUM CHLORIDE CRYS ER 20 MEQ PO TBCR
40.0000 meq | EXTENDED_RELEASE_TABLET | Freq: Every day | ORAL | Status: DC
  Filled 2012-03-28: qty 2

## 2012-03-28 MED ORDER — METOLAZONE 2.5 MG PO TABS: 2.5000 mg | ORAL_TABLET | Freq: Every day | ORAL | Status: DC

## 2012-03-28 MED ORDER — ONDANSETRON HCL 4 MG/2ML IJ SOLN
4.0000 mg | Freq: Four times a day (QID) | INTRAMUSCULAR | Status: DC | PRN
  Administered 2012-03-29: 4 mg via INTRAVENOUS
  Filled 2012-03-28: qty 2

## 2012-03-28 MED ORDER — ALBUTEROL SULFATE (5 MG/ML) 0.5% IN NEBU: 2.5000 mg | INHALATION_SOLUTION | RESPIRATORY_TRACT | Status: DC | PRN

## 2012-03-28 MED ORDER — CLONAZEPAM 0.5 MG PO TABS
0.5000 mg | ORAL_TABLET | Freq: Two times a day (BID) | ORAL | Status: DC | PRN
  Administered 2012-03-28 – 2012-03-31 (×3): 0.5 mg via ORAL
  Filled 2012-03-28 (×3): qty 1

## 2012-03-28 MED ORDER — FUROSEMIDE 40 MG PO TABS
40.0000 mg | ORAL_TABLET | Freq: Two times a day (BID) | ORAL | Status: DC
  Administered 2012-03-29 – 2012-03-31 (×5): 40 mg via ORAL
  Filled 2012-03-28 (×7): qty 1

## 2012-03-28 MED ORDER — GUAIFENESIN-DM 100-10 MG/5ML PO SYRP: 5.0000 mL | ORAL_SOLUTION | ORAL | Status: DC | PRN

## 2012-03-28 MED ORDER — HYDROCODONE-ACETAMINOPHEN 5-325 MG PO TABS
1.0000 | ORAL_TABLET | Freq: Four times a day (QID) | ORAL | Status: DC | PRN
  Administered 2012-03-29 – 2012-03-31 (×3): 1 via ORAL
  Filled 2012-03-28 (×3): qty 1

## 2012-03-28 MED ORDER — FUROSEMIDE 40 MG PO TABS: 40.0000 mg | ORAL_TABLET | Freq: Every day | ORAL | Status: DC

## 2012-03-28 MED ORDER — METOPROLOL TARTRATE 50 MG PO TABS: 50.0000 mg | ORAL_TABLET | Freq: Two times a day (BID) | ORAL | Status: DC

## 2012-03-28 MED ORDER — ONDANSETRON HCL 4 MG PO TABS
4.0000 mg | ORAL_TABLET | Freq: Four times a day (QID) | ORAL | Status: DC | PRN
  Administered 2012-03-30: 4 mg via ORAL
  Filled 2012-03-28: qty 1

## 2012-03-28 MED ORDER — METOLAZONE 2.5 MG PO TABS
2.5000 mg | ORAL_TABLET | ORAL | Status: DC
  Administered 2012-03-30: 2.5 mg via ORAL
  Filled 2012-03-28 (×3): qty 1

## 2012-03-28 MED ORDER — DIGOXIN 125 MCG PO TABS
0.1250 mg | ORAL_TABLET | Freq: Every day | ORAL | Status: DC
  Administered 2012-03-29 – 2012-03-31 (×3): 0.125 mg via ORAL
  Filled 2012-03-28 (×3): qty 1

## 2012-03-28 MED ORDER — FUROSEMIDE 10 MG/ML IJ SOLN
10.0000 mg | INTRAMUSCULAR | Status: AC
  Administered 2012-03-29 (×2): 10 mg via INTRAVENOUS
  Filled 2012-03-28 (×2): qty 1

## 2012-03-28 MED ORDER — DIPHENHYDRAMINE HCL 12.5 MG/5ML PO ELIX
12.5000 mg | ORAL_SOLUTION | Freq: Every evening | ORAL | Status: DC | PRN
  Administered 2012-03-28 – 2012-03-29 (×2): 12.5 mg via ORAL
  Filled 2012-03-28 (×2): qty 5

## 2012-03-28 MED ORDER — INSULIN ASPART 100 UNIT/ML ~~LOC~~ SOLN
0.0000 [IU] | Freq: Three times a day (TID) | SUBCUTANEOUS | Status: DC
  Administered 2012-03-28: 5 [IU] via SUBCUTANEOUS
  Administered 2012-03-28: 9 [IU] via SUBCUTANEOUS
  Administered 2012-03-29: 2 [IU] via SUBCUTANEOUS
  Administered 2012-03-29: 3 [IU] via SUBCUTANEOUS
  Administered 2012-03-29: 5 [IU] via SUBCUTANEOUS
  Administered 2012-03-30: 1 [IU] via SUBCUTANEOUS
  Administered 2012-03-30 (×2): 3 [IU] via SUBCUTANEOUS
  Administered 2012-03-31: 1 [IU] via SUBCUTANEOUS
  Administered 2012-03-31: 3 [IU] via SUBCUTANEOUS

## 2012-03-28 NOTE — Progress Notes (Signed)
[facility] [phone]   [phone]  OFFICE PROGRESS NOTE  Illene Regulus, MD [address] [place] [address]  PRINCIPAL DIAGNOSIS: Anemia of chronic disease plus iron deficiency.   PRIOR THERAPY: Status post Feraheme infusion x6 doses; last dose was given on March 11, 2011.   CURRENT THERAPY: Integra Plus 1 capsule p.o. daily.  INTERVAL HISTORY: [name] 78 y.o. female returns to the clinic today for followup visit accompanied by her daughter. The patient has been complaining of increasing fatigue and weakness recently. She also has nausea. She was seen recently by Dr. Debby Bud and was found to have hemoglobin of 7.4 g/dL. He referred her back to me for evaluation and recommendation regarding her persistent anemia. The patient denied having any significant dizzy spells. She denied having any significant chest pain, shortness breath, cough or hemoptysis. She has no bleeding issues. No bruises or ecchymosis.  MEDICAL HISTORY: Past Medical History  Diagnosis Date  . Inguinal hernia   . Mitral valve insufficiency and aortic valve insufficiency   . Other and unspecified hyperlipidemia   . Pneumonia   . Osteoarthrosis, unspecified whether generalized or localized, unspecified site   . Nephrolithiasis   . Unspecified essential hypertension   . Type II or unspecified type diabetes mellitus without mention of complication, not stated as uncontrolled   . Coronary atherosclerosis of unspecified type of vessel, native or graft   . Congestive heart failure, unspecified   . Atrial fibrillation   . Unspecified asthma   . Anemia, unspecified   . Allergic rhinitis, cause unspecified   . Colon polyps 10/2009    ALLERGIES:  is allergic to codeine.  MEDICATIONS:  Current Outpatient Prescriptions  Medication Sig Dispense Refill  . Cholecalciferol (VITAMIN D3) 1000 UNITS CAPS Take by mouth daily.      Marland Kitchen doxazosin (CARDURA) 4 MG tablet TAKE  ONE TABLET BY MOUTH AT BEDTIME FOR BLOOD PRESSURE.  30 tablet  5  . Fe Fum-FePoly-FA-Vit C-Vit B3 (INTEGRA F) 125-1 MG CAPS Take 1 capsule by mouth at bedtime.       . fexofenadine (ALLEGRA) 180 MG tablet Take 180 mg by mouth daily.       Marland Kitchen glucose blood test strip Test 2 (two) times daily. DX 250.00  100 each  11  . insulin glargine (LANTUS SOLOSTAR) 100 UNIT/ML injection Inject 24 Units into the skin at bedtime.  3 pen  11  . LANOXIN 0.125 MG tablet TAKE ONE TABLET BY MOUTH EVERY DAY  30 each  5  . meclizine (ANTIVERT) 25 MG tablet Take 25 mg by mouth 3 (three) times daily as needed. For dizziness      . metFORMIN (GLUCOPHAGE) 500 MG tablet Take 1 tablet (500 mg total) by mouth 2 (two) times daily.  180 tablet  3  . metolazone (ZAROXOLYN) 2.5 MG tablet TAKE ONE TABLET BY MOUTH EVERY MORNING ON MONDAY, WEDNESDAY, AND FRIDAY 30 MINUTES BEFORE TAKING FUROSEMIDE.  30 tablet  6  . potassium chloride SA (KLOR-CON M20) 20 MEQ tablet Take 2 tablets (40 mEq total) by mouth 2 (two) times daily.  1 tablet  0  . promethazine (PHENERGAN) 12.5 MG tablet TAKE ONE TABLET BY MOUTH EVERY 6 HOURS AS NEEDED FOR NAUSEA  30 tablet  1  . clonazePAM (KLONOPIN) 0.5 MG tablet TAKE ONE TABLET BY MOUTH TWICE DAILY  60 tablet  1  . furosemide (LASIX) 40 MG tablet Take 40 mg by mouth  Twice daily. Take 1,1/2 tablet      . metoCLOPramide (REGLAN) 5 MG tablet Take 1 tablet (5 mg total) by mouth 2 (two) times daily.  60 tablet  11  . metoprolol (LOPRESSOR) 50 MG tablet Take 50 mg by mouth Daily.      Marland Kitchen RELION MINI PEN NEEDLES 31G X 6 MM MISC 1 Container.        SURGICAL HISTORY:  Past Surgical History  Procedure Date  . Breast cyst aspiration     x50  . Abdominal hysterectomy 1990  . Lumbar fusion 2007    [facility]  . Total hip arthroplasty 01/2007    Right - Dr Valentina Gu  . Inguinal hernia repair 2010    [facility]ower    REVIEW OF SYSTEMS:  A comprehensive review of systems was negative except for: Constitutional: positive  for fatigue   PHYSICAL EXAMINATION: General appearance: alert, cooperative and no distress Neck: no adenopathy Lymph nodes: Cervical, supraclavicular, and axillary nodes normal. Resp: clear to auscultation bilaterally Cardio: regular rate and rhythm, S1, S2 normal, no murmur, click, rub or gallop GI: soft, non-tender; bowel sounds normal; no masses,  no organomegaly Extremities: extremities normal, atraumatic, no cyanosis or edema  ECOG PERFORMANCE STATUS: 2 - Symptomatic, <50% confined to bed  Blood pressure 128/51, pulse 91, temperature 98.7 F (37.1 C), temperature source Oral, resp. rate 18, height 5\' 4"  (1.626 m), weight 125 lb 3.4 oz (56.795 kg).  LABORATORY DATA: Lab Results  Component Value Date   WBC 5.8 03/28/2012   HGB 6.4* 03/28/2012   HCT 21.1* 03/28/2012   MCV 97.2 03/28/2012   PLT 208 03/28/2012      Chemistry      Component Value Date/Time   NA 142 01/20/2012 1727   K 3.3* 01/20/2012 1727   CL 100 01/20/2012 1727   CO2 34* 01/20/2012 1727   BUN 44* 01/20/2012 1727   CREATININE 0.8 01/20/2012 1727      Component Value Date/Time   CALCIUM 9.7 01/20/2012 1727   ALKPHOS 153* 01/20/2012 1727   AST 27 01/20/2012 1727   ALT 21 01/20/2012 1727   BILITOT 1.6* 01/20/2012 1727       RADIOGRAPHIC STUDIES: No results found.  ASSESSMENT: This is a very pleasant 78 years old white female with anemia of chronic disease plus iron deficiency currently on oral iron treatment but continues to have persistent anemia. Repeat iron study today still pending. Her hemoglobin is down to 6.4.  PLAN: I will arrange for the patient to be admitted to [facility] for packed rbc's transfusion. I may also consider her for iron infusion in the form of Feraheme if her iron study showed persistent iron deficiency. I would also discussed with the patient treatment with erythrocyte stimulating factor for her anemia of chronic disease. I will arrange for her to come back for followup visit in 2  months for reevaluation. She was advised to call immediately if she has any concerning symptoms in the interval.  All questions were answered. The patient knows to call the clinic with any problems, questions or concerns. We can certainly see the patient much sooner if necessary.

## 2012-03-28 NOTE — Progress Notes (Signed)
Infusion room can transfuse pt on wed.

## 2012-03-28 NOTE — H&P (Addendum)
[facility]                                                                                    Patient Demographics  [name], is a 78 y.o. female  CSN: [id number]  MRN: [id number]  DOB - [birth year]  Admit Date - 03/28/2012  Outpatient Primary MD for the patient is Illene Regulus, MD   With History of -  Past Medical History  Diagnosis Date  . Inguinal hernia   . Mitral valve insufficiency and aortic valve insufficiency   . Other and unspecified hyperlipidemia   . Pneumonia   . Osteoarthrosis, unspecified whether generalized or localized, unspecified site   . Nephrolithiasis   . Unspecified essential hypertension   . Type II or unspecified type diabetes mellitus without mention of complication, not stated as uncontrolled   . Coronary atherosclerosis of unspecified type of vessel, native or graft   . Congestive heart failure, unspecified   . Atrial fibrillation   . Unspecified asthma   . Anemia, unspecified   . Allergic rhinitis, cause unspecified   . Colon polyps 10/2009      Past Surgical History  Procedure Date  . Breast cyst aspiration     x50  . Abdominal hysterectomy 1990  . Lumbar fusion 2007    [facility]  . Total hip arthroplasty 01/2007    Right - Dr Valentina Gu  . Inguinal hernia repair 2010    [facility]ower    in for   No chief complaint on file.    HPI  [name]  is a 78 y.o. female, long-standing history of iron deficiency anemia requiring transfusions every 3-4 months, has had extensive GI workup which has been inconclusive, history of type 2 diabetes mellitus now insulin-dependent, mitral valve insufficiency, CAD, chronic diastolic CHF compensated this admission, asthma, atrial fibrillation and questionable early dementia who has been feeling weak all over for the last few days and says it has been gradually progressive, denies any fever chills denies any chest pain palpitations, denies any blood in stool, she does have dark stools at  baseline but no change in that pattern, as any abdominal pain, she recently went to her PCP office where her hemoglobin was 7.4, at that time she had minimal symptoms, however today she felt more weak and was seeing her hematologist Dr. Arbutus Ped where she was found to have a hemoglobin of about 6.4. Her mom and then call me to admit the patient for elective packed RBC transfusion.   Dr. Arbutus Ped has already ordered anemia panel on the patient, besides generalized weakness patient is relatively symptom-free.    Review of Systems    In addition to the HPI above,  No Fever-chills, No Headache, No changes with Vision or hearing, No problems swallowing food or Liquids, No Chest pain, Cough or Shortness of Breath, No Abdominal pain, No Nausea or Vommitting, Bowel movements are regular, No Blood in stool or Urine, No dysuria, No new skin rashes or bruises, No new joints pains-aches,  No new weakness, tingling, numbness in any extremity, generalized weakness all over No recent weight gain or loss, No polyuria, polydypsia or polyphagia, No significant Mental Stressors.  A full 10 point Review of Systems was done, except as stated above, all other Review of Systems were negative.   Social History History  Substance Use Topics  . Smoking status: Never Smoker   . Smokeless tobacco: Never Used  . Alcohol Use: No      Family History Family History  Problem Relation Age of Onset  . Heart disease Father     CHF  . Lymphoma Sister   . Colon cancer Neg Hx       Prior to Admission medications   Medication Sig Start Date End Date Taking? Authorizing Provider  acetaminophen (TYLENOL) 500 MG tablet Take 1,000 mg by mouth 2 (two) times daily. For hip pain   Yes Historical Provider, MD  Cholecalciferol (VITAMIN D3) 1000 UNITS CAPS Take by mouth daily.   Yes Historical Provider, MD  clonazePAM (KLONOPIN) 0.5 MG tablet Take 0.5 mg by mouth 2 (two) times daily as needed. For anxiety   Yes  Historical Provider, MD  digoxin (LANOXIN) 0.125 MG tablet Take 0.125 mg by mouth daily.   Yes Historical Provider, MD  doxazosin (CARDURA) 4 MG tablet Take 4 mg by mouth at bedtime.   Yes Historical Provider, MD  Fe Fum-FePoly-FA-Vit C-Vit B3 (INTEGRA F) 125-1 MG CAPS Take 1 capsule by mouth daily.   Yes Historical Provider, MD  fexofenadine (ALLEGRA) 180 MG tablet Take 180 mg by mouth daily.   Yes Historical Provider, MD  furosemide (LASIX) 40 MG tablet Take 40 mg by mouth 2 (two) times daily.   Yes Historical Provider, MD  glucose blood test strip Test 2 (two) times daily. DX 250.00 01/21/12  Yes Jacques Navy, MD  insulin glargine (LANTUS SOLOSTAR) 100 UNIT/ML injection Inject 24 Units into the skin at bedtime.   Yes Historical Provider, MD  irbesartan (AVAPRO) 300 MG tablet Take 150 mg by mouth daily.    Yes Historical Provider, MD  metFORMIN (GLUCOPHAGE) 500 MG tablet Take 500 mg by mouth daily with breakfast.   Yes Historical Provider, MD  metoCLOPramide (REGLAN) 5 MG tablet Take 5 mg by mouth 2 (two) times daily.   Yes Historical Provider, MD  metolazone (ZAROXOLYN) 2.5 MG tablet Take 2.5 mg by mouth See admin instructions. Every Monday Wednesday and Friday 30 minutes before Furosemide   Yes Historical Provider, MD  potassium chloride SA (K-DUR,KLOR-CON) 20 MEQ tablet Take 20 mEq by mouth 3 (three) times daily.   Yes Historical Provider, MD  RELION MINI PEN NEEDLES 31G X 6 MM MISC 1 Container. 02/25/12  Yes Historical Provider, MD  metoCLOPramide (REGLAN) 5 MG tablet Take 1 tablet (5 mg total) by mouth 2 (two) times daily. 12/17/11 12/27/11  Jacques Navy, MD    Allergies  Allergen Reactions  . Codeine     REACTION: Hives    Physical Exam  Vitals  Blood pressure 131/63, pulse 91, temperature 97.7 F (36.5 C), temperature source Oral, resp. rate 18, height 5\' 4"  (1.626 m), weight 56.7 kg (125 lb), SpO2 97.00%.   1. General frail pale looking elderly caucasian  female lying in  bed in NAD,    2. Normal affect and insight, Not Suicidal or Homicidal, Awake Alert,    3. No F.N deficits, ALL C.Nerves Intact, Strength 5/5 all 4 extremities, Sensation intact all 4 extremities, Plantars down going.  4. Ears and Eyes appear Normal, Conjunctivae clear, PERRLA. Moist Oral Mucosa.  5. Supple Neck, No JVD, No cervical lymphadenopathy appriciated, No Carotid Bruits.  6. Symmetrical  Chest wall movement, Good air movement bilaterally, CTAB.  7. i RRR, No Gallops, Rubs , soft systolic metal valve area Murmur, No Parasternal Heave.  8. Positive Bowel Sounds, Abdomen Soft, Non tender, No organomegaly appriciated,No rebound -guarding or rigidity.  9.  No Cyanosis, Normal Skin Turgor, No Skin Rash or Bruise.  10. Good muscle tone,  joints appear normal , no effusions, Normal ROM.  11. No Palpable Lymph Nodes in Neck or Axillae     Data Review  CBC  Lab 03/28/12 1051 03/23/12 1653  WBC 5.8 6.5  HGB 6.4* 7.4 cL*  HCT 21.1* 23.4 aL*  PLT 208 196.0  MCV 97.2 96.7  MCH 29.5 --  MCHC 30.3* 31.7  RDW 17.3* 20.2*  LYMPHSABS 0.7* 0.8  MONOABS 0.5 0.6  EOSABS 0.1 0.1  BASOSABS 0.0 0.0  BANDABS -- --   ------------------------------------------------------------------------------------------------------------------  Chemistries   Lab 03/28/12 1345  NA 135  K 3.7  CL 93*  CO2 29  GLUCOSE 282*  BUN 67*  CREATININE 0.78  CALCIUM 10.2  MG --  AST --  ALT --  ALKPHOS --  BILITOT --   ------------------------------------------------------------------------------------------------------------------ estimated creatinine clearance is 41.2 ml/min (by C-G formula based on Cr of 0.78). ------------------------------------------------------------------------------------------------------------------ No results found for this basename: TSH,T4TOTAL,FREET3,T3FREE,THYROIDAB in the last 72 hours   Coagulation profile  Lab 03/28/12 1345  INR 1.03  PROTIME --    ------------------------------------------------------------------------------------------------------------------- No results found for this basename: DDIMER:2 in the last 72 hours -------------------------------------------------------------------------------------------------------------------  Cardiac Enzymes No results found for this basename: CK:3,CKMB:3,TROPONINI:3,MYOGLOBIN:3 in the last 168 hours ------------------------------------------------------------------------------------------------------------------ No components found with this basename: POCBNP:3     Assessment & Plan  1. Generalized weakness due to gradually worsening iron deficiency anemia , it appears that patient gets blood transfusions every 3-4 months per her son, she has had extensive GI workup in the past which has been inconclusive, she denies any history of blood in stool, stools are slightly dark but says that's the baseline - patient will be admitted to the hospital, we will go ahead and transfuse her with 2 units of packed RBCs, in the light of her diastolic CHF she will get 10 mg of Lasix IV after each unit. Iron deficiency panel has already been ordered by Dr. Arbutus Ped. At this point no further diagnostic studies from GI standpoint will be ordered, I think considering the patient's age supportive care is most prudent.    2. History of atrial fibrillation goal will be rate control she is not a candidate for anti-coagulation due to long-standing history of iron deficiency anemia of unclear reason - continue her beta blocker and digoxin at home dose, monitor heart rate with vital signs every shift. She is symptom-free i.e. does not feel any palpitations.    3. Diabetes mellitus type 2 - no acute issues we'll continue her home dose Lantus, Glucophage will be held while she is in the hospital, q. a.c. NovoLog sliding scale insulin will be ordered.    4. Chronic diastolic CHF - EF on echo done recently in May  of this year is over 60%-she is clinically compensated this admission home dose diuretics were confirmed with her son and will be continued.    5. History of CAD and mitral valve insufficiency -no acute issues outpatient followup with her cardiologist.     6. Severe pulmonary hypertension found incidentally on echogram in May 2013- no acute issues outpatient followup with PCP consider one-time patient pulmonary followup. Oxygen when necessary if patient gets  short of breath.     DVT Prophylaxis SCDs      AM Labs Ordered, also please review Full Orders  Family Communication: Admission, patients condition and plan of care including tests being ordered have been discussed with the patient and son (POA) who indicate understanding and agree with the plan and Code Status.  Code Status Full  Disposition Plan: Home  Time spent in minutes : 30  Condition [name] K M.D on 03/28/2012 at 2:34 PM  Between 7am to 7pm - Pager - [phone]  After 7pm go to [email or web address] - password [email or web address]  And look for the night coverage person covering me after hours  [facility] Office  [phone]

## 2012-03-28 NOTE — Telephone Encounter (Signed)
greg aware of oct. appts

## 2012-03-29 ENCOUNTER — Telehealth: Payer: Self-pay | Admitting: *Deleted

## 2012-03-29 DIAGNOSIS — D509 Iron deficiency anemia, unspecified: Secondary | ICD-10-CM

## 2012-03-29 LAB — GLUCOSE, CAPILLARY
Glucose-Capillary: 164 mg/dL — ABNORMAL HIGH (ref 70–99)
Glucose-Capillary: 209 mg/dL — ABNORMAL HIGH (ref 70–99)
Glucose-Capillary: 301 mg/dL — ABNORMAL HIGH (ref 70–99)

## 2012-03-29 LAB — BASIC METABOLIC PANEL WITH GFR
BUN: 59 mg/dL — ABNORMAL HIGH (ref 6–23)
CO2: 30 meq/L (ref 19–32)
Calcium: 9.6 mg/dL (ref 8.4–10.5)
Chloride: 96 meq/L (ref 96–112)
Creatinine, Ser: 0.75 mg/dL (ref 0.50–1.10)
GFR calc Af Amer: 84 mL/min — ABNORMAL LOW
GFR calc non Af Amer: 73 mL/min — ABNORMAL LOW
Glucose, Bld: 172 mg/dL — ABNORMAL HIGH (ref 70–99)
Potassium: 3.2 meq/L — ABNORMAL LOW (ref 3.5–5.1)
Sodium: 138 meq/L (ref 135–145)

## 2012-03-29 LAB — CBC
MCV: 94.2 fL (ref 78.0–100.0)
Platelets: 209 10*3/uL (ref 150–400)
RBC: 2.6 MIL/uL — ABNORMAL LOW (ref 3.87–5.11)
WBC: 6 10*3/uL (ref 4.0–10.5)

## 2012-03-29 MED ORDER — FUROSEMIDE 10 MG/ML IJ SOLN
20.0000 mg | Freq: Once | INTRAMUSCULAR | Status: AC
  Administered 2012-03-29: 20 mg via INTRAVENOUS
  Filled 2012-03-29: qty 2

## 2012-03-29 NOTE — Progress Notes (Signed)
Subjective: [name] suffers anemia of chronic disease and iron deficiency anemia from occult GI blood loss. Her Hgb was 7.4 gr August 14. She was referred to Dr. Arbutus Ped who saw her August 19. Repeat Hgb 6.4 leading to admission for transfusion. She had 2 u PRBCs with Hgb rising to 7.8. Repeat Iron studies pending.  Objective: Lab: Lab Results  Component Value Date   WBC 6.0 03/29/2012   HGB 7.8* 03/29/2012   HCT 24.5* 03/29/2012   MCV 94.2 03/29/2012   PLT 209 03/29/2012   BMET    Component Value Date/Time   NA 138 03/29/2012 0635   K 3.2* 03/29/2012 0635   CL 96 03/29/2012 0635   CO2 30 03/29/2012 0635   GLUCOSE 172* 03/29/2012 0635   BUN 59* 03/29/2012 0635   CREATININE 0.75 03/29/2012 0635   CALCIUM 9.6 03/29/2012 0635   GFRNONAA 73* 03/29/2012 0635   GFRAA 84* 03/29/2012 0635     Imaging:  Scheduled Meds:   . digoxin  0.125 mg Oral Daily  . doxazosin  4 mg Oral QHS  . furosemide  10 mg Intravenous Q4H  . furosemide  40 mg Oral BID PC  . insulin aspart  0-9 Units Subcutaneous TID WC  . insulin glargine  24 Units Subcutaneous QHS  . metolazone  2.5 mg Oral Custom  . potassium chloride SA  20 mEq Oral TID  . DISCONTD: clonazePAM  0.5 mg Oral BID  . DISCONTD: furosemide  40 mg Oral Daily  . DISCONTD: metolazone  2.5 mg Oral Daily  . DISCONTD: metoprolol  50 mg Oral BID  . DISCONTD: potassium chloride SA  40 mEq Oral Daily   Continuous Infusions:  PRN Meds:.albuterol, clonazePAM, diphenhydrAMINE, guaiFENesin-dextromethorphan, HYDROcodone-acetaminophen, ondansetron (ZOFRAN) IV, ondansetron   Physical Exam: Filed Vitals:   03/29/12 0919  BP:   Pulse: 88  Temp:   Resp:    Gen'l- elderly white woman sitting on the side of the bed, no acute distress HEENT- C&S clear Cor - Regular tachycardia, III.VI systolic murmur best RSB Pulm - normal respirations Neuro/pscyh - A&O, no deficits.     Assessment/Plan: 1. Anemia - chronic disease and iron deficiency. Plan  2 more  units of PRBCs to get Hgb to 10g.  Iron panel.  2. DM - stable on sliding scale.   Casimiro Needle [facility] ([phone]  03/29/2012, 12:19 PM

## 2012-03-29 NOTE — Evaluation (Signed)
Physical Therapy Evaluation Patient Details Name: [name] MRN: [id number] DOB: 11/01/1919 Today's Date: 03/29/2012 Time: 4098-1191 PT Time Calculation (min): 13 min  PT Assessment / Plan / Recommendation Clinical Impression  Pt. was admitted w/ decreased hgb. Pt is weak and will benefit from PT to improve strength and functional mobility to DC to home if pt. has 24/7 assistance.    PT Assessment  Patient needs continued PT services    Follow Up Recommendations  Home health PT;Supervision/Assistance - 24 hour    Barriers to Discharge Decreased caregiver support      Equipment Recommendations  None recommended by PT    Recommendations for Other Services OT consult   Frequency Min 3X/week    Precautions / Restrictions Precautions Precautions: Fall   Pertinent Vitals/Pain       Mobility  Bed Mobility Bed Mobility: Supine to Sit;Sit to Supine Supine to Sit: 4: Min assist Sit to Supine: 4: Min guard Transfers Transfers: Sit to Stand;Stand to Sit Sit to Stand: 4: Min assist;From bed Stand to Sit: To bed;4: Min guard Ambulation/Gait Ambulation/Gait Assistance: 4: Min Environmental consultant (Feet): 50 Feet Assistive device: Rolling walker Ambulation/Gait Assistance Details: pt, was ale to ambulate a short distance, Vc for turns. Gait Pattern: Step-through pattern Gait velocity: dec reased    Exercises     PT Diagnosis: Difficulty walking  PT Problem List: Decreased strength;Decreased activity tolerance;Decreased mobility;Decreased knowledge of use of DME;Impaired sensation PT Treatment Interventions: DME instruction;Gait training;Functional mobility training;Therapeutic activities;Patient/family education   PT Goals Acute Rehab PT Goals PT Goal Formulation: With patient Time For Goal Achievement: 04/12/12 Potential to Achieve Goals: Good Pt will go Supine/Side to Sit: Independently PT Goal: Supine/Side to Sit - Progress: Goal set today Pt will go Sit  to Supine/Side: Independently PT Goal: Sit to Supine/Side - Progress: Goal set today Pt will go Sit to Stand: with supervision PT Goal: Sit to Stand - Progress: Goal set today Pt will go Stand to Sit: Independently PT Goal: Stand to Sit - Progress: Goal set today Pt will Ambulate: 51 - 150 feet;with supervision;with rolling walker PT Goal: Ambulate - Progress: Goal set today  Visit Information  Last PT Received On: 03/29/12 Assistance Needed: +1    Subjective Data  Subjective: i am so weak. Patient Stated Goal: to go home   Prior Functioning  Home Living Lives With: Son (there parttime) Available Help at Discharge: Family;Available PRN/intermittently Type of Home: House Home Access: Level entry Home Layout: Two level;Able to live on main level with bedroom/bathroom Home Adaptive Equipment: Walker - rolling Prior Function Level of Independence: Independent with assistive device(s) Driving: No    Cognition  Overall Cognitive Status: Appears within functional limits for tasks assessed/performed Arousal/Alertness: Awake/alert Orientation Level: Appears intact for tasks assessed Behavior During Session: [facility] for tasks performed    Extremity/Trunk Assessment Right Upper Extremity Assessment RUE ROM/Strength/Tone: [facility] for tasks assessed Left Upper Extremity Assessment LUE ROM/Strength/Tone: WFL for tasks assessed LUE Sensation: Deficits LUE Sensation Deficits: feet feel numb Right Lower Extremity Assessment RLE ROM/Strength/Tone: [facility] for tasks assessed Left Lower Extremity Assessment LLE ROM/Strength/Tone: WFL for tasks assessed   Balance    End of Session PT - End of Session Activity Tolerance: Patient tolerated treatment well;Patient limited by fatigue Patient left: in bed;with call bell/phone within reach Nurse Communication: Mobility status  GP     Rada Hay 03/29/2012, 5:18 PM  [phone]

## 2012-03-29 NOTE — Progress Notes (Signed)
I talked to Dr. Debby Bud nurse, Hoy Register and notified her and  to gave message that patient's service was transferred to Dr. Debby Bud  from Dr. Vanessa Scio she said she will relay message. Hulda Marin Rn.

## 2012-03-30 ENCOUNTER — Inpatient Hospital Stay (HOSPITAL_COMMUNITY): Payer: Medicare Other

## 2012-03-30 LAB — TYPE AND SCREEN
ABO/RH(D): A POS
Donor AG Type: NEGATIVE
Donor AG Type: NEGATIVE

## 2012-03-30 LAB — GLUCOSE, CAPILLARY
Glucose-Capillary: 215 mg/dL — ABNORMAL HIGH (ref 70–99)
Glucose-Capillary: 212 mg/dL — ABNORMAL HIGH (ref 70–99)

## 2012-03-30 LAB — HEMOGLOBIN AND HEMATOCRIT, BLOOD: Hemoglobin: 9.7 g/dL — ABNORMAL LOW (ref 12.0–15.0)

## 2012-03-30 MED ORDER — TEMAZEPAM 7.5 MG PO CAPS
7.5000 mg | ORAL_CAPSULE | Freq: Every evening | ORAL | Status: DC | PRN
  Administered 2012-03-30: 7.5 mg via ORAL
  Filled 2012-03-30: qty 1

## 2012-03-30 MED ORDER — SODIUM CHLORIDE 0.9 % IV SOLN
1020.0000 mg | Freq: Once | INTRAVENOUS | Status: AC
  Administered 2012-03-30: 1020 mg via INTRAVENOUS
  Filled 2012-03-30: qty 34

## 2012-03-30 NOTE — Progress Notes (Signed)
Physical Therapy Treatment Patient Details Name: [name] MRN: [id number] DOB: [birth year] Today's Date: 03/30/2012 Time: 1500-1530 PT Time Calculation (min): 30 min  PT Assessment / Plan / Recommendation Comments on Treatment Session  Pt performed exercise, ambulated in hallway and to bathroom prior to return to chair.  She did well with all mobility, but did have pain from hip arthritis.  Still recommend HHPT for hip strengthening and safety at home    Follow Up Recommendations  Home health PT    Barriers to Discharge        Equipment Recommendations  None recommended by PT    Recommendations for Other Services    Frequency     Plan Discharge plan remains appropriate    Precautions / Restrictions Precautions Precautions: Fall   Pertinent Vitals/Pain Pt with c/o pain in hip    Mobility       Exercises General Exercises - Lower Extremity Ankle Circles/Pumps: AROM;Both;10 reps;Supine Quad Sets: AROM;Both;10 reps;Supine Gluteal Sets: AROM;Both;10 reps;Standing Short Arc Quad: Both;10 reps;Supine Heel Slides: AROM;Both;Supine;10 reps Hip ABduction/ADduction: AROM;Both;10 reps;Supine   PT Diagnosis:    PT Problem List:   PT Treatment Interventions:     PT Goals Acute Rehab PT Goals PT Goal Formulation: With patient Time For Goal Achievement: 04/12/12 Potential to Achieve Goals: Good Pt will go Supine/Side to Sit: Independently PT Goal: Supine/Side to Sit - Progress: Progressing toward goal Pt will go Sit to Supine/Side: Independently Pt will go Sit to Stand: with supervision PT Goal: Sit to Stand - Progress: Progressing toward goal Pt will go Stand to Sit: Independently PT Goal: Stand to Sit - Progress: Progressing toward goal Pt will Ambulate: 51 - 150 feet;with supervision;with rolling walker PT Goal: Ambulate - Progress: Progressing toward goal  Visit Information  Last PT Received On: 03/30/12 Assistance Needed: +1    Subjective Data  Subjective: pt  c/o pain of arthritis pain in left hip Patient Stated Goal: to go home   Cognition  Overall Cognitive Status: Appears within functional limits for tasks assessed/performed Arousal/Alertness: Awake/alert Orientation Level: Appears intact for tasks assessed Behavior During Session: [facility] for tasks performed    Balance     End of Session PT - End of Session Equipment Utilized During Treatment: Gait belt Activity Tolerance: Patient tolerated treatment well Patient left: in chair;with call bell/phone within reach Nurse Communication: Mobility status   GP     Rosey Bath K. Mil[place], [place] [phone] 03/30/2012, 4:27 PM

## 2012-03-30 NOTE — Progress Notes (Signed)
Subjective: Sitting up eating lunch. She says she feels much better   Objective: Lab: Lab Results  Component Value Date   WBC 6.0 03/29/2012   HGB 9.7* 03/30/2012   HCT 30.0* 03/30/2012   MCV 94.2 03/29/2012   PLT 209 03/29/2012   BMET    Component Value Date/Time   NA 138 03/29/2012 0635   K 3.2* 03/29/2012 0635   CL 96 03/29/2012 0635   CO2 30 03/29/2012 0635   GLUCOSE 172* 03/29/2012 0635   BUN 59* 03/29/2012 0635   CREATININE 0.75 03/29/2012 0635   CALCIUM 9.6 03/29/2012 0635   GFRNONAA 73* 03/29/2012 0635   GFRAA 84* 03/29/2012 0635  Iron panel: Fe 40, % sat 7   Imaging: no new imaging  Scheduled Meds:   . digoxin  0.125 mg Oral Daily  . doxazosin  4 mg Oral QHS  . furosemide  20 mg Intravenous Once  . furosemide  40 mg Oral BID PC  . insulin aspart  0-9 Units Subcutaneous TID WC  . insulin glargine  24 Units Subcutaneous QHS  . metolazone  2.5 mg Oral Custom  . potassium chloride SA  20 mEq Oral TID   Continuous Infusions:  PRN Meds:.albuterol, clonazePAM, diphenhydrAMINE, guaiFENesin-dextromethorphan, HYDROcodone-acetaminophen, ondansetron (ZOFRAN) IV, ondansetron   Physical Exam: Filed Vitals:   03/30/12 0556  BP: 103/43  Pulse: 69  Temp: 97.9 F (36.6 C)  Resp: 16   Gen'l - good color. No distress Pulm - normal respirations Cor - regular rhythm     Assessment/Plan: 1. Anemia - good response: after 4 units PRBC Hgb 9.7 up from 6.4. Iron studies reveal persistent deficiency  Plan Feraheme infusion  H/H in AM  2. DM -  CBG (last 3)   Basename 03/30/12 1217 03/29/12 2150 03/29/12 1708  GLUCAP 215* 301* 209*    Running a bit high which is ok. As an outpatient have wanted to avoid overly aggressive care due to risk of hypoglycemia.  Plan  Continue present regimen  3. Dispo - patient has been unwilling to consider congregate living. Plan will be to return home with home health.   Casimiro Needle Norins [place] [facility] Call-grp - [facility] ([phone]  [phone]  03/30/2012, 12:46 PM

## 2012-03-30 NOTE — Telephone Encounter (Signed)
Dr. Debby Bud aware of Dr. Thedore Mins transferred services to him on patient and  H/P has been done

## 2012-03-31 ENCOUNTER — Inpatient Hospital Stay (HOSPITAL_COMMUNITY): Payer: Medicare Other

## 2012-03-31 LAB — GLUCOSE, CAPILLARY: Glucose-Capillary: 145 mg/dL — ABNORMAL HIGH (ref 70–99)

## 2012-03-31 NOTE — Progress Notes (Signed)
Subjective: [name] is complaining of pain in the area of the left scapula and chest wall. She denies any chest pain or pressure, no shortness of breath.  Objective: Lab: Lab Results  Component Value Date   WBC 6.0 03/29/2012   HGB 10.2* 03/31/2012   HCT 31.4* 03/31/2012   MCV 94.2 03/29/2012   PLT 209 03/29/2012   BMET    Component Value Date/Time   NA 138 03/29/2012 0635   K 3.2* 03/29/2012 0635   CL 96 03/29/2012 0635   CO2 30 03/29/2012 0635   GLUCOSE 172* 03/29/2012 0635   BUN 59* 03/29/2012 0635   CREATININE 0.75 03/29/2012 0635   CALCIUM 9.6 03/29/2012 0635   GFRNONAA 73* 03/29/2012 0635   GFRAA 84* 03/29/2012 0635     Imaging: no new imaging Scheduled Meds:   . digoxin  0.125 mg Oral Daily  . doxazosin  4 mg Oral QHS  . ferumoxytol  1,020 mg Intravenous Once  . furosemide  40 mg Oral BID PC  . insulin aspart  0-9 Units Subcutaneous TID WC  . insulin glargine  24 Units Subcutaneous QHS  . metolazone  2.5 mg Oral Custom  . potassium chloride SA  20 mEq Oral TID   Continuous Infusions:  PRN Meds:.albuterol, clonazePAM, diphenhydrAMINE, guaiFENesin-dextromethorphan, HYDROcodone-acetaminophen, ondansetron (ZOFRAN) IV, ondansetron, temazepam   Physical Exam: Filed Vitals:   03/31/12 0500  BP: 110/53  Pulse: 84  Temp: 98.6 F (37 C)  Resp: 14  elderly white woman in no acute distress HEENT- normal Chest - tender to palpation below the left scapula and posterior axillary line Lungs- good breath sounds, no rales Cor - RRR Abd - soft      Assessment/Plan: 1. Anemia - Hgb is good this AM. She has complete her iron infusion  Plan - outpatient monitoring  2. DM   CBG (last 3)   Basename 03/31/12 0745 03/30/12 2144 03/30/12 1703  GLUCAP 145* 212* 245*   3. Chest wall pain - will check out before discharging. Normal cxr 8/21 noted, repeat study due to new chest wall pain  Plan EKG, PA/Lat CXR  Hopefully home later today.      Casimiro Needle Norins   IM Call-grp - [facility] ([phone]; [phone]  03/31/2012, 8:16 AM

## 2012-03-31 NOTE — Progress Notes (Signed)
Patient doing well. She had normal EKG and CXR this Am.  For d/c home. Resume all home meds. Home health F to F done 8/21  Dictated # [id number]

## 2012-03-31 NOTE — Care Management Note (Signed)
    Page 1 of 1   03/31/2012     12:02:24 PM   CARE MANAGEMENT NOTE 03/31/2012  Patient:  [name], [name]   Account Number:  [id number]  Date Initiated:  03/31/2012  Documentation initiated by:  CRAFT,TERRI  Subjective/Objective Assessment:   78 yo female admitted with severe anemia     Action/Plan:   D/C when medically stable   Anticipated DC Date:  04/03/2012   Anticipated DC Plan:  HOME W HOME HEALTH SERVICES      DC Planning Services  CM consult      Choice offered to / List presented to:  C-1 Patient        HH arranged  HH-1 RN  HH-2 PT  HH-3 OT      [facility] agency  [facility]   Status of service:  Completed, signed off  Discharge Disposition:  HOME W HOME HEALTH SERVICES  Per UR Regulation:  Reviewed for med. necessity/level of care/duration of stay  Comments:  03/31/12, Kathi Der RNC-MNN, BSN, [phone], CM received referral.  CM met with pt to offer choice for [facility] services. Pt states she has used [facility] before and would like to use them again.  Darl Pikes at [facility] contacted with orders and confirmation received.

## 2012-03-31 NOTE — Progress Notes (Signed)
Physical Therapy Treatment Patient Details Name: [name] MRN: [id number] DOB: [birth year] Today's Date: 03/31/2012 Time: 4098-1191 PT Time Calculation (min): 18 min  PT Assessment / Plan / Recommendation Comments on Treatment Session  Pt with c/o left shoulder pain today, but was able to move out of bed to bathroom, walk in hallway and perform standing exercises without difficulty.  She will need 24 hour supervision and encouragement at home along with HHPT initially    Follow Up Recommendations  Home health PT    Barriers to Discharge        Equipment Recommendations  None recommended by PT    Recommendations for Other Services    Frequency Min 3X/week   Plan Discharge plan remains appropriate    Precautions / Restrictions Restrictions Weight Bearing Restrictions: No   Pertinent Vitals/Pain Pt c/o left shoulder pain today    Mobility  Bed Mobility Bed Mobility: Supine to Sit Supine to Sit: 5: Supervision Transfers Transfers: Sit to Stand;Stand to Sit Sit to Stand: From bed;5: Supervision Stand to Sit: To chair/3-in-1;5: Supervision;To toilet Details for Transfer Assistance: cues to push up with arms Ambulation/Gait Ambulation/Gait Assistance: 5: Supervision Ambulation Distance (Feet): 175 Feet Assistive device: Rolling walker Ambulation/Gait Assistance Details: encouragement to increase distance Gait Pattern: Step-through pattern;Antalgic;Decreased stance time - left Gait velocity: decreased General Gait Details: increasing in distance Stairs: No Wheelchair Mobility Wheelchair Mobility: No    Exercises Other Exercises Other Exercises: standing core activation and trunk extension Other Exercises: AROM to cervical spine and scapula within pain limit.  Pt observed to have decreased cervical ROM   PT Diagnosis:    PT Problem List:   PT Treatment Interventions:     PT Goals Acute Rehab PT Goals PT Goal Formulation: With patient Time For Goal Achievement:  04/12/12 Potential to Achieve Goals: Good Pt will go Supine/Side to Sit: Independently PT Goal: Supine/Side to Sit - Progress: Progressing toward goal Pt will go Sit to Supine/Side: Independently Pt will go Sit to Stand: with supervision PT Goal: Sit to Stand - Progress: Met Pt will go Stand to Sit: Independently PT Goal: Stand to Sit - Progress: Progressing toward goal Pt will Ambulate: 51 - 150 feet;with supervision;with rolling walker PT Goal: Ambulate - Progress: Met  Visit Information  Last PT Received On: 03/31/12 Assistance Needed: +1    Subjective Data  Subjective: "its my whole left side"  Pt c/o pain in left shoulder today Patient Stated Goal: to go home when she is ready   Cognition  Overall Cognitive Status: Appears within functional limits for tasks assessed/performed Arousal/Alertness: Awake/alert Orientation Level: Appears intact for tasks assessed Behavior During Session: [facility] for tasks performed    Balance     End of Session PT - End of Session Equipment Utilized During Treatment: Gait belt Activity Tolerance: Patient tolerated treatment well Patient left: in chair;with call bell/phone within reach Nurse Communication: Mobility status   GP [name], [place] [phone] 03/31/2012, 10:12 AM

## 2012-03-31 NOTE — Progress Notes (Addendum)
Pt D/C home. Pt is stable, on room are, vitals within normal range for patient. D/C instructions and medication administration instructions done. Pt verbalizes understanding. Pt has no new complain.

## 2012-04-01 ENCOUNTER — Other Ambulatory Visit: Payer: Self-pay | Admitting: Internal Medicine

## 2012-04-01 ENCOUNTER — Telehealth: Payer: Self-pay | Admitting: Internal Medicine

## 2012-04-01 ENCOUNTER — Telehealth: Payer: Self-pay | Admitting: *Deleted

## 2012-04-01 NOTE — Discharge Summary (Signed)
[name], [name] NO.:  [id number]  MEDICAL RECORD NO.:  [id number]  LOCATION:  1312                         FACILITY:  [facility]  PHYSICIAN:  Rosalyn Gess. Audrionna Lampton, MD  DATE OF BIRTH:  1920-07-10  DATE OF ADMISSION:  03/28/2012 DATE OF DISCHARGE:  03/31/2012                              DISCHARGE SUMMARY   ADMITTING DIAGNOSES: 1. Profound anemia from iron deficiency and chronic disease. 2. Atrial fibrillation controlled. 3. Diabetes, controlled. 4. History of chronic diastolic heart failure, well compensated. 5. History of coronary artery disease and mitral valve insufficiency. 6. Severe pulmonary hypertension.  DISCHARGE DIAGNOSES: 1. Profound anemia from iron deficiency and chronic disease. 2. Atrial fibrillation controlled. 3. Diabetes, controlled. 4. History of chronic diastolic heart failure, well compensated. 5. History of coronary artery disease and mitral valve insufficiency. 6. Severe pulmonary hypertension.  CONSULTANTS:  None.  PROCEDURES:  Chest x-ray, March 30, 2012, with no acute cardiopulmonary abnormalities and stable cardiac enlargement.  Chest x-ray, March 31, 2012, secondary to back pain and scapula which showed enlarged cardiac silhouette with pulmonary vascular congestion.  Emphysematous changes. No acute changes.  EKG on March 31, 2012, which showed no acute ischemic changes.  HISTORY OF PRESENT ILLNESS:  [name] has been followed long-term for multiple medical problems including chronic recurrent anemia.  She has been evaluated on multiple occasions, and the working diagnosis is possible AVM loss as chronic disease as well as iron deficiency associated with the above.  The patient has had multiple admissions for transfusion.  She has been seen in consultation by Dr. Arbutus Ped.  The patient was seen in the office 5 days prior to admission, where she had a hemoglobin of 7.4 g which she was asymptomatic.  She was referred back to  Dr. Arbutus Ped for followup.  Laboratory at that visit on Monday, March 28, 2012, revealed the patient to have a hemoglobin dropped to 6.4 g and she was subsequently referred by Dr. Arbutus Ped to the emergency department for admission for transfusion.  The patient's past medical history, family history, social history are well documented in both the admission note as well as prior epic notes.  HOSPITAL COURSE:  Anemia.  The patient was transfused a total of 4 units of packed red cells to a hemoglobin of 9.7 g.  Final hemoglobin on the day of discharge was 10.2 g.  The patient was significantly iron deficient with an iron saturation of 7%.  She was given an infusion of Feraheme to help place her iron and maintain her hemoglobin at a stable level.  The patient tolerated transfusions and Feraheme without difficulty.  The patient's other chronic medical problems remained stable.  On the day of discharge, she did complain of pain in the scapular area on the left with some pain as well in the posterior axillary line.  A  12-lead EKG was performed and this was unremarkable with no acute changes.  A second chest x-ray was performed, which showed no acute changes.  With the patient being adequately transfused to a hemoglobin of 10.2 g at discharge with her having had IV iron infusion with her feeling improved in regard to level of energy.  At this  time, she is ready for discharge to home.  DISCHARGE PHYSICAL EXAMINATION:  VITAL SIGNS:  Temperature was 98.6, blood pressure 110/53, pulse was 84, respirations 14, O2 sats 93% on room air. GENERAL APPEARANCE:  The patient is awake, alert, sitting in her chair having had lunch.  This is an elderly, frail-appearing woman in no acute distress. HEENT:  Conjunctiva and sclerae were clear.  Pupils equal, round, and reactive. CHEST:  Patient is moving air well with no rales, wheezes, or rhonchi. No increased work of breathing.  She is tender to palpation  at the inferior aspect of the left scapula and to the posterior axillary line. CARDIOVASCULAR:  2+ radial pulse.  She has a quiet precordium with an irregular heart rate.  She has a prominent 3/6 systolic heart murmur that is heard best at the left sternal border. BREASTS:  Deferred. ABDOMEN:  Soft.  No guarding or rebound was appreciated. NEUROLOGIC:  The patient is awake, alert, oriented to person, place, time, and context.  FINAL LABORATORY DATA:  Final hemoglobin on day of discharge is 10.2 g. Chemistries from March 29, 2012, with a sodium of 138, potassium 3.2, chloride of 96, CO2 of 30, BUN of 59, creatinine 0.75, glucose 172. Thyroid function on March 28, 2012, with a TSH at 3.9.  Hemoglobin A1c was checked and was 6.5%.  INR was normal at 1.03.  DISPOSITION:  The patient is to be discharged home.  She has been encouraged in the past to consider moving to congregate living but has been steadfast in her refusal and wanting to be in her own home.  She will resume her home medications.  Of note, the patient has been allowed wide range in blood sugars, preferring that she run mildly elevated blood sugars rather than strive for very tight control with the risk of hypoglycemia.  As noted above, her A1c does reflect adequate control.  The patient will be seen in the office in followup in 5-7 days.  The patient's condition at time of discharge dictation is medically stable.     Rosalyn Gess Kamrin Spath, MD    MEN/MEDQ  D:  03/31/2012  T:  04/01/2012  Job:  [id number]  cc:   Lajuana Matte, M.D.

## 2012-04-01 NOTE — Telephone Encounter (Signed)
Lesli Albee, clinical manager for [facility] is requesting a written order to continue Home Health Service for patient per request of patient son.  Fax # [phone]    CB# [phone]

## 2012-04-01 NOTE — Telephone Encounter (Signed)
Letter faxed to  [facility]

## 2012-04-01 NOTE — Telephone Encounter (Signed)
Returned call to [employer]. Requesting documentation to say continue skilled Nurse Care and Physical Therapy.Marland Kitchen CB# [phone].

## 2012-04-01 NOTE — Telephone Encounter (Signed)
Letter done

## 2012-04-01 NOTE — Telephone Encounter (Signed)
Maureen Ralphs called from [employer], she stated that she received order from Dr. Debby Bud to resume all home health for this pt, but Maureen Ralphs stated that she need for the order to be more specific. Please call her with any question. [phone].

## 2012-04-07 ENCOUNTER — Ambulatory Visit: Payer: Medicare Other | Admitting: Internal Medicine

## 2012-04-13 ENCOUNTER — Telehealth: Payer: Self-pay | Admitting: Internal Medicine

## 2012-04-13 NOTE — Telephone Encounter (Signed)
Caller: [name]; Patient Name: [name]; PCP: Illene Regulus (Adults only); Best Callback Phone Number: [phone] [facility]men was in the hospital approximately 2 weeks ago for blood transfusions.  Is now having home nursing care.  Canceled follow up appointment on 04/07/12.  Started feeling weak and tired on 04/08/12.  States that home health nurse is coming today between 3:00 and 4:00 today. Home Health Nurse is Carilyn Goodpasture [phone].   Requesting order for Hgb to be tested today. Utilized Fatigue Guideline.  See Provider within 24 hours due to "geriatric person and recent onset of decreased ability to carry out ADL's ".  Jasmine December declines appointment and is asking for lab orders to be done by [facility] today. OFFICE, PLEASE FOLLOW UP WITH SHARON.

## 2012-04-13 NOTE — Telephone Encounter (Signed)
Ok to draw Hgb/Hct for anemia.

## 2012-04-13 NOTE — Telephone Encounter (Signed)
HHRN advised, will draw CBC.

## 2012-04-13 NOTE — Telephone Encounter (Signed)
Left message on machine for [facility] to return my call verbal authorization to draw hemoglobin/hematocrit (Anemia) per MEN, pt's daughter advised of same.

## 2012-04-14 ENCOUNTER — Other Ambulatory Visit: Payer: Self-pay | Admitting: Internal Medicine

## 2012-04-14 DIAGNOSIS — D638 Anemia in other chronic diseases classified elsewhere: Secondary | ICD-10-CM

## 2012-04-14 DIAGNOSIS — D509 Iron deficiency anemia, unspecified: Secondary | ICD-10-CM

## 2012-04-14 DIAGNOSIS — D539 Nutritional anemia, unspecified: Secondary | ICD-10-CM

## 2012-04-15 NOTE — Telephone Encounter (Signed)
Patient request.  refill on temazepam and promethazine. Patient of Dr. Debby Bud. . Has appt with Dr Debby Bud on 04/23/2012. Please advise

## 2012-04-18 ENCOUNTER — Telehealth: Payer: Self-pay

## 2012-04-18 NOTE — Telephone Encounter (Signed)
Pt's sister called requesting results of Hgb

## 2012-04-18 NOTE — Telephone Encounter (Signed)
SPOKE WITH DAUGHTER. SHE WAS REFERRING TO THE BLOOD TEST THAT [facility] DID ON LAST WEEK 04/13/2012. SHE RECEIVED THOSE RESULTS FROM [facility] . HGB 9.1. DAUGHTER STATES HAS APPT ON Thursday AND QUESTION ABOUT HEPATITIS AND HIV PROBLEMS WITH THE AMOUNT OF BLOOD PROBLEMS MOTHER IS HAVING

## 2012-04-18 NOTE — Telephone Encounter (Signed)
Needs OV Dr Debby Bud

## 2012-04-18 NOTE — Telephone Encounter (Signed)
I believe this is the patient's daughter. Last Hgb 8/22 was 10.2 gr.

## 2012-04-19 NOTE — Telephone Encounter (Signed)
Patient daughter notified of no need at this time for testing that she was asking about on last call. Patient has appt. On Thursday with Dr. Debby Bud.

## 2012-04-19 NOTE — Telephone Encounter (Signed)
Results from 9/4 not in EPIC - will need to search for them. No history of HIV risk so she hasn't been tested. Very,Very unlikely. She has had normal liver functions with no evidence to suggest hepatitis.

## 2012-04-20 ENCOUNTER — Other Ambulatory Visit: Payer: Self-pay | Admitting: Internal Medicine

## 2012-04-20 NOTE — Telephone Encounter (Signed)
error 

## 2012-04-21 ENCOUNTER — Ambulatory Visit (INDEPENDENT_AMBULATORY_CARE_PROVIDER_SITE_OTHER): Payer: Medicare Other | Admitting: Internal Medicine

## 2012-04-21 ENCOUNTER — Telehealth: Payer: Self-pay | Admitting: Internal Medicine

## 2012-04-21 ENCOUNTER — Encounter: Payer: Self-pay | Admitting: Internal Medicine

## 2012-04-21 VITALS — BP 102/42 | HR 98 | Temp 98.8°F | Resp 14 | Wt 124.0 lb

## 2012-04-21 DIAGNOSIS — Z23 Encounter for immunization: Secondary | ICD-10-CM

## 2012-04-21 DIAGNOSIS — D649 Anemia, unspecified: Secondary | ICD-10-CM

## 2012-04-21 DIAGNOSIS — E119 Type 2 diabetes mellitus without complications: Secondary | ICD-10-CM

## 2012-04-21 NOTE — Telephone Encounter (Signed)
[name] thinks his mother has contacted social services complaining about her children not giving her medicine correctly.  He would like a call from Dr. Debby Bud to let him know he is following her medicine schedule.  [name] has an appointment today.  Her daughter will be coming with her.  She is having problems with her blood sugar.   He and his sister met with the person from social services yesterday.

## 2012-04-22 ENCOUNTER — Encounter: Payer: Self-pay | Admitting: Internal Medicine

## 2012-04-24 NOTE — Progress Notes (Signed)
  Subjective:    Patient ID: [name], female    DOB: 16-Dec-1919, 78 y.o.   MRN: [id number]  HPI Presents for Hospital follow-up after recent admission for anemia - chronic recurrent blood loss AVMs. In the interval since d/c she has been doing well. Her home CBGs have been a bit high, but due to advanced age and fragility tight control is not the goal.  PMH, FamHx and SocHx reviewed for any changes and relevance.  Meds- no change since d/c   Review of Systems System review is negative for any constitutional, cardiac, pulmonary, GI or neuro symptoms or complaints other than as described in the HPI.     Objective:   Physical Exam Filed Vitals:   04/21/12 1523  BP: 102/42  Pulse: 98  Temp: 98.8 F (37.1 C)  Resp: 14   Wt Readings from Last 3 Encounters:  04/21/12 124 lb (56.246 kg)  03/31/12 120 lb 5.9 oz (54.6 kg)  03/28/12 125 lb 3.4 oz (56.795 kg)   Gen'l- elderly whie woman in no distress  Cor- RRR Pulm - no increased WOB, lungs CTAP Abd- soft Neuro - A&O x 3,        Assessment & Plan:

## 2012-04-24 NOTE — Assessment & Plan Note (Signed)
Lab Results  Component Value Date   HGBA1C 6.5* 03/28/2012   Patients daily CBGs allowed to run high to avoid hypotglycemia. Her overall control is excellent.  Plan Continue present regimen.

## 2012-04-24 NOTE — Assessment & Plan Note (Signed)
Recent hospitalization and transfusion for chronic blood loss anemia. Doing well since being home. Last Hgb Sept 4, '13 = 9.1  Plan - continued monitoring

## 2012-04-26 ENCOUNTER — Other Ambulatory Visit: Payer: Self-pay | Admitting: Internal Medicine

## 2012-05-04 ENCOUNTER — Telehealth: Payer: Self-pay

## 2012-05-04 NOTE — Telephone Encounter (Signed)
A user error has taken place: encounter opened in error, closed for administrative reasons.

## 2012-05-05 ENCOUNTER — Other Ambulatory Visit: Payer: Self-pay | Admitting: *Deleted

## 2012-05-05 ENCOUNTER — Telehealth: Payer: Self-pay | Admitting: Internal Medicine

## 2012-05-05 MED ORDER — METFORMIN HCL 500 MG PO TABS: ORAL_TABLET | ORAL | Status: DC

## 2012-05-05 NOTE — Telephone Encounter (Signed)
s.w. pt and advised of 10.18.13 appt change to 10.15 and 10.21

## 2012-05-12 ENCOUNTER — Telehealth: Payer: Self-pay | Admitting: Internal Medicine

## 2012-05-12 DIAGNOSIS — D649 Anemia, unspecified: Secondary | ICD-10-CM

## 2012-05-12 NOTE — Telephone Encounter (Signed)
Caller: Sharon/Patient; Patient Name: [name]; PCP: Illene Regulus (Adults only); Best Callback Phone Number: [phone].  Daughter calling requesting order for blood work, to see if pt. is in need of another transfusion.  Pt. is becoming more fatigued and tired again.  Attempted to triage pt.s symptoms but Daughter was on a phone with pre-paid minutes which were almost out, and away from home and declined [place].  She requested that bloodwork be done, and wanted the order this afternoon.  Please do not use phone above as this is the phone that is out of pre-paid minutes .  Please contact Daugther at home #:  [phone].  CAN/db.

## 2012-05-12 NOTE — Telephone Encounter (Signed)
Ok for hgb/hct - order in, they can come to the lab at any time.

## 2012-05-13 NOTE — Telephone Encounter (Signed)
Pt's daughter informed lab order placed into system, she states that she will bring mother in on Monday.

## 2012-05-24 ENCOUNTER — Other Ambulatory Visit (HOSPITAL_BASED_OUTPATIENT_CLINIC_OR_DEPARTMENT_OTHER): Payer: Medicare Other | Admitting: Lab

## 2012-05-24 DIAGNOSIS — D649 Anemia, unspecified: Secondary | ICD-10-CM

## 2012-05-24 DIAGNOSIS — D539 Nutritional anemia, unspecified: Secondary | ICD-10-CM

## 2012-05-24 LAB — CBC WITH DIFFERENTIAL/PLATELET
Basophils Absolute: 0 10*3/uL (ref 0.0–0.1)
EOS%: 1 % (ref 0.0–7.0)
Eosinophils Absolute: 0.1 10*3/uL (ref 0.0–0.5)
HCT: 25.1 % — ABNORMAL LOW (ref 34.8–46.6)
HGB: 8.4 g/dL — ABNORMAL LOW (ref 11.6–15.9)
MCH: 33.1 pg (ref 25.1–34.0)
MONO#: 0.5 10*3/uL (ref 0.1–0.9)
NEUT#: 5.7 10*3/uL (ref 1.5–6.5)
NEUT%: 79.9 % — ABNORMAL HIGH (ref 38.4–76.8)
lymph#: 0.9 10*3/uL (ref 0.9–3.3)

## 2012-05-27 ENCOUNTER — Ambulatory Visit: Payer: Medicare Other | Admitting: Physician Assistant

## 2012-05-28 LAB — IRON AND TIBC
%SAT: 11 % — ABNORMAL LOW (ref 20–55)
TIBC: 431 ug/dL (ref 250–470)

## 2012-05-28 LAB — VITAMIN B12: Vitamin B-12: 1188 pg/mL — ABNORMAL HIGH (ref 211–911)

## 2012-05-28 LAB — FERRITIN: Ferritin: 38 ng/mL (ref 10–291)

## 2012-05-28 LAB — FOLATE: Folate: >20 ng/mL

## 2012-05-30 ENCOUNTER — Ambulatory Visit (HOSPITAL_BASED_OUTPATIENT_CLINIC_OR_DEPARTMENT_OTHER): Payer: Medicare Other | Admitting: Physician Assistant

## 2012-05-30 ENCOUNTER — Encounter: Payer: Self-pay | Admitting: Physician Assistant

## 2012-05-30 ENCOUNTER — Telehealth: Payer: Self-pay | Admitting: Internal Medicine

## 2012-05-30 VITALS — BP 132/54 | HR 80 | Temp 98.4°F | Resp 18 | Ht 64.0 in | Wt 124.6 lb

## 2012-05-30 DIAGNOSIS — D509 Iron deficiency anemia, unspecified: Secondary | ICD-10-CM

## 2012-05-30 DIAGNOSIS — D638 Anemia in other chronic diseases classified elsewhere: Secondary | ICD-10-CM

## 2012-05-30 NOTE — Patient Instructions (Addendum)
Continue taking Integra plus, 1 capsule by mouth daily Followup with Dr. Arbutus Ped in 2 months with laboratory studies drawn one week prior to your appointment

## 2012-05-30 NOTE — Telephone Encounter (Signed)
Printed and gv pt appt schedule for Dec. °

## 2012-05-31 NOTE — Progress Notes (Signed)
[facility] [phone]   [phone]  OFFICE PROGRESS NOTE  Illene Regulus, MD [address] [place] [address]  PRINCIPAL DIAGNOSIS: Anemia of chronic disease plus iron deficiency.   PRIOR THERAPY: Status post Feraheme infusion x6 doses; last dose was given on March 11, 2011.   CURRENT THERAPY: Integra Plus 1 capsule p.o. daily.  INTERVAL HISTORY: [name] 78 y.o. female returns to the clinic today for followup visit accompanied by her son. She continues to tolerate her therapy with Integra plus relatively well. She does have some difficulty with constipation and is currently taking 2 tablets of Colace daily. She complains of feeling a keep particularly in the left hip. This is an area where she has known arthritis. She voiced no other specific complaints. She denied having any significant chest pain, shortness breath, cough or hemoptysis. She has no bleeding issues. No bruises or ecchymosis. She continues to have some fatigue.  MEDICAL HISTORY: Past Medical History  Diagnosis Date  . Inguinal hernia   . Mitral valve insufficiency and aortic valve insufficiency   . Other and unspecified hyperlipidemia   . Pneumonia   . Osteoarthrosis, unspecified whether generalized or localized, unspecified site   . Nephrolithiasis   . Unspecified essential hypertension   . Type II or unspecified type diabetes mellitus without mention of complication, not stated as uncontrolled   . Coronary atherosclerosis of unspecified type of vessel, native or graft   . Congestive heart failure, unspecified   . Atrial fibrillation   . Unspecified asthma   . Anemia, unspecified   . Allergic rhinitis, cause unspecified   . Colon polyps 10/2009    ALLERGIES:  is allergic to codeine.  MEDICATIONS:  Current Outpatient Prescriptions  Medication Sig Dispense Refill  . acetaminophen (TYLENOL) 500 MG tablet Take 1,000 mg by mouth 2 (two) times daily. For hip  pain      . Cholecalciferol (VITAMIN D3) 1000 UNITS CAPS Take by mouth daily.      . clonazePAM (KLONOPIN) 0.5 MG tablet Take 0.5 mg by mouth 2 (two) times daily as needed. For anxiety      . digoxin (LANOXIN) 0.125 MG tablet Take 0.125 mg by mouth daily.      Marland Kitchen doxazosin (CARDURA) 4 MG tablet Take 4 mg by mouth at bedtime.      Marland Kitchen doxazosin (CARDURA) 4 MG tablet TAKE ONE TABLET BY MOUTH AT BEDTIME FOR  BLOOD  PRESSURE  30 tablet  6  . Fe Fum-FePoly-FA-Vit C-Vit B3 (INTEGRA F) 125-1 MG CAPS Take 1 capsule by mouth daily.      Marland Kitchen FeFum-FePoly-FA-B Cmp-C-Biot (INTEGRA PLUS) CAPS TAKE ONE CAPSULE BY MOUTH EVERY DAY.  30 each  1  . fexofenadine (ALLEGRA) 180 MG tablet Take 180 mg by mouth daily.      . furosemide (LASIX) 40 MG tablet Take 40 mg by mouth 2 (two) times daily. Takes 1 1/2 tablets by mouth twice daily      . glucose blood test strip Test 2 (two) times daily. DX 250.00  100 each  11  . HYDROcodone-acetaminophen (NORCO/VICODIN) 5-325 MG per tablet       . insulin glargine (LANTUS SOLOSTAR) 100 UNIT/ML injection Inject 24 Units into the skin at bedtime.      . irbesartan (AVAPRO) 300 MG tablet Take 150 mg by mouth daily.       . metFORMIN (GLUCOPHAGE) 500 MG tablet Take 500 mg by mouth 2 (  two) times daily with a meal.  180 tablet  3  . metoCLOPramide (REGLAN) 5 MG tablet Take 1 tablet (5 mg total) by mouth 2 (two) times daily.  60 tablet  11  . metoCLOPramide (REGLAN) 5 MG tablet Take 5 mg by mouth 2 (two) times daily.      . metolazone (ZAROXOLYN) 2.5 MG tablet Take 2.5 mg by mouth See admin instructions. Every Monday Wednesday and Friday 30 minutes before Furosemide      . potassium chloride SA (K-DUR,KLOR-CON) 20 MEQ tablet Take 20 mEq by mouth 3 (three) times daily.      . promethazine (PHENERGAN) 12.5 MG tablet TAKE ONE TABLET BY MOUTH EVERY 6 HOURS AS NEEDED FOR NAUSEA  30 tablet  0  . RELION MINI PEN NEEDLES 31G X 6 MM MISC 1 Container.      . simvastatin (ZOCOR) 20 MG tablet       .  temazepam (RESTORIL) 15 MG capsule TAKE ONE CAPSULE BY MOUTH AT BEDTIME  30 capsule  5  . temazepam (RESTORIL) 7.5 MG capsule Take 7.5 mg by mouth at bedtime.        SURGICAL HISTORY:  Past Surgical History  Procedure Date  . Breast cyst aspiration     x50  . Abdominal hysterectomy 1990  . Lumbar fusion 2007    [facility]  . Total hip arthroplasty 01/2007    Right - Dr Valentina Gu  . Inguinal hernia repair 2010    [facility]osen[facility]ower    REVIEW OF SYSTEMS:  A comprehensive review of systems was negative except for: Constitutional: positive for fatigue Gastrointestinal: positive for constipation Musculoskeletal: positive for arthralgias   PHYSICAL EXAMINATION: General appearance: alert, cooperative and no distress Neck: no adenopathy Lymph nodes: Cervical, supraclavicular, and axillary nodes normal. Resp: clear to auscultation bilaterally Cardio: regular rate and rhythm, S1, S2 normal, no murmur, click, rub or gallop GI: soft, non-tender; bowel sounds normal; no masses,  no organomegaly Extremities: extremities normal, atraumatic, no cyanosis or edema  ECOG PERFORMANCE STATUS: 2 - Symptomatic, <50% confined to bed  Blood pressure 132/54, pulse 80, temperature 98.4 F (36.9 C), temperature source Oral, resp. rate 18, height 5\' 4"  (1.626 m), weight 124 lb 9.6 oz (56.518 kg).  LABORATORY DATA: Lab Results  Component Value Date   WBC 7.2 05/24/2012   HGB 8.4* 05/24/2012   HCT 25.1* 05/24/2012   MCV 98.6 05/24/2012   PLT 181 05/24/2012      Chemistry      Component Value Date/Time   NA 138 03/29/2012 0635   K 3.2* 03/29/2012 0635   CL 96 03/29/2012 0635   CO2 30 03/29/2012 0635   BUN 59* 03/29/2012 0635   CREATININE 0.75 03/29/2012 0635      Component Value Date/Time   CALCIUM 9.6 03/29/2012 0635   ALKPHOS 153* 01/20/2012 1727   AST 27 01/20/2012 1727   ALT 21 01/20/2012 1727   BILITOT 1.6* 01/20/2012 1727       RADIOGRAPHIC STUDIES: No results found.  ASSESSMENT/PLAN: This is a very  pleasant 78 years old white female with anemia of chronic disease plus iron deficiency currently on oral iron treatment but continues to have persistent anemia. Her iron studies are relatively stable and within normal ranges with the exception of a low iron saturation at 11%. Her hemoglobin is relatively stable at 8.4 g/dL. Patient was discussed with Dr. Arbutus Ped. She will continue on Integra plus, 1 capsule by mouth daily. She'll followup with Dr. Arbutus Ped in 2  months with a repeat CBC differential, iron and TIBC and ferritin. She is advised to increase her Colace to 2 tablets twice daily for her constipation.   [name], [name] Parco E, PA-C   All questions were answered. The patient knows to call the clinic with any problems, questions or concerns. We can certainly see the patient much sooner if necessary.

## 2012-06-06 ENCOUNTER — Other Ambulatory Visit: Payer: Self-pay | Admitting: Internal Medicine

## 2012-06-21 ENCOUNTER — Telehealth: Payer: Self-pay | Admitting: Cardiovascular Disease

## 2012-06-21 MED ORDER — FUROSEMIDE 40 MG PO TABS: 60.0000 mg | ORAL_TABLET | Freq: Two times a day (BID) | ORAL | Status: DC

## 2012-06-21 NOTE — Telephone Encounter (Signed)
New rx for furosemide sent to [facility] on [address] per pt request.

## 2012-06-21 NOTE — Telephone Encounter (Signed)
Please return call to patient [phone] regarding medication dosage increase and new prescription to increase quantity. Pt is currently out of medication

## 2012-06-22 ENCOUNTER — Telehealth: Payer: Self-pay | Admitting: Internal Medicine

## 2012-06-22 NOTE — Telephone Encounter (Signed)
Son/Greg calling about metformin dose.  States believes dosing is to be twice per day, and patient thinks she is to take metformin only once per day.  Per Epic, metformin Rx last written 05/05/12 for 500mg  1 po BID with meals.  Advised caller; no further questions or concerns.  krs/can

## 2012-06-23 ENCOUNTER — Telehealth: Payer: Self-pay | Admitting: Internal Medicine

## 2012-06-23 DIAGNOSIS — E119 Type 2 diabetes mellitus without complications: Secondary | ICD-10-CM

## 2012-06-23 DIAGNOSIS — D649 Anemia, unspecified: Secondary | ICD-10-CM

## 2012-06-23 NOTE — Telephone Encounter (Signed)
Patient Information:  Caller Name: Stasha  Phone: [phone]  Patient: [name], [place]  Gender: Female  DOB: 1920-03-30  Age: 78 Years  PCP: Illene Regulus (Adults only)   Symptoms  Reason For Call & Symptoms: Blood Sugar running High  Reviewed Health History In EMR: Yes  Reviewed Medications In EMR: Yes  Reviewed Allergies In EMR: Yes  Date of Onset of Symptoms: 05/17/2012  Guideline(s) Used:  Diabetes - High Blood Sugar  Disposition Per Guideline:   Discuss with PCP and Callback by Nurse within 1 Hour  Reason For Disposition Reached:   Blood glucose > 300 mg/dl (09.8 mmol/l) AND two or more times in a row  Advice Given:  Call Back If:  Vomiting lasting more than 4 hours or unable to drink any liquids.  Rapid breathing occurs  You become worse.  Office Follow Up:  Does the office need to follow up with this patient?: Yes  Instructions For The Office: Please follow up with Tammy Sours patients son at [phone] with Care advice on medication changes or appointment time if office evaluation is recommended.   RN Note:  Blood sugar 411 on 06/22/12 and 389 at 0830 on 06/23/12 before breakfast. Baseline is 200 - 225. No confusion, no rapid breathing, patient has taken in fluids this morning however has CHF so is cautious with fluid intake.

## 2012-06-23 NOTE — Telephone Encounter (Signed)
Note also printed and given to [facility] for MEN review

## 2012-06-24 ENCOUNTER — Telehealth: Payer: Self-pay | Admitting: *Deleted

## 2012-06-24 ENCOUNTER — Other Ambulatory Visit (INDEPENDENT_AMBULATORY_CARE_PROVIDER_SITE_OTHER): Payer: Medicare Other

## 2012-06-24 DIAGNOSIS — E119 Type 2 diabetes mellitus without complications: Secondary | ICD-10-CM

## 2012-06-24 DIAGNOSIS — D649 Anemia, unspecified: Secondary | ICD-10-CM

## 2012-06-24 LAB — COMPREHENSIVE METABOLIC PANEL
ALT: 25 U/L (ref 0–35)
AST: 23 U/L (ref 0–37)
Albumin: 4 g/dL (ref 3.5–5.2)
Alkaline Phosphatase: 84 U/L (ref 39–117)
Calcium: 10 mg/dL (ref 8.4–10.5)
Chloride: 93 mEq/L — ABNORMAL LOW (ref 96–112)
Potassium: 4.1 mEq/L (ref 3.5–5.1)

## 2012-06-24 LAB — CBC WITH DIFFERENTIAL/PLATELET
Basophils Absolute: 0 10*3/uL (ref 0.0–0.1)
Eosinophils Absolute: 0.1 10*3/uL (ref 0.0–0.7)
Lymphocytes Relative: 9.9 % — ABNORMAL LOW (ref 12.0–46.0)
MCHC: 31.5 g/dL (ref 30.0–36.0)
MCV: 98.6 fl (ref 78.0–100.0)
Monocytes Absolute: 0.6 10*3/uL (ref 0.1–1.0)
Neutrophils Relative %: 80.2 % — ABNORMAL HIGH (ref 43.0–77.0)
Platelets: 182 10*3/uL (ref 150.0–400.0)
RDW: 16.1 % — ABNORMAL HIGH (ref 11.5–14.6)

## 2012-06-24 NOTE — Telephone Encounter (Signed)
Pt's son states that pt has been increasing her insulin per MD direction and is up to 30 units for the past 2 weeks. Please advise on Lantus dosing.

## 2012-06-24 NOTE — Telephone Encounter (Signed)
1. Increase to 34 units 2. Check AM fasting blood sugar, after this increase, for three days and relay that information back to [place].

## 2012-06-24 NOTE — Telephone Encounter (Signed)
Pt advised in detail and will call back Monday or Tuesday of next week with readings.

## 2012-06-24 NOTE — Telephone Encounter (Signed)
Called pt's sonTammy Sours and notified him of critical hgb of 7.9. And per Dr. Posey Rea advised him to take pt to ER now.

## 2012-06-24 NOTE — Telephone Encounter (Signed)
1. Increase Lantus to 29 units daily 2. Continue metformin 3. Lab at patients convenience Friday or Monday: Bmet, CBC, A1C -orders entered

## 2012-06-25 ENCOUNTER — Other Ambulatory Visit: Payer: Self-pay

## 2012-06-25 ENCOUNTER — Emergency Department (HOSPITAL_COMMUNITY)
Admission: EM | Admit: 2012-06-25 | Discharge: 2012-06-25 | Disposition: A | Discharge status: Home / Self Care | Payer: Medicare Other | Attending: Emergency Medicine | Admitting: Emergency Medicine

## 2012-06-25 DIAGNOSIS — R5383 Other fatigue: Secondary | ICD-10-CM

## 2012-06-25 DIAGNOSIS — Z8719 Personal history of other diseases of the digestive system: Secondary | ICD-10-CM | POA: Insufficient documentation

## 2012-06-25 DIAGNOSIS — E785 Hyperlipidemia, unspecified: Secondary | ICD-10-CM | POA: Insufficient documentation

## 2012-06-25 DIAGNOSIS — J45909 Unspecified asthma, uncomplicated: Secondary | ICD-10-CM | POA: Insufficient documentation

## 2012-06-25 DIAGNOSIS — E1169 Type 2 diabetes mellitus with other specified complication: Secondary | ICD-10-CM | POA: Insufficient documentation

## 2012-06-25 DIAGNOSIS — J309 Allergic rhinitis, unspecified: Secondary | ICD-10-CM | POA: Insufficient documentation

## 2012-06-25 DIAGNOSIS — N200 Calculus of kidney: Secondary | ICD-10-CM | POA: Insufficient documentation

## 2012-06-25 DIAGNOSIS — I4891 Unspecified atrial fibrillation: Secondary | ICD-10-CM

## 2012-06-25 DIAGNOSIS — E119 Type 2 diabetes mellitus without complications: Secondary | ICD-10-CM | POA: Diagnosis present

## 2012-06-25 DIAGNOSIS — I10 Essential (primary) hypertension: Secondary | ICD-10-CM | POA: Insufficient documentation

## 2012-06-25 DIAGNOSIS — M1990 Unspecified osteoarthritis, unspecified site: Secondary | ICD-10-CM | POA: Insufficient documentation

## 2012-06-25 DIAGNOSIS — I080 Rheumatic disorders of both mitral and aortic valves: Secondary | ICD-10-CM | POA: Insufficient documentation

## 2012-06-25 DIAGNOSIS — Z794 Long term (current) use of insulin: Secondary | ICD-10-CM | POA: Insufficient documentation

## 2012-06-25 DIAGNOSIS — D649 Anemia, unspecified: Secondary | ICD-10-CM | POA: Diagnosis present

## 2012-06-25 DIAGNOSIS — D638 Anemia in other chronic diseases classified elsewhere: Secondary | ICD-10-CM | POA: Insufficient documentation

## 2012-06-25 DIAGNOSIS — I509 Heart failure, unspecified: Secondary | ICD-10-CM | POA: Insufficient documentation

## 2012-06-25 DIAGNOSIS — I2510 Atherosclerotic heart disease of native coronary artery without angina pectoris: Secondary | ICD-10-CM | POA: Insufficient documentation

## 2012-06-25 DIAGNOSIS — R5381 Other malaise: Secondary | ICD-10-CM | POA: Insufficient documentation

## 2012-06-25 DIAGNOSIS — Z8701 Personal history of pneumonia (recurrent): Secondary | ICD-10-CM | POA: Insufficient documentation

## 2012-06-25 DIAGNOSIS — R7309 Other abnormal glucose: Secondary | ICD-10-CM

## 2012-06-25 DIAGNOSIS — R739 Hyperglycemia, unspecified: Secondary | ICD-10-CM

## 2012-06-25 DIAGNOSIS — Z79899 Other long term (current) drug therapy: Secondary | ICD-10-CM | POA: Insufficient documentation

## 2012-06-25 LAB — CBC
HCT: 24.8 % — ABNORMAL LOW (ref 36.0–46.0)
MCH: 31.6 pg (ref 26.0–34.0)
MCV: 100.4 fL — ABNORMAL HIGH (ref 78.0–100.0)
Platelets: 179 10*3/uL (ref 150–400)
RBC: 2.47 MIL/uL — ABNORMAL LOW (ref 3.87–5.11)
RDW: 15.3 % (ref 11.5–15.5)

## 2012-06-25 LAB — URINALYSIS, ROUTINE W REFLEX MICROSCOPIC
Bilirubin Urine: NEGATIVE
Hgb urine dipstick: NEGATIVE
Protein, ur: NEGATIVE mg/dL
Urobilinogen, UA: 0.2 mg/dL (ref 0.0–1.0)

## 2012-06-25 LAB — BASIC METABOLIC PANEL
BUN: 55 mg/dL — ABNORMAL HIGH (ref 6–23)
CO2: 28 mEq/L (ref 19–32)
Calcium: 10.1 mg/dL (ref 8.4–10.5)
Chloride: 94 mEq/L — ABNORMAL LOW (ref 96–112)
Creatinine, Ser: 0.93 mg/dL (ref 0.50–1.10)

## 2012-06-25 LAB — TYPE AND SCREEN: ABO/RH(D): A POS

## 2012-06-25 MED ORDER — SODIUM CHLORIDE 0.9 % IV SOLN: Freq: Once | INTRAVENOUS | Status: DC

## 2012-06-25 MED ORDER — SODIUM CHLORIDE 0.9 % IV BOLUS (SEPSIS)
500.0000 mL | Freq: Once | INTRAVENOUS | Status: AC
  Administered 2012-06-25: 500 mL via INTRAVENOUS

## 2012-06-25 NOTE — ED Provider Notes (Signed)
See prior note   Ward Givens, MD 06/25/12 1443

## 2012-06-25 NOTE — ED Notes (Signed)
Was called from Dr's office and told to come here, hgb that was drawn yesterday was low- 7.9-- has had low hgb in past, normally comes to hospital for transfusions, and has had iron infusions. Alert/oriented x 3,

## 2012-06-25 NOTE — ED Notes (Signed)
Pt alert and oriented family at bedside. Pt comes in aprox every 3 months for transfusions. No complain of pain.

## 2012-06-25 NOTE — ED Provider Notes (Signed)
Pt has hx of anemia followed by Dr Shirline Frees. Last transfusion was in August. Was called today and told to come to the ED because of anemia from outpatient blood work.  She reports have weakness for a few weeks. She denies chest pain, shortness of breath, feeling dizzy.  Daughter states she gets 3 units and if she is down to 6 she gets 4 units.   PCP Dr Debby Bud  Medical screening examination/treatment/procedure(s) were conducted as a shared visit with non-physician practitioner(s) and myself.  I personally evaluated the patient during the encounter   Devoria Albe, MD, Franz Dell, MD 06/25/12 [phone]

## 2012-06-25 NOTE — Consult Note (Signed)
[facility] Medical Consultation  CHARLICIA BLACKHAM OZH:[id number] DOB: Feb 27, 1920 DOA: 06/25/2012 PCP: Illene Regulus, MD   Requesting physician: Jaci Carrel, PA-C Date of consultation: 06/25/2012 Reason for consultation: anemia  Impression/Recommendations 1. Anemia of chronic disease & iron deficiency--patient asymptomatic, with stable Hgb from yesterday and differential from one month ago only 0.6. In absence of symptoms, bleeding, significant drop I cannot advise admission or transfusion at this time. I discussed the rationale behind this extensively with the patient and her daughter. I recommend discharge home. I will defer follow-up to Dr. Debby Bud (I will notify him of consultation). 2. Hyperglycemia--asymptomatic. Insulin currently being titrated by PCP. No changes suggested at this time. 3. DM type 2--as above. 4. Atrial fibrillation--stable. Not a warfarin candidate secondary to anemia.  Brendia Sacks, MD [facility] Pager [phone]  Chief Complaint: none  HPI:  78 year old woman PMH anemia of chronic disease/iron deficiency referred to ED by Dr. Posey Rea for further evaluation of anemia. I was asked to comment on anemia in regard to possible admission and transfusion by EDP,  Patient with history of anemia as above followed by Dr. Debby Bud and Dr. Arbutus Ped. Last seen in office by Tiana Loft 10/21 in follow-up (Hgb 8.4 10/15). At that time complained of fatigue, but had no CP or SOB. Hgb was 8.4 and it was recommended patient continue Integra Plus and follow-up in 2 months for lab work.   Patient with elevated blood sugars over the last two weeks and has had insulin titrated up to 34 units daily. She had routine lab work drawn 11/15 (CBC, A1c, CBC). CBC revealed Hgb 7.9 and Dr. Teola Bradley office contacted patient and suggested referral to ED for further evaluation last evening. Patient presented today for evaluation.  Patient overall feels "fine". No chest pain, no  SOB, no bleeding, no significant dizziness. She has been ambulating with a walker as usual. Her functional capacity and exercise tolerance are poor at baseline secondary to left hip arthritis and a fear of falling. She rarely leaves the house and is reluctant to walk/exercise even when family are with her. She does not have increased DOE above baseline.   History supplemented by daughter at bedside.  In ED was noted to be afebrile with stable vital signs. BMP was unremarkable except for glucose of 355 (was 427 yesterday). Hgb 7.8 (stable from 11/15). Hgb was 8/.4 10/15.  EKG revealed atrial fibrillation, VR 80, LVH, no acute changes.  Review of Systems:  Negative for fever, visual changes, sore throat, rash, new muscle aches, chest pain, SOB, dysuria, bleeding, n/v/abdominal pain.  Past Medical History  Diagnosis Date  . Inguinal hernia   . Mitral valve insufficiency and aortic valve insufficiency   . Other and unspecified hyperlipidemia   . Pneumonia   . Osteoarthrosis, unspecified whether generalized or localized, unspecified site   . Nephrolithiasis   . Unspecified essential hypertension   . Type II or unspecified type diabetes mellitus without mention of complication, not stated as uncontrolled   . Coronary atherosclerosis of unspecified type of vessel, native or graft   . Congestive heart failure, unspecified   . Atrial fibrillation   . Unspecified asthma   . Anemia, unspecified   . Allergic rhinitis, cause unspecified   . Colon polyps 10/2009   Past Surgical History  Procedure Date  . Breast cyst aspiration     x50  . Abdominal hysterectomy 1990  . Lumbar fusion 2007    [facility]  . Total hip arthroplasty 01/2007  Right - Dr Valentina Gu  . Inguinal hernia repair 2010    [facility]osen[facility]ower   Social History:  reports that she has never smoked. She has never used smokeless tobacco. She reports that she does not drink alcohol or use illicit drugs.  Allergies  Allergen Reactions  . Codeine      REACTION: Hives   Family History  Problem Relation Age of Onset  . Heart disease Father     CHF  . Lymphoma Sister   . Colon cancer Neg Hx     Prior to Admission medications   Medication Sig Start Date End Date Taking? Authorizing Provider  acetaminophen (TYLENOL) 500 MG tablet Take 1,000 mg by mouth 2 (two) times daily. For hip pain   Yes Historical Provider, MD  Cholecalciferol (VITAMIN D3) 1000 UNITS CAPS Take by mouth daily.   Yes Historical Provider, MD  digoxin (LANOXIN) 0.125 MG tablet Take 0.125 mg by mouth daily.   Yes Historical Provider, MD  doxazosin (CARDURA) 4 MG tablet Take 4 mg by mouth at bedtime.   Yes Historical Provider, MD  Fe Fum-FePoly-FA-Vit C-Vit B3 (INTEGRA F) 125-1 MG CAPS Take 1 capsule by mouth daily.   Yes Historical Provider, MD  fexofenadine (ALLEGRA) 180 MG tablet Take 180 mg by mouth daily.   Yes Historical Provider, MD  furosemide (LASIX) 40 MG tablet Take 1.5 tablets (60 mg total) by mouth 2 (two) times daily. 06/21/12  Yes Beatrice Lecher, PA  HYDROcodone-acetaminophen (NORCO/VICODIN) 5-325 MG per tablet TAKE ONE TABLET BY MOUTH EVERY 6 HOURS AS NEEDED 06/06/12  Yes Jacques Navy, MD  insulin glargine (LANTUS SOLOSTAR) 100 UNIT/ML injection Inject 24 Units into the skin at bedtime.   Yes Historical Provider, MD  irbesartan (AVAPRO) 300 MG tablet Take 150 mg by mouth daily.    Yes Historical Provider, MD  metFORMIN (GLUCOPHAGE) 500 MG tablet Take 500 mg by mouth 2 (two) times daily. Take 500 mg by mouth 2 (two) times daily with a meal. 05/05/12  Yes Jacques Navy, MD  metolazone (ZAROXOLYN) 2.5 MG tablet TAKE ONE TABLET BY MOUTH IN THE MORNING ON MONDAY, WEDNESDAY, AND FRIDAY 30 MINUTES BEFORE TAKING FUROSEMIDE 06/06/12  Yes Jacques Navy, MD  potassium chloride SA (K-DUR,KLOR-CON) 20 MEQ tablet Take 20 mEq by mouth 3 (three) times daily.   Yes Historical Provider, MD  promethazine (PHENERGAN) 12.5 MG tablet TAKE ONE TABLET BY MOUTH EVERY 6  HOURS AS NEEDED FOR NAUSEA 06/06/12  Yes Jacques Navy, MD  simvastatin (ZOCOR) 20 MG tablet Take 20 mg by mouth daily.  03/31/12  Yes Historical Provider, MD  temazepam (RESTORIL) 15 MG capsule TAKE ONE CAPSULE BY MOUTH AT BEDTIME 04/20/12  Yes Jacques Navy, MD  glucose blood test strip Test 2 (two) times daily. DX 250.00 01/21/12   Jacques Navy, MD  metoCLOPramide (REGLAN) 5 MG tablet Take 1 tablet (5 mg total) by mouth 2 (two) times daily. 12/17/11 12/27/11  Jacques Navy, MD  RELION MINI PEN NEEDLES 31G X 6 MM MISC 1 Container. 02/25/12   Historical Provider, MD   Physical Exam: Filed Vitals:   06/25/12 1041 06/25/12 1158 06/25/12 1159 06/25/12 1202  BP: 124/42 114/37 136/47 148/45  Pulse: 84 81 89 89  Temp: 98 F (36.7 C)     TempSrc: Oral     Resp: 18     SpO2: 95%       General:  Examined in ED. Appears calm and comfortable Eyes: PERRL,  normal lids, irises  ENT: grossly normal hearing, lips & tongue Neck: no LAD, masses or thyromegaly Cardiovascular: irregular, no m/r/g. No LE edema. Respiratory: CTA bilaterally, no w/r/r. Normal respiratory effort. Abdomen: soft, ntnd Skin: no rash or induration seen on limited exam Musculoskeletal: grossly normal tone BUE/BLE Psychiatric: grossly normal mood and affect, speech fluent and appropriate Neurologic: grossly non-focal.  Labs on Admission:  Basic Metabolic Panel:  Lab 06/25/12 1610 06/24/12 1617  NA 135 132*  K 4.0 4.1  CL 94* 93*  CO2 28 29  GLUCOSE 355* 427*  BUN 55* 61*  CREATININE 0.93 1.2  CALCIUM 10.1 10.0  MG -- --  PHOS -- --   Liver Function Tests:  Lab 06/24/12 1617  AST 23  ALT 25  ALKPHOS 84  BILITOT 0.7  PROT 7.1  ALBUMIN 4.0   CBC:  Lab 06/25/12 1133 06/24/12 1617  WBC 4.7 6.9  NEUTROABS -- 5.5  HGB 7.8* 7.9 Repeated and verified X2.*  HCT 24.8* 25.2 Repeated and verified X2.*  MCV 100.4* 98.6  PLT 179 182.0   EKG: Independently reviewed. As above  Active Problems:  DIABETES  MELLITUS, TYPE II  ANEMIA-NOS  Hyperglycemia    Time spent: 65 minutes, >50% in counseling and coordination of care  Brendia Sacks, MD [facility] Team 2 Pager [phone]  If 8PM-8AM, please contact night-coverage [email or web address] Password [facility] 06/25/2012, 2:24 PM

## 2012-06-25 NOTE — ED Provider Notes (Addendum)
History     CSN: [id number]  Arrival date & time 06/25/12  1033   First MD Initiated Contact with Patient 06/25/12 1053      Chief Complaint  Patient presents with  . low hemoglobin     (Consider location/radiation/quality/duration/timing/severity/associated sxs/prior treatment) HPI Comments: 78 year old female with a history of rate controlled atrial fibrillation,  anemia of chronic disease/iron deficiency (last transfusion 8/17, hemoglobin of 6.4 requiring 4 units PRBCs, followed by Dr. Gwenyth Bouillon), and diabetes presents emergency department after being called by PCP office for a hemoglobin of 7.9 with advice to present to the emergency department.  Patient presents with mild symptoms including generalized weakness and lightheadedness.  Anemia has been worked up in the past thoroughly with no evidence of GI bleed.  Patient currently denies any melena, hematuria, hematochezia, recent trauma, abdominal pain, back pain, syncope, weight loss fever, night sweats or chills.  Pt receives chronic Feraheme infusions as treatment.   The history is provided by the patient.    Past Medical History  Diagnosis Date  . Inguinal hernia   . Mitral valve insufficiency and aortic valve insufficiency   . Other and unspecified hyperlipidemia   . Pneumonia   . Osteoarthrosis, unspecified whether generalized or localized, unspecified site   . Nephrolithiasis   . Unspecified essential hypertension   . Type II or unspecified type diabetes mellitus without mention of complication, not stated as uncontrolled   . Coronary atherosclerosis of unspecified type of vessel, native or graft   . Congestive heart failure, unspecified   . Atrial fibrillation   . Unspecified asthma   . Anemia, unspecified   . Allergic rhinitis, cause unspecified   . Colon polyps 10/2009    Past Surgical History  Procedure Date  . Breast cyst aspiration     x50  . Abdominal hysterectomy 1990  . Lumbar fusion 2007    [facility]  .  Total hip arthroplasty 01/2007    Right - Dr Valentina Gu  . Inguinal hernia repair 2010    [facility]    Family History  Problem Relation Age of Onset  . Heart disease Father     CHF  . Lymphoma Sister   . Colon cancer Neg Hx     History  Substance Use Topics  . Smoking status: Never Smoker   . Smokeless tobacco: Never Used  . Alcohol Use: No    OB History    Grav Para Term Preterm Abortions TAB SAB Ect Mult Living                  Review of Systems  Allergies  Codeine  Home Medications   Current Outpatient Rx  Name  Route  Sig  Dispense  Refill  . ACETAMINOPHEN 500 MG PO TABS   Oral   Take 1,000 mg by mouth 2 (two) times daily. For hip pain         . VITAMIN D3 1000 UNITS PO CAPS   Oral   Take by mouth daily.         Marland Kitchen DIGOXIN 0.125 MG PO TABS   Oral   Take 0.125 mg by mouth daily.         Marland Kitchen DOXAZOSIN MESYLATE 4 MG PO TABS   Oral   Take 4 mg by mouth at bedtime.         . INTEGRA F 125-1 MG PO CAPS   Oral   Take 1 capsule by mouth daily.         Marland Kitchen  FEXOFENADINE HCL 180 MG PO TABS   Oral   Take 180 mg by mouth daily.         . FUROSEMIDE 40 MG PO TABS   Oral   Take 1.5 tablets (60 mg total) by mouth 2 (two) times daily.   90 tablet   6   . HYDROCODONE-ACETAMINOPHEN 5-325 MG PO TABS      TAKE ONE TABLET BY MOUTH EVERY 6 HOURS AS NEEDED   120 tablet   0   . INSULIN GLARGINE 100 UNIT/ML [place] SOLN   Subcutaneous   Inject 24 Units into the skin at bedtime.         . IRBESARTAN 300 MG PO TABS   Oral   Take 150 mg by mouth daily.          Marland Kitchen METFORMIN HCL 500 MG PO TABS   Oral   Take 500 mg by mouth 2 (two) times daily. Take 500 mg by mouth 2 (two) times daily with a meal.         . METOLAZONE 2.5 MG PO TABS      TAKE ONE TABLET BY MOUTH IN THE MORNING ON MONDAY, WEDNESDAY, AND FRIDAY 30 MINUTES BEFORE TAKING FUROSEMIDE   30 tablet   2   . POTASSIUM CHLORIDE CRYS ER 20 MEQ PO TBCR   Oral   Take 20 mEq by mouth 3 (three) times  daily.         Marland Kitchen PROMETHAZINE HCL 12.5 MG PO TABS      TAKE ONE TABLET BY MOUTH EVERY 6 HOURS AS NEEDED FOR NAUSEA   30 tablet   0   . SIMVASTATIN 20 MG PO TABS   Oral   Take 20 mg by mouth daily.          Marland Kitchen TEMAZEPAM 15 MG PO CAPS      TAKE ONE CAPSULE BY MOUTH AT BEDTIME   30 capsule   5   . GLUCOSE BLOOD VI STRP      Test 2 (two) times daily. DX 250.00   100 each   11   . METOCLOPRAMIDE HCL 5 MG PO TABS   Oral   Take 1 tablet (5 mg total) by mouth 2 (two) times daily.   60 tablet   11   . RELION MINI PEN NEEDLES 31G X 6 MM MISC      1 Container.           BP 148/45  Pulse 89  Temp 98 F (36.7 C) (Oral)  Resp 18  SpO2 95%  Physical Exam  Nursing note and vitals reviewed. Constitutional: She is oriented to person, place, and time. She appears well-developed and well-nourished. No distress.       Thin, kind elderly women  HENT:  Head: Normocephalic and atraumatic.  Eyes: Conjunctivae normal and EOM are normal.  Neck: Normal range of motion.  Cardiovascular: Intact distal pulses.        Irregularly irregular  Pulmonary/Chest: Effort normal.       LCAB  Abdominal:       Soft, non tender  Musculoskeletal: Normal range of motion.  Neurological: She is alert and oriented to person, place, and time.  Skin: Skin is warm and dry. No rash noted. She is not diaphoretic.  Psychiatric: She has a normal mood and affect. Her behavior is normal.    ED Course  Procedures (including critical care time)  Labs Reviewed  CBC - Abnormal; Notable for the  following:    RBC 2.47 (*)     Hemoglobin 7.8 (*)     HCT 24.8 (*)     MCV 100.4 (*)     All other components within normal limits  BASIC METABOLIC PANEL - Abnormal; Notable for the following:    Chloride 94 (*)     Glucose, Bld 355 (*)     BUN 55 (*)     GFR calc non Af Amer 53 (*)     GFR calc Af Amer 61 (*)     All other components within normal limits  TYPE AND SCREEN  URINALYSIS, ROUTINE W REFLEX  MICROSCOPIC   No results found.   No diagnosis found.  Date: 06/25/2012  Rate: 80  Rhythm: atrial fibrillation  QRS Axis: indeterminate  Intervals: normal  ST/T Wave abnormalities: normal  Conduction Disutrbances:none  Narrative Interpretation:   Old EKG Reviewed: changes noted, chronic a fib, not significant    MDM  Hyperglycemia, anemia  78yo F w hx of anemia of chronic def & DM presents to ER per recommendation of PCP for admission d/t low hgb & symptomatic anemia. Labs reviewed, hyperglycemia and hgb of 7.8, which pt and family state that she typically receives 3 U PRBCs for. Chart reviewed and last transfusion in August when hgb was 6.4, however pt has needed to receive Feraheme infusions. PCP Dr. Debby Bud, hematologist Dr. Shirline Frees. Call made by Dr. Posey Rea- covering physician.Pt currently has no complaints and is in NAD. BP 148/45  Pulse 89  Temp 98 F (36.7 C) (Oral)  Resp 18  SpO2 95%. Disposition pending internal medicine consult.   Consult: Hospitalist, It was felt that overnight observation and re-check hgb in the morning will not be necessary d/t the chronic state of pts anemia & the fact the transfusion is not currently indicated.  Recommendation follow up w pcp for hgb re-check Monday morning.         Jaci Carrel, PA-C 06/25/12 [address], PA-C 07/13/12 [phone]

## 2012-06-27 LAB — GLUCOSE, CAPILLARY

## 2012-06-28 ENCOUNTER — Telehealth: Payer: Self-pay | Admitting: Internal Medicine

## 2012-06-28 NOTE — Telephone Encounter (Signed)
ED note reviewed - she was not kept for transfusion due to chronic recurrent anemia and without symptoms.  For leg cramps - can try GinkoBiloba if not already tried.  Can add on to my schedule for tomorrow - Weds.

## 2012-06-28 NOTE — Telephone Encounter (Signed)
Left message on machine for pt's son to return my call.  Please contact pt's son to schedule appt per MEN

## 2012-06-28 NOTE — Telephone Encounter (Signed)
Caller: Greg/Patient; Phone: [phone]; Reason for Call: Pt had blood work last week that showed her hgb was 7.  9.  She was advised to go to ED for transfusion on 06/24/12.  Son took her to [facility] where they refused to do that.  Pt has an upcoming appt on Thursday at 3: 30.  Pt is having leg cramps more consistently.  He wants to know what else they can do for that and wants to be put on a waiting list if an opening for Dr.  Debby Bud becomes available before Thursday.

## 2012-06-30 ENCOUNTER — Encounter: Payer: Self-pay | Admitting: Internal Medicine

## 2012-06-30 ENCOUNTER — Ambulatory Visit (INDEPENDENT_AMBULATORY_CARE_PROVIDER_SITE_OTHER): Payer: Medicare Other | Admitting: Internal Medicine

## 2012-06-30 VITALS — BP 122/58 | HR 94 | Temp 97.4°F | Resp 10

## 2012-06-30 DIAGNOSIS — I509 Heart failure, unspecified: Secondary | ICD-10-CM

## 2012-06-30 DIAGNOSIS — E119 Type 2 diabetes mellitus without complications: Secondary | ICD-10-CM

## 2012-06-30 DIAGNOSIS — D649 Anemia, unspecified: Secondary | ICD-10-CM

## 2012-06-30 MED ORDER — METFORMIN HCL 500 MG PO TABS: 500.0000 mg | ORAL_TABLET | Freq: Two times a day (BID) | ORAL | Status: DC

## 2012-06-30 NOTE — Patient Instructions (Addendum)
1. Anemia - you seem to be doing well. The Hgb has been 8.4, 7.9, 7.8 and for you this is ok. Our guide will be your symptoms. If you feel bad, short of breath, have chest pain or excessive fatigue we check the Hgb and decide based on the number and your symptoms about transfusion.  2. Diabetes - we can tolerate fairly high blood sugars. Up to 400 is not a problem as long as you are not feeling bad. Of course lower is better and having a fasting morning sugar of 200 or less is great. Also, if the blood sugar jumps up suddenly we need to be looking for any signs of infection.   Plan Continue your present dose of insulin and a reasonably no sugar diet.  3. Leg cramps - diet tonic water - 8 to 16 oz per day can be very helpful due to the quinine in the tonic water. Also Ginko Biloba, an herbal, can also help.  Have a great Thanksgiving.

## 2012-07-03 MED ORDER — INSULIN GLARGINE 100 UNIT/ML ~~LOC~~ SOLN: 30.0000 [IU] | Freq: Every day | SUBCUTANEOUS | Status: DC

## 2012-07-03 NOTE — Assessment & Plan Note (Signed)
Lab Results  Component Value Date   HGBA1C 7.0* 06/24/2012   Plan-  Continue present regimen.   She and her son understand out strategy of avoiding hypoglcyemia

## 2012-07-03 NOTE — Progress Notes (Signed)
Subjective:    Patient ID: [name], female    DOB: 02/19/1920, 77 y.o.   MRN: [id number]  HPI Ms. Feggins presents, along with her son, for follow up of hyperglycemia and anemia. At home she was noted to have a CBG of 411 although she was asymptomatic. Labs were ordered which also revealed a Hgb of 7.9. Ms. Offutt was referred to [facility] ED 06/25/12. Notes and labs reviewed. She did have a confirmed Hgb 7.8 but the ED-P found her to be stable w/o chest pain or shortness of breath and did not recommend transfusion and sent her home.   Ms. Louthan with known MDS followed by Dr. Arbutus Ped receives aranesp as needed. She is chronically anemic and has been admitted and transfused in the past on multiple occasions when she is symptomatic. At today's visit she is also w/o symptoms.  Ms. Barga is treated with basal insulin and elevated CBGs have been tolerated with an eye to avoiding hypoglycemic episodes. She has been able to tolerate CBG excursions well.  Lab Results  Component Value Date   HGBA1C 7.0* 06/24/2012   PMH, FamHx and SocHx reviewed for any changes and relevance.  Current Outpatient Prescriptions on File Prior to Visit  Medication Sig Dispense Refill  . acetaminophen (TYLENOL) 500 MG tablet Take 1,000 mg by mouth 2 (two) times daily. For hip pain      . Cholecalciferol (VITAMIN D3) 1000 UNITS CAPS Take by mouth daily.      . digoxin (LANOXIN) 0.125 MG tablet Take 0.125 mg by mouth daily.      Marland Kitchen doxazosin (CARDURA) 4 MG tablet Take 4 mg by mouth at bedtime.      . Fe Fum-FePoly-FA-Vit C-Vit B3 (INTEGRA F) 125-1 MG CAPS Take 1 capsule by mouth daily.      . fexofenadine (ALLEGRA) 180 MG tablet Take 180 mg by mouth daily.      . furosemide (LASIX) 40 MG tablet Take 1.5 tablets (60 mg total) by mouth 2 (two) times daily.  90 tablet  6  . glucose blood test strip Test 2 (two) times daily. DX 250.00  100 each  11  . HYDROcodone-acetaminophen (NORCO/VICODIN) 5-325 MG per tablet TAKE ONE  TABLET BY MOUTH EVERY 6 HOURS AS NEEDED  120 tablet  0  . insulin glargine (LANTUS SOLOSTAR) 100 UNIT/ML injection Inject 24 Units into the skin at bedtime.      . irbesartan (AVAPRO) 300 MG tablet Take 150 mg by mouth daily.       . metoCLOPramide (REGLAN) 5 MG tablet Take 5 mg by mouth 2 (two) times daily.      . metolazone (ZAROXOLYN) 2.5 MG tablet TAKE ONE TABLET BY MOUTH IN THE MORNING ON MONDAY, WEDNESDAY, AND FRIDAY 30 MINUTES BEFORE TAKING FUROSEMIDE  30 tablet  2  . potassium chloride SA (K-DUR,KLOR-CON) 20 MEQ tablet Take 20 mEq by mouth 3 (three) times daily.      . promethazine (PHENERGAN) 12.5 MG tablet TAKE ONE TABLET BY MOUTH EVERY 6 HOURS AS NEEDED FOR NAUSEA  30 tablet  0  . RELION MINI PEN NEEDLES 31G X 6 MM MISC 1 Container.      . simvastatin (ZOCOR) 20 MG tablet Take 20 mg by mouth daily.       . temazepam (RESTORIL) 15 MG capsule TAKE ONE CAPSULE BY MOUTH AT BEDTIME  30 capsule  5      Review of Systems  Constitutional: Negative for activity change, fatigue and unexpected  weight change.  HENT: Negative.   Eyes: Negative.   Respiratory: Negative for chest tightness and shortness of breath.   Cardiovascular: Negative for chest pain and palpitations.  Gastrointestinal: Negative.   Genitourinary: Negative.   Musculoskeletal: Negative.   Neurological: Negative for dizziness, weakness and light-headedness.  Hematological: Bruises/bleeds easily.  Psychiatric/Behavioral: Negative.        Objective:   Physical Exam Filed Vitals:   06/30/12 1548  BP: 122/58  Pulse: 94  Temp: 97.4 F (36.3 C)  Resp: 10   Gen'l- elderly white woman - she has her wig and make-up on and is in a good frame of mind. HEENT- PERRLA Cor- RRR Pulm - normal respirations Neuro - A&O x 3, conversant.       Assessment & Plan:  Leg cramps - K was normal  Plan First step: tonic water 8-16 oz per day.  Ginko Biloba

## 2012-07-03 NOTE — Assessment & Plan Note (Signed)
Patient tolerating Hgb 7.8 without symptoms.  Plan  Follow up as planned with Dr. Arbutus Ped  For any symptoms, e.g. Chest pain, weakness, etc, repeat Hgb and appropriate action.

## 2012-07-03 NOTE — Assessment & Plan Note (Signed)
Well compensated - no evidence of acute CHF

## 2012-07-10 ENCOUNTER — Other Ambulatory Visit: Payer: Self-pay | Admitting: Internal Medicine

## 2012-07-11 ENCOUNTER — Other Ambulatory Visit (INDEPENDENT_AMBULATORY_CARE_PROVIDER_SITE_OTHER): Payer: Medicare Other

## 2012-07-11 ENCOUNTER — Telehealth: Payer: Self-pay

## 2012-07-11 DIAGNOSIS — D649 Anemia, unspecified: Secondary | ICD-10-CM

## 2012-07-11 LAB — HEMATOCRIT: HCT: 25.7 % — ABNORMAL LOW (ref 36.0–46.0)

## 2012-07-11 NOTE — Telephone Encounter (Signed)
Pt's son called requesting order for pt to have hemoglobin checked. Pt has been c/o weakness, body pain,  and says she just feels "terrible". Okay to order?

## 2012-07-11 NOTE — Telephone Encounter (Signed)
Order for HH placed.  

## 2012-07-11 NOTE — Telephone Encounter (Signed)
Pt's son informed and will bring pt in for lab draw today.

## 2012-07-12 ENCOUNTER — Telehealth: Payer: Self-pay | Admitting: *Deleted

## 2012-07-12 NOTE — Telephone Encounter (Signed)
Called pt's son and told him that her Hgb is improved - up to 8.4l, no indication for transfusion. Tammy Sours, son, was pleased and thankful.  Marland Kitchen

## 2012-07-13 NOTE — ED Provider Notes (Signed)
See prior note   Ward Givens, MD 07/13/12 1614

## 2012-07-18 ENCOUNTER — Other Ambulatory Visit: Payer: Self-pay | Admitting: Internal Medicine

## 2012-07-21 ENCOUNTER — Other Ambulatory Visit: Payer: Self-pay | Admitting: Orthopedic Surgery

## 2012-07-21 DIAGNOSIS — M25552 Pain in left hip: Secondary | ICD-10-CM

## 2012-07-21 DIAGNOSIS — M1610 Unilateral primary osteoarthritis, unspecified hip: Secondary | ICD-10-CM

## 2012-07-25 ENCOUNTER — Ambulatory Visit (HOSPITAL_BASED_OUTPATIENT_CLINIC_OR_DEPARTMENT_OTHER): Payer: Medicare Other | Admitting: Internal Medicine

## 2012-07-25 ENCOUNTER — Other Ambulatory Visit: Payer: Medicare Other | Admitting: Lab

## 2012-07-25 VITALS — BP 129/73 | HR 90 | Temp 97.7°F | Resp 18 | Ht 64.0 in | Wt 129.3 lb

## 2012-07-25 DIAGNOSIS — D509 Iron deficiency anemia, unspecified: Secondary | ICD-10-CM

## 2012-07-25 DIAGNOSIS — D649 Anemia, unspecified: Secondary | ICD-10-CM

## 2012-07-25 LAB — CBC WITH DIFFERENTIAL/PLATELET
BASO%: 0.2 % (ref 0.0–2.0)
EOS%: 1.9 % (ref 0.0–7.0)
HCT: 25.2 % — ABNORMAL LOW (ref 34.8–46.6)
LYMPH%: 15.5 % (ref 14.0–49.7)
MCH: 31.1 pg (ref 25.1–34.0)
MCHC: 32.5 g/dL (ref 31.5–36.0)
MCV: 95.8 fL (ref 79.5–101.0)
MONO#: 0.5 10*3/uL (ref 0.1–0.9)
NEUT%: 74.4 % (ref 38.4–76.8)
Platelets: 175 10*3/uL (ref 145–400)

## 2012-07-25 LAB — FERRITIN: Ferritin: 20 ng/mL (ref 10–291)

## 2012-07-25 LAB — IRON AND TIBC: Iron: 19 ug/dL — ABNORMAL LOW (ref 42–145)

## 2012-07-25 NOTE — Progress Notes (Signed)
[facility] Telephone:([phone]) [phone]   Fax:([phone]) [phone]  OFFICE PROGRESS NOTE  Illene Regulus, MD [address] [place] [address]  PRINCIPAL DIAGNOSIS: Anemia of chronic disease plus iron deficiency.   PRIOR THERAPY: Status post Feraheme infusion x6 doses; last dose was given on March 11, 2011.   CURRENT THERAPY: Integra Plus 1 capsule p.o. daily.  INTERVAL HISTORY: [name] 78 y.o. female returns to the clinic today for followup visit accompanied her daughter. The patient is feeling fine today except for fatigue. She continues to have shortness breath with exertion. The patient denied having any other significant complaints. She denied having any weight loss or night sweats. She is tolerating her current treatment with Integra plus fairly well. She has repeat CBC and iron study performed earlier today and she is here for evaluation and discussion of her lab results.  MEDICAL HISTORY: Past Medical History  Diagnosis Date  . Inguinal hernia   . Mitral valve insufficiency and aortic valve insufficiency   . Other and unspecified hyperlipidemia   . Pneumonia   . Osteoarthrosis, unspecified whether generalized or localized, unspecified site   . Nephrolithiasis   . Unspecified essential hypertension   . Type II or unspecified type diabetes mellitus without mention of complication, not stated as uncontrolled   . Coronary atherosclerosis of unspecified type of vessel, native or graft   . Congestive heart failure, unspecified   . Atrial fibrillation   . Unspecified asthma   . Anemia, unspecified   . Allergic rhinitis, cause unspecified   . Colon polyps 10/2009    ALLERGIES:  is allergic to codeine.  MEDICATIONS:  Current Outpatient Prescriptions  Medication Sig Dispense Refill  . acetaminophen (TYLENOL) 500 MG tablet Take 1,000 mg by mouth 2 (two) times daily. For hip pain      . Cholecalciferol (VITAMIN D3) 1000 UNITS CAPS Take by mouth daily.       . digoxin (LANOXIN) 0.125 MG tablet Take 0.125 mg by mouth daily.      Marland Kitchen doxazosin (CARDURA) 4 MG tablet Take 4 mg by mouth at bedtime.      . Fe Fum-FePoly-FA-Vit C-Vit B3 (INTEGRA F) 125-1 MG CAPS Take 1 capsule by mouth daily.      Marland Kitchen FeFum-FePoly-FA-B Cmp-C-Biot (INTEGRA PLUS) CAPS TAKE ONE CAPSULE BY MOUTH EVERY DAY.  30 each  1  . fexofenadine (ALLEGRA) 180 MG tablet Take 180 mg by mouth daily.      . furosemide (LASIX) 40 MG tablet Take 1.5 tablets (60 mg total) by mouth 2 (two) times daily.  90 tablet  6  . glucose blood test strip Test 2 (two) times daily. DX 250.00  100 each  11  . HYDROcodone-acetaminophen (NORCO/VICODIN) 5-325 MG per tablet TAKE ONE TABLET BY MOUTH EVERY 6 HOURS AS NEEDED  120 tablet  0  . insulin glargine (LANTUS SOLOSTAR) 100 UNIT/ML injection Inject 30 Units into the skin at bedtime.  10 mL    . irbesartan (AVAPRO) 300 MG tablet Take 150 mg by mouth daily.       Marland Kitchen KLOR-CON M20 20 MEQ tablet TAKE ONE TABLET BY MOUTH THREE TIMES DAILY  90 tablet  3  . metFORMIN (GLUCOPHAGE) 500 MG tablet Take 1 tablet (500 mg total) by mouth 2 (two) times daily. Take 500 mg by mouth 2 (two) times daily with a meal.  60 tablet  11  . metoCLOPramide (REGLAN) 5 MG tablet Take 5 mg by mouth  2 (two) times daily.      . metolazone (ZAROXOLYN) 2.5 MG tablet TAKE ONE TABLET BY MOUTH IN THE MORNING ON MONDAY, WEDNESDAY, AND FRIDAY 30 MINUTES BEFORE TAKING FUROSEMIDE  30 tablet  2  . potassium chloride SA (K-DUR,KLOR-CON) 20 MEQ tablet Take 20 mEq by mouth 3 (three) times daily.      . promethazine (PHENERGAN) 12.5 MG tablet TAKE ONE TABLET BY MOUTH EVERY 6 HOURS AS NEEDED FOR NAUSEA  30 tablet  0  . RELION MINI PEN NEEDLES 31G X 6 MM MISC 1 Container.      . simvastatin (ZOCOR) 20 MG tablet Take 20 mg by mouth daily.       . temazepam (RESTORIL) 15 MG capsule TAKE ONE CAPSULE BY MOUTH AT BEDTIME  30 capsule  5    SURGICAL HISTORY:  Past Surgical History  Procedure Date  . Breast cyst  aspiration     x50  . Abdominal hysterectomy 1990  . Lumbar fusion 2007    [facility]  . Total hip arthroplasty 01/2007    Right - Dr Valentina Gu  . Inguinal hernia repair 2010    [facility]    REVIEW OF SYSTEMS:  A comprehensive review of systems was negative except for: Constitutional: positive for fatigue   PHYSICAL EXAMINATION: General appearance: alert, cooperative and no distress Head: Normocephalic, without obvious abnormality, atraumatic Neck: no adenopathy Resp: clear to auscultation bilaterally Cardio: regular rate and rhythm, S1, S2 normal, no murmur, click, rub or gallop GI: soft, non-tender; bowel sounds normal; no masses,  no organomegaly Extremities: extremities normal, atraumatic, no cyanosis or edema  ECOG PERFORMANCE STATUS: 2 - Symptomatic, <50% confined to bed  Blood pressure 129/73, pulse 90, temperature 97.7 F (36.5 C), temperature source Oral, resp. rate 18, height 5\' 4"  (1.626 m), weight 129 lb 4.8 oz (58.65 kg).  LABORATORY DATA: Lab Results  Component Value Date   WBC 6.3 07/25/2012   HGB 8.2* 07/25/2012   HCT 25.2* 07/25/2012   MCV 95.8 07/25/2012   PLT 175 07/25/2012      Chemistry      Component Value Date/Time   NA 135 06/25/2012 1133   K 4.0 06/25/2012 1133   CL 94* 06/25/2012 1133   CO2 28 06/25/2012 1133   BUN 55* 06/25/2012 1133   CREATININE 0.93 06/25/2012 1133      Component Value Date/Time   CALCIUM 10.1 06/25/2012 1133   ALKPHOS 84 06/24/2012 1617   AST 23 06/24/2012 1617   ALT 25 06/24/2012 1617   BILITOT 0.7 06/24/2012 1617       RADIOGRAPHIC STUDIES: No results found.  ASSESSMENT: This is a very pleasant 78 years old white female with history of anemia of chronic disease plus/minus deficiency. She is currently on treatment with Integra plus and tolerating it fairly well. Her hemoglobin and hematocrit are low but stable.  PLAN: I discussed the lab result with the patient and her daughter today. I again discussed with them  consideration of treatment with Aranesp 300 mcg subcutaneously every 3 weeks for the anemia of chronic disease. The patient has been reluctant on considering this treatment because of concern about the adverse effect especially the risk of stroke or cardiac disease.  I advised her to continue on Integra plus for now. I would see her back for followup visit in 3 months with repeat CBC and iron study. She was advised to call me if she changes her mind regarding the treatment with Aranesp.  All questions were  answered. The patient knows to call the clinic with any problems, questions or concerns. We can certainly see the patient much sooner if necessary.

## 2012-07-25 NOTE — Patient Instructions (Signed)
Your hemoglobin and hematocrit are low but stable. Continue on Integra plus for now. Followup in 3 months

## 2012-07-26 ENCOUNTER — Telehealth: Payer: Self-pay | Admitting: Internal Medicine

## 2012-07-26 NOTE — Telephone Encounter (Signed)
S/w pt's son Tammy Sours re appts for 10/24/12 and 10/31/12. Schedule mailed.

## 2012-07-27 ENCOUNTER — Other Ambulatory Visit: Payer: Medicare Other

## 2012-07-27 ENCOUNTER — Ambulatory Visit
Admission: RE | Admit: 2012-07-27 | Discharge: 2012-07-27 | Disposition: A | Discharge status: Home / Self Care | Payer: Medicare Other | Source: Ambulatory Visit | Attending: Orthopedic Surgery | Admitting: Orthopedic Surgery

## 2012-07-27 DIAGNOSIS — M25552 Pain in left hip: Secondary | ICD-10-CM

## 2012-07-27 DIAGNOSIS — M1610 Unilateral primary osteoarthritis, unspecified hip: Secondary | ICD-10-CM

## 2012-07-27 MED ORDER — IOHEXOL 180 MG/ML  SOLN
1.0000 mL | Freq: Once | INTRAMUSCULAR | Status: AC | PRN
  Administered 2012-07-27: 1 mL via INTRA_ARTICULAR

## 2012-08-15 ENCOUNTER — Inpatient Hospital Stay (HOSPITAL_COMMUNITY)
Admission: EM | Admit: 2012-08-15 | Discharge: 2012-08-18 | DRG: 641 | Disposition: A | Discharge status: Skilled Nursing Facility | Payer: Medicare Other | Attending: Internal Medicine | Admitting: Internal Medicine

## 2012-08-15 ENCOUNTER — Encounter (HOSPITAL_COMMUNITY): Payer: Self-pay | Admitting: *Deleted

## 2012-08-15 ENCOUNTER — Emergency Department (HOSPITAL_COMMUNITY): Payer: Medicare Other

## 2012-08-15 ENCOUNTER — Other Ambulatory Visit: Payer: Self-pay

## 2012-08-15 DIAGNOSIS — J45909 Unspecified asthma, uncomplicated: Secondary | ICD-10-CM | POA: Diagnosis present

## 2012-08-15 DIAGNOSIS — R5381 Other malaise: Secondary | ICD-10-CM | POA: Diagnosis present

## 2012-08-15 DIAGNOSIS — D469 Myelodysplastic syndrome, unspecified: Secondary | ICD-10-CM | POA: Diagnosis present

## 2012-08-15 DIAGNOSIS — D649 Anemia, unspecified: Secondary | ICD-10-CM

## 2012-08-15 DIAGNOSIS — D638 Anemia in other chronic diseases classified elsewhere: Secondary | ICD-10-CM | POA: Diagnosis present

## 2012-08-15 DIAGNOSIS — Z79899 Other long term (current) drug therapy: Secondary | ICD-10-CM

## 2012-08-15 DIAGNOSIS — E119 Type 2 diabetes mellitus without complications: Secondary | ICD-10-CM | POA: Diagnosis present

## 2012-08-15 DIAGNOSIS — E871 Hypo-osmolality and hyponatremia: Principal | ICD-10-CM | POA: Diagnosis present

## 2012-08-15 DIAGNOSIS — I2510 Atherosclerotic heart disease of native coronary artery without angina pectoris: Secondary | ICD-10-CM | POA: Diagnosis present

## 2012-08-15 DIAGNOSIS — I509 Heart failure, unspecified: Secondary | ICD-10-CM | POA: Diagnosis present

## 2012-08-15 DIAGNOSIS — Z794 Long term (current) use of insulin: Secondary | ICD-10-CM

## 2012-08-15 DIAGNOSIS — I4891 Unspecified atrial fibrillation: Secondary | ICD-10-CM | POA: Diagnosis present

## 2012-08-15 DIAGNOSIS — E785 Hyperlipidemia, unspecified: Secondary | ICD-10-CM | POA: Diagnosis present

## 2012-08-15 DIAGNOSIS — E860 Dehydration: Secondary | ICD-10-CM | POA: Diagnosis present

## 2012-08-15 DIAGNOSIS — I10 Essential (primary) hypertension: Secondary | ICD-10-CM | POA: Diagnosis present

## 2012-08-15 DIAGNOSIS — R413 Other amnesia: Secondary | ICD-10-CM

## 2012-08-15 DIAGNOSIS — Z7401 Bed confinement status: Secondary | ICD-10-CM

## 2012-08-15 DIAGNOSIS — I080 Rheumatic disorders of both mitral and aortic valves: Secondary | ICD-10-CM | POA: Diagnosis present

## 2012-08-15 DIAGNOSIS — I5032 Chronic diastolic (congestive) heart failure: Secondary | ICD-10-CM | POA: Diagnosis present

## 2012-08-15 LAB — GLUCOSE, CAPILLARY: Glucose-Capillary: 275 mg/dL — ABNORMAL HIGH (ref 70–99)

## 2012-08-15 LAB — URINALYSIS, ROUTINE W REFLEX MICROSCOPIC
Bilirubin Urine: NEGATIVE
Glucose, UA: 500 mg/dL — AB
Ketones, ur: NEGATIVE mg/dL
Protein, ur: NEGATIVE mg/dL

## 2012-08-15 LAB — COMPREHENSIVE METABOLIC PANEL
AST: 20 U/L (ref 0–37)
CO2: 27 mEq/L (ref 19–32)
Calcium: 10.2 mg/dL (ref 8.4–10.5)
Creatinine, Ser: 0.95 mg/dL (ref 0.50–1.10)
GFR calc Af Amer: 59 mL/min — ABNORMAL LOW (ref >90)
GFR calc non Af Amer: 51 mL/min — ABNORMAL LOW (ref >90)
Sodium: 131 mEq/L — ABNORMAL LOW (ref 135–145)
Total Protein: 7.1 g/dL (ref 6.0–8.3)

## 2012-08-15 LAB — CBC WITH DIFFERENTIAL/PLATELET
Basophils Absolute: 0 10*3/uL (ref 0.0–0.1)
Basophils Relative: 0 % (ref 0–1)
Eosinophils Absolute: 0.1 10*3/uL (ref 0.0–0.7)
Eosinophils Relative: 2 % (ref 0–5)
HCT: 26.5 % — ABNORMAL LOW (ref 36.0–46.0)
Lymphocytes Relative: 17 % (ref 12–46)
MCH: 29.3 pg (ref 26.0–34.0)
MCHC: 31.3 g/dL (ref 30.0–36.0)
MCV: 93.6 fL (ref 78.0–100.0)
Monocytes Absolute: 0.6 10*3/uL (ref 0.1–1.0)
Platelets: 196 10*3/uL (ref 150–400)
RDW: 14.9 % (ref 11.5–15.5)

## 2012-08-15 LAB — PRO B NATRIURETIC PEPTIDE: Pro B Natriuretic peptide (BNP): 306.6 pg/mL (ref 0–450)

## 2012-08-15 MED ORDER — INSULIN ASPART 100 UNIT/ML ~~LOC~~ SOLN
0.0000 [IU] | Freq: Three times a day (TID) | SUBCUTANEOUS | Status: DC
  Administered 2012-08-16: 7 [IU] via SUBCUTANEOUS
  Administered 2012-08-16: 1 [IU] via SUBCUTANEOUS
  Administered 2012-08-16: 5 [IU] via SUBCUTANEOUS
  Administered 2012-08-17: 7 [IU] via SUBCUTANEOUS
  Administered 2012-08-17: 5 [IU] via SUBCUTANEOUS
  Administered 2012-08-18: 3 [IU] via SUBCUTANEOUS
  Administered 2012-08-18: 2 [IU] via SUBCUTANEOUS

## 2012-08-15 MED ORDER — CLONAZEPAM 0.5 MG PO TABS
0.5000 mg | ORAL_TABLET | Freq: Two times a day (BID) | ORAL | Status: DC | PRN
  Administered 2012-08-16 – 2012-08-17 (×2): 0.5 mg via ORAL
  Filled 2012-08-15 (×2): qty 1

## 2012-08-15 MED ORDER — SODIUM CHLORIDE 0.9 % IV SOLN
INTRAVENOUS | Status: DC
  Administered 2012-08-15: 19:00:00 via INTRAVENOUS
  Administered 2012-08-16: 50 mL via INTRAVENOUS
  Administered 2012-08-17: 18:00:00 via INTRAVENOUS

## 2012-08-15 MED ORDER — SODIUM CHLORIDE 0.9 % IV SOLN
INTRAVENOUS | Status: DC
  Administered 2012-08-15: 17:00:00 via INTRAVENOUS

## 2012-08-15 MED ORDER — TEMAZEPAM 15 MG PO CAPS
15.0000 mg | ORAL_CAPSULE | Freq: Every evening | ORAL | Status: DC | PRN
  Administered 2012-08-15 – 2012-08-17 (×2): 15 mg via ORAL
  Filled 2012-08-15 (×2): qty 1

## 2012-08-15 MED ORDER — DIGOXIN 125 MCG PO TABS
0.1250 mg | ORAL_TABLET | Freq: Every day | ORAL | Status: DC
  Administered 2012-08-16 – 2012-08-18 (×3): 0.125 mg via ORAL
  Filled 2012-08-15 (×3): qty 1

## 2012-08-15 MED ORDER — DOXAZOSIN MESYLATE 4 MG PO TABS
4.0000 mg | ORAL_TABLET | Freq: Every day | ORAL | Status: DC
  Administered 2012-08-15 – 2012-08-17 (×3): 4 mg via ORAL
  Filled 2012-08-15 (×4): qty 1

## 2012-08-15 MED ORDER — ONDANSETRON HCL 4 MG/2ML IJ SOLN
4.0000 mg | Freq: Once | INTRAMUSCULAR | Status: AC
  Administered 2012-08-15: 4 mg via INTRAVENOUS
  Filled 2012-08-15: qty 2

## 2012-08-15 MED ORDER — ONDANSETRON HCL 4 MG/2ML IJ SOLN
4.0000 mg | Freq: Four times a day (QID) | INTRAMUSCULAR | Status: DC | PRN
  Administered 2012-08-16: 4 mg via INTRAVENOUS
  Filled 2012-08-15: qty 2

## 2012-08-15 MED ORDER — LORATADINE 10 MG PO TABS
10.0000 mg | ORAL_TABLET | Freq: Every day | ORAL | Status: DC
  Administered 2012-08-16 – 2012-08-18 (×3): 10 mg via ORAL
  Filled 2012-08-15 (×3): qty 1

## 2012-08-15 MED ORDER — CEFTRIAXONE SODIUM 1 G IJ SOLR
1.0000 g | INTRAMUSCULAR | Status: DC
  Administered 2012-08-15 – 2012-08-17 (×3): 1 g via INTRAVENOUS
  Filled 2012-08-15 (×4): qty 10

## 2012-08-15 MED ORDER — HYDROCODONE-ACETAMINOPHEN 5-325 MG PO TABS
1.0000 | ORAL_TABLET | ORAL | Status: DC | PRN
  Administered 2012-08-15 – 2012-08-18 (×11): 1 via ORAL
  Filled 2012-08-15 (×11): qty 1

## 2012-08-15 MED ORDER — ONDANSETRON HCL 4 MG PO TABS
4.0000 mg | ORAL_TABLET | Freq: Four times a day (QID) | ORAL | Status: DC | PRN
  Administered 2012-08-17 – 2012-08-18 (×2): 4 mg via ORAL
  Filled 2012-08-15 (×2): qty 1

## 2012-08-15 MED ORDER — IRBESARTAN 150 MG PO TABS
150.0000 mg | ORAL_TABLET | Freq: Every day | ORAL | Status: DC
Start: 2012-08-16 — End: 2012-08-18
  Administered 2012-08-16 – 2012-08-18 (×3): 150 mg via ORAL
  Filled 2012-08-15 (×3): qty 1

## 2012-08-15 MED ORDER — METOCLOPRAMIDE HCL 5 MG PO TABS
5.0000 mg | ORAL_TABLET | Freq: Two times a day (BID) | ORAL | Status: DC
  Administered 2012-08-15 – 2012-08-18 (×6): 5 mg via ORAL
  Filled 2012-08-15 (×8): qty 1

## 2012-08-15 MED ORDER — SODIUM CHLORIDE 0.9 % IV SOLN
Freq: Once | INTRAVENOUS | Status: AC
  Administered 2012-08-15: 10 mL/h via INTRAVENOUS

## 2012-08-15 MED ORDER — SODIUM CHLORIDE 0.9 % IV BOLUS (SEPSIS)
500.0000 mL | Freq: Once | INTRAVENOUS | Status: AC
  Administered 2012-08-15: 500 mL via INTRAVENOUS

## 2012-08-15 MED ORDER — INSULIN ASPART 100 UNIT/ML ~~LOC~~ SOLN
10.0000 [IU] | Freq: Once | SUBCUTANEOUS | Status: AC
  Administered 2012-08-15: 10 [IU] via SUBCUTANEOUS
  Filled 2012-08-15: qty 10

## 2012-08-15 MED ORDER — INSULIN GLARGINE 100 UNIT/ML ~~LOC~~ SOLN
34.0000 [IU] | Freq: Every day | SUBCUTANEOUS | Status: DC
  Administered 2012-08-15 – 2012-08-17 (×3): 34 [IU] via SUBCUTANEOUS

## 2012-08-15 NOTE — H&P (Addendum)
PCP:   Illene Regulus, MD   Chief Complaint:  Weakness  HPI: 79 year old female, with a history of congestive heart failure, A. fib, hypertension, diabetes mellitus came to the hospital is worsening weakness and poor by mouth intake for the past one week. As per the family patient's son and daughter who are bedside, patient has declined over the past 6 months and has been bedbound for past one week. She had very poor by mouth intake. Was getting fatigued easily. Though they denied fever, chest pain, positive shortness of breath. There was no runny nose or sore throat.  Allergies:   Allergies  Allergen Reactions  . Codeine     REACTION: Hives      Past Medical History  Diagnosis Date  . Inguinal hernia   . Mitral valve insufficiency and aortic valve insufficiency   . Other and unspecified hyperlipidemia   . Pneumonia   . Osteoarthrosis, unspecified whether generalized or localized, unspecified site   . Nephrolithiasis   . Unspecified essential hypertension   . Type II or unspecified type diabetes mellitus without mention of complication, not stated as uncontrolled   . Coronary atherosclerosis of unspecified type of vessel, native or graft   . Congestive heart failure, unspecified   . Atrial fibrillation   . Unspecified asthma   . Anemia, unspecified   . Allergic rhinitis, cause unspecified   . Colon polyps 10/2009    Past Surgical History  Procedure Date  . Breast cyst aspiration     x50  . Abdominal hysterectomy 1990  . Lumbar fusion 2007    [facility]  . Total hip arthroplasty 01/2007    Right - Dr Valentina Gu  . Inguinal hernia repair 2010    [facility]osenbower    Prior to Admission medications   Medication Sig Start Date End Date Taking? Authorizing Provider  acetaminophen (TYLENOL) 500 MG tablet Take 1,000 mg by mouth 2 (two) times daily. For hip pain   Yes Historical Provider, MD  Cholecalciferol (VITAMIN D3) 1000 UNITS CAPS Take by mouth daily.   Yes Historical Provider, MD    clonazePAM (KLONOPIN) 0.5 MG tablet Take 0.5 mg by mouth 2 (two) times daily as needed. For anxiety   Yes Historical Provider, MD  digoxin (LANOXIN) 0.125 MG tablet Take 0.125 mg by mouth daily.   Yes Historical Provider, MD  doxazosin (CARDURA) 4 MG tablet Take 4 mg by mouth at bedtime.   Yes Historical Provider, MD  FeFum-FePoly-FA-B Cmp-C-Biot (INTEGRA PLUS) CAPS TAKE ONE CAPSULE BY MOUTH EVERY DAY. 07/18/12  Yes Si Gaul, MD  ferrous sulfate 325 (65 FE) MG tablet Take 325 mg by mouth daily with breakfast.   Yes Historical Provider, MD  fexofenadine (ALLEGRA) 180 MG tablet Take 180 mg by mouth daily.   Yes Historical Provider, MD  furosemide (LASIX) 40 MG tablet Take 1.5 tablets (60 mg total) by mouth 2 (two) times daily. 06/21/12  Yes Beatrice Lecher, PA  HYDROcodone-acetaminophen (NORCO/VICODIN) 5-325 MG per tablet  06/06/12  Yes Jacques Navy, MD  insulin glargine (LANTUS) 100 UNIT/ML injection Inject 34 Units into the skin at bedtime.  07/03/12  Yes Jacques Navy, MD  irbesartan (AVAPRO) 300 MG tablet Take 150 mg by mouth daily.    Yes Historical Provider, MD  metFORMIN (GLUCOPHAGE) 500 MG tablet Take 1 tablet (500 mg total) by mouth 2 (two) times daily. Take 500 mg by mouth 2 (two) times daily with a meal. 06/30/12  Yes Jacques Navy, MD  metoCLOPramide (  REGLAN) 5 MG tablet Take 5 mg by mouth 2 (two) times daily. 12/17/11  Yes Jacques Navy, MD  metolazone (ZAROXOLYN) 2.5 MG tablet TAKE ONE TABLET BY MOUTH IN THE MORNING ON MONDAY, WEDNESDAY, AND FRIDAY 30 MINUTES BEFORE TAKING FUROSEMIDE 06/06/12  Yes Jacques Navy, MD  potassium chloride SA (K-DUR,KLOR-CON) 20 MEQ tablet Take 20 mEq by mouth 3 (three) times daily.   Yes Historical Provider, MD  promethazine (PHENERGAN) 12.5 MG tablet TAKE ONE TABLET BY MOUTH EVERY 6 HOURS AS NEEDED FOR NAUSEA 06/06/12  Yes Jacques Navy, MD  RELION MINI PEN NEEDLES 31G X 6 MM MISC 1 Container. 02/25/12  Yes Historical Provider, MD   simvastatin (ZOCOR) 20 MG tablet Take 20 mg by mouth daily.  03/31/12  Yes Historical Provider, MD  temazepam (RESTORIL) 15 MG capsule TAKE ONE CAPSULE BY MOUTH AT BEDTIME 04/20/12  Yes Jacques Navy, MD  glucose blood test strip Test 2 (two) times daily. DX 250.00 01/21/12   Jacques Navy, MD    Social History:  reports that she has never smoked. She has never used smokeless tobacco. She reports that she does not drink alcohol or use illicit drugs.  Family History  Problem Relation Age of Onset  . Heart disease Father     CHF  . Lymphoma Sister   . Colon cancer Neg Hx     Review of Systems:  HEENT: Denies headache, blurred vision, runny nose, sore throat,  Neck: Denies thyroid problems,lymphadenopathy Chest : Denies shortness of breath, no history of COPD Heart :  positive history of coronary arterey disease GI: Positive history of chronic nausea vomiting GU: Denies dysuria, urgency, frequency of urination, hematuria Neuro: Denies stroke, seizures, syncope    Physical Exam: Blood pressure 119/58, pulse 72, temperature 98.4 F (36.9 C), temperature source Oral, resp. rate 18, SpO2 92.00%. Constitutional:   Patient is  in no acute distress and cooperative with exam. Head: Normocephalic and atraumatic Mouth: Mucus membranes moist Eyes: PERRL, EOMI, conjunctivae normal Neck: Supple, No Thyromegaly Cardiovascular: RRR, S1 normal, S2 normal Pulmonary/Chest: CTAB, no wheezes, rales, or rhonchi Abdominal: Soft. Non-tender, non-distended, bowel sounds are normal, no masses, organomegaly, or guarding present.  Neurological: Alert, cooperative with exam, , Strenght is normal and symmetric bilaterally, cranial nerve II-XII are grossly intact, no focal motor deficit, sensory intact to light touch bilaterally.  Extremities : Trace edema in the right lower extremity   Labs on Admission:  Results for orders placed during the hospital encounter of 08/15/12 (from the past 48 hour(s))   URINALYSIS, ROUTINE W REFLEX MICROSCOPIC     Status: Abnormal   Collection Time   08/15/12  1:37 PM      Component Value Range Comment   Color, Urine YELLOW  YELLOW    APPearance CLEAR  CLEAR    Specific Gravity, Urine 1.010  1.005 - 1.030    pH 5.5  5.0 - 8.0    Glucose, UA 500 (*) NEGATIVE mg/dL    Hgb urine dipstick NEGATIVE  NEGATIVE    Bilirubin Urine NEGATIVE  NEGATIVE    Ketones, ur NEGATIVE  NEGATIVE mg/dL    Protein, ur NEGATIVE  NEGATIVE mg/dL    Urobilinogen, UA 0.2  0.0 - 1.0 mg/dL    Nitrite NEGATIVE  NEGATIVE    Leukocytes, UA SMALL (*) NEGATIVE   URINE MICROSCOPIC-ADD ON     Status: Abnormal   Collection Time   08/15/12  1:37 PM  Component Value Range Comment   Squamous Epithelial / LPF FEW (*) RARE    WBC, UA 7-10  <3 WBC/hpf   CBC WITH DIFFERENTIAL     Status: Abnormal   Collection Time   08/15/12  1:38 PM      Component Value Range Comment   WBC 5.5  4.0 - 10.5 K/uL    RBC 2.83 (*) 3.87 - 5.11 MIL/uL    Hemoglobin 8.3 (*) 12.0 - 15.0 g/dL    HCT 16.1 (*) 09.6 - 46.0 %    MCV 93.6  78.0 - 100.0 fL    MCH 29.3  26.0 - 34.0 pg    MCHC 31.3  30.0 - 36.0 g/dL    RDW 04.5  40.9 - 81.1 %    Platelets 196  150 - 400 K/uL    Neutrophils Relative 70  43 - 77 %    Neutro Abs 3.8  1.7 - 7.7 K/uL    Lymphocytes Relative 17  12 - 46 %    Lymphs Abs 0.9  0.7 - 4.0 K/uL    Monocytes Relative 11  3 - 12 %    Monocytes Absolute 0.6  0.1 - 1.0 K/uL    Eosinophils Relative 2  0 - 5 %    Eosinophils Absolute 0.1  0.0 - 0.7 K/uL    Basophils Relative 0  0 - 1 %    Basophils Absolute 0.0  0.0 - 0.1 K/uL   COMPREHENSIVE METABOLIC PANEL     Status: Abnormal   Collection Time   08/15/12  1:38 PM      Component Value Range Comment   Sodium 131 (*) 135 - 145 mEq/L    Potassium 4.0  3.5 - 5.1 mEq/L    Chloride 90 (*) 96 - 112 mEq/L    CO2 27  19 - 32 mEq/L    Glucose, Bld 426 (*) 70 - 99 mg/dL    BUN 79 (*) 6 - 23 mg/dL    Creatinine, Ser 9.14  0.50 - 1.10 mg/dL    Calcium  78.2  8.4 - 10.5 mg/dL    Total Protein 7.1  6.0 - 8.3 g/dL    Albumin 3.6  3.5 - 5.2 g/dL    AST 20  0 - 37 U/L    ALT 23  0 - 35 U/L    Alkaline Phosphatase 91  39 - 117 U/L    Total Bilirubin 0.4  0.3 - 1.2 mg/dL    GFR calc non Af Amer 51 (*) >90 mL/min    GFR calc Af Amer 59 (*) >90 mL/min   TROPONIN I     Status: Normal   Collection Time   08/15/12  1:38 PM      Component Value Range Comment   Troponin I <0.30  <0.30 ng/mL   PRO B NATRIURETIC PEPTIDE     Status: Normal   Collection Time   08/15/12  1:38 PM      Component Value Range Comment   Pro B Natriuretic peptide (BNP) 306.6  0 - 450 pg/mL   DIGOXIN LEVEL     Status: Normal   Collection Time   08/15/12  1:38 PM      Component Value Range Comment   Digoxin Level 1.0  0.8 - 2.0 ng/mL     Radiological Exams on Admission: Dg Chest 2 View  08/15/2012  *RADIOLOGY REPORT*  Clinical Data: Shortness of breath, weakness.  CHEST - 2 VIEW  Comparison:  March 31, 2012.  Findings: Moderate cardiomegaly is unchanged.  No acute pulmonary disease is noted.  Bony thorax is intact.  IMPRESSION: No acute cardiopulmonary abnormality seen.   Original Report Authenticated By: Lupita Raider.,  M.D.     Assessment/Plan Dehydration hyponatremia   anemia  Congestive heart failure  Atrial fibrillation  Diabetes mellitus  Hypertension   Dehydration  Patient has elevated BUN, will start her on IV normal saline at 75 mL per hour  Patient has a history of diastolic heart failure , so we'll closely monitor the i's and o's  Hyponatremia  Sodium level is 131 most likely due to  dehydration  Will obtain serum and urine osmolality   congestive heart failure  Patient appears to be euvolemic at this time  Hold Lasix, Zaroxolyn We'll continue to monitor   Atrial fibrillation  Patient is not on anticoagulation  Will continue digoxin  Also obtain digoxin level   Hypertension  Will continue Avapro, Cardura   Diabetes mellitus  Continue Lantus  34 units subcutaneous at bedtime  Sliding scale insulin   Deconditioning  Will obtain PT OT evaluation    Anemia secondary to anemia of chronic disease  patient's hemoglobin today is 8.3 which is close to her baseline We'll continue to monitor h&h  in the hospital  DVT prophylaxis  SCDs  Time Spent on Admission  70 min  [facility] Pager: [phone] 08/15/2012, 3:55 PM

## 2012-08-15 NOTE — ED Notes (Signed)
ZOX:[id number]<AV> Expected date:08/15/12<BR> Expected time:12:28 PM<BR> Means of arrival:Ambulance<BR> Comments:<BR> 89yoF/hyperglycemia/gen. weakness

## 2012-08-15 NOTE — ED Notes (Signed)
Per EMS, pt from home with reports of generalized weakness that has worsened over the last week. EMS reports CBG of 473. Per EMS, family reports that pt lives alone and family checks on pt frequently. EMS reports that family endorses that pt has iron infusions several times a year and has these weak spells.

## 2012-08-15 NOTE — ED Notes (Signed)
Patient transported to X-ray 

## 2012-08-15 NOTE — ED Notes (Signed)
MD at bedside. 

## 2012-08-15 NOTE — ED Notes (Signed)
Attempted to call report to Berdine Dance, RN unable, will call back in a few minutes.

## 2012-08-15 NOTE — ED Provider Notes (Addendum)
History     CSN: [id number]  Arrival date & time 08/15/12  1234   First MD Initiated Contact with Patient 08/15/12 1403      Chief Complaint  Patient presents with  . Hyperglycemia  . Weakness    (Consider location/radiation/quality/duration/timing/severity/associated sxs/prior treatment) The history is provided by the patient and a relative.  pt from home. Family states hx chronic anemia, notes that in past couple of weeks seems generally weak, and at times sob, stating that she has had similar symptoms in past when blood count gets lower than baseline. No recent blood loss. Family notes extensive workup for same, and was dx w iron deficiency anemia, and told by her hematologist that she doesn't make new blood fast enough. Pt denies any current c/o. No chest pain or discomfort. No sob. Is eating and drinking. No abd pain. No nvd. No cough, congestion, fevers or uri symptoms. No recent trauma or fall, walks slowly, minimal distances w walker. No recent change in meds, has been compliant w meds. No orthopnea. No leg swelling/pain. No dysuria or gu c/o. Family also notes blood sugar high, ems noted 473.       Past Medical History  Diagnosis Date  . Inguinal hernia   . Mitral valve insufficiency and aortic valve insufficiency   . Other and unspecified hyperlipidemia   . Pneumonia   . Osteoarthrosis, unspecified whether generalized or localized, unspecified site   . Nephrolithiasis   . Unspecified essential hypertension   . Type II or unspecified type diabetes mellitus without mention of complication, not stated as uncontrolled   . Coronary atherosclerosis of unspecified type of vessel, native or graft   . Congestive heart failure, unspecified   . Atrial fibrillation   . Unspecified asthma   . Anemia, unspecified   . Allergic rhinitis, cause unspecified   . Colon polyps 10/2009    Past Surgical History  Procedure Date  . Breast cyst aspiration     x50  . Abdominal hysterectomy  1990  . Lumbar fusion 2007    [facility]  . Total hip arthroplasty 01/2007    Right - Dr Valentina Gu  . Inguinal hernia repair 2010    [facility]    Family History  Problem Relation Age of Onset  . Heart disease Father     CHF  . Lymphoma Sister   . Colon cancer Neg Hx     History  Substance Use Topics  . Smoking status: Never Smoker   . Smokeless tobacco: Never Used  . Alcohol Use: No    OB History    Grav Para Term Preterm Abortions TAB SAB Ect Mult Living                  Review of Systems  Constitutional: Negative for fever and chills.  HENT: Negative for neck pain.   Eyes: Negative for pain.  Respiratory: Negative for cough and shortness of breath.   Cardiovascular: Negative for chest pain and leg swelling.  Gastrointestinal: Negative for abdominal pain.  Genitourinary: Negative for dysuria and flank pain.  Musculoskeletal: Negative for back pain.  Skin: Negative for rash.  Neurological: Negative for headaches.  Hematological: Does not bruise/bleed easily.  Psychiatric/Behavioral: The patient is not nervous/anxious.     Allergies  Codeine  Home Medications   Current Outpatient Rx  Name  Route  Sig  Dispense  Refill  . CLONAZEPAM 0.5 MG PO TABS   Oral   Take 0.5 mg by mouth 2 (two) times  daily.         Marland Kitchen FERROUS SULFATE 325 (65 FE) MG PO TABS   Oral   Take 325 mg by mouth daily with breakfast.         . MECLIZINE HCL 25 MG PO TABS   Oral   Take 25 mg by mouth 3 (three) times daily as needed.         . ACETAMINOPHEN 500 MG PO TABS   Oral   Take 1,000 mg by mouth 2 (two) times daily. For hip pain         . VITAMIN D3 1000 UNITS PO CAPS   Oral   Take by mouth daily.         Marland Kitchen DIGOXIN 0.125 MG PO TABS   Oral   Take 0.125 mg by mouth daily.         Marland Kitchen DOXAZOSIN MESYLATE 4 MG PO TABS   Oral   Take 4 mg by mouth at bedtime.         . INTEGRA F 125-1 MG PO CAPS   Oral   Take 1 capsule by mouth daily.         . INTEGRA PLUS PO CAPS       TAKE ONE CAPSULE BY MOUTH EVERY DAY.   30 each   1   . FEXOFENADINE HCL 180 MG PO TABS   Oral   Take 180 mg by mouth daily.         . FUROSEMIDE 40 MG PO TABS   Oral   Take 1.5 tablets (60 mg total) by mouth 2 (two) times daily.   90 tablet   6   . GLUCOSE BLOOD VI STRP      Test 2 (two) times daily. DX 250.00   100 each   11   . HYDROCODONE-ACETAMINOPHEN 5-325 MG PO TABS      TAKE ONE TABLET BY MOUTH EVERY 6 HOURS AS NEEDED   120 tablet   0   . INSULIN GLARGINE 100 UNIT/ML [place] SOLN   Subcutaneous   Inject 15 Units into the skin at bedtime.         . IRBESARTAN 300 MG PO TABS   Oral   Take 300 mg by mouth daily.          Marland Kitchen KLOR-CON M20 20 MEQ PO TBCR      TAKE ONE TABLET BY MOUTH THREE TIMES DAILY   90 tablet   3   . METFORMIN HCL 500 MG PO TABS   Oral   Take 1 tablet (500 mg total) by mouth 2 (two) times daily. Take 500 mg by mouth 2 (two) times daily with a meal.   60 tablet   11   . METOCLOPRAMIDE HCL 5 MG PO TABS   Oral   Take 5 mg by mouth 2 (two) times daily.         Marland Kitchen METOLAZONE 2.5 MG PO TABS      TAKE ONE TABLET BY MOUTH IN THE MORNING ON MONDAY, WEDNESDAY, AND FRIDAY 30 MINUTES BEFORE TAKING FUROSEMIDE   30 tablet   2   . POTASSIUM CHLORIDE CRYS ER 20 MEQ PO TBCR   Oral   Take 20 mEq by mouth 3 (three) times daily.         Marland Kitchen PROMETHAZINE HCL 12.5 MG PO TABS      TAKE ONE TABLET BY MOUTH EVERY 6 HOURS AS NEEDED FOR NAUSEA   30 tablet  0   . RELION MINI PEN NEEDLES 31G X 6 MM MISC      1 Container.         Marland Kitchen SIMVASTATIN 20 MG PO TABS   Oral   Take 20 mg by mouth daily.          Marland Kitchen TEMAZEPAM 15 MG PO CAPS      TAKE ONE CAPSULE BY MOUTH AT BEDTIME   30 capsule   5     BP 168/60  Pulse 73  Temp 97.9 F (36.6 C) (Oral)  Resp 17  SpO2 98%  Physical Exam  Nursing note and vitals reviewed. Constitutional: She appears well-developed and well-nourished. No distress.  HENT:  Head: Atraumatic.  Nose: Nose  normal.  Mouth/Throat: Oropharynx is clear and moist.  Eyes: Conjunctivae normal are normal. Pupils are equal, round, and reactive to light. No scleral icterus.  Neck: Neck supple. No JVD present. No tracheal deviation present.  Cardiovascular: Normal rate, regular rhythm, normal heart sounds and intact distal pulses.   Pulmonary/Chest: Effort normal and breath sounds normal. No respiratory distress.  Abdominal: Soft. Normal appearance and bowel sounds are normal. She exhibits no distension. There is no tenderness.  Genitourinary:       No cva tenderness  Musculoskeletal: She exhibits no edema.  Neurological: She is alert.       Alert, oriented. Speech clear. Motor intact bil. Mental status c/w baseline per family.   Skin: Skin is warm and dry. No rash noted.  Psychiatric: She has a normal mood and affect.    ED Course  Procedures (including critical care time)  Labs Reviewed  CBC WITH DIFFERENTIAL - Abnormal; Notable for the following:    RBC 2.83 (*)     Hemoglobin 8.3 (*)     HCT 26.5 (*)     All other components within normal limits  URINALYSIS, ROUTINE W REFLEX MICROSCOPIC - Abnormal; Notable for the following:    Glucose, UA 500 (*)     Leukocytes, UA SMALL (*)     All other components within normal limits  URINE MICROSCOPIC-ADD ON - Abnormal; Notable for the following:    Squamous Epithelial / LPF FEW (*)     All other components within normal limits  COMPREHENSIVE METABOLIC PANEL    Results for orders placed during the hospital encounter of 08/15/12  CBC WITH DIFFERENTIAL      Component Value Range   WBC 5.5  4.0 - 10.5 K/uL   RBC 2.83 (*) 3.87 - 5.11 MIL/uL   Hemoglobin 8.3 (*) 12.0 - 15.0 g/dL   HCT 16.1 (*) 09.6 - 04.5 %   MCV 93.6  78.0 - 100.0 fL   MCH 29.3  26.0 - 34.0 pg   MCHC 31.3  30.0 - 36.0 g/dL   RDW 40.9  81.1 - 91.4 %   Platelets 196  150 - 400 K/uL   Neutrophils Relative 70  43 - 77 %   Neutro Abs 3.8  1.7 - 7.7 K/uL   Lymphocytes Relative 17   12 - 46 %   Lymphs Abs 0.9  0.7 - 4.0 K/uL   Monocytes Relative 11  3 - 12 %   Monocytes Absolute 0.6  0.1 - 1.0 K/uL   Eosinophils Relative 2  0 - 5 %   Eosinophils Absolute 0.1  0.0 - 0.7 K/uL   Basophils Relative 0  0 - 1 %   Basophils Absolute 0.0  0.0 - 0.1 K/uL  COMPREHENSIVE  METABOLIC PANEL      Component Value Range   Sodium 131 (*) 135 - 145 mEq/L   Potassium 4.0  3.5 - 5.1 mEq/L   Chloride 90 (*) 96 - 112 mEq/L   CO2 27  19 - 32 mEq/L   Glucose, Bld 426 (*) 70 - 99 mg/dL   BUN 79 (*) 6 - 23 mg/dL   Creatinine, Ser 4.09  0.50 - 1.10 mg/dL   Calcium 81.1  8.4 - 91.4 mg/dL   Total Protein 7.1  6.0 - 8.3 g/dL   Albumin 3.6  3.5 - 5.2 g/dL   AST 20  0 - 37 U/L   ALT 23  0 - 35 U/L   Alkaline Phosphatase 91  39 - 117 U/L   Total Bilirubin 0.4  0.3 - 1.2 mg/dL   GFR calc non Af Amer 51 (*) >90 mL/min   GFR calc Af Amer 59 (*) >90 mL/min  URINALYSIS, ROUTINE W REFLEX MICROSCOPIC      Component Value Range   Color, Urine YELLOW  YELLOW   APPearance CLEAR  CLEAR   Specific Gravity, Urine 1.010  1.005 - 1.030   pH 5.5  5.0 - 8.0   Glucose, UA 500 (*) NEGATIVE mg/dL   Hgb urine dipstick NEGATIVE  NEGATIVE   Bilirubin Urine NEGATIVE  NEGATIVE   Ketones, ur NEGATIVE  NEGATIVE mg/dL   Protein, ur NEGATIVE  NEGATIVE mg/dL   Urobilinogen, UA 0.2  0.0 - 1.0 mg/dL   Nitrite NEGATIVE  NEGATIVE   Leukocytes, UA SMALL (*) NEGATIVE  URINE MICROSCOPIC-ADD ON      Component Value Range   Squamous Epithelial / LPF FEW (*) RARE   WBC, UA 7-10  <3 WBC/hpf  TROPONIN I      Component Value Range   Troponin I <0.30  <0.30 ng/mL  PRO B NATRIURETIC PEPTIDE      Component Value Range   Pro B Natriuretic peptide (BNP) 306.6  0 - 450 pg/mL  DIGOXIN LEVEL      Component Value Range   Digoxin Level 1.0  0.8 - 2.0 ng/mL   Dg Chest 2 View  08/15/2012  *RADIOLOGY REPORT*  Clinical Data: Shortness of breath, weakness.  CHEST - 2 VIEW  Comparison: March 31, 2012.  Findings: Moderate  cardiomegaly is unchanged.  No acute pulmonary disease is noted.  Bony thorax is intact.  IMPRESSION: No acute cardiopulmonary abnormality seen.   Original Report Authenticated By: Lupita Raider.,  M.D.    Dg Fluoro Guide Ndl Plc/bx  07/27/2012  *RADIOLOGY REPORT*  CLINICAL DATA:  Advanced degenerative changes of the left hip. Pain.  Left HIP INJECTION:  Following informed, written consent, the patient was positioned supine and  the left hip was located under fluoroscopy.  The skin was prepped and draped in the usual sterile fashion.  The superficial soft tissues were anesthetized with 1% lidocaine.  A 22 gauge needle was advanced into the joint.  Placement was confirmed with 1.0 ml of Omnipaque 180.  I then injected 120 mg Depo-Medrol and 1.5 ml 0.5% bupivacaine. The patient tolerated the procedure without immediate complication.  FLUORO TIME:  30 seconds  IMPRESSIONS:  Technically successful left hip steroid injection.   Original Report Authenticated By: Marin Roberts, M.D.       MDM  Iv ns. Labs. Cxr.  Reviewed nursing notes and prior charts for additional history.   hgb c/w baseline.  Possible uti on labs. u cx sent. Rocephin iv.  Glucose elev, ns iv. Insulin sq.    Date: 08/15/2012  Rate: 75  Rhythm: atrial fibrillation  QRS Axis: normal  Intervals: afib  ST/T Wave abnormalities: nonspecific ST/T changes  Conduction Disutrbances:lvh  Narrative Interpretation:   Old EKG Reviewed: unchanged   Given hyperglycemia, uti, anemia, gen weakness, [facility] called to admit - states team 3, med surg.          Suzi Roots, MD 08/15/12 1437  Suzi Roots, MD 08/15/12 [phone]

## 2012-08-15 NOTE — ED Notes (Signed)
Pt transferred via stretcher to [id number] accompanied by Real Cons, NT and family with chart and personal belongings, condition stable at time of transfer.

## 2012-08-16 DIAGNOSIS — I509 Heart failure, unspecified: Secondary | ICD-10-CM

## 2012-08-16 LAB — CBC
MCHC: 31.3 g/dL (ref 30.0–36.0)
MCV: 93.8 fL (ref 78.0–100.0)
Platelets: 190 10*3/uL (ref 150–400)
RDW: 14.6 % (ref 11.5–15.5)
WBC: 4.6 10*3/uL (ref 4.0–10.5)

## 2012-08-16 LAB — GLUCOSE, CAPILLARY
Glucose-Capillary: 135 mg/dL — ABNORMAL HIGH (ref 70–99)
Glucose-Capillary: 301 mg/dL — ABNORMAL HIGH (ref 70–99)
Glucose-Capillary: 271 mg/dL — ABNORMAL HIGH (ref 70–99)
Glucose-Capillary: 308 mg/dL — ABNORMAL HIGH (ref 70–99)

## 2012-08-16 LAB — HEMOGLOBIN AND HEMATOCRIT, BLOOD
HCT: 23.9 % — ABNORMAL LOW (ref 36.0–46.0)
Hemoglobin: 7.5 g/dL — ABNORMAL LOW (ref 12.0–15.0)

## 2012-08-16 LAB — COMPREHENSIVE METABOLIC PANEL
AST: 15 U/L (ref 0–37)
Albumin: 3.2 g/dL — ABNORMAL LOW (ref 3.5–5.2)
Chloride: 101 mEq/L (ref 96–112)
Creatinine, Ser: 0.86 mg/dL (ref 0.50–1.10)
Total Bilirubin: 0.2 mg/dL — ABNORMAL LOW (ref 0.3–1.2)
Total Protein: 6.4 g/dL (ref 6.0–8.3)

## 2012-08-16 LAB — OSMOLALITY: Osmolality: 314 mOsm/kg — ABNORMAL HIGH (ref 275–300)

## 2012-08-16 MED ORDER — ENSURE COMPLETE PO LIQD
237.0000 mL | Freq: Two times a day (BID) | ORAL | Status: DC
  Administered 2012-08-16 – 2012-08-17 (×3): 237 mL via ORAL

## 2012-08-16 NOTE — Progress Notes (Signed)
Utilization review completed.  

## 2012-08-16 NOTE — Progress Notes (Signed)
PM note: reviewed PT note and social work notes. Have spoken with her son.  Plan - as she recovers will look to ST-SNF with goal of being able to transition to [place].  AM lab

## 2012-08-16 NOTE — Progress Notes (Signed)
INITIAL NUTRITION ASSESSMENT  DOCUMENTATION CODES Per approved criteria  -Not Applicable   INTERVENTION: - Ensure Complete BID - Assisted pt with ordering meals - Encouraged increased meal intake - Will continue to monitor   NUTRITION DIAGNOSIS: Inadequate oral intake related to poor appetite as evidenced by pt statement.   Goal: Pt to consume >75% of meals/supplements.   Monitor:  Weights, labs, intake  Reason for Assessment: Nutrition risk   79 y.o. female  Admitting Dx: Weakness  ASSESSMENT: Pt reports poor appetite for the past week with pt still eating her usual 3 meals/day just in smaller amounts at mealtimes. Pt reports 5 pound unintentional weight loss in the past month. Family reports pt with decline in the past 6 months and that pt was bedbound for a week PTA. Pt denies any problems chewing or swallowing. Pt c/o dry mouth. Pt reports her son checks her blood sugars for her at home and has been making her diabetic meals. Pt's intake improved yesterday to pt consuming 50% of meals.   Height: Ht Readings from Last 1 Encounters:  08/15/12 5\' 4"  (1.626 m)    Weight: Wt Readings from Last 1 Encounters:  08/15/12 129 lb (58.514 kg)    Ideal Body Weight: 120 lb  % Ideal Body Weight: 107  Wt Readings from Last 10 Encounters:  08/15/12 129 lb (58.514 kg)  07/25/12 129 lb 4.8 oz (58.65 kg)  05/30/12 124 lb 9.6 oz (56.518 kg)  04/21/12 124 lb (56.246 kg)  03/31/12 120 lb 5.9 oz (54.6 kg)  03/28/12 125 lb 3.4 oz (56.795 kg)  03/23/12 124 lb (56.246 kg)  01/20/12 130 lb (58.968 kg)  12/08/11 117 lb 11.6 oz (53.4 kg)  09/15/11 124 lb 9.6 oz (56.518 kg)    Usual Body Weight: 134 lb  % Usual Body Weight: 96  BMI:  Body mass index is 22.14 kg/(m^2).  Estimated Nutritional Needs: Kcal: 1500-1750 Protein: 60-70g Fluid: 1.5-1.7L/day  Skin: Non-pitting LLE edema  Diet Order: Sodium Restricted  EDUCATION NEEDS: -No education needs identified at this  time   Intake/Output Summary (Last 24 hours) at 08/16/12 1057 Last data filed at 08/16/12 0630  Gross per 24 hour  Intake 1264.17 ml  Output      0 ml  Net 1264.17 ml    Last BM: 1/5  Labs:   Lab 08/16/12 0449 08/15/12 1338  NA 139 131*  K 3.2* 4.0  CL 101 90*  CO2 30 27  BUN 59* 79*  CREATININE 0.86 0.95  CALCIUM 9.4 10.2  MG -- --  PHOS -- --  GLUCOSE 130* 426*   Lab Results  Component Value Date   HGBA1C 7.0* 06/24/2012    CBG (last 3)   Basename 08/16/12 0802 08/15/12 2119 08/15/12 1653  GLUCAP 135* 275* 245*    Scheduled Meds:   . cefTRIAXone (ROCEPHIN)  IV  1 g Intravenous Q24H  . digoxin  0.125 mg Oral Daily  . doxazosin  4 mg Oral QHS  . insulin aspart  0-9 Units Subcutaneous TID WC  . insulin glargine  34 Units Subcutaneous QHS  . irbesartan  150 mg Oral Daily  . loratadine  10 mg Oral Daily  . metoCLOPramide  5 mg Oral BID    Continuous Infusions:   . sodium chloride 50 mL/hr at 08/16/12 1610    Past Medical History  Diagnosis Date  . Inguinal hernia   . Mitral valve insufficiency and aortic valve insufficiency   . Other and unspecified  hyperlipidemia   . Pneumonia   . Osteoarthrosis, unspecified whether generalized or localized, unspecified site   . Nephrolithiasis   . Unspecified essential hypertension   . Type II or unspecified type diabetes mellitus without mention of complication, not stated as uncontrolled   . Coronary atherosclerosis of unspecified type of vessel, native or graft   . Congestive heart failure, unspecified   . Atrial fibrillation   . Unspecified asthma   . Anemia, unspecified   . Allergic rhinitis, cause unspecified   . Colon polyps 10/2009    Past Surgical History  Procedure Date  . Breast cyst aspiration     x50  . Abdominal hysterectomy 1990  . Lumbar fusion 2007    [facility]  . Total hip arthroplasty 01/2007    Right - Dr Valentina Gu  . Inguinal hernia repair [address] [place], [place],  [place] [phone] Pager [phone] After Hours Pager

## 2012-08-16 NOTE — Evaluation (Signed)
Physical Therapy Evaluation Patient Details Name: [name] MRN: [id number] DOB: January 18, 1920 Today's Date: 08/16/2012 Time: 1478-2956 PT Time Calculation (min): 27 min  PT Assessment / Plan / Recommendation Clinical Impression  79 yo female with history of CHF and DM admitted with decreased mobility and intake.  Today she is able to walk with RW a short distance with assist, but is limited by c/o left hip pain, decreased ability to bear weight on it and nausea in upright.  Feel she will benefit from continued PT at d/c and will need 24/7 assist    PT Assessment  Patient needs continued PT services    Follow Up Recommendations  SNF;Home health PT (HHPT if family can provide 24/7 care)    Does the patient have the potential to tolerate intense rehabilitation      Barriers to Discharge Decreased caregiver support      Equipment Recommendations  None recommended by PT    Recommendations for Other Services OT consult   Frequency Min 3X/week    Precautions / Restrictions Precautions Precautions: Fall Precaution Comments: pt weak and gait is impaired by pain in left hip . Pt needs to use a RW at all times  Restrictions Weight Bearing Restrictions: No   Pertinent Vitals/Pain Pt c/o pain in left hip and has inability to fully bear weight on it in gait      Mobility  Bed Mobility Bed Mobility: Sit to Supine Sit to Supine: 3: Mod assist Details for Bed Mobility Assistance: pt limited by pain in hip when trying to get to EOB Transfers Transfers: Sit to Stand;Stand to Sit Sit to Stand: 4: Min assist Stand to Sit: 4: Min assist Details for Transfer Assistance: pt did not reach back to push up with arms even with cues. Ambulation/Gait Ambulation/Gait Assistance: 4: Min assist Ambulation Distance (Feet): 50 Feet Assistive device: Rolling walker Ambulation/Gait Assistance Details: pt needs assist for balance and encouragement in walking due to pain in left hip. Gait Pattern:  Step-to pattern;Decreased step length - left;Decreased stride length;Decreased stance time - left;Antalgic;Decreased dorsiflexion - left;Decreased weight shift to left;Left flexed knee in stance Gait velocity: decreased General Gait Details: pt self limits distance due to pain and nausea. Pt unable to put left foot flat on floor in gait Stairs: No Wheelchair Mobility Wheelchair Mobility: No    Shoulder Instructions     Exercises Other Exercises Other Exercises: pt encouraged to do active standing to activate core, elevate rib cage and deep reathe   PT Diagnosis: Difficulty walking;Abnormality of gait;Generalized weakness;Acute pain  PT Problem List: Decreased strength;Decreased activity tolerance;Pain;Decreased mobility PT Treatment Interventions: DME instruction;Gait training;Functional mobility training;Therapeutic activities;Therapeutic exercise;Patient/family education   PT Goals Acute Rehab PT Goals PT Goal Formulation: With patient Time For Goal Achievement: 08/30/12 Potential to Achieve Goals: Good Pt will go Supine/Side to Sit: Independently PT Goal: Supine/Side to Sit - Progress: Goal set today Pt will go Sit to Supine/Side: Independently PT Goal: Sit to Supine/Side - Progress: Goal set today Pt will go Sit to Stand: with modified independence PT Goal: Sit to Stand - Progress: Goal set today Pt will go Stand to Sit: with modified independence PT Goal: Stand to Sit - Progress: Goal set today Pt will Ambulate: 51 - 150 feet;with supervision PT Goal: Ambulate - Progress: Goal set today Pt will Perform Home Exercise Program: with supervision, verbal cues required/provided PT Goal: Perform Home Exercise Program - Progress: Goal set today  Visit Information  Last PT Received On: 08/16/12  Assistance Needed: +1    Subjective Data  Subjective: "I don't know if I can swalllow it right now"  Pt reports of feeling nauseous in upright Patient Stated Goal: to go home   Prior  Functioning  Home Living Lives With: Alone Available Help at Discharge: Available PRN/intermittently;Family Type of Home: House Home Access: Level entry Home Layout: One level Home Adaptive Equipment: Walker - rolling Additional Comments: pt states she family lives nearby Prior Function Level of Independence: Independent Able to Take Stairs?: No Driving: No Communication Communication: No difficulties    Cognition  Overall Cognitive Status: Appears within functional limits for tasks assessed/performed Arousal/Alertness: Awake/alert Orientation Level: Appears intact for tasks assessed Behavior During Session: [facility] for tasks performed    Extremity/Trunk Assessment Right Lower Extremity Assessment RLE ROM/Strength/Tone: WFL for tasks assessed RLE Sensation: WFL - Light Touch;WFL - Proprioception RLE Coordination: WFL - gross/fine motor Left Lower Extremity Assessment LLE ROM/Strength/Tone: Deficits LLE ROM/Strength/Tone Deficits: pt c/o pain in left hip. Unable to achieve full hip or knee extension in supine and unablet to put foot flat on floor when walking LLE Sensation: WFL - Light Touch;WFL - Proprioception LLE Coordination: WFL - gross motor Trunk Assessment Trunk Assessment: Normal (pt thin with generalized muscle atrophy)   Balance Balance Balance Assessed: Yes Static Sitting Balance Static Sitting - Balance Support: No upper extremity supported Static Sitting - Level of Assistance: 7: Independent Static Sitting - Comment/# of Minutes: pt sitting up on EOB upon my arrival Static Standing Balance Static Standing - Balance Support: Bilateral upper extremity supported;During functional activity Static Standing - Level of Assistance: 6: Modified independent (Device/Increase time)  End of Session PT - End of Session Equipment Utilized During Treatment: Gait belt Activity Tolerance: Patient limited by pain Patient left: in chair;with call bell/phone within reach Nurse  Communication: Mobility status  GP     [name], [place] [phone] 08/16/2012, 11:44 AM

## 2012-08-16 NOTE — Clinical Social Work Psychosocial (Addendum)
    Clinical Social Work Department BRIEF PSYCHOSOCIAL ASSESSMENT 08/16/2012  Patient:  [name], [name]     Account Number:  [id number]     Admit date:  08/15/2012  Clinical Social Worker:  Jacelyn Grip  Date/Time:  08/16/2012 03:15 PM  Referred by:  RN  Date Referred:  08/16/2012 Referred for  SNF Placement   Other Referral:   Interview type:  Patient Other interview type:   and pt son via telephone    PSYCHOSOCIAL DATA Living Status:  ALONE Admitted from facility:   Level of care:   Primary support name:  [name]/son Primary support relationship to patient:  CHILD, ADULT Degree of support available:   moderate    CURRENT CONCERNS Current Concerns  Post-Acute Placement   Other Concerns:    SOCIAL WORK ASSESSMENT / PLAN CSW received notification from RN that PT recommending Home with [facility] with 24 hour care vs SNF placement.    CSW met with pt at bedside re: disposition. No family present at bedside. Pt reports she lives alone and pt daughter lives close by, but unsure if they will be able to provide 24 hour care. CSW discussed recommendation for SNF placement and pt states that she would be willing to go to SNF if that is the recommendation. Pt asked CSW to call pt son, [name] Sours to discuss further.    CSW spoke to pt son, [name] Sours via telephone. Pt son reports that he and pt daughter are interested in placement for rehab and potential placement in the future at an ALF. Pt son is interested in [facility] as pt has been to facility before and second choice would be [facility], but ageeable to SNF search in [facility].    CSW to complete FL2 and initiate SNF search to [facility]. CSW to do so at this time due to technical issues with computer system used to complete FL2 and initiate SNF search. CSW to follow up with pt and pt family in regard to bed offers. CSW contacted [facility] and notified facility of pt and pt family interest. CSW to facilitate pt discharge needs  when pt medically stable for discharge.   Assessment/plan status:  Psychosocial Support/Ongoing Assessment of Needs Other assessment/ plan:   discharge planning   Information/referral to community resources:   [facility], In list    PATIENT'S/FAMILY'S RESPONSE TO PLAN OF CARE: Pt alert and oriented x 4. Pt is agreeable to SNF placement and pt family feels this will be most beneficial for pt. Pt states that she does not remember much about previous stay at [facility].

## 2012-08-16 NOTE — Progress Notes (Addendum)
Clinical Social Work Department CLINICAL SOCIAL WORK PLACEMENT NOTE 08/16/2012  Patient:  TYRISHA, BENNINGER  Account Number:  [id number] Admit date:  08/15/2012  Clinical Social Worker:  Jacelyn Grip  Date/time:  08/16/2012 03:15 PM  Clinical Social Work is seeking post-discharge placement for this patient at the following level of care:   SKILLED NURSING   (*CSW will update this form in Epic as items are completed)   08/16/2012  Patient/family provided with [facility] Department of Clinical Social Work's list of facilities offering this level of care within the geographic area requested by the patient (or if unable, by the patient's family).  08/16/2012  Patient/family informed of their freedom to choose among providers that offer the needed level of care, that participate in Medicare, Medicaid or managed care program needed by the patient, have an available bed and are willing to accept the patient.  08/16/2012  Patient/family informed of [facility]' ownership interest in [facility], as well as of the fact that they are under no obligation to receive care at this facility.  PASARR submitted to EDS on 08/16/2012 PASARR number received from EDS on 08/16/2012  FL2 transmitted to all facilities in geographic area requested by pt/family on  08/17/2012 (CSW, Ernestine Mcmurray, Re[place]) FL2 transmitted to all facilities within larger geographic area on   Patient informed that his/her managed care company has contracts with or will negotiate with  certain facilities, including the following:     Patient/family informed of bed offers received:  08/18/2012 Patient chooses bed at [facility] Physician recommends and patient chooses bed at    Patient to be transferred to  on  [facility] on 08/18/2012 Patient to be transferred to facility by ambulance Sharin Mons)  The following physician request were entered in Epic:   Additional  Comments:    Jacklynn Lewis, MSW, LCSWA  Clinical Social Work [phone]

## 2012-08-16 NOTE — Progress Notes (Addendum)
Pt BP is 136/32 manual. Pt is alert and oriented, not experiencing any discomfort, and MD is aware no new orders recieved at this time. Will continue to monitor.

## 2012-08-16 NOTE — Progress Notes (Signed)
Subjective: [name] has been admitted with a diagnosis of Heart failure. She presented with increased weakness and SOB. Initial eval with CXR w/o pulmonary edema, BNP 306, Hgb 8.0. She has been bedbound and has had very poor PO intake resulting in probable dehydration with initial chemistry revealing Na 139, BUN 59 .  On questioning [name] doesn't recall any specific complaints but does admit that she has felt bad for a week with poor appetite. No abdominal pain, no fever, no V/V  Objective: Lab: Lab Results  Component Value Date   WBC 4.6 08/16/2012   HGB 7.6* 08/16/2012   HCT 24.3* 08/16/2012   MCV 93.8 08/16/2012   PLT 190 08/16/2012   BMET    Component Value Date/Time   NA 139 08/16/2012 0449   K 3.2* 08/16/2012 0449   CL 101 08/16/2012 0449   CO2 30 08/16/2012 0449   GLUCOSE 130* 08/16/2012 0449   BUN 59* 08/16/2012 0449   CREATININE 0.86 08/16/2012 0449   CALCIUM 9.4 08/16/2012 0449   GFRNONAA 58* 08/16/2012 0449   GFRAA 67* 08/16/2012 0449   Lab Results  Component Value Date   HGBA1C 7.0* 06/24/2012     Imaging: CXR: IMPRESSION:  No acute cardiopulmonary abnormality seen.   Scheduled Meds:   . cefTRIAXone (ROCEPHIN)  IV  1 g Intravenous Q24H  . digoxin  0.125 mg Oral Daily  . doxazosin  4 mg Oral QHS  . insulin aspart  0-9 Units Subcutaneous TID WC  . insulin glargine  34 Units Subcutaneous QHS  . irbesartan  150 mg Oral Daily  . loratadine  10 mg Oral Daily  . metoCLOPramide  5 mg Oral BID   Continuous Infusions:   . sodium chloride 100 mL/hr at 08/15/12 1902   PRN Meds:.clonazePAM, HYDROcodone-acetaminophen, ondansetron (ZOFRAN) IV, ondansetron, temazepam   Physical Exam: Filed Vitals:   08/15/12 2130  BP: 135/49  Pulse: 92  Temp: 98.5 F (36.9 C)  Resp: 18   Gen'l- Frail elderly white woman in no distress HEENT - C&S clear Cor 2+ radial pulse, RRR, quiet precordium, II/VII systolic murmur PUlm - normal respirations w/o increased WOB, no rales or wheezes Abd  - BS+, soft, no guarding or rebound Neuro - awake and alert.      Assessment/Plan: 1. CArdiac - enzymes normal, BNP low, CXR clear. No signs of heart failure or cardiac decompensation.  2. Anemia - chronic anemia with MDS. Transfusion threshold is less than 7.0 g. Plan Close observation as she is hydrated - f/u H/H at 1600 hrs  3. DM - last A1C was good. Plan Continue sliding scale along with her home regimen  4. Dehydration - may have had viral illness for the past week as cause of poor intake. Plan Gentle hydration - 50 cc/hr  Code status - currently full code   [employer] IM (o) [phone]; (c) [phone] Call-grp - [name] IM  Tele: [phone]  08/16/2012, 6:05 AM

## 2012-08-17 DIAGNOSIS — R413 Other amnesia: Secondary | ICD-10-CM

## 2012-08-17 LAB — URINE CULTURE: Colony Count: 15000

## 2012-08-17 LAB — BASIC METABOLIC PANEL
CO2: 30 mEq/L (ref 19–32)
Calcium: 9.4 mg/dL (ref 8.4–10.5)
Creatinine, Ser: 0.86 mg/dL (ref 0.50–1.10)
GFR calc Af Amer: 67 mL/min — ABNORMAL LOW (ref >90)
GFR calc non Af Amer: 58 mL/min — ABNORMAL LOW (ref >90)
Sodium: 138 mEq/L (ref 135–145)

## 2012-08-17 LAB — HEMOGLOBIN AND HEMATOCRIT, BLOOD
HCT: 25.2 % — ABNORMAL LOW (ref 36.0–46.0)
Hemoglobin: 7.6 g/dL — ABNORMAL LOW (ref 12.0–15.0)
Hemoglobin: 7.6 g/dL — ABNORMAL LOW (ref 12.0–15.0)

## 2012-08-17 LAB — GLUCOSE, CAPILLARY: Glucose-Capillary: 309 mg/dL — ABNORMAL HIGH (ref 70–99)

## 2012-08-17 MED ORDER — VITAMINS A & D EX OINT
TOPICAL_OINTMENT | CUTANEOUS | Status: AC
  Administered 2012-08-17: 11:00:00
  Filled 2012-08-17: qty 5

## 2012-08-17 MED ORDER — GLUCERNA SHAKE PO LIQD
237.0000 mL | Freq: Three times a day (TID) | ORAL | Status: DC
  Administered 2012-08-17 – 2012-08-18 (×3): 237 mL via ORAL
  Filled 2012-08-17 (×5): qty 237

## 2012-08-17 MED ORDER — BIOTENE DRY MOUTH MT LIQD
15.0000 mL | Freq: Two times a day (BID) | OROMUCOSAL | Status: DC
  Administered 2012-08-17 – 2012-08-18 (×2): 15 mL via OROMUCOSAL

## 2012-08-17 MED ORDER — ALUM & MAG HYDROXIDE-SIMETH 200-200-20 MG/5ML PO SUSP
30.0000 mL | Freq: Once | ORAL | Status: AC
  Administered 2012-08-17: 30 mL via ORAL
  Filled 2012-08-17: qty 30

## 2012-08-17 NOTE — Progress Notes (Signed)
CSW assisting with d/c planning. Pt/family have accepted SNF bed at [facility]. SNF is able to accept pt 1/9 if pt is stable for d/c.   Cori Razor LCSW [phone]

## 2012-08-17 NOTE — Progress Notes (Signed)
Subjective: Awake. Has no complaints. Did not sleep very well  Objective: Lab: Lab Results  Component Value Date   WBC 4.6 08/16/2012   HGB 7.6* 08/17/2012   HCT 25.2* 08/17/2012   MCV 93.8 08/16/2012   PLT 190 08/16/2012   BMET    Component Value Date/Time   NA 138 08/17/2012 0424   K 3.3* 08/17/2012 0424   CL 101 08/17/2012 0424   CO2 30 08/17/2012 0424   GLUCOSE 114* 08/17/2012 0424   BUN 40* 08/17/2012 0424   CREATININE 0.86 08/17/2012 0424   CALCIUM 9.4 08/17/2012 0424   GFRNONAA 58* 08/17/2012 0424   GFRAA 67* 08/17/2012 0424     Imaging:  Scheduled Meds:   . cefTRIAXone (ROCEPHIN)  IV  1 g Intravenous Q24H  . digoxin  0.125 mg Oral Daily  . doxazosin  4 mg Oral QHS  . feeding supplement  237 mL Oral BID BM  . insulin aspart  0-9 Units Subcutaneous TID WC  . insulin glargine  34 Units Subcutaneous QHS  . irbesartan  150 mg Oral Daily  . loratadine  10 mg Oral Daily  . metoCLOPramide  5 mg Oral BID   Continuous Infusions:   . sodium chloride 50 mL (08/16/12 1947)   PRN Meds:.clonazePAM, HYDROcodone-acetaminophen, ondansetron (ZOFRAN) IV, ondansetron, temazepam   Physical Exam: Filed Vitals:   08/17/12 0445  BP: 125/42  Pulse: 68  Temp: 97.6 F (36.4 C)  Resp: 18  Gen'l- elderly white woman in no distress PUlm - normal respirations, lungs are clear Cor - 2+ radial pulse, controlled rate Abd soft.     Assessment/Plan: 1. Cardiac - stable  2. Anemia - drop in Hgb secondary to hydration, holding stable at 7.6 g Hgb  3. DM -  Stable on sliding scale  4. Dehydration - taking POs well. BUN down to 40 (59).  Dispo - for ST-SNF. Anticipate being ready for transfer 1/9   [facility] Reg[facility] (o) [phone]; (c) [phone] Call-grp - [name] IM  Tele: [phone]  08/17/2012, 7:31 AM

## 2012-08-17 NOTE — Progress Notes (Signed)
Physical Therapy Treatment Patient Details Name: [name] MRN: [id number] DOB: 1919-12-28 Today's Date: 08/17/2012 Time: 1191-4782 PT Time Calculation (min): 30 min  PT Assessment / Plan / Recommendation Comments on Treatment Session  Pt slightly better today, but continues to have generazlied weakness and pain and weakness in LLE    Follow Up Recommendations  SNF;Home health PT (HHPT if family can provide 24/7 care)     Does the patient have the potential to tolerate intense rehabilitation     Barriers to Discharge        Equipment Recommendations  None recommended by PT    Recommendations for Other Services OT consult  Frequency Min 3X/week   Plan      Precautions / Restrictions     Pertinent Vitals/Pain Pt continues to c/o pain in left hip and leg    Mobility  Bed Mobility Details for Bed Mobility Assistance: pt sitting up on edge of bed Transfers Transfers: Sit to Stand;Stand to Sit Sit to Stand: 4: Min assist Stand to Sit: 4: Min assist Details for Transfer Assistance: pt needs consistent cues to push up with arms Ambulation/Gait Ambulation/Gait Assistance: 4: Min assist Ambulation Distance (Feet): 60 Feet Assistive device: Rolling walker Ambulation/Gait Assistance Details: pt needs consistent encouragement. and min assist for balance Gait Pattern: Step-to pattern;Decreased step length - left;Decreased stride length;Decreased stance time - left;Antalgic;Decreased dorsiflexion - left;Decreased weight shift to left;Left flexed knee in stance Gait velocity: decreased General Gait Details: pt limited by pain and decreased ability to fully bear weight on LLE Stairs: No Wheelchair Mobility Wheelchair Mobility: No    Exercises General Exercises - Lower Extremity Ankle Circles/Pumps: AROM;Both;5 reps;Seated Gluteal Sets: AROM;Both;5 reps;Standing Long Arc Quad: AROM;Both;5 reps;Seated Other Exercises Other Exercises: deep breathing Other Exercises: active  trunk extension in standing Other Exercises: repeated sit to stand x 5 repetitions for strengthening   PT Diagnosis:    PT Problem List:   PT Treatment Interventions:     PT Goals Acute Rehab PT Goals PT Goal Formulation: With patient Time For Goal Achievement: 08/30/12 Potential to Achieve Goals: Good Pt will go Sit to Supine/Side: Independently;with HOB 0 degrees Pt will go Sit to Stand: with modified independence PT Goal: Sit to Stand - Progress: Progressing toward goal Pt will go Stand to Sit: with modified independence PT Goal: Stand to Sit - Progress: Progressing toward goal Pt will Ambulate: 51 - 150 feet;with supervision PT Goal: Ambulate - Progress: Progressing toward goal Pt will Perform Home Exercise Program: with supervision, verbal cues required/provided PT Goal: Perform Home Exercise Program - Progress: Progressing toward goal  Visit Information  Last PT Received On: 08/17/12 Assistance Needed: +1    Subjective Data  Subjective: My legs feel like butter Patient Stated Goal: none stated   Cognition  Overall Cognitive Status: Appears within functional limits for tasks assessed/performed Arousal/Alertness: Awake/alert Orientation Level: Appears intact for tasks assessed Behavior During Session: [facility] for tasks performed    Balance  Balance Balance Assessed: Yes Static Sitting Balance Static Sitting - Balance Support: No upper extremity supported Static Sitting - Level of Assistance: 7: Independent Static Standing Balance Static Standing - Balance Support: Bilateral upper extremity supported;During functional activity Static Standing - Level of Assistance: 6: Modified independent (Device/Increase time) Static Standing - Comment/# of Minutes: with empahsis on standing erect  End of Session PT - End of Session Equipment Utilized During Treatment: Gait belt Activity Tolerance: Patient limited by pain Patient left: in chair;with call bell/phone within  reach Nurse Communication: Mobility status   GP    Bayard Hugger. Manson Passey, PT 08/17/2012, 3:28 PM

## 2012-08-18 DIAGNOSIS — I10 Essential (primary) hypertension: Secondary | ICD-10-CM

## 2012-08-18 LAB — GLUCOSE, CAPILLARY
Glucose-Capillary: 165 mg/dL — ABNORMAL HIGH (ref 70–99)
Glucose-Capillary: 247 mg/dL — ABNORMAL HIGH (ref 70–99)

## 2012-08-18 MED ORDER — GLUCERNA SHAKE PO LIQD: 237.0000 mL | Freq: Three times a day (TID) | ORAL | Status: DC

## 2012-08-18 NOTE — Progress Notes (Signed)
Patient to be discharged to SNF and transported via ambulance. Copies of all medications and instructions to be sent with PTAR to facility.

## 2012-08-18 NOTE — Discharge Summary (Signed)
[name], [name] NO.:  [id number]  MEDICAL RECORD NO.:  [id number]  LOCATION:  1319                         FACILITY:  [facility]  PHYSICIAN:  Rosalyn Gess. Marquita Lias, MD  DATE OF BIRTH:  [birth year]  DATE OF ADMISSION:  08/15/2012 DATE OF DISCHARGE:  08/18/2012                              DISCHARGE SUMMARY   ADMITTING DIAGNOSES: 1. Dehydration with elevated BUN. 2. Hyponatremia. 3. Congestive heart failure without acute exacerbation. 4. Atrial fibrillation. 5. Hypertension. 6. Insulin-dependent diabetes. 7. Deconditioning. 8. Anemia secondary to chronic disease/ myelodysplastic syndrome.  DISCHARGE DIAGNOSES: 1. Dehydration with elevated BUN. 2. Hyponatremia. 3. Congestive heart failure without acute exacerbation. 4. Atrial fibrillation. 5. Hypertension. 6. Insulin-dependent diabetes. 7. Deconditioning. 8. Anemia secondary to chronic disease/ myelodysplastic syndrome.  CONSULTANTS:  None.  PROCEDURES:  Two-view chest x-ray on the day of admission which showed no acute cardiopulmonary abnormality.  HISTORY OF PRESENT ILLNESS:  [name] is a 79 year old woman with a history of atrial fibrillation, hypertension, insulin controlled diabetes, congestive heart failure, and recurrent anemia secondary to myelodysplastic syndrome and chronic AVMs.  Per the patient's family, patient has declined over the past 6 months, but for the past week has been bedbound.  She has had very poor p.o. intake of both food and fluids.  She fatigues very easily.  Has got to the point where she cannot manage her ADLs due to weakness.  There is no report of any fever, chest pain, but she does have significant shortness of breath. Because of her history of multiple medical problems including anemia and acute presentation of dehydration, she was admitted for IV fluids and evaluation. Please see the H and P for past medical history, family history, social history, and medications at  admission.  ADMISSION PHYSICAL EXAMINATION:  Significant for a; VITAL SIGNS:  Blood pressure of 119/58, respirations were 18.  GENERAL APPEARANCE:  An elderly woman, frail who appears poorly nourished.  CARDIOVASCULAR:  Irregularly irregular rhythm.  CHEST:  Clear with no wheezes, rales, or rhonchi.  ABDOMEN:  Soft and nontender.  NEUROLOGIC:  Notable for patient being alert and cooperative.  ADMISSION LABORATORY:  Revealed a specific gravity on urinalysis of 1.010.  She had 7-10 wbc's per high-powered field.  White count was 5500, hemoglobin was 8.3 g.  Electrolytes with a sodium of 131, BUN of 79, creatinine 0.95.  ProBNP was 306, normal range.  Digoxin level is 1.02 therapeutic.  HOSPITAL COURSE: 1. Dehydration.  The patient was gently hydrated and on this regimen,     her BUN dropped to 40.  Creatinine remained stable at 0.86.  Sodium     improved to 138.  Potassium was just slightly low at 3.3.  The     patient did improve in terms of p.o. intake, taking a regular diet     and fluids.  With rehydration, she also regained her strength.     This problem was considered resolved. 2. Electrolyte abnormality.  Resolved with hydration. 3. Congestive heart failure.  The patient remained stable.  She had no     obvious signs of heart failure on physical exam or by laboratory     with her BNP remaining  normal at 306.  Plan is for to continue on     her home medications. 4. Hypertension.  The patient's blood pressure has been well     controlled during this hospital stay.  She will continue on her     home medications. 5. Diabetes.  The patient's last hemoglobin A1c in November was 7%.     She will continue on her home regimen.  The patient has been     treated liberally allowing for elevated CBGs because of the risk of     hypoglycemia and the low probability of long-term diabetic end-     organ damage. 6. Anemia.  The patient has chronic anemia.  With hydration, her      hemoglobin dropped to 7.6 g.  Her usual transfusion threshold was 7     g of hemoglobin or less.  The patient will need to have followup H     and H in 1 week with decision made at that time as well that she     requires intervention or treatment.  With the patient being rehydrated with her electrolytes being stable having no acute exacerbation of her underlying chronic disease, at this time she is ready for transfer to skilled care.  During this hospitalization, she was seen by PT and OT who did recommend a skilled care for her to regain strength and mobility.  Discussions have been held with her family and she is not a candidate to return to independent living.  The plan is for the patient to complete short-term rehabilitation and then transfer to assisted living.  PHYSICAL EXAMINATION AT DISCHARGE:  VITAL SIGNS: Temperature was 98.2, blood pressure was 140/84, heart rate was 84, respiratory rate was 16, oxygen saturation was 96%. GENERAL APPEARANCE:  This is a very elderly woman sitting on the side of the bed eating breakfast.  She is in no acute distress. HEENT:  Conjunctivae and sclerae were clear.  Pupils equal, round, and reactive. NECK:  Supple. CHEST:  The patient is moving air well.  Her lungs are clear to auscultation and percussion. CARDIOVASCULAR:  2+ radial pulses.  Precordium was quiet.  She has an irregularly irregular controlled rate.  She has a 3/6 systolic murmur heard best at the left sternal border. ABDOMEN:  Soft.  No guarding or rebound was noted. PELVIC AND RECTAL:  Deferred. EXTREMITIES:  Without significant deformity but she has interosseous wasting and she is thin. DERM:  The patient's skin is clear.  There are no signs of skin breakdown. NEUROLOGIC:  The patient is awake, alert.  She is oriented to person, place, time and context.  She follows commands nicely.  Her speech is clear.  FINAL LABORATORY:  Hemoglobin 7.6 g on January 8.  Chemistries  on January 8 with a sodium of 138, potassium 3.3, chloride 101, CO2 of 32, BUN of 40, creatinine 0.86.  DISPOSITION:  The patient is ready for transfer to skilled care.  The patient will be seen in the office in follow up in 7 days, at which time she will have a repeat H and H with appropriate actions to be based on results.  The patient's condition at the time of discharge dictation is medically stable, but guarded given her advanced age and multiple comorbidities.     Rosalyn Gess Raphel Stickles, MD     MEN/MEDQ  D:  08/18/2012  T:  08/18/2012  Job:  [id number]

## 2012-08-18 NOTE — Progress Notes (Signed)
Clinical Child psychotherapist facilitated pt discharge needs including contacting facility, faxing pt discharge information via [facility], discussing with pt at bedside and pt daughter via telephone, providing RN phone number to call report, and scheduling non-emergency ambulance transportation for 2 pm. RN, pt, and pt daughter aware; pt daughter notified that [facility] files transport with pt insurance and no guarantee for coverage, pt daughter expressed understanding. No further social work needs identified at this time.   [facility] scheduled for 2 pm transport to [facility].  Clinical Social Worker signing off.   Jacklynn Lewis, MSW, LCSWA  Clinical Social Work [phone]

## 2012-08-19 ENCOUNTER — Telehealth: Payer: Self-pay | Admitting: General Practice

## 2012-08-19 NOTE — Telephone Encounter (Signed)
LMOM to call office to schedule f/u visit with Dr. Debby Bud.

## 2012-08-19 NOTE — Telephone Encounter (Signed)
Transitional Care Call:  Spoke with pt's son Tammy Sours.  Son had no questions in regard to DC instructions from hospital but did have some concerns regarding pain management for patient.  Son requests pain medication be given scheduled rather than PRN.  Son also has concerns over pt having to urinate so often during the night and is concerned with patient falling.  Recommended that maybe a Bedside toilet would be helpful and pt's son did say that they do have one they can take over.  Other than the request that pain meds be administered around the clock pt had not other questions regarding medications.  I did assure the son I would direct these concerns to Dr. Debby Bud.  Follow up appointment scheduled for Thursday 1/16 @ 3:30.

## 2012-08-25 ENCOUNTER — Ambulatory Visit: Payer: Medicare Other | Admitting: Internal Medicine

## 2012-08-31 ENCOUNTER — Ambulatory Visit: Payer: Medicare Other | Admitting: Internal Medicine

## 2012-09-27 ENCOUNTER — Telehealth: Payer: Self-pay | Admitting: *Deleted

## 2012-09-27 NOTE — Telephone Encounter (Signed)
Ok for PT as outlined

## 2012-09-27 NOTE — Telephone Encounter (Signed)
Denman George, PT at [facility] called stating he did an evaluation on pt and his plan of care for pt is to do PT with pt 3 x a week for 2 weeks and then 2 x a week for 2 weeks to work on safety, ambulation transfers and help her with her left hip pain. He would like on ok to begin PT with patient. Please advise.

## 2012-09-28 ENCOUNTER — Telehealth: Payer: Self-pay | Admitting: *Deleted

## 2012-09-28 NOTE — Telephone Encounter (Signed)
Called Denman George, PT with [facility] [phone], gave the verbal ok to proceed with PT for pt as outlined.

## 2012-10-03 ENCOUNTER — Telehealth: Payer: Self-pay

## 2012-10-03 NOTE — Telephone Encounter (Signed)
Pt's daughter called requesting MD advisement on medication to help improve pt's declining memory, please advise.

## 2012-10-03 NOTE — Telephone Encounter (Signed)
At age 79 I do not hold out much hope that medication will make a big difference.  Plan OK for Namenda - starter pak. Do we send a Rx to her assisted living place or does daughter want to pick it up.

## 2012-10-04 NOTE — Telephone Encounter (Signed)
Left message on machine for pt to return my call  

## 2012-10-06 DIAGNOSIS — E119 Type 2 diabetes mellitus without complications: Secondary | ICD-10-CM

## 2012-10-06 DIAGNOSIS — J45909 Unspecified asthma, uncomplicated: Secondary | ICD-10-CM

## 2012-10-06 DIAGNOSIS — I4891 Unspecified atrial fibrillation: Secondary | ICD-10-CM

## 2012-10-06 DIAGNOSIS — F0390 Unspecified dementia without behavioral disturbance: Secondary | ICD-10-CM

## 2012-10-07 ENCOUNTER — Telehealth: Payer: Self-pay | Admitting: Internal Medicine

## 2012-10-07 ENCOUNTER — Other Ambulatory Visit: Payer: Self-pay | Admitting: Internal Medicine

## 2012-10-07 MED ORDER — TRAMADOL HCL 50 MG PO TABS: 50.0000 mg | ORAL_TABLET | Freq: Three times a day (TID) | ORAL | Status: DC | PRN

## 2012-10-07 NOTE — Telephone Encounter (Signed)
[name] from [facility] is calling to get an order for an in-home x-ray, the patient is having lower oxygen levels during the day and also a cough that she claims in unproductive, also some wheezing, please call [phone]

## 2012-10-07 NOTE — Telephone Encounter (Signed)
Called [name]: patient with O2 sat of 94%, no fever, no purulent sputum, question of upper airway wheezing  Advised: no x-ray, if she appears sick the answering service should be called in AM for appointment in Saturday clinic

## 2012-10-07 NOTE — Telephone Encounter (Signed)
Please read note below and advise.  

## 2012-10-10 ENCOUNTER — Telehealth: Payer: Self-pay | Admitting: Internal Medicine

## 2012-10-10 NOTE — Telephone Encounter (Signed)
Patient Information:  Caller Name: [name]  Phone: [phone]  Patient: [name], [name]  Gender: Female  DOB: Apr 29, 1920  Age: 79 Years  PCP: Illene Regulus (Adults only)  Office Follow Up:  Does the office need to follow up with this patient?: Yes  Instructions For The Office: Son requesting Xray.  Contact Nursing Facility and left message for [facility] to please call us with patient assessment and vital signs.   Symptoms  Reason For Call & Symptoms: Son is calling in about his mother. She is currently a resident at [facility] living. they are requesting a mobile Chest Xray for congestion. Reviewed EPIC, Nursing home requested one on Friday and order was declined and the family did not want to take to Saturday clinic.  Called the Nursing facility  [phone]. Spoke with staff, given [facility] Nurse [phone] and had to leave a message on her phone.  Advised to please contact the office regarding [name] . We need assessment and vital signs in order to better triage patient for need for xray . Provided office number to call back .  Reviewed Health History In EMR: N/A  Reviewed Medications In EMR: N/A  Reviewed Allergies In EMR: N/A  Reviewed Surgeries / Procedures: N/A  Date of Onset of Symptoms: Unknown  Guideline(s) Used:  No Protocol Available - Sick Adult  Disposition Per Guideline:   Discuss with PCP and Callback by Nurse Today  Reason For Disposition Reached:   Nursing judgment  Advice Given:  Call Back If:  New symptoms develop  You become worse.

## 2012-10-11 ENCOUNTER — Encounter (HOSPITAL_COMMUNITY): Payer: Self-pay | Admitting: Emergency Medicine

## 2012-10-11 ENCOUNTER — Emergency Department (HOSPITAL_COMMUNITY): Payer: Medicare Other

## 2012-10-11 ENCOUNTER — Inpatient Hospital Stay (HOSPITAL_COMMUNITY)
Admission: EM | Admit: 2012-10-11 | Discharge: 2012-10-19 | DRG: 202 | Disposition: A | Discharge status: Skilled Nursing Facility | Payer: Medicare Other | Attending: Internal Medicine | Admitting: Internal Medicine

## 2012-10-11 DIAGNOSIS — M1990 Unspecified osteoarthritis, unspecified site: Secondary | ICD-10-CM | POA: Diagnosis present

## 2012-10-11 DIAGNOSIS — I059 Rheumatic mitral valve disease, unspecified: Secondary | ICD-10-CM

## 2012-10-11 DIAGNOSIS — I5033 Acute on chronic diastolic (congestive) heart failure: Secondary | ICD-10-CM

## 2012-10-11 DIAGNOSIS — J45909 Unspecified asthma, uncomplicated: Secondary | ICD-10-CM

## 2012-10-11 DIAGNOSIS — R9431 Abnormal electrocardiogram [ECG] [EKG]: Secondary | ICD-10-CM

## 2012-10-11 DIAGNOSIS — R413 Other amnesia: Secondary | ICD-10-CM

## 2012-10-11 DIAGNOSIS — I340 Nonrheumatic mitral (valve) insufficiency: Secondary | ICD-10-CM

## 2012-10-11 DIAGNOSIS — J9801 Acute bronchospasm: Secondary | ICD-10-CM

## 2012-10-11 DIAGNOSIS — R5381 Other malaise: Secondary | ICD-10-CM

## 2012-10-11 DIAGNOSIS — D649 Anemia, unspecified: Secondary | ICD-10-CM

## 2012-10-11 DIAGNOSIS — I2510 Atherosclerotic heart disease of native coronary artery without angina pectoris: Secondary | ICD-10-CM

## 2012-10-11 DIAGNOSIS — I509 Heart failure, unspecified: Secondary | ICD-10-CM

## 2012-10-11 DIAGNOSIS — Z981 Arthrodesis status: Secondary | ICD-10-CM

## 2012-10-11 DIAGNOSIS — I4891 Unspecified atrial fibrillation: Secondary | ICD-10-CM

## 2012-10-11 DIAGNOSIS — I080 Rheumatic disorders of both mitral and aortic valves: Secondary | ICD-10-CM

## 2012-10-11 DIAGNOSIS — I10 Essential (primary) hypertension: Secondary | ICD-10-CM

## 2012-10-11 DIAGNOSIS — E785 Hyperlipidemia, unspecified: Secondary | ICD-10-CM

## 2012-10-11 DIAGNOSIS — D469 Myelodysplastic syndrome, unspecified: Secondary | ICD-10-CM | POA: Diagnosis present

## 2012-10-11 DIAGNOSIS — J209 Acute bronchitis, unspecified: Principal | ICD-10-CM | POA: Diagnosis present

## 2012-10-11 DIAGNOSIS — Z79899 Other long term (current) drug therapy: Secondary | ICD-10-CM

## 2012-10-11 DIAGNOSIS — E119 Type 2 diabetes mellitus without complications: Secondary | ICD-10-CM

## 2012-10-11 DIAGNOSIS — R5383 Other fatigue: Secondary | ICD-10-CM

## 2012-10-11 DIAGNOSIS — Z794 Long term (current) use of insulin: Secondary | ICD-10-CM

## 2012-10-11 DIAGNOSIS — R0902 Hypoxemia: Secondary | ICD-10-CM

## 2012-10-11 DIAGNOSIS — Z96649 Presence of unspecified artificial hip joint: Secondary | ICD-10-CM

## 2012-10-11 DIAGNOSIS — D509 Iron deficiency anemia, unspecified: Secondary | ICD-10-CM

## 2012-10-11 DIAGNOSIS — B9789 Other viral agents as the cause of diseases classified elsewhere: Secondary | ICD-10-CM | POA: Diagnosis present

## 2012-10-11 DIAGNOSIS — I2789 Other specified pulmonary heart diseases: Secondary | ICD-10-CM | POA: Diagnosis present

## 2012-10-11 DIAGNOSIS — E876 Hypokalemia: Secondary | ICD-10-CM

## 2012-10-11 HISTORY — DX: Chronic diastolic (congestive) heart failure: I50.32

## 2012-10-11 HISTORY — DX: Nonrheumatic mitral (valve) insufficiency: I34.0

## 2012-10-11 HISTORY — DX: Gastrointestinal hemorrhage, unspecified: K92.2

## 2012-10-11 LAB — CBC
HCT: 33.1 % — ABNORMAL LOW (ref 36.0–46.0)
Hemoglobin: 10.5 g/dL — ABNORMAL LOW (ref 12.0–15.0)
MCH: 27.4 pg (ref 26.0–34.0)
MCHC: 31.7 g/dL (ref 30.0–36.0)
MCV: 86.4 fL (ref 78.0–100.0)
Platelets: 144 10*3/uL — ABNORMAL LOW (ref 150–400)
RBC: 3.83 MIL/uL — ABNORMAL LOW (ref 3.87–5.11)
RDW: 15.1 % (ref 11.5–15.5)
WBC: 8.7 10*3/uL (ref 4.0–10.5)

## 2012-10-11 LAB — TROPONIN I
Troponin I: <0.3 ng/mL (ref <0.30)
Troponin I: <0.3 ng/mL (ref <0.30)

## 2012-10-11 LAB — BASIC METABOLIC PANEL
BUN: 51 mg/dL — ABNORMAL HIGH (ref 6–23)
BUN: 50 mg/dL — ABNORMAL HIGH (ref 6–23)
CO2: 31 mEq/L (ref 19–32)
Calcium: 10.1 mg/dL (ref 8.4–10.5)
Chloride: 94 mEq/L — ABNORMAL LOW (ref 96–112)
Chloride: 89 mEq/L — ABNORMAL LOW (ref 96–112)
Creatinine, Ser: 0.97 mg/dL (ref 0.50–1.10)
GFR calc Af Amer: 58 mL/min — ABNORMAL LOW (ref >90)
GFR calc non Af Amer: 50 mL/min — ABNORMAL LOW (ref >90)
Glucose, Bld: 260 mg/dL — ABNORMAL HIGH (ref 70–99)
Glucose, Bld: 339 mg/dL — ABNORMAL HIGH (ref 70–99)
Potassium: 2.6 mEq/L — CL (ref 3.5–5.1)
Potassium: 2.7 mEq/L — CL (ref 3.5–5.1)
Sodium: 140 mEq/L (ref 135–145)

## 2012-10-11 LAB — GLUCOSE, CAPILLARY: Glucose-Capillary: 299 mg/dL — ABNORMAL HIGH (ref 70–99)

## 2012-10-11 LAB — DIGOXIN LEVEL: Digoxin Level: 0.6 ng/mL — ABNORMAL LOW (ref 0.8–2.0)

## 2012-10-11 LAB — PRO B NATRIURETIC PEPTIDE: Pro B Natriuretic peptide (BNP): 1101 pg/mL — ABNORMAL HIGH (ref 0–450)

## 2012-10-11 MED ORDER — DIGOXIN 125 MCG PO TABS
0.1250 mg | ORAL_TABLET | Freq: Every day | ORAL | Status: DC
Start: 2012-10-12 — End: 2012-10-16
  Administered 2012-10-12 – 2012-10-16 (×5): 0.125 mg via ORAL
  Filled 2012-10-11 (×5): qty 1

## 2012-10-11 MED ORDER — POTASSIUM CHLORIDE 20 MEQ PO PACK
40.0000 meq | PACK | Freq: Once | ORAL | Status: DC
  Filled 2012-10-11: qty 2

## 2012-10-11 MED ORDER — POTASSIUM CHLORIDE CRYS ER 20 MEQ PO TBCR
40.0000 meq | EXTENDED_RELEASE_TABLET | Freq: Once | ORAL | Status: AC
  Administered 2012-10-11: 40 meq via ORAL
  Filled 2012-10-11: qty 2

## 2012-10-11 MED ORDER — ASPIRIN 81 MG PO CHEW
324.0000 mg | CHEWABLE_TABLET | Freq: Once | ORAL | Status: AC
  Administered 2012-10-11: 324 mg via ORAL
  Filled 2012-10-11: qty 4

## 2012-10-11 MED ORDER — TRAMADOL HCL 50 MG PO TABS
50.0000 mg | ORAL_TABLET | Freq: Two times a day (BID) | ORAL | Status: DC
  Administered 2012-10-11 – 2012-10-18 (×15): 50 mg via ORAL
  Filled 2012-10-11 (×18): qty 1

## 2012-10-11 MED ORDER — FUROSEMIDE 10 MG/ML IJ SOLN
INTRAMUSCULAR | Status: AC
  Filled 2012-10-11: qty 8

## 2012-10-11 MED ORDER — ASPIRIN EC 81 MG PO TBEC
81.0000 mg | DELAYED_RELEASE_TABLET | Freq: Every day | ORAL | Status: DC
  Administered 2012-10-12 – 2012-10-19 (×8): 81 mg via ORAL
  Filled 2012-10-11 (×9): qty 1

## 2012-10-11 MED ORDER — POTASSIUM CHLORIDE CRYS ER 20 MEQ PO TBCR
10.0000 meq | EXTENDED_RELEASE_TABLET | Freq: Once | ORAL | Status: AC
  Administered 2012-10-11: 10 meq via ORAL
  Filled 2012-10-11: qty 1

## 2012-10-11 MED ORDER — FERROUS SULFATE 325 (65 FE) MG PO TABS
325.0000 mg | ORAL_TABLET | Freq: Every day | ORAL | Status: DC
  Administered 2012-10-12 – 2012-10-19 (×8): 325 mg via ORAL
  Filled 2012-10-11 (×9): qty 1

## 2012-10-11 MED ORDER — TEMAZEPAM 7.5 MG PO CAPS
7.5000 mg | ORAL_CAPSULE | Freq: Every evening | ORAL | Status: DC | PRN
  Administered 2012-10-11 – 2012-10-17 (×6): 7.5 mg via ORAL
  Filled 2012-10-11 (×6): qty 1

## 2012-10-11 MED ORDER — GLUCERNA SHAKE PO LIQD
237.0000 mL | Freq: Three times a day (TID) | ORAL | Status: DC
  Administered 2012-10-11 – 2012-10-18 (×12): 237 mL via ORAL

## 2012-10-11 MED ORDER — ALBUTEROL SULFATE (5 MG/ML) 0.5% IN NEBU: 5.0000 mg | INHALATION_SOLUTION | RESPIRATORY_TRACT | Status: AC | PRN

## 2012-10-11 MED ORDER — AMLODIPINE BESYLATE 5 MG PO TABS
5.0000 mg | ORAL_TABLET | Freq: Every day | ORAL | Status: DC
  Administered 2012-10-11 – 2012-10-19 (×9): 5 mg via ORAL
  Filled 2012-10-11 (×9): qty 1

## 2012-10-11 MED ORDER — SODIUM CHLORIDE 0.9 % IJ SOLN
3.0000 mL | Freq: Two times a day (BID) | INTRAMUSCULAR | Status: DC
  Administered 2012-10-11 – 2012-10-18 (×14): 3 mL via INTRAVENOUS
  Administered 2012-10-18: 22:00:00 via INTRAVENOUS

## 2012-10-11 MED ORDER — SIMVASTATIN 20 MG PO TABS
20.0000 mg | ORAL_TABLET | Freq: Every day | ORAL | Status: DC
  Administered 2012-10-11 – 2012-10-19 (×9): 20 mg via ORAL
  Filled 2012-10-11 (×9): qty 1

## 2012-10-11 MED ORDER — SODIUM CHLORIDE 0.9 % IV SOLN
INTRAVENOUS | Status: DC
  Administered 2012-10-11: 16:00:00 via INTRAVENOUS

## 2012-10-11 MED ORDER — IRBESARTAN 150 MG PO TABS: 150.0000 mg | ORAL_TABLET | Freq: Every day | ORAL | Status: DC

## 2012-10-11 MED ORDER — LORAZEPAM 0.5 MG PO TABS
0.5000 mg | ORAL_TABLET | Freq: Three times a day (TID) | ORAL | Status: DC | PRN
  Administered 2012-10-11 – 2012-10-18 (×3): 0.5 mg via ORAL
  Filled 2012-10-11 (×4): qty 1

## 2012-10-11 MED ORDER — FUROSEMIDE 10 MG/ML IJ SOLN
60.0000 mg | Freq: Two times a day (BID) | INTRAMUSCULAR | Status: DC
  Administered 2012-10-11 – 2012-10-14 (×6): 60 mg via INTRAVENOUS
  Filled 2012-10-11 (×7): qty 6

## 2012-10-11 MED ORDER — INSULIN GLARGINE 100 UNIT/ML ~~LOC~~ SOLN
17.0000 [IU] | Freq: Every day | SUBCUTANEOUS | Status: DC
  Administered 2012-10-11 – 2012-10-15 (×5): 17 [IU] via SUBCUTANEOUS

## 2012-10-11 MED ORDER — POTASSIUM CHLORIDE 20 MEQ/15ML (10%) PO LIQD
40.0000 meq | Freq: Once | ORAL | Status: AC
  Administered 2012-10-11: 40 meq via ORAL
  Filled 2012-10-11: qty 30

## 2012-10-11 MED ORDER — ONDANSETRON HCL 4 MG/2ML IJ SOLN: 4.0000 mg | Freq: Three times a day (TID) | INTRAMUSCULAR | Status: AC | PRN

## 2012-10-11 MED ORDER — POTASSIUM CHLORIDE 10 MEQ/100ML IV SOLN
10.0000 meq | Freq: Once | INTRAVENOUS | Status: AC
  Administered 2012-10-11: 10 meq via INTRAVENOUS
  Filled 2012-10-11: qty 100

## 2012-10-11 MED ORDER — LEVOFLOXACIN IN D5W 750 MG/150ML IV SOLN
750.0000 mg | Freq: Once | INTRAVENOUS | Status: AC
  Administered 2012-10-11: 750 mg via INTRAVENOUS
  Filled 2012-10-11: qty 150

## 2012-10-11 MED ORDER — ENOXAPARIN SODIUM 30 MG/0.3ML ~~LOC~~ SOLN
30.0000 mg | SUBCUTANEOUS | Status: DC
  Administered 2012-10-11 – 2012-10-18 (×7): 30 mg via SUBCUTANEOUS
  Filled 2012-10-11 (×9): qty 0.3

## 2012-10-11 MED ORDER — LEVOFLOXACIN 750 MG PO TABS
750.0000 mg | ORAL_TABLET | Freq: Every day | ORAL | Status: DC
  Filled 2012-10-11: qty 1

## 2012-10-11 MED ORDER — INSULIN ASPART 100 UNIT/ML ~~LOC~~ SOLN
0.0000 [IU] | Freq: Three times a day (TID) | SUBCUTANEOUS | Status: DC
  Administered 2012-10-11: 7 [IU] via SUBCUTANEOUS
  Administered 2012-10-12: 2 [IU] via SUBCUTANEOUS
  Administered 2012-10-12: 9 [IU] via SUBCUTANEOUS
  Administered 2012-10-12: 2 [IU] via SUBCUTANEOUS
  Administered 2012-10-13: 7 [IU] via SUBCUTANEOUS
  Administered 2012-10-13: 3 [IU] via SUBCUTANEOUS
  Administered 2012-10-13: 5 [IU] via SUBCUTANEOUS
  Administered 2012-10-14: 3 [IU] via SUBCUTANEOUS
  Administered 2012-10-15: 7 [IU] via SUBCUTANEOUS
  Administered 2012-10-15: 5 [IU] via SUBCUTANEOUS
  Administered 2012-10-15: 1 [IU] via SUBCUTANEOUS
  Administered 2012-10-16 (×2): 5 [IU] via SUBCUTANEOUS
  Administered 2012-10-17: 9 [IU] via SUBCUTANEOUS
  Administered 2012-10-17: 3 [IU] via SUBCUTANEOUS
  Administered 2012-10-18: 7 [IU] via SUBCUTANEOUS
  Administered 2012-10-18: 9 [IU] via SUBCUTANEOUS
  Administered 2012-10-19: 3 [IU] via SUBCUTANEOUS

## 2012-10-11 MED ORDER — HYDROCOD POLST-CHLORPHEN POLST 10-8 MG/5ML PO LQCR
5.0000 mL | Freq: Two times a day (BID) | ORAL | Status: DC | PRN
  Administered 2012-10-11 – 2012-10-19 (×9): 5 mL via ORAL
  Filled 2012-10-11 (×9): qty 5

## 2012-10-11 MED ORDER — ACETAMINOPHEN 325 MG PO TABS
650.0000 mg | ORAL_TABLET | ORAL | Status: DC | PRN
  Administered 2012-10-11 – 2012-10-17 (×4): 650 mg via ORAL
  Filled 2012-10-11 (×4): qty 2

## 2012-10-11 NOTE — ED Notes (Signed)
Pt denies pain currently; pt alert and mentating appropriately; pt speaking in clear sentences; pt denies needs at this time.

## 2012-10-11 NOTE — ED Notes (Signed)
Dr. Juleen China at bedside; pt states she feels bad all over; pt states she feels bad since she has been moving around; aching on her side; pt states she is a little short of breath; pt denies fever and chills; per Dr Marylen Ponto order placed pt back on treatment that was started in EMS

## 2012-10-11 NOTE — ED Provider Notes (Signed)
History    79 year old female presenting for general malaise. Gradual onset 3-4 days ago. Patient states she feels "miserable" and "achy" all over. Nonproductive cough. Denies chest pain. Mild shortness of breath. No fevers or chills. No unusual leg pain or swelling. Mild nausea, but no vomiting. Patient received Solu-Medrol and albuterol/Atrovent breathing treatment prior to arrival by EMS. Patient reports symptoms improved she'll he after starting new treatment. Patient has a past history significant for coronary artery disease , atrial fibrillation, congestive heart failure.  CSN: [id number]  Arrival date & time 10/11/12  1126   First MD Initiated Contact with Patient 10/11/12 1139      Chief Complaint  Patient presents with  . Cough    (Consider location/radiation/quality/duration/timing/severity/associated sxs/prior treatment) HPI  Past Medical History  Diagnosis Date  . Inguinal hernia   . Mitral valve insufficiency and aortic valve insufficiency   . Other and unspecified hyperlipidemia   . Pneumonia   . Osteoarthrosis, unspecified whether generalized or localized, unspecified site   . Nephrolithiasis   . Unspecified essential hypertension   . Type II or unspecified type diabetes mellitus without mention of complication, not stated as uncontrolled   . Coronary atherosclerosis of unspecified type of vessel, native or graft   . Congestive heart failure, unspecified   . Atrial fibrillation   . Unspecified asthma   . Anemia, unspecified   . Allergic rhinitis, cause unspecified   . Colon polyps 10/2009    Past Surgical History  Procedure Laterality Date  . Breast cyst aspiration      x50  . Abdominal hysterectomy  1990  . Lumbar fusion  2007    [facility]  . Total hip arthroplasty  01/2007    Right - Dr Valentina Gu  . Inguinal hernia repair  2010    [facility]    Family History  Problem Relation Age of Onset  . Heart disease Father     CHF  . Lymphoma Sister   . Colon  cancer Neg Hx     History  Substance Use Topics  . Smoking status: Never Smoker   . Smokeless tobacco: Never Used  . Alcohol Use: No    OB History   Grav Para Term Preterm Abortions TAB SAB Ect Mult Living                  Review of Systems  All systems reviewed and negative, other than as noted in HPI.   Allergies  Codeine  Home Medications   Current Outpatient Rx  Name  Route  Sig  Dispense  Refill  . acetaminophen (TYLENOL) 500 MG tablet   Oral   Take 1,000 mg by mouth 2 (two) times daily. For hip pain         . Cholecalciferol (VITAMIN D3) 1000 UNITS CAPS   Oral   Take by mouth daily.         . clonazePAM (KLONOPIN) 0.5 MG tablet   Oral   Take 0.5 mg by mouth 2 (two) times daily as needed. For anxiety         . digoxin (LANOXIN) 0.125 MG tablet   Oral   Take 0.125 mg by mouth daily.         Marland Kitchen doxazosin (CARDURA) 4 MG tablet   Oral   Take 4 mg by mouth at bedtime.         . feeding supplement (GLUCERNA SHAKE) LIQD   Oral   Take 237 mLs by mouth 3 (three) times  daily between meals.         . FeFum-FePoly-FA-B Cmp-C-Biot (INTEGRA PLUS) CAPS      TAKE ONE CAPSULE BY MOUTH EVERY DAY.   30 each   1   . ferrous sulfate 325 (65 FE) MG tablet   Oral   Take 325 mg by mouth daily with breakfast.         . fexofenadine (ALLEGRA) 180 MG tablet   Oral   Take 180 mg by mouth daily.         . furosemide (LASIX) 40 MG tablet   Oral   Take 1.5 tablets (60 mg total) by mouth 2 (two) times daily.   90 tablet   6   . glucose blood test strip      Test 2 (two) times daily. DX 250.00   100 each   11   . HYDROcodone-acetaminophen (NORCO/VICODIN) 5-325 MG per tablet               . insulin glargine (LANTUS) 100 UNIT/ML injection   Subcutaneous   Inject 34 Units into the skin at bedtime.          . irbesartan (AVAPRO) 300 MG tablet   Oral   Take 150 mg by mouth daily.          . metFORMIN (GLUCOPHAGE) 500 MG tablet   Oral    Take 1 tablet (500 mg total) by mouth 2 (two) times daily. Take 500 mg by mouth 2 (two) times daily with a meal.   60 tablet   11   . metoCLOPramide (REGLAN) 5 MG tablet   Oral   Take 5 mg by mouth 2 (two) times daily.         . metolazone (ZAROXOLYN) 2.5 MG tablet      TAKE ONE TABLET BY MOUTH IN THE MORNING ON MONDAY, WEDNESDAY, AND FRIDAY 30 MINUTES BEFORE TAKING FUROSEMIDE   30 tablet   2   . potassium chloride SA (K-DUR,KLOR-CON) 20 MEQ tablet   Oral   Take 20 mEq by mouth 3 (three) times daily.         . promethazine (PHENERGAN) 12.5 MG tablet      TAKE ONE TABLET BY MOUTH EVERY 6 HOURS AS NEEDED FOR NAUSEA   30 tablet   0   . RELION MINI PEN NEEDLES 31G X 6 MM MISC      1 Container.         . simvastatin (ZOCOR) 20 MG tablet   Oral   Take 20 mg by mouth daily.          . temazepam (RESTORIL) 15 MG capsule      TAKE ONE CAPSULE BY MOUTH AT BEDTIME   30 capsule   5   . traMADol (ULTRAM) 50 MG tablet   Oral   Take 1 tablet (50 mg total) by mouth every 8 (eight) hours as needed for pain.   90 tablet   3     BP 153/35  Pulse 96  Temp(Src) 97.6 F (36.4 C) (Oral)  Resp 25  SpO2 96%  Physical Exam  Nursing note and vitals reviewed. Constitutional: She appears well-developed and well-nourished. No distress.  HENT:  Head: Normocephalic and atraumatic.  Eyes: Conjunctivae are normal. Right eye exhibits no discharge. Left eye exhibits no discharge.  Neck: Neck supple.  Cardiovascular: Normal rate, regular rhythm and normal heart sounds.  Exam reveals no gallop and no friction rub.   No murmur heard.  Pulmonary/Chest: Effort normal. No respiratory distress. She has wheezes.  Course wheezing bilaterally. Mild tachypnea  Abdominal: Soft. She exhibits no distension. There is no tenderness.  Musculoskeletal: She exhibits no edema and no tenderness.  Lower extremities symmetric as compared to each other. No calf tenderness. Negative Homan's. No palpable  cords.   Neurological: She is alert.  Skin: Skin is warm and dry. She is not diaphoretic.  Psychiatric: She has a normal mood and affect. Her behavior is normal. Thought content normal.    ED Course  Procedures (including critical care time)  Labs Reviewed  CBC - Abnormal; Notable for the following:    RBC 3.83 (*)    Hemoglobin 10.5 (*)    HCT 33.1 (*)    Platelets 144 (*)    All other components within normal limits  BASIC METABOLIC PANEL - Abnormal; Notable for the following:    Potassium 2.6 (*)    Chloride 94 (*)    Glucose, Bld 260 (*)    BUN 51 (*)    GFR calc non Af Amer 50 (*)    GFR calc Af Amer 58 (*)    All other components within normal limits  PRO B NATRIURETIC PEPTIDE - Abnormal; Notable for the following:    Pro B Natriuretic peptide (BNP) 1101.0 (*)    All other components within normal limits  DIGOXIN LEVEL - Abnormal; Notable for the following:    Digoxin Level 0.6 (*)    All other components within normal limits  TROPONIN I   Dg Chest 2 View  10/11/2012  *RADIOLOGY REPORT*  Clinical Data: Cough, tachycardia  CHEST - 2 VIEW  Comparison: 08/15/2012  Findings: Cardiomegaly again noted.  There is soft tissue prominence in the upper mediastinum stable from prior exam. Retrosternal goiter cannot be excluded.  Clinical correlation is necessary.  No acute infiltrate or pleural effusion.  No pulmonary edema. Mild thoracic spine osteopenia.  IMPRESSION: No active disease.  Cardiomegaly again noted.  No significant change.  Mild thoracic spine osteopenia.   Original Report Authenticated By: Natasha Mead, M.D.     EKG:  Rhythm: atrial fibrillation Vent. rate 107 BPM PR interval * ms QRS duration 108 ms QT/QTc 316/421 ms ST segments: STE in aVR and V1. ST depression inferior and lateral precordial leads.   1. Hypoxemia   2. Bronchospasm   3. Abnormal EKG   4. Hypokalemia       MDM  79 year old female with cough and shortness of breath. Patient hypoxic at 90%  on room air. She has wheezing bilaterally. Patient's EKG is markedly abnormal. Atrial fibrillation which he has a history of. She has ischemic changes in the inferior and lateral leads precordial leads. She has greater than 1 mm of ST segment elevation in aVR and V1. This is a change from priors.  Pt continues to deny CP though. She was given ASA. Have reasonable alternative explanation for her hypoxia and without complaints of CP, I deferred from consulting cardiology emergently. Although pt's CXR is without focal infiltrate, she was given dose of levaquin for possible occult pneumonia. Hypokalemia. Replete. Discussed with hospitalist for admission.         Raeford Razor, MD 10/11/12 [phone]

## 2012-10-11 NOTE — ED Notes (Signed)
Pt denies pain; pt states "I just feel miserable." Pt denies chest pain and difficulty breathing; pt alert and mentating appropriately.

## 2012-10-11 NOTE — ED Notes (Signed)
Dr Wendy Poet at bedside with [name]; [name] states her potassium was burning, lowered the rate to 60 ml/hr at Dr. Charlean Sanfilippo order

## 2012-10-11 NOTE — H&P (Addendum)
[facility] History and Physical  [name] HQI:[id number] DOB: [birth year] DOA: 10/11/2012  Referring physician: Dr. Juleen China PCP: Illene Regulus, MD  Specialists: Cardiology, [place]  Chief Complaint: Weakness  HPI: [name] is a 79 y.o. female has a past medical history significant for severe three-vessel disease, diagnosed with a cardiac cath about 3 years ago, followed by [facility] cardiology (Dr. Eden Emms), also a history of severe mitral regurgitation and congestive heart failure, atrial fibrillation not currently anticoagulated, chronic multifactorial anemia followed by oncology presents with weakness for the past 3 days.  She currently resides at [facility] place and has been there for the past 3 weeks. She denies any chest pain, however endorses  feeling very fatigued for the past 3 days and having breathing difficulties. She denies any lightheadedness or dizziness. She denies any vomiting or diarrhea but endorses mild nausea. Her oxygen saturations were low at the nursing facility, and she requires no 2 L cannula, and appears comfortable on that. Dr. Arthur Holms is her PCP and has been trying to arrange a chest x-ray at the nursing facility. Today patient felt significantly worse, and the decision was made to bring her to the emergency room. Here, the chest x-ray is negative for acute infiltrates or pleural effusions. She does have however significant hypokalemia 2.6. This has been a chronic problem for her, and as an outpatient, she is on potassium supplements 60 mEq daily. She reports that she has been taking all of her medications as scheduled at the nursing facility. She has no fevers or chills.  Review of Systems: As per history of present illness, otherwise negative.  Past Medical History  Diagnosis Date  . Inguinal hernia   . Mitral valve insufficiency and aortic valve insufficiency   . Other and unspecified hyperlipidemia   . Pneumonia   . Osteoarthrosis, unspecified  whether generalized or localized, unspecified site   . Nephrolithiasis   . Unspecified essential hypertension   . Type II or unspecified type diabetes mellitus without mention of complication, not stated as uncontrolled   . Coronary atherosclerosis of unspecified type of vessel, native or graft   . Congestive heart failure, unspecified   . Atrial fibrillation   . Unspecified asthma   . Anemia, unspecified   . Allergic rhinitis, cause unspecified   . Colon polyps 10/2009   Past Surgical History  Procedure Laterality Date  . Breast cyst aspiration      x50  . Abdominal hysterectomy  1990  . Lumbar fusion  2007    [facility]  . Total hip arthroplasty  01/2007    Right - Dr Valentina Gu  . Inguinal hernia repair  2010    [facility]   Social History:  reports that she has never smoked. She has never used smokeless tobacco. She reports that she does not drink alcohol or use illicit drugs.  Allergies  Allergen Reactions  . Codeine     REACTION: Hives    Family History  Problem Relation Age of Onset  . Heart disease Father     CHF  . Lymphoma Sister   . Colon cancer Neg Hx     Prior to Admission medications   Medication Sig Start Date End Date Taking? Authorizing Farhad Burleson  acetaminophen (TYLENOL) 500 MG tablet Take 1,000 mg by mouth 2 (two) times daily. For hip pain   Yes Historical Pamlea Finder, MD  Cholecalciferol (VITAMIN D3) 1000 UNITS CAPS Take 1,000 Units by mouth daily.    Yes Historical Glenetta Kiger, MD  clonazePAM Scarlette Calico)  0.5 MG tablet Take 0.5 mg by mouth 2 (two) times daily as needed. For anxiety   Yes Historical Greenley Martone, MD  digoxin (LANOXIN) 0.125 MG tablet Take 0.125 mg by mouth daily.   Yes Historical Khali Perella, MD  docusate sodium (COLACE) 100 MG capsule Take 100 mg by mouth daily.   Yes Historical Shamanda Len, MD  doxazosin (CARDURA) 4 MG tablet Take 4 mg by mouth at bedtime.   Yes Historical Damiah Mcdonald, MD  FeFum-FePoly-FA-B Cmp-C-Biot (INTEGRA PLUS) CAPS TAKE ONE CAPSULE BY MOUTH  EVERY DAY. 07/18/12  Yes Si Gaul, MD  ferrous sulfate 325 (65 FE) MG tablet Take 325 mg by mouth daily with breakfast.   Yes Historical Reonna Finlayson, MD  fexofenadine (ALLEGRA) 180 MG tablet Take 180 mg by mouth daily.   Yes Historical Alece Koppel, MD  furosemide (LASIX) 40 MG tablet Take 1.5 tablets (60 mg total) by mouth 2 (two) times daily. 06/21/12  Yes Beatrice Lecher, PA  HYDROcodone-acetaminophen (NORCO/VICODIN) 5-325 MG per tablet Take 1 tablet by mouth every 6 (six) hours as needed for pain.  06/06/12  Yes Jacques Navy, MD  insulin glargine (LANTUS) 100 UNIT/ML injection Inject 34 Units into the skin at bedtime.  07/03/12  Yes Jacques Navy, MD  irbesartan (AVAPRO) 300 MG tablet Take 150 mg by mouth daily.    Yes Historical Tehilla Coffel, MD  metFORMIN (GLUCOPHAGE) 500 MG tablet Take 1 tablet (500 mg total) by mouth 2 (two) times daily. Take 500 mg by mouth 2 (two) times daily with a meal. 06/30/12  Yes Jacques Navy, MD  metoCLOPramide (REGLAN) 5 MG tablet Take 5 mg by mouth 2 (two) times daily. 12/17/11  Yes Jacques Navy, MD  metolazone (ZAROXOLYN) 2.5 MG tablet Take 2.5 mg by mouth every Monday, Wednesday, and Friday. 30 minutes before Lasix   Yes Historical Ahaan Zobrist, MD  potassium chloride SA (K-DUR,KLOR-CON) 20 MEQ tablet Take 20 mEq by mouth 3 (three) times daily.   Yes Historical Rohn Fritsch, MD  promethazine (PHENERGAN) 12.5 MG tablet TAKE ONE TABLET BY MOUTH EVERY 6 HOURS AS NEEDED FOR NAUSEA 06/06/12  Yes Jacques Navy, MD  senna-docusate (SENOKOT-S) 8.6-50 MG per tablet Take 1 tablet by mouth at bedtime.   Yes Historical Aleaha Fickling, MD  simvastatin (ZOCOR) 20 MG tablet Take 20 mg by mouth daily.  03/31/12  Yes Historical Garrus Gauthreaux, MD  temazepam (RESTORIL) 15 MG capsule TAKE ONE CAPSULE BY MOUTH AT BEDTIME 04/20/12  Yes Jacques Navy, MD  traMADol (ULTRAM) 50 MG tablet Take 50 mg by mouth 2 (two) times daily.   Yes Historical Ryder Chesmore, MD  feeding supplement (GLUCERNA SHAKE)  LIQD Take 237 mLs by mouth 3 (three) times daily between meals. 08/18/12   Jacques Navy, MD  glucose blood test strip Test 2 (two) times daily. DX 250.00 01/21/12   Jacques Navy, MD  RELION MINI PEN NEEDLES 31G X 6 MM MISC 1 Container. 02/25/12   Historical Avir Deruiter, MD   Physical Exam: Filed Vitals:   10/11/12 1134 10/11/12 1233 10/11/12 1355  BP: 153/35 141/57 134/96  Pulse: 96 97 90  Temp: 97.6 F (36.4 C)    TempSrc: Oral    Resp: 25 17 26   SpO2: 96% 93% 96%     General:  Frail Caucasian female in mild distress.  Eyes: PERRL, EOMI  ENT: moist oropharynx  Neck: supple, jugular venous pulsations present  Cardiovascular: Irregular rate  Respiratory: Bilateral coarse breath sounds at the bases, no wheezing.  Abdomen: soft,  non tender to palpation, positive bowel sounds, no guarding, no rebound  Skin: no rashes  Musculoskeletal: no peripheral edema  Psychiatric: Appears anxious.  Neurologic: CN 2-12 grossly intact, MS 5/5 in all 4  Labs on Admission:  Basic Metabolic Panel:  Recent Labs Lab 10/11/12 1140  NA 140  K 2.6*  CL 94*  CO2 31  GLUCOSE 260*  BUN 51*  CREATININE 0.97  CALCIUM 10.1   CBC:  Recent Labs Lab 10/11/12 1140  WBC 8.7  HGB 10.5*  HCT 33.1*  MCV 86.4  PLT 144*   Cardiac Enzymes:  Recent Labs Lab 10/11/12 1140  TROPONINI <0.30    BNP (last 3 results)  Recent Labs  01/20/12 1727 08/15/12 1338 10/11/12 1140  PROBNP 489.0* 306.6 1101.0*   Radiological Exams on Admission: Dg Chest 2 View  10/11/2012  *RADIOLOGY REPORT*  Clinical Data: Cough, tachycardia  CHEST - 2 VIEW  Comparison: 08/15/2012  Findings: Cardiomegaly again noted.  There is soft tissue prominence in the upper mediastinum stable from prior exam. Retrosternal goiter cannot be excluded.  Clinical correlation is necessary.  No acute infiltrate or pleural effusion.  No pulmonary edema. Mild thoracic spine osteopenia.  IMPRESSION: No active disease.   Cardiomegaly again noted.  No significant change.  Mild thoracic spine osteopenia.   Original Report Authenticated By: Natasha Mead, M.D.    EKG: Independently reviewed. ST depressions in the 2 and 3 aVF V5 and V6  Assessment/Plan Active Problems:   DIABETES MELLITUS, TYPE II   HYPERTENSION   CORONARY ARTERY DISEASE   Atrial fibrillation   CONGESTIVE HEART FAILURE   Iron deficiency anemia   EKG, abnormal  1. Hypokalemia - this is been an ongoing problem for her, it is unclear as to why she is so hypokalemic today, it may be related to her diuretics. She appears mildly intravascularly depleted, with her hemoglobin higher than her average, and high BUN over creatinine ratio. She received 60 mEq potassium in the emergency room. Will monitor BMPs and repeat EKG once her potassium level is better. 2. Coronary artery disease - patient has advanced three-vessel disease. There was a discussion in the past about heart surgery, however she is an extremely poor candidate. It appears that the patient was willing to take the risk, but cardiology understandably could not offer surgical clearance. Her EKG looks very concerning, there are some features of hypokalemia induced changes but I am concerned of true ischemic changes. Therefore cardiology had been consulted, appreciate their input. I put in for a 2-D echo. Cycle cardiac enzymes. Admitted on telemetry. 3. Shortness of breath - with mild cough, might represent a bronchitis. She already received Levaquin in the emergency room. 4. Anemia - her hemoglobin this presentation is 10, significantly higher than her baseline of 7.5, suggesting somewhat dehydration. 5. Chronic congestive heart failure - may have an acute component with the BNP elevation. 6. Goals of care - patient is very insistent that she remains full code and we proceeded all the measures that we have offered. This has been discussed extensively in the past per family present in the room. Her son  who is in the room, agrees with the fact that she should be DNR and he has tried to address this with the patient, however the patient insists that she is full code. 7. Diabetes - restarted her Lantus at a lower dose from what she is getting at home, and sliding scale insulin. Last hemoglobin A1c in November of 2013 is 7.0,  which is excellent given her age. 8. Prophylaxis - Lovenox subcutaneous  Code Status: Full  Family Communication: Son in the room  Disposition Plan: To be determined  Time spent: 65 minutes  Costin M. Elvera Lennox, MD [facility] Pager [phone]  If 7PM-7AM, please contact night-coverage [email or web address] Password [facility] 10/11/2012, 3:25 PM

## 2012-10-11 NOTE — ED Notes (Signed)
Per EMS: since 28th has had productive cough; pt from nursing home; unable to tell what color productive cough has been; hx of asthma; 5mg  albuterol en route and second treatment 5mg  and 0.5mg  Atrovent and 125 solumedrol en route; pt reported relief with treatments; pt was suppose to go to urgent care for productive cough but states she was wheezing today and felt needed to come to ED; denies chest pain and shortness of breath, complains more of cough. BP 150/60 HR 98 RR 20. 20G left wrist.

## 2012-10-11 NOTE — Telephone Encounter (Signed)
If sick enough for an x-ray than sick enough to be seen and have a quality x-ray here read by our radiologist and in the system. Can add on today.

## 2012-10-11 NOTE — ED Notes (Signed)
Pt being transported to floor by June Leap, EMT

## 2012-10-11 NOTE — ED Notes (Signed)
Requested to have Dr Elvera Lennox paged to phone to ask about giving 81 mg aspirin again after pt received 324 aspirin earlier

## 2012-10-11 NOTE — Consult Note (Signed)
CARDIOLOGY CONSULT NOTE  Patient ID: [name] MRN: [id number], DOB/AGE: 1919-10-04   Admit date: 10/11/2012 Date of Consult: 10/11/2012   Primary Physician: Illene Regulus, MD Primary Cardiologist: P. Eden Emms, MD  Pt. Profile  79 y/o female with h/o sev 3VD and severe MR, who has been medically managed, who presented on tx from snf today 2/2 chest congestion and tightness.  Problem List  Past Medical History  Diagnosis Date  . Inguinal hernia   . Severe mitral regurgitation     a. 12/2011 Echo: Sev MR with flail leaflet.  . Other and unspecified hyperlipidemia   . Pneumonia   . Osteoarthrosis, unspecified whether generalized or localized, unspecified site   . Nephrolithiasis   . Unspecified essential hypertension   . Type II or unspecified type diabetes mellitus without mention of complication, not stated as uncontrolled   . Coronary atherosclerosis of unspecified type of vessel, native or graft     a. 11/2007 Cath: 3vd, sev MR ->felt to be poor surgical candidate->Med Rx  . Chronic diastolic CHF (congestive heart failure)     a. 12/2011 Echo: EF 60-65%, No rwma, Mild AI, Sev MR w/ flail segment of ant mv leflet, massive bi-atrial enlargement, Mod TR, PASP 86.  . Atrial fibrillation     a. chronic - no longer on anticoagulation.  Marland Kitchen Unspecified asthma   . Anemia, unspecified   . Allergic rhinitis, cause unspecified   . Colon polyps 10/2009  . GIB (gastrointestinal bleeding)     2009    Past Surgical History  Procedure Laterality Date  . Breast cyst aspiration      x50  . Abdominal hysterectomy  1990  . Lumbar fusion  2007    [facility]  . Total hip arthroplasty  01/2007    Right - Dr Valentina Gu  . Inguinal hernia repair  2010    [facility]    Allergies  Allergies  Allergen Reactions  . Codeine     REACTION: Hives   HPI   79 y/o female with the above problem list.  She is s/p cath in 2009 revealing severe 3vd and sev MR.  At that time, she was anemic in the setting  of gib and was not felt to be a surgical candidate.  She has been managed over the years by Dr. Debby Bud.  She last saw Dr. Eden Emms in our office in 2012 and was last seen by Wende Mott in 12/2011.  She was admitted in January with dehydration.    Since last Thursday, she has been experiencing constant chest congestion and upper respiratory tightness associated with cough, wkns, malaise.  There was some concern that she might have bronchitis and her PCP was alerted.  Due to persistence of Ss, she was taken from snf to ED today.  Here, cxr in non-acute, CE are negative, K is 2.6, and ecg shows significant LVH with deep inferolateral st depression.  We've been asked to eval.  Inpatient Medications  . aspirin EC  81 mg Oral Daily  . [START ON 10/12/2012] digoxin  0.125 mg Oral Daily  . enoxaparin (LOVENOX) injection  30 mg Subcutaneous Q24H  . feeding supplement  237 mL Oral TID BM  . [START ON 10/12/2012] ferrous sulfate  325 mg Oral Q breakfast  . insulin aspart  0-9 Units Subcutaneous TID WC  . insulin glargine  17 Units Subcutaneous QHS  . [START ON 10/12/2012] irbesartan  150 mg Oral Daily  . [START ON 10/12/2012] levofloxacin  750  mg Oral Daily  . simvastatin  20 mg Oral Daily  . sodium chloride  3 mL Intravenous Q12H  . traMADol  50 mg Oral BID   Family History Family History  Problem Relation Age of Onset  . Heart disease Father     CHF  . Lymphoma Sister   . Colon cancer Neg Hx     Social History History   Social History  . Marital Status: Widowed    Spouse Name: N/A    Number of Children: N/A  . Years of Education: N/A   Occupational History  . Not on file.   Social History Main Topics  . Smoking status: Never Smoker   . Smokeless tobacco: Never Used  . Alcohol Use: No  . Drug Use: No  . Sexually Active: No   Other Topics Concern  . Not on file   Social History Narrative   Married '42 - 38 years - Widowed   1 son - '50, 1 dtr - '46   4 grandchildren   Regular Exercise -   NO      Lives in [place].      End of life issues: no prolonged mechanical vent. Suport. Laymen's guide to death with dignity provided with a suggestion that she discuss these issues with her family. Her son was supportive of having this discussion.    Review of Systems  General:  No chills, fever, night sweats or weight changes.  Cardiovascular:  +++ constant chest tightness with dyspnea.  No edema, orthopnea, palpitations, paroxysmal nocturnal dyspnea. Dermatological: No rash, lesions/masses Respiratory: No cough, dyspnea Urologic: No hematuria, dysuria Abdominal:   No nausea, vomiting, diarrhea, bright red blood per rectum, melena, or hematemesis Neurologic:  No visual changes, wkns, changes in mental status. All other systems reviewed and are otherwise negative except as noted above.  Physical Exam  Blood pressure 152/51, pulse 72, temperature 98.3 F (36.8 C), temperature source Oral, resp. rate 20, height 5\' 1"  (1.549 m), weight 117 lb 15.1 oz (53.5 kg), SpO2 97.00%.  General: Pleasant, NAD, frail Psych: Normal affect. Neuro: Alert and oriented X 3. Moves all extremities spontaneously. HEENT: Normal  Neck: Supple without bruits.  JVP 14 cm Lungs:  Resp regular and unlabored, CTA. Heart: RRR no s3, s4, 3/6 HSM at apex Abdomen: Soft, non-tender, non-distended, BS + x 4.  Extremities: No clubbing, cyanosis or edema. DP/PT/Radials 2+ and equal bilaterally.  Labs   Recent Labs  10/11/12 1140 10/11/12 1538  TROPONINI <0.30 <0.30   Lab Results  Component Value Date   WBC 8.7 10/11/2012   HGB 10.5* 10/11/2012   HCT 33.1* 10/11/2012   MCV 86.4 10/11/2012   PLT 144* 10/11/2012     Recent Labs Lab 10/11/12 1538  NA 133*  K 2.7*  CL 89*  CO2 28  BUN 50*  CREATININE 0.93  CALCIUM 10.1  GLUCOSE 339*    Radiology/Studies  Dg Chest 2 View  10/11/2012  *RADIOLOGY REPORT*  Clinical Data: Cough, tachycardia  CHEST - 2 VIEW  Comparison: 08/15/2012  Findings: Cardiomegaly again  noted.  There is soft tissue prominence in the upper mediastinum stable from prior exam. Retrosternal goiter cannot be excluded.  Clinical correlation is necessary.  No acute infiltrate or pleural effusion.  No pulmonary edema. Mild thoracic spine osteopenia.  IMPRESSION: No active disease.  Cardiomegaly again noted.  No significant change.  Mild thoracic spine osteopenia.   Original Report Authenticated By: Natasha Mead, M.D.    ECG  Afib, 107, LVH with deep inflat st dep  ASSESSMENT AND PLAN  See below.   Signed, Nicolasa Ducking, NP 10/11/2012, 5:24 PM  Patient seen with NP, agree with the above note.  1. CHF: Acute on chronic diastolic CHF in the setting of severe MR and severe pulmonary HTN.  JVP elevated.  Patient with symptoms of "congestion" and weakness.  BUN high, but has been high in the past and likely a component of cardiorenal-related renal insufficiency.  She may additionally have a viral URI leading to her cough, etc.  No PNA on CXR.  - Would stop IV fluid.  - Gentle diuresis with Lasix 60 mg IV bid.  Replete KCl.  2. CAD: History of moderate 3 vessel disease.  She had several days of continuous chest tightness that may have been related to volume overload or bronchitis.  This has now resolved.  Cardiac enzymes are negative.  However, she does have new diffuse ST depression on ECG.  Continue statin and aspirin.  Would manage conservatively.  3. Mitral regurgitation: Severe on prior echo.  Murmur sounds severe.  Pulmonary HTN is likely a consequence of long-standing severe MR.  Not a surgical candidate.  4. Atrial fibrillation: Chronic, not coumadin candidate due to history of GI bleeding and fall risk.  She has been on digoxin.  Level this admission was ok.  5. Elevated BUN: Chronically elevated, somewhat worse that prior today.  She does appear volume overloaded on exam.  Suspect there may be cardiorenal physiology with elevation in BUN/creatinine.  As above, will gently diurese  and follow renal fxn carefully.   Marca Ancona 10/11/2012 5:35 PM

## 2012-10-12 DIAGNOSIS — I359 Nonrheumatic aortic valve disorder, unspecified: Secondary | ICD-10-CM

## 2012-10-12 DIAGNOSIS — R0902 Hypoxemia: Secondary | ICD-10-CM

## 2012-10-12 DIAGNOSIS — R9431 Abnormal electrocardiogram [ECG] [EKG]: Secondary | ICD-10-CM

## 2012-10-12 DIAGNOSIS — J45909 Unspecified asthma, uncomplicated: Secondary | ICD-10-CM

## 2012-10-12 DIAGNOSIS — E119 Type 2 diabetes mellitus without complications: Secondary | ICD-10-CM

## 2012-10-12 DIAGNOSIS — I2510 Atherosclerotic heart disease of native coronary artery without angina pectoris: Secondary | ICD-10-CM

## 2012-10-12 DIAGNOSIS — D649 Anemia, unspecified: Secondary | ICD-10-CM

## 2012-10-12 LAB — GLUCOSE, CAPILLARY
Glucose-Capillary: 160 mg/dL — ABNORMAL HIGH (ref 70–99)
Glucose-Capillary: 196 mg/dL — ABNORMAL HIGH (ref 70–99)
Glucose-Capillary: 387 mg/dL — ABNORMAL HIGH (ref 70–99)

## 2012-10-12 LAB — BASIC METABOLIC PANEL
BUN: 56 mg/dL — ABNORMAL HIGH (ref 6–23)
CO2: 31 mEq/L (ref 19–32)
CO2: 33 mEq/L — ABNORMAL HIGH (ref 19–32)
Chloride: 92 mEq/L — ABNORMAL LOW (ref 96–112)
Chloride: 94 mEq/L — ABNORMAL LOW (ref 96–112)
Creatinine, Ser: 1.1 mg/dL (ref 0.50–1.10)
Glucose, Bld: 308 mg/dL — ABNORMAL HIGH (ref 70–99)
Sodium: 138 mEq/L (ref 135–145)

## 2012-10-12 MED ORDER — METHYLPREDNISOLONE SODIUM SUCC 40 MG IJ SOLR
40.0000 mg | Freq: Three times a day (TID) | INTRAMUSCULAR | Status: DC
  Administered 2012-10-12 – 2012-10-13 (×4): 40 mg via INTRAVENOUS
  Filled 2012-10-12 (×7): qty 1

## 2012-10-12 MED ORDER — CARVEDILOL 3.125 MG PO TABS
3.1250 mg | ORAL_TABLET | Freq: Two times a day (BID) | ORAL | Status: DC
  Administered 2012-10-12 – 2012-10-17 (×10): 3.125 mg via ORAL
  Filled 2012-10-12 (×15): qty 1

## 2012-10-12 MED ORDER — POTASSIUM CHLORIDE CRYS ER 20 MEQ PO TBCR
20.0000 meq | EXTENDED_RELEASE_TABLET | Freq: Every day | ORAL | Status: DC
  Administered 2012-10-12 – 2012-10-19 (×8): 20 meq via ORAL
  Filled 2012-10-12 (×8): qty 1

## 2012-10-12 NOTE — Progress Notes (Signed)
Subjective: [name], a 79 y/o white woman with a history of severe MR and CAD, MDS with chronic anemia has been admitted for CHF by chemical criteria despite a clear x-ray. She was also found to be hypokalemic and to have EKG changes with deep ST inversion in lateral leads. Cardiology consult appreciated. For possible bacterial bronchitis she haws been given levaquin but she has no abnormaliy on CXR, no leukocytosis.   Patient is awake and states she feels a little better. Still with a cough and shortness of breath.  Objective: Lab: Lab Results  Component Value Date   WBC 8.7 10/11/2012   HGB 10.5* 10/11/2012   HCT 33.1* 10/11/2012   MCV 86.4 10/11/2012   PLT 144* 10/11/2012   BMET    Component Value Date/Time   NA 137 10/11/2012 2353   K 4.0 10/11/2012 2353   CL 94* 10/11/2012 2353   CO2 31 10/11/2012 2353   GLUCOSE 308* 10/11/2012 2353   BUN 56* 10/11/2012 2353   CREATININE 1.10 10/11/2012 2353   CALCIUM 10.0 10/11/2012 2353   GFRNONAA 43* 10/11/2012 2353   GFRAA 50* 10/11/2012 2353     Imaging: CXR 10/11/12: IMPRESSION:  No active disease. Cardiomegaly again noted. No significant  change. Mild thoracic spine osteopenia.  Scheduled Meds: . amLODipine  5 mg Oral Daily  . aspirin EC  81 mg Oral Daily  . digoxin  0.125 mg Oral Daily  . enoxaparin (LOVENOX) injection  30 mg Subcutaneous Q24H  . feeding supplement  237 mL Oral TID BM  . ferrous sulfate  325 mg Oral Q breakfast  . furosemide  60 mg Intravenous BID  . insulin aspart  0-9 Units Subcutaneous TID WC  . insulin glargine  17 Units Subcutaneous QHS  . levofloxacin  750 mg Oral Daily  . simvastatin  20 mg Oral Daily  . sodium chloride  3 mL Intravenous Q12H  . traMADol  50 mg Oral BID   Continuous Infusions:  PRN Meds:.acetaminophen, chlorpheniramine-HYDROcodone, LORazepam, temazepam   Physical Exam: Filed Vitals:   10/12/12 0548  BP: 156/54  Pulse: 62  Temp: 97.2  Resp: 14   Gen'l- Elderly white woman with cough, appears weak  but has on her makeup and jewelry. HEENT- C&S clear Neck- supple Cor - 2+ radial pulse, rate controlled, IRIR, II/VI murmur bst at apex Pulm - mild nonproductive cough, prolonged expiratory phase, end-expiratory wheezing throughout, no use of accessory muscles Abd- BS+  Ext- w/o deformity Neuro - subdued, awake, recognizes examiner.     Assessment/Plan: 1. Cardiac - mild CHF. Telemetry reviewed - stable rate controlled a. Fib. I/O -1.1 liters wt at admission 117.  Plan Continue diuresis  F/u 12 lead EKG  No change in medications  2. Pulm - viral asthmatic bronchitis without radiographic changes, with wheezing. WBC normal  Plan D/c/ levaquin  Solumedrol 40 mg q 8  con't oxygen at 2 L  3. F/e/renal -  K 4.0 after correction in ED. Cr 1.1 but BUN remains elevated at 56.   Plan  F/u Bmet at 1600 hrs  4. DM -  CBG (last 3)   Recent Labs  10/11/12 1724 10/11/12 2102 10/12/12 0642  GLUCAP 324* 299* 160*    Patient on reduced dose of Lantus and sliding scale.  Plan - during acute illness will not push for tight control.  5. MDS/chronic anemia - at admission HGb was high for her.  PLan F/u CBC  Summary - elderly woman with multiple medical  problems with a guarded prognosis. By her wish she is currently a full code.    Illene Regulus [facility] [phone]) [phone]; (c) [phone] Call-grp - [name]  Tele: [phone]  10/12/2012, 7:32 AM

## 2012-10-12 NOTE — Progress Notes (Signed)
Patient ID: [name], female   DOB: Mar 12, 1920, 79 y.o.   MRN: [id number]    Subjective:  Cough and URI   Objective:  Filed Vitals:   10/12/12 0527 10/12/12 0548 10/12/12 0900 10/12/12 0931  BP: 169/63 156/54  124/38  Pulse: 95   85  Temp: 97.2 F (36.2 C)  98 F (36.7 C) 98 F (36.7 C)  TempSrc: Oral  Oral Oral  Resp: 18   18  Height:      Weight:      SpO2:    96%    Intake/Output from previous day:  Intake/Output Summary (Last 24 hours) at 10/12/12 1254 Last data filed at 10/12/12 0537  Gross per 24 hour  Intake    826 ml  Output   1450 ml  Net   -624 ml    Physical Exam: Affect appropriate Frail elderly female HEENT: normal Neck supple with no adenopathy JVP normal no bruits no thyromegaly Lungs Mild expitory wheezing and good diaphragmatic motion Heart:  S1/S2 systolic  murmur, no rub, gallop or click PMI normal Abdomen: benighn, BS positve, no tenderness, no AAA no bruit.  No HSM or HJR Distal pulses intact with no bruits No edema Neuro non-focal Skin warm and dry No muscular weakness   Lab Results: Basic Metabolic Panel:  Recent Labs  56/21/30 1538 10/11/12 2353  NA 133* 137  K 2.7* 4.0  CL 89* 94*  CO2 28 31  GLUCOSE 339* 308*  BUN 50* 56*  CREATININE 0.93 1.10  CALCIUM 10.1 10.0   Liver Function Tests: No results found for this basename: AST, ALT, ALKPHOS, BILITOT, PROT, ALBUMIN,  in the last 72 hours No results found for this basename: LIPASE, AMYLASE,  in the last 72 hours CBC:  Recent Labs  10/11/12 1140  WBC 8.7  HGB 10.5*  HCT 33.1*  MCV 86.4  PLT 144*   Cardiac Enzymes:  Recent Labs  10/11/12 1538 10/11/12 2056 10/12/12 0253  TROPONINI <0.30 <0.30 <0.30    Imaging: Dg Chest 2 View  10/11/2012  *RADIOLOGY REPORT*  Clinical Data: Cough, tachycardia  CHEST - 2 VIEW  Comparison: 08/15/2012  Findings: Cardiomegaly again noted.  There is soft tissue prominence in the upper mediastinum stable from prior exam.  Retrosternal goiter cannot be excluded.  Clinical correlation is necessary.  No acute infiltrate or pleural effusion.  No pulmonary edema. Mild thoracic spine osteopenia.  IMPRESSION: No active disease.  Cardiomegaly again noted.  No significant change.  Mild thoracic spine osteopenia.   Original Report Authenticated By: [name], M.D.     Cardiac Studies:  ECG:  afib rate 107 diffuse ST depression and digoxen effect    Telemetry: afib rates ok   Echo:   Medications:   . amLODipine  5 mg Oral Daily  . aspirin EC  81 mg Oral Daily  . digoxin  0.125 mg Oral Daily  . enoxaparin (LOVENOX) injection  30 mg Subcutaneous Q24H  . feeding supplement  237 mL Oral TID BM  . ferrous sulfate  325 mg Oral Q breakfast  . furosemide  60 mg Intravenous BID  . insulin aspart  0-9 Units Subcutaneous TID WC  . insulin glargine  17 Units Subcutaneous QHS  . methylPREDNISolone (SOLU-MEDROL) injection  40 mg Intravenous Q8H  . potassium chloride  20 mEq Oral Daily  . simvastatin  20 mg Oral Daily  . sodium chloride  3 mL Intravenous Q12H  . traMADol  50 mg Oral BID  Assessment/Plan:  CAD: known disease not a candidate for intervention and not a CABG candidate. Atypical pain R/O Add low dose beta blocker ECG changes combination of digoxen effect, low K and possibly ischemia Afib:  Add low dose beta blocker Digoxen level ok.  No coumadin due to comorbidities URI:  Plan per Dr [name] on steroids.   Chol Continue statin. Hypokalemia:  replete  [name] 10/12/2012, 12:54 PM

## 2012-10-12 NOTE — Progress Notes (Addendum)
INITIAL NUTRITION ASSESSMENT  DOCUMENTATION CODES Per approved criteria  -Not Applicable   INTERVENTION: 1. Agree with Glucerna Shake po TID, each supplement provides 220 kcal and 10 grams of protein.   2. RD will continue to follow    NUTRITION DIAGNOSIS: Inadequate oral intake related to increased needs with illness as evidenced by weight loss.   Goal: PO intake to meet >/=90% estimated nutrition needs  Monitor:  PO intake, weight trends, labs, I/O's   Reason for Assessment: Malnutrition Screening Tool   79 y.o. female  Admitting Dx: CHF, bronchitis   ASSESSMENT: Pt admitted with weakness, CHF exacerbation, URI. Planned diuresis.   Pt reports weight loss "just since being in the hospital" and appetite is good. Weight hx shows weight loss of 12 lbs (9% body weight) in the past two months. Possible some weight loss related to fluids, but currently less then one liter net negative. Pt reports good appetite.   Height: Ht Readings from Last 1 Encounters:  10/11/12 5\' 1"  (1.549 m)    Weight: Wt Readings from Last 1 Encounters:  10/11/12 117 lb 15.1 oz (53.5 kg)    Ideal Body Weight: 105 lbs   % Ideal Body Weight: 111%  Wt Readings from Last 10 Encounters:  10/11/12 117 lb 15.1 oz (53.5 kg)  08/15/12 129 lb (58.514 kg)  07/25/12 129 lb 4.8 oz (58.65 kg)  05/30/12 124 lb 9.6 oz (56.518 kg)  04/21/12 124 lb (56.246 kg)  03/31/12 120 lb 5.9 oz (54.6 kg)  03/28/12 125 lb 3.4 oz (56.795 kg)  03/23/12 124 lb (56.246 kg)  01/20/12 130 lb (58.968 kg)  12/08/11 117 lb 11.6 oz (53.4 kg)    Usual Body Weight: 125 lbs per pt report   % Usual Body Weight: 94%  BMI:  Body mass index is 22.3 kg/(m^2). WNL   Estimated Nutritional Needs: Kcal: 1300-1500 Protein: 55-65 gm  Fluid: 1.2 L/day  Skin: intact   Diet Order: Cardiac  EDUCATION NEEDS: -No education needs identified at this time   Intake/Output Summary (Last 24 hours) at 10/12/12 1012 Last data filed at  10/12/12 0537  Gross per 24 hour  Intake    826 ml  Output   1450 ml  Net   -624 ml    Last BM: 3/4    Labs:   Recent Labs Lab 10/11/12 1140 10/11/12 1538 10/11/12 2353  NA 140 133* 137  K 2.6* 2.7* 4.0  CL 94* 89* 94*  CO2 31 28 31   BUN 51* 50* 56*  CREATININE 0.97 0.93 1.10  CALCIUM 10.1 10.1 10.0  GLUCOSE 260* 339* 308*    CBG (last 3)   Recent Labs  10/11/12 1724 10/11/12 2102 10/12/12 0642  GLUCAP 324* 299* 160*    Scheduled Meds: . amLODipine  5 mg Oral Daily  . aspirin EC  81 mg Oral Daily  . digoxin  0.125 mg Oral Daily  . enoxaparin (LOVENOX) injection  30 mg Subcutaneous Q24H  . feeding supplement  237 mL Oral TID BM  . ferrous sulfate  325 mg Oral Q breakfast  . furosemide  60 mg Intravenous BID  . insulin aspart  0-9 Units Subcutaneous TID WC  . insulin glargine  17 Units Subcutaneous QHS  . methylPREDNISolone (SOLU-MEDROL) injection  40 mg Intravenous Q8H  . potassium chloride  20 mEq Oral Daily  . simvastatin  20 mg Oral Daily  . sodium chloride  3 mL Intravenous Q12H  . traMADol  50 mg Oral  BID    Continuous Infusions:   Past Medical History  Diagnosis Date  . Inguinal hernia   . Severe mitral regurgitation     a. 12/2011 Echo: Sev MR with flail leaflet.  . Other and unspecified hyperlipidemia   . Pneumonia   . Osteoarthrosis, unspecified whether generalized or localized, unspecified site   . Unspecified essential hypertension   . Type II or unspecified type diabetes mellitus without mention of complication, not stated as uncontrolled   . Coronary atherosclerosis of unspecified type of vessel, native or graft     a. 11/2007 Cath: 3vd, sev MR ->felt to be poor surgical candidate->Med Rx  . Chronic diastolic CHF (congestive heart failure)     a. 12/2011 Echo: EF 60-65%, No rwma, Mild AI, Sev MR w/ flail segment of ant mv leflet, massive bi-atrial enlargement, Mod TR, PASP 86.  . Atrial fibrillation     a. chronic - no longer on  anticoagulation.  Marland Kitchen Unspecified asthma   . Anemia, unspecified   . Allergic rhinitis, cause unspecified   . Colon polyps 10/2009  . GIB (gastrointestinal bleeding)     2009  . Nephrolithiasis     Past Surgical History  Procedure Laterality Date  . Breast cyst aspiration      x50  . Abdominal hysterectomy  1990  . Lumbar fusion  2007    [facility]  . Total hip arthroplasty  01/2007    Right - Dr Valentina Gu  . Inguinal hernia repair  [address]    Clarene Duke RD, [place] Pager [phone] After Hours pager [phone]

## 2012-10-12 NOTE — Progress Notes (Signed)
  Echocardiogram 2D Echocardiogram has been performed.  BROWN, CALEBA 10/12/2012, 1:11 PM

## 2012-10-13 DIAGNOSIS — R413 Other amnesia: Secondary | ICD-10-CM

## 2012-10-13 DIAGNOSIS — E876 Hypokalemia: Secondary | ICD-10-CM

## 2012-10-13 DIAGNOSIS — I080 Rheumatic disorders of both mitral and aortic valves: Secondary | ICD-10-CM

## 2012-10-13 DIAGNOSIS — I10 Essential (primary) hypertension: Secondary | ICD-10-CM

## 2012-10-13 LAB — BASIC METABOLIC PANEL
BUN: 67 mg/dL — ABNORMAL HIGH (ref 6–23)
BUN: 72 mg/dL — ABNORMAL HIGH (ref 6–23)
Chloride: 94 mEq/L — ABNORMAL LOW (ref 96–112)
Creatinine, Ser: 1.01 mg/dL (ref 0.50–1.10)
Creatinine, Ser: 1.11 mg/dL — ABNORMAL HIGH (ref 0.50–1.10)
GFR calc Af Amer: 55 mL/min — ABNORMAL LOW (ref >90)
GFR calc Af Amer: 49 mL/min — ABNORMAL LOW (ref >90)
GFR calc non Af Amer: 48 mL/min — ABNORMAL LOW (ref >90)
GFR calc non Af Amer: 42 mL/min — ABNORMAL LOW (ref >90)

## 2012-10-13 LAB — GLUCOSE, CAPILLARY: Glucose-Capillary: 252 mg/dL — ABNORMAL HIGH (ref 70–99)

## 2012-10-13 MED ORDER — PREDNISONE 20 MG PO TABS
40.0000 mg | ORAL_TABLET | Freq: Every day | ORAL | Status: DC
  Administered 2012-10-14 – 2012-10-16 (×3): 40 mg via ORAL
  Filled 2012-10-13 (×4): qty 2

## 2012-10-13 MED ORDER — DIGOXIN 125 MCG PO TABS: 0.0625 mg | ORAL_TABLET | Freq: Every day | ORAL | Status: DC

## 2012-10-13 MED ORDER — PREDNISONE 10 MG PO TABS: 10.0000 mg | ORAL_TABLET | Freq: Every day | ORAL | Status: DC

## 2012-10-13 NOTE — Evaluation (Signed)
Physical Therapy Evaluation Patient Details Name: [name] MRN: [id number] DOB: 24-Apr-1920 Today's Date: 10/13/2012 Time: 1914-7829 PT Time Calculation (min): 14 min  PT Assessment / Plan / Recommendation Clinical Impression  patient admitted with SOB, with h/o CHF and CAD.  Patient required min assist for mobility which sounds slightly more than baseline.  patient also with antalgic gait on left and patient with h/o THR on left.  If pain persists, would consider ortho consult.  Feel patient safe to return to ALF if they are able to provide min assist.  Would recommend F/U PT to assist patient in return to baseline functioning.    PT Assessment  Patient needs continued PT services    Follow Up Recommendations  Home health PT (at ALF)    Does the patient have the potential to tolerate intense rehabilitation      Barriers to Discharge        Equipment Recommendations  None recommended by PT    Recommendations for Other Services     Frequency Min 2X/week    Precautions / Restrictions Precautions Precautions: Fall   Pertinent Vitals/Pain Patient reported slight discomfort in left hip/groin area      Mobility  Bed Mobility Bed Mobility: Supine to Sit;Sit to Supine Supine to Sit: 4: Min assist Sit to Supine: 5: Supervision Transfers Transfers: Sit to Stand;Stand to Sit Sit to Stand: 4: Min assist Stand to Sit: 4: Min assist Details for Transfer Assistance: min assist for physical assistance  Ambulation/Gait Ambulation/Gait Assistance: 4: Min guard Ambulation Distance (Feet): 40 Feet Assistive device: Rolling walker Ambulation/Gait Assistance Details: patient with antalgic gain on left Gait Pattern: Antalgic    Exercises     PT Diagnosis: Generalized weakness  PT Problem List: Decreased activity tolerance;Decreased mobility PT Treatment Interventions: Gait training;Functional mobility training;Therapeutic activities;Therapeutic exercise   PT Goals Acute  Rehab PT Goals PT Goal Formulation: With patient Time For Goal Achievement: 10/20/12 Potential to Achieve Goals: Good Pt will go Sit to Stand: with modified independence PT Goal: Sit to Stand - Progress: Goal set today Pt will go Stand to Sit: with modified independence PT Goal: Stand to Sit - Progress: Goal set today Pt will Ambulate: 16 - 50 feet;with modified independence;with least restrictive assistive device PT Goal: Ambulate - Progress: Goal set today  Visit Information  Last PT Received On: 10/13/12 Assistance Needed: +1    Subjective Data  Subjective: I have a nasty cough Patient Stated Goal: "I am OK"   Prior Functioning  Home Living Type of Home: Assisted living Prior Function Level of Independence: Needs assistance Needs Assistance: Gait;Transfers Gait Assistance: unable to ambulate to dining hall without help per patient Communication Communication: No difficulties    Cognition  Cognition Overall Cognitive Status: No family/caregiver present to determine baseline cognitive functioning Arousal/Alertness: Awake/alert Orientation Level: Disoriented to;Place;Situation;Time Behavior During Session: [facility] for tasks performed    Extremity/Trunk Assessment Right Upper Extremity Assessment RUE ROM/Strength/Tone: [facility] for tasks assessed Left Upper Extremity Assessment LUE ROM/Strength/Tone: [facility] for tasks assessed Right Lower Extremity Assessment RLE ROM/Strength/Tone: [facility] for tasks assessed Left Lower Extremity Assessment LLE ROM/Strength/Tone: [facility] for tasks assessed Trunk Assessment Trunk Assessment: Normal   Balance Balance Balance Assessed: No  End of Session PT - End of Session Activity Tolerance: Patient tolerated treatment well Patient left: in bed;with call bell/phone within reach  GP     Olivia Canter, PT 10/13/2012, 3:24 PM

## 2012-10-13 NOTE — Discharge Summary (Signed)
[name], [name]              ACCOUNT NO.:  [id number]  MEDICAL RECORD NO.:  [id number]  LOCATION:  4737                         FACILITY:  [facility]  PHYSICIAN:  Rosalyn Gess. Norins, MD  DATE OF BIRTH:  [birth year]  DATE OF ADMISSION:  10/11/2012 DATE OF DISCHARGE:  10/13/2012                              DISCHARGE SUMMARY   ADMITTING DIAGNOSES: 1. Hypokalemia. 2. Coronary artery disease. 3. Shortness of breath with cough. 4. Chronic anemia. 5. Chronic systolic congestive heart failure. 6. Diabetes.  DISCHARGE DIAGNOSES: 1. Hypokalemia resolved. 2. Coronary artery disease stable. 3. Mild bronchitis/viral. 4. Chronic anemia, stable. 5. Systolic congestive heart failure, stable. 6. Diabetes, stable.  CONSULTANTS:  Dr. Marca Ancona for Cardiology.  PROCEDURES:  Chest x-ray on the day of admission, which showed no active disease.  Cardiomegaly was noted.  Mild thoracic spine osteopenia was noted.  HISTORY OF PRESENT ILLNESS:  [name] is a 79 year old woman with a significant past medical history, including severe 3-vessel coronary artery disease, it is nonoperative, history of severe mitral regurgitation, and known systolic congestive heart failure and atrial fibrillation.  The patient is not anticoagulated due to multiple episodes of GI bleeding due to myelodysplastic syndrome.  The patient resides at [facility]  On Friday, October 07, 2012, [place] placed a call concerning the patient's respiratory status requesting a chest x-ray, although they said she had no shortness of breath, or cough, or wheezing.  They were advised that the patient  should be evaluated and offered a Saturday Clinic appointment.  Next call came from [facility] on Monday, October 11, 2012, when they requested again a chest x-ray.  At that point, they were instructed to bring the patient to the office for chest x-ray and evaluation, however, they opted to have her go to the emergency  department.  In the emergency department, the patient was fatigued.  She had mildly increased shortness of breath.  Chest x-ray was performed and was unremarkable. The patient's laboratories did reveal her to be hypokalemic with a potassium of 2.6.  EKG also was done and revealed the patient to have a change with deep T-waves in the lateral leads.  Cardiac enzymes were checked in the emergency department with a normal troponin.  The BNP was mildly elevated at 1100.  The patient was subsequently admitted for correction of her hypokalemia and management of her very minimal fluid overload.  Please see the H and P for past medical history, family history, and social history.  HOSPITAL COURSE: 1. Cardiac.  The patient with very minimal CHF with a mildly elevated     BNP.  Telemetry did reveal stable rate controlled atrial     fibrillation.  Cardiology saw the patient in the emergency     department in consultation and Dr. Eden Emms saw her in followup and     felt at this point she was stable from a cardiac perspective and     did not require any additional evaluation or workup or any change     in her medical regimen.  Followup EKG was ordered on the morning of     October 12, 2012, but there was no record of any EKG  in the chart for     followup.  However, her telemetry strips appeared to be normal.     The patient had no respiratory distress.  She did successfully     diurese approximately 1.7 L of fluid.  At the time of this     dictation, the patient's lungs were clear with no rales, wheezes,     no increased work of breathing, and she was thought to be stable in     regards to her heart failure. 2. Pulmonary.  The patient with some viral asthmatic bronchitis     without radiographic changes with mild wheezing.  Levaquin was     discontinued with no indication of a bacterial infection.  The     patient was started on Solu-Medrol 40 mg q.8.  At this point, she     is stable and doing well.   Would recommend discharging home on a     prednisone burst and taper at 40 mg of prednisone daily x3, 20 mg     of prednisone daily x3, 10 mg of prednisone daily x6 and then off.     She is satting well and does not need to have continued oxygen     therapy. 3. Fluids, electrolytes, and renal function.  The patient's initial     potassium was 2.7.  She was given oral potassium replacement.     Followup potassium was corrected with the most recent labs from the     day of discharge showing a potassium of 4.0.  Transferred to be     discharged home.  She will continue on oral potassium as prior to     admission. 4. Diabetes.  The patient's hemoglobin A1c was 7.2%.  She has been     taking basal insulin at her residence.  The plan is to not have     overly tight control given her advanced age and risk of     hypoglycemia.  She will resume her home regimen. 5. Myelodysplastic syndrome.  The patient has a chronic anemia.  This     admission her hemoglobin was actually robust.  Repeat H and H was     not ordered and will be checked as an outpatient.  With the patient being stable from a respiratory perspective but being stable from a cardiac perspective.  At this time, she is cleared and ready for discharge back to her extended care facility.  DISCHARGE EXAMINATION:  VITAL SIGNS:  Temperature was 97.2, blood pressure 148/49, pulse was 68, respirations 18, oxygen saturations 93%. GENERAL APPEARANCE:  This is an elderly woman sitting on the side of the bed, who is in no distress. HEENT:  Mild temporal wasting is noted.  Conjunctiva and sclerae were clear.  Pupils equal, round, and reactive. NECK:  Supple. CHEST:  The patient has no deformities. LUNGS:  The patient is moving air well.  I did not appreciate any rales, wheezes, or rhonchi.  There is no increased work of breathing. CARDIOVASCULAR :  2+ radial pulse.  Her precordium is quiet.  She has an irregularly irregular rate controlled  rhythm. ABDOMEN:  Soft. NEURO:  The patient is awake, she is alert, she recognizes the examiner. Her speech is clear and she seems oriented.  FINAL LABORATORY:  CBC from October 11, 2012, with a hemoglobin of 10.5 g, white count was 8700, platelet count 144,000.  Final chemistry from October 13, 2012, sodium 139, potassium 4.0, chloride was 95, CO2 was  31, BUN is 67, creatinine is 1.01, glucose was 249.  Cardiac enzymes with troponins negative x3.  A BNP on the day of admission was 1101.  DISCHARGE MEDICATIONS: 1. Tylenol 1000 mg b.i.d. for hip pain. 2. Vitamin D3 1000 international units daily. 3. Klonopin 0.5 mg b.i.d. as needed for anxiety. 4. Digoxin 0.125 mg  change to 0.625 (1/2 tablet) daily. 5. Colace 100 mg daily. 6. Cardura 4 mg p.o. at bedtime. 7. Iron containing vitamin supplement daily. 8. Ferrous sulfate 325 mg daily with breakfast. 9. Allegra 180 mg daily. 10.Lasix 60 mg (1-1/2 40 mg tablets) b.i.d. 11.Hydrocodone/acetaminophen 5/325 every 6 hours as needed for pain. 12.Lantus 34 units at bedtime. 13.Avapro 150 mg daily. 14.Metformin 500 mg b.i.d. 15.Reglan 5 mg b.i.d. 16.Metolazone 2.5 mg on Monday, Wednesday, and Friday 30 minutes prior     to being given her morning Lasix. 17.Potassium 20 mEq t.i.d. 18.Phenergan 12.5 mg q.6 p.r.n. 19.Zocor 20 mg daily. 20.Restoril 15 mg at bedtime. 21.Tramadol 50 mg b.i.d. as needed for pain. 22.Glucerna supplement 237 mL 3 times a day. 23.CBG 2 times daily.  DISPOSITION:  The patient is to return to assisted living/skilled care. The patient is to be seen in the office for followup in 5-7 days.  May need to add additional medication which will be prednisone 10 mg tablets 4 tabs daily x3, 2 tabs daily x3, 1 tab daily x6.  The patient's condition at the time of discharge dictation is medically stable but guarded given her advanced age and severe comorbidities.  CODE STATUS:  Her wishes are full code.     Rosalyn Gess Norins,  MD     MEN/MEDQ  D:  10/13/2012  T:  10/13/2012  Job:  [id number]

## 2012-10-13 NOTE — Progress Notes (Signed)
UR Chart Review Completed  

## 2012-10-13 NOTE — Progress Notes (Signed)
Subjective: Sitting on side of bed in no distress  Objective: Lab: Lab Results  Component Value Date   WBC 8.7 10/11/2012   HGB 10.5* 10/11/2012   HCT 33.1* 10/11/2012   MCV 86.4 10/11/2012   PLT 144* 10/11/2012   BMET    Component Value Date/Time   NA 139 10/13/2012 0540   K 4.0 10/13/2012 0540   CL 95* 10/13/2012 0540   CO2 31 10/13/2012 0540   GLUCOSE 249* 10/13/2012 0540   BUN 67* 10/13/2012 0540   CREATININE 1.01 10/13/2012 0540   CALCIUM 10.3 10/13/2012 0540   GFRNONAA 48* 10/13/2012 0540   GFRAA 55* 10/13/2012 0540     Imaging:  Scheduled Meds: . amLODipine  5 mg Oral Daily  . aspirin EC  81 mg Oral Daily  . carvedilol  3.125 mg Oral BID WC  . digoxin  0.125 mg Oral Daily  . enoxaparin (LOVENOX) injection  30 mg Subcutaneous Q24H  . feeding supplement  237 mL Oral TID BM  . ferrous sulfate  325 mg Oral Q breakfast  . furosemide  60 mg Intravenous BID  . insulin aspart  0-9 Units Subcutaneous TID WC  . insulin glargine  17 Units Subcutaneous QHS  . methylPREDNISolone (SOLU-MEDROL) injection  40 mg Intravenous Q8H  . potassium chloride  20 mEq Oral Daily  . simvastatin  20 mg Oral Daily  . sodium chloride  3 mL Intravenous Q12H  . traMADol  50 mg Oral BID   Continuous Infusions:  PRN Meds:.acetaminophen, chlorpheniramine-HYDROcodone, LORazepam, temazepam   Physical Exam: Filed Vitals:   10/13/12 0600  BP: 148/49  Pulse: 68  Temp: 97.2 F (36.2 C)  Resp: 18    See d/c dictation    Assessment/Plan:  stable for d/c to prior living facility. Dictated # [id number]   Illene Regulus [facility] (o) [phone]; (c) [phone] Call-grp - [name]  Tele: [phone]  10/13/2012, 8:21 AM

## 2012-10-13 NOTE — Progress Notes (Signed)
Inpatient Diabetes Program Recommendations  AACE/ADA: New Consensus Statement on Inpatient Glycemic Control (2013)  Target Ranges:  Prepandial:   less than 140 mg/dL      Peak postprandial:   less than 180 mg/dL (1-2 hours)      Critically ill patients:  140 - 180 mg/dL   Reason for Visit: Hyperglycemia  Results for [name], [name] (MRN [id number]) as of 10/13/2012 09:54  Ref. Range 10/12/2012 06:42 10/12/2012 11:19 10/12/2012 16:25 10/12/2012 21:13 10/13/2012 06:00  Glucose-Capillary Latest Range: 70-99 mg/dL 782 (H) 956 (H) 213 (H) 286 (H) 235 (H)  Results for [name], [name] (MRN [id number]) as of 10/13/2012 09:54  Ref. Range 10/13/2012 05:40  Sodium Latest Range: 135-145 mEq/L 139  Potassium Latest Range: 3.5-5.1 mEq/L 4.0  Chloride Latest Range: 96-112 mEq/L 95 (L)  CO2 Latest Range: 19-32 mEq/L 31  BUN Latest Range: 6-23 mg/dL 67 (H)  Creatinine Latest Range: 0.50-1.10 mg/dL 6.29  Calcium Latest Range: 8.4-10.5 mg/dL 52.8  GFR calc non Af Amer Latest Range: >90 mL/min 48 (L)  GFR calc Af Amer Latest Range: >90 mL/min 55 (L)  Glucose Latest Range: 70-99 mg/dL 413 (H)    Inpatient Diabetes Program Recommendations Insulin - Basal: Increase Lantus to 25 units QHS (home dose is 34 units QHS) Insulin - Meal Coverage: Consider small amount of meal coverage insulin - Novolog 2 units tidwc if pt eats >50% meal HgbA1C: Need updated HgbA1C to assess glycemic control prior to hospitalization ( last one 7.0% on 06/24/12)  Will continue to follow.  Thank you. Ailene Ards, RD, LDN, CDE Inpatient Diabetes Coordinator [phone]

## 2012-10-13 NOTE — Progress Notes (Signed)
Resident of [employer].  Patient was scheduled for d/c back to facility today per MD but CSW received call from patient's son requesting guidance/recommendations regarding return to ALF, SNF or return home with home health and private duty. Patient is apparently wanting to go home and family hopes to make this happen.  Patient's son decided to allow return to ALF for a short period of time and then arrange for home care.  Later in the day- patient's daughter verbalized concerns about patient sounding very congested, having a strong cough and questioned her ability to return to ALF.  CSW spoke with Dr. Darcus Austin who acknowledged above and requested a PT consult to insure that patient could safely return to ALF.  PT saw patient and confirmed patient will be safe for return to ALF with Home Health PT.  Thus, will discuss with patient's son in the a.m and complete d/c plan for return to facility.  CSW will also consult admissions at the ALF to finalize arrangements. They will accept patient back.  Lorri Frederick. [place]  [phone]

## 2012-10-14 ENCOUNTER — Inpatient Hospital Stay (HOSPITAL_COMMUNITY): Payer: Medicare Other

## 2012-10-14 ENCOUNTER — Encounter (HOSPITAL_COMMUNITY): Payer: Self-pay | Admitting: Radiology

## 2012-10-14 LAB — BASIC METABOLIC PANEL
BUN: 70 mg/dL — ABNORMAL HIGH (ref 6–23)
CO2: 37 mEq/L — ABNORMAL HIGH (ref 19–32)
Chloride: 95 mEq/L — ABNORMAL LOW (ref 96–112)
GFR calc Af Amer: 62 mL/min — ABNORMAL LOW (ref >90)
GFR calc non Af Amer: 54 mL/min — ABNORMAL LOW (ref >90)
Glucose, Bld: 90 mg/dL (ref 70–99)
Potassium: 3.4 mEq/L — ABNORMAL LOW (ref 3.5–5.1)
Potassium: 3.8 mEq/L (ref 3.5–5.1)
Sodium: 142 mEq/L (ref 135–145)
Sodium: 140 mEq/L (ref 135–145)

## 2012-10-14 LAB — GLUCOSE, CAPILLARY: Glucose-Capillary: 93 mg/dL (ref 70–99)

## 2012-10-14 MED ORDER — FUROSEMIDE 40 MG PO TABS
60.0000 mg | ORAL_TABLET | Freq: Two times a day (BID) | ORAL | Status: DC
  Administered 2012-10-15 – 2012-10-19 (×9): 60 mg via ORAL
  Filled 2012-10-14 (×13): qty 1

## 2012-10-14 MED ORDER — IOHEXOL 350 MG/ML SOLN
60.0000 mL | Freq: Once | INTRAVENOUS | Status: AC | PRN
  Administered 2012-10-14: 60 mL via INTRAVENOUS

## 2012-10-14 MED ORDER — PREDNISONE 10 MG PO TABS: ORAL_TABLET | ORAL | Status: DC

## 2012-10-14 NOTE — Progress Notes (Signed)
TELEMETRY: Reviewed telemetry pt in atrial fibrillation with controlled response.: Filed Vitals:   10/14/12 0546 10/14/12 0900 10/14/12 1116 10/14/12 1118  BP: 172/61   143/43  Pulse: 77  64   Temp: 97.7 F (36.5 C)     TempSrc: Oral     Resp: 18     Height:      Weight: 118 lb 3.2 oz (53.615 kg)     SpO2: 92% 96%      Intake/Output Summary (Last 24 hours) at 10/14/12 1334 Last data filed at 10/14/12 1140  Gross per 24 hour  Intake    823 ml  Output   1450 ml  Net   -627 ml    SUBJECTIVE Feels OK. Still has cough. No chest pain. Breathing is better.  LABS: Basic Metabolic Panel:  Recent Labs  16/10/96 1615 10/14/12 0618  NA 138 142  K 4.2 3.4*  CL 94* 95*  CO2 33* 37*  GLUCOSE 297* 90  BUN 72* 76*  CREATININE 1.11* 1.07  CALCIUM 10.1 10.1   CBC: No results found for this basename: WBC, NEUTROABS, HGB, HCT, MCV, PLT,  in the last 72 hours Cardiac Enzymes:  Recent Labs  10/11/12 1538 10/11/12 2056 10/12/12 0253  TROPONINI <0.30 <0.30 <0.30   BNP: 1101 Dig level 0.6  Radiology/Studies:  Dg Chest 2 View  10/11/2012  *RADIOLOGY REPORT*  Clinical Data: Cough, tachycardia  CHEST - 2 VIEW  Comparison: 08/15/2012  Findings: Cardiomegaly again noted.  There is soft tissue prominence in the upper mediastinum stable from prior exam. Retrosternal goiter cannot be excluded.  Clinical correlation is necessary.  No acute infiltrate or pleural effusion.  No pulmonary edema. Mild thoracic spine osteopenia.  IMPRESSION: No active disease.  Cardiomegaly again noted.  No significant change.  Mild thoracic spine osteopenia.   Original Report Authenticated By: Natasha Mead, M.D.     Transthoracic Echocardiography  Patient: Taiesha, Bovard MR #: [id number] Study Date: 10/12/2012 Gender: F Age: 79 Height: Weight: BSA: Pt. Status: Room: CD07C  PERFORMING [place], [facility] ATTENDING Norins, Carney Harder SONOGRAPHER Nolon Rod, RDCS ADMITTING Elvera Lennox,  Costin ORDERING Gherghe, Costin REFERRING Gherghe, Costin cc:  ------------------------------------------------------------ LV EF: 55% - 60%  ------------------------------------------------------------ Study Conclusions  - Left ventricle: The cavity size was normal. Wall thickness was normal. Systolic function was normal. The estimated ejection fraction was in the range of 55% to 60%. - Aortic valve: There was mild stenosis. Valve area: 1.52cm^2(VTI). Valve area: 1.66cm^2 (Vmax). - Mitral valve: Probable flail segment to anterior leaflet with severe eccentric MR Valve area by continuity equation (using LVOT flow): 1.47cm^2. - Left atrium: The atrium was severely dilated. - Right atrium: The atrium was severely dilated. - Atrial septum: No defect or patent foramen ovale was identified. Transthoracic echocardiography. M-mode, complete 2D, spectral Doppler, and color Doppler. Patient status: Inpatient.  ------------------------------------------------------------  ------------------------------------------------------------ Left ventricle: The cavity size was normal. Wall thickness was normal. Systolic function was normal. The estimated ejection fraction was in the range of 55% to 60%.  ------------------------------------------------------------ Aortic valve: Doppler: There was mild stenosis. VTI ratio of LVOT to aortic valve: 0.54. Valve area: 1.52cm^2(VTI). Peak velocity ratio of LVOT to aortic valve: 0.58. Valve area: 1.66cm^2 (Vmax). Mean gradient: 10mm Hg (S). Peak gradient: 17mm Hg (S).  ------------------------------------------------------------ Mitral valve: Probable flail segment to anterior leaflet with severe eccentric MR Doppler: Valve area by pressure half-time: 3.28cm^2. Valve area by continuity equation (using LVOT flow): 1.47cm^2. Mean gradient: 8mm Hg (D). Peak gradient: 23mm  Hg (D).  ------------------------------------------------------------ Left  atrium: The atrium was severely dilated.  ------------------------------------------------------------ Atrial septum: No defect or patent foramen ovale was identified.  ------------------------------------------------------------ Right ventricle: The cavity size was normal. Wall thickness was normal. Systolic function was normal.  ------------------------------------------------------------ Pulmonic valve: Doppler: Mild regurgitation.  ------------------------------------------------------------ Tricuspid valve: Doppler: Mild regurgitation.  ------------------------------------------------------------ Right atrium: The atrium was severely dilated.  ------------------------------------------------------------ Pericardium: The pericardium was normal in appearance.  ------------------------------------------------------------  2D measurements Normal Doppler measurements Normal Left ventricle LVOT LVID ED, 56.6 mm 43-52 Peak vel, S 122 cm/s ------ chord, PLAX VTI, S 24. cm ------ LVID ES, 30.4 mm 23-38 3 chord, PLAX Peak 6 mm ------ FS, chord, 46 % >29 gradient, S Hg PLAX Aortic valve LVPW, ED 10.5 mm ------ Peak vel, S 209 cm/s ------ IVS/LVPW 1.06 <1.3 Mean vel, S 149 cm/s ------ ratio, ED VTI, S 45. cm ------ Ventricular septum 4 IVS, ED 11.1 mm ------ Mean 10 mm ------ LVOT gradient, S Hg Diam, S 19 mm ------ Peak 17 mm ------ Area 2.84 cm^2 ------ gradient, S Hg Aorta VTI ratio 0.5 ------ Root diam, 34 mm ------ LVOT/AV 4 ED Area, VTI 1.5 cm^2 ------ Left atrium 2 AP dim 66 mm ------ Peak vel 0.5 ------ ratio, 8 LVOT/AV Area, Vmax 1.6 cm^2 ------ 6 Regurg PHT 353 ms ------ Mitral valve Peak E vel 189 cm/s ------ Peak A vel 59. cm/s ------ 2 Mean vel, D 125 cm/s ------ Deceleration 182 ms 150-23 time 0 Pressure 67 ms ------ half-time Mean 8 mm ------ gradient, D Hg Peak 23 mm ------ gradient, D Hg Peak E/A 3.2 ------ ratio Area (PHT) 3.2 cm^2  ------ 8 Area (LVOT) 1.4 cm^2 ------ continuity 7 Annulus VTI 46. cm ------ 9 Max regurg 560 cm/s ------ vel Regurg VTI 176 cm ------ Tricuspid valve Regurg peak 392 cm/s ------ vel Peak RV-RA 61 mm ------ gradient, S Hg Pulmonic valve Regurg vel, 204 cm/s ------ ED  ------------------------------------------------------------ Prepared and Electronically Authenticated by  [name] 2014-03-05T14:23:[id number]     PHYSICAL EXAM General: Elderly, thin, frail WF  in no acute distress. Head: Normocephalic, atraumatic, sclera non-icteric, no xanthomas, nares are without discharge. Neck: Negative for carotid bruits. JVD not elevated. Lungs: Bilateral rhonchi. Breathing is unlabored. Heart: IRRR S1 S2 with 3/6 systolic murmur at the apex. No S3 Abdomen: Soft, non-tender, non-distended with normoactive bowel sounds. No hepatomegaly. No rebound/guarding. No obvious abdominal masses. Msk:  Strength and tone appears normal for age. Extremities: No clubbing, cyanosis or edema.  Distal pedal pulses are 2+ and equal bilaterally. Neuro: Alert and oriented X 3. Moves all extremities spontaneously. Psych:  Responds to questions appropriately with a normal affect.  ASSESSMENT AND PLAN: 1. Atrial fibrillation rate controlled on metoprolol and digoxin. Poor candidate for anticoagulation. Continue ASA 81 mg daily. 2. CAD. No further chest pain. Not a candidate for intervention. Continue ASA and beta blocker. 3. Severe MR with ruptured chord vs partially flail leaflet. Not a candidate for repair. Continue diuretics. Would switch to po given increased azotemia. 4. Acute bronchitis per primary team.  Active Problems:   DIABETES MELLITUS, TYPE II   HYPERTENSION   CORONARY ARTERY DISEASE   Atrial fibrillation   CONGESTIVE HEART FAILURE   Iron deficiency anemia   EKG, abnormal    Signed, Peter [place] MD,FACC 10/14/2012 1:41 PM

## 2012-10-14 NOTE — Progress Notes (Signed)
10/14/12 1435 Noted CM referral for HH RN and HH PT.  Pt. is discharging  back to [facility] today, and the facility can provide these services. Tera Mater, RN, BSN NCM [phone]

## 2012-10-14 NOTE — Progress Notes (Signed)
Physical Therapy Treatment Patient Details Name: [name] MRN: [id number] DOB: [birth year] Today's Date: 10/14/2012 Time: 7829-5621 PT Time Calculation (min): 34 min  PT Assessment / Plan / Recommendation Comments on Treatment Session  79 y/o female adm. for sob in setting of chf exacerbation. Progressing well today but remains weak with painful hip. Spoke with daughter on the phone who confirms that ALF can provide adequate assist for her on d/c. She knows my recommendation of supervision for all OOB/mobility. Continue to recommend HHPT for f/u.     Follow Up Recommendations  Home health PT;Supervision for mobility/OOB (at ALF)     Does the patient have the potential to tolerate intense rehabilitation     Barriers to Discharge        Equipment Recommendations  None recommended by PT    Recommendations for Other Services    Frequency Min 3X/week   Plan Discharge plan remains appropriate;Frequency remains appropriate    Precautions / Restrictions Precautions Precautions: Fall Restrictions Weight Bearing Restrictions: No   Pertinent Vitals/Pain SpO2 97% on 2 liters    Mobility  Bed Mobility Bed Mobility: Supine to Sit Supine to Sit: HOB flat;6: Modified independent (Device/Increase time);With rails Details for Bed Mobility Assistance: needs extra time due to weakness and pain in hip Transfers Transfers: Sit to Stand;Stand to Sit Sit to Stand: 5: Supervision;4: Min guard;From toilet;From chair/3-in-1;From bed Stand to Sit: 5: Supervision;To chair/3-in-1;To toilet Details for Transfer Assistance: reminders for safe hand placement, did falter slightly standing from toilet needing cues to pull on more stable surface (grab bar) rather than trying to pull on toilet Ambulation/Gait Ambulation/Gait Assistance: 5: Supervision Ambulation Distance (Feet): 100 Feet Assistive device: Rolling walker Ambulation/Gait Assistance Details: cues for tall posture and improved positioning  of arms/trunk for leverage to to unweight left leg and reduce pain during gait, moves slowly because of painful hip, antalgia, narrow BOS Gait Pattern: Antalgic;Narrow base of support;Trunk flexed Gait velocity: decreased      PT Goals Acute Rehab PT Goals PT Goal: Sit to Stand - Progress: Progressing toward goal PT Goal: Stand to Sit - Progress: Progressing toward goal PT Goal: Ambulate - Progress: Progressing toward goal  Visit Information  Last PT Received On: 10/14/12 Assistance Needed: +1    Subjective Data  Subjective: I didn't cough until I started moving this morning.   Cognition  Cognition Overall Cognitive Status: Appears within functional limits for tasks assessed/performed Arousal/Alertness: Awake/alert Orientation Level: Appears intact for tasks assessed Behavior During Session: [facility] for tasks performed    Balance     End of Session PT - End of Session Equipment Utilized During Treatment: Gait belt Activity Tolerance: Patient tolerated treatment well Patient left: in chair;with call bell/phone within reach   GP     [facility] HELEN 10/14/2012, 9:41 AM

## 2012-10-14 NOTE — Progress Notes (Signed)
Patient O2 sat drop to 85% on RA, also C/O shortness of breath and cough. Dr. Debby Bud called, MD order CT angio of chest to R/O PE, and place patient back on oxygen. Will continue to monitor.

## 2012-10-14 NOTE — Progress Notes (Signed)
Subjective: [name] is sitting up in a chair in no acute distress, she does have a persistent cough.  Objective: Lab: Lab Results  Component Value Date   WBC 8.7 10/11/2012   HGB 10.5* 10/11/2012   HCT 33.1* 10/11/2012   MCV 86.4 10/11/2012   PLT 144* 10/11/2012   BMET    Component Value Date/Time   NA 142 10/14/2012 0618   K 3.4* 10/14/2012 0618   CL 95* 10/14/2012 0618   CO2 37* 10/14/2012 0618   GLUCOSE 90 10/14/2012 0618   BUN 76* 10/14/2012 0618   CREATININE 1.07 10/14/2012 0618   CALCIUM 10.1 10/14/2012 0618   GFRNONAA 44* 10/14/2012 0618   GFRAA 51* 10/14/2012 0618     Imaging:  Scheduled Meds: . amLODipine  5 mg Oral Daily  . aspirin EC  81 mg Oral Daily  . carvedilol  3.125 mg Oral BID WC  . digoxin  0.125 mg Oral Daily  . enoxaparin (LOVENOX) injection  30 mg Subcutaneous Q24H  . feeding supplement  237 mL Oral TID BM  . ferrous sulfate  325 mg Oral Q breakfast  . furosemide  60 mg Intravenous BID  . insulin aspart  0-9 Units Subcutaneous TID WC  . insulin glargine  17 Units Subcutaneous QHS  . potassium chloride  20 mEq Oral Daily  . predniSONE  40 mg Oral Q breakfast  . simvastatin  20 mg Oral Daily  . sodium chloride  3 mL Intravenous Q12H  . traMADol  50 mg Oral BID   Continuous Infusions:  PRN Meds:.acetaminophen, chlorpheniramine-HYDROcodone, LORazepam, temazepam   Physical Exam: Filed Vitals:   10/14/12 0546  BP: 172/61  Pulse: 77  Temp: 97.7 F (36.5 C)  Resp: 18  Gen'l- Very elderly and frail white woman with a cough HEENT - C&S clear Cor - heart sounds are distant Pulm - no increased WOB. Coarse rhonchi at the bases w/o wheezing Neuro - awake, recognizes examiner      Assessment/Plan: Plan is for discharge with return to AL. She will be seen in follow up in 7-10 days. [facility] PT/RN services ordered per PT recommendation. F-F form completed.    Illene Regulus Combs IM (o) [phone]; (c) [phone] Call-grp - [name] IM  Tele: [phone]  10/14/2012,  9:59 AM

## 2012-10-14 NOTE — Progress Notes (Signed)
Occupational Therapy Evaluation Patient Details Name: [name] MRN: [id number] DOB: [birth year] Today's Date: 10/14/2012 Time: 1191-4782 OT Time Calculation (min): 33 min  OT Assessment / Plan / Recommendation Clinical Impression  79 yo admitted with CHF exacerbation. PTA lived in ALF. During eval, pt desated after ADL session to 88 on RA and c/o SOB. Pt rechecked and O2 85 RA. Pt put on 2 L and O2 increased to 94. Pt requires Min A for LB ADL and will need assistance for ADL. Pt will most likely need O2 at facility and try to wean if possible. Rec HHOT     OT Assessment  All further OT needs can be met in the next venue of care    Follow Up Recommendations  Home health OT;Supervision/Assistance - 24 hour (ALF)    Barriers to Discharge      Equipment Recommendations  None recommended by OT    Recommendations for Other Services    Frequency       Precautions / Restrictions Precautions Precautions: Fall   Pertinent Vitals/Pain O2 88 RA after ADL. Rechecked after 2 min 85 RA. O2 [name] 2L  - 94. nsg notified    ADL  Grooming: Set up Where Assessed - Grooming: Supported sitting Upper Body Bathing: Set up Where Assessed - Upper Body Bathing: Supported sitting Lower Body Bathing: Minimal assistance Where Assessed - Lower Body Bathing: Supported sit to stand Upper Body Dressing: Set up Lower Body Dressing: Minimal assistance Where Assessed - Lower Body Dressing: Supported sit to Pharmacist, hospital: Supervision/safety Statistician Method: Other (comment) (ambulating) Acupuncturist: Comfort height toilet Toileting - Clothing Manipulation and Hygiene: Supervision/safety Where Assessed - Engineer, mining and Hygiene: Sit to stand from 3-in-1 or toilet Equipment Used: Gait belt Transfers/Ambulation Related to ADLs: S ADL Comments: desat with ADL to 85    OT Diagnosis: Generalized weakness  OT Problem List: Decreased activity  tolerance;Cardiopulmonary status limiting activity;Decreased knowledge of use of DME or AE OT Treatment Interventions:     OT Goals Acute Rehab OT Goals OT Goal Formulation:  (eval only)  Visit Information  Last OT Received On: 10/14/12 Assistance Needed: +1    Subjective Data      Prior Functioning     Home Living Lives With: Other (Comment) Available Help at Discharge: Available 24 hours/day Type of Home: Assisted living Prior Function Level of Independence: Needs assistance Needs Assistance: Gait;Transfers Gait Assistance: unable to ambulate to dining hall without help per patient Communication Communication: No difficulties         Vision/Perception     Cognition  Cognition Overall Cognitive Status: Appears within functional limits for tasks assessed/performed Arousal/Alertness: Awake/alert Orientation Level: Appears intact for tasks assessed Behavior During Session: [facility] for tasks performed    Extremity/Trunk Assessment Right Upper Extremity Assessment RUE ROM/Strength/Tone: [facility] for tasks assessed Left Upper Extremity Assessment LUE ROM/Strength/Tone: WFL for tasks assessed Right Lower Extremity Assessment RLE ROM/Strength/Tone: [facility] for tasks assessed Left Lower Extremity Assessment LLE ROM/Strength/Tone:  (premorbid deficits) Trunk Assessment Trunk Assessment: Normal     Mobility Bed Mobility Bed Mobility: Supine to Sit Supine to Sit: 5: Supervision Transfers Transfers: Sit to Stand;Stand to Sit Sit to Stand: 5: Supervision;From bed Stand to Sit: 5: Supervision;To chair/3-in-1 Details for Transfer Assistance: 1 LOB transferring to bed     Exercise     Balance   S   End of Session OT - End of Session Equipment Utilized During Treatment: Gait belt Activity Tolerance: Patient  limited by fatigue Patient left: in chair;with call bell/phone within reach Nurse Communication: Mobility status;Other (comment) (pt desat to 85 on RA after ADL)  GO      WARD,HILLARY 10/14/2012, 4:27 PM [facility], OTR/L  [phone] 10/14/2012

## 2012-10-15 ENCOUNTER — Encounter (HOSPITAL_COMMUNITY): Payer: Self-pay | Admitting: Emergency Medicine

## 2012-10-15 LAB — GLUCOSE, CAPILLARY
Glucose-Capillary: 290 mg/dL — ABNORMAL HIGH (ref 70–99)
Glucose-Capillary: 321 mg/dL — ABNORMAL HIGH (ref 70–99)
Glucose-Capillary: 251 mg/dL — ABNORMAL HIGH (ref 70–99)

## 2012-10-15 LAB — BASIC METABOLIC PANEL
BUN: 62 mg/dL — ABNORMAL HIGH (ref 6–23)
BUN: 65 mg/dL — ABNORMAL HIGH (ref 6–23)
CO2: 39 mEq/L — ABNORMAL HIGH (ref 19–32)
Calcium: 10.2 mg/dL (ref 8.4–10.5)
Chloride: 96 mEq/L (ref 96–112)
Creatinine, Ser: 0.8 mg/dL (ref 0.50–1.10)
GFR calc Af Amer: 73 mL/min — ABNORMAL LOW (ref >90)
GFR calc non Af Amer: 63 mL/min — ABNORMAL LOW (ref >90)
GFR calc non Af Amer: 55 mL/min — ABNORMAL LOW (ref >90)
Glucose, Bld: 144 mg/dL — ABNORMAL HIGH (ref 70–99)
Glucose, Bld: 352 mg/dL — ABNORMAL HIGH (ref 70–99)
Potassium: 3.7 mEq/L (ref 3.5–5.1)
Potassium: 4.4 mEq/L (ref 3.5–5.1)
Sodium: 143 mEq/L (ref 135–145)

## 2012-10-15 MED ORDER — ONDANSETRON HCL 4 MG/2ML IJ SOLN
4.0000 mg | Freq: Four times a day (QID) | INTRAMUSCULAR | Status: DC | PRN
  Administered 2012-10-15 – 2012-10-19 (×3): 4 mg via INTRAVENOUS
  Filled 2012-10-15 (×4): qty 2

## 2012-10-15 MED ORDER — LEVALBUTEROL HCL 0.63 MG/3ML IN NEBU
0.6300 mg | INHALATION_SOLUTION | Freq: Three times a day (TID) | RESPIRATORY_TRACT | Status: DC
  Administered 2012-10-15 – 2012-10-16 (×3): 0.63 mg via RESPIRATORY_TRACT
  Filled 2012-10-15 (×6): qty 3

## 2012-10-15 MED ORDER — GUAIFENESIN ER 600 MG PO TB12
600.0000 mg | ORAL_TABLET | Freq: Two times a day (BID) | ORAL | Status: DC
  Administered 2012-10-15 – 2012-10-19 (×9): 600 mg via ORAL
  Filled 2012-10-15 (×10): qty 1

## 2012-10-15 NOTE — Progress Notes (Signed)
Subjective: Not feeling as well today, still with congested cough, nausea.  O sat 90 on RA  Objective: Vital signs in last 24 hours: Temp:  [97.8 F (36.6 C)-98.1 F (36.7 C)] 98.1 F (36.7 C) (03/08 7829) Pulse Rate:  [64-83] 68 (03/08 0608) Resp:  [18] 18 (03/08 0608) BP: (137-160)/(43-63) 137/47 mmHg (03/08 0608) SpO2:  [97 %-98 %] 97 % (03/08 0608) Weight:  [53.57 kg (118 lb 1.6 oz)] 53.57 kg (118 lb 1.6 oz) (03/08 5621) Weight change: -0.045 kg (-1.6 oz) Last BM Date: 10/14/12  Intake/Output from previous day: 03/07 0701 - 03/08 0700 In: 1183 [P.O.:1180; I.V.:3] Out: 2075 [Urine:2075] Intake/Output this shift: Total I/O In: 360 [P.O.:360] Out: -   General appearance: alert and cooperative Resp: wheezes bilaterally Cardio: irregularly irregular rhythm Extremities: extremities normal, atraumatic, no cyanosis or edema  Lab Results: No results found for this basename: WBC, HGB, HCT, PLT,  in the last 72 hours BMET  Recent Labs  10/14/12 1544 10/15/12 0630  NA 140 143  K 3.8 3.7  CL 92* 96  CO2 38* 39*  GLUCOSE 177* 144*  BUN 70* 62*  CREATININE 0.91 0.80  CALCIUM 10.2 10.2    Studies/Results: Ct Angio Chest Pe W/cm &/or Wo Cm  10/14/2012  *RADIOLOGY REPORT*  Clinical Data: Short of breath, cough, evaluate for pulmonary embolism  CT ANGIOGRAPHY CHEST  Technique:  Multidetector CT imaging of the chest using the standard protocol during bolus administration of intravenous contrast. Multiplanar reconstructed images including MIPs were obtained and reviewed to evaluate the vascular anatomy.  Contrast: 60mL OMNIPAQUE IOHEXOL 350 MG/ML SOLN  Comparison: Chest x-ray 10/11/2012  Findings:  Mediastinum: Mildly enlarged, heterogeneous multinodular thyroid gland suggestive of goiter.  Further evaluation is limited on CT technique.  No suspicious mediastinal or hilar adenopathy.  A precarinal lymph node measures 8 mm in short axis.  Visualized thoracic esophagus is within normal  limits.  Heart/Vascular: Excellent opacification of pulmonary arteries to the subsegmental level no central filling defect to suggest acute pulmonary embolus.  The main central pulmonary arteries are enlarged.  The main pulmonary artery measures 3.7 cm in diameter. There is a bovine configuration of the aortic arch (two vessel arch with common origin of the brachiocephalic and left common carotid arteries), a normal anatomic variant.  Aneurysmal dilatation of the ascending thoracic aorta to 4.6 cm.  The transverse thoracic aorta is mildly ectatic at 3.8 cm in diameter.  The descending thoracic aorta is mildly ectatic at 3.6 cm in diameter.  Scattered atherosclerotic vascular calcifications are noted throughout. Atherosclerotic vascular calcifications also noted throughout the coronary arteries.  There is marked cardiomegaly with left greater than the right by atrial enlargement.  Contrast material refluxes into the suprahepatic IVC and hepatic veins suggesting right heart strain.  Query mild thickening of the aortic valve leaflets. Calcification is noted in the mitral valve annulus.  No pericardial effusion.  Lungs/Pleura:  The lungs are mildly hyperinflated.  There is diffuse mild bronchial wall thickening.  Respiratory motion in the bases limits evaluation for small pulmonary nodules.  There is a 4 mm nodule the periphery of the right lower lobe.  Linear atelectasis versus scarring noted inferomedially in the right upper and middle lobes as well as in the inferior lingula.  No focal consolidation.  No pleural effusion or pneumothorax.  Upper Abdomen: Low attenuation lesion with probable rim calcification in the upper pole of the right kidney measures up to 2 cm.  This is incompletely evaluated but  statistically highly likely a cyst.  Otherwise, limited imaging of the upper abdomen is unremarkable.  Bones: No acute fracture or aggressive appearing lytic or blastic osseous lesion.  IMPRESSION:  1.  Negative for  pulmonary embolism, pneumonia or other acute pulmonary process.  2.  Marked cardiomegaly with left greater than right biatrial enlargement.  Reflux of contrast material into the suprahepatic IVC and hepatic veins suggest underlying right heart strain.  3.  Aneurysmal dilatation of the ascending thoracic aorta to 4.6 cm.  4.  Suggestion of thickening of the aortic valve cusps raises the question of aortic stenosis as the underlying etiology of the ascending aortic aneurysmal dilatation.  This could be further evaluated by echocardiography if clinically warranted.  5.  Marked enlargement of the main central pulmonary arteries suggesting underlying pulmonary arterial hypertension.  6.  Atherosclerosis including coronary artery disease  7.  Pulmonary hyperinflation and mild diffuse bronchial wall thickening suggests COPD.  8.  Small 4 mm nodule in the right lower lobe. If the patient is at high risk for bronchogenic carcinoma, follow-up chest CT at 1 year is recommended.  If the patient is at low risk, no follow-up is needed.  This recommendation follows the consensus statement: Guidelines for Management of Small Pulmonary Nodules Detected on CT Scans:  A Statement from the Fleischner Society as published in Radiology 2005; 237:395-400.   Original Report Authenticated By: Malachy Moan, M.D.     Medications: I have reviewed the patient's current medications.  Assessment/Plan: Active Problems:  CHF  Still some cough, continue current medical therapy/lasix bid  Viral Bronchitis, on prednisone, will add some xopenex/mucinex for cough and wheezing  DM acceptable control  Not feeling well enough for discharge   LOS: 4 days   GRIFFIN,JOHN JOSEPH 10/15/2012, 11:13 AM

## 2012-10-15 NOTE — Discharge Summary (Signed)
[name], [name]              ACCOUNT NO.:  [id number]  MEDICAL RECORD NO.:  [id number]  LOCATION:  4737                         FACILITY:  [facility]  PHYSICIAN:  Rosalyn Gess. Norins, MD  DATE OF BIRTH:  February 14, 1920  DATE OF ADMISSION:  10/11/2012 DATE OF DISCHARGE:                              DISCHARGE SUMMARY   ADDENDUM:  Addendum from October 13, 2012 to October 13, 2012.  The patient's discharge was delayed due to the family's concern about the patient's instability.  Physical therapy saw the patient in consultation.  Recommendation was for the patient to be able to return to assisted living with home health physical therapy was recommended. The patient has otherwise remained stable with no major changes in her condition.  I spoke with the patient's son, Madysin Crisp [facility]), who is in full agreement with this plan.  DISCHARGE EXAMINATION:  VITAL SIGNS:  Temperature was 97.7, blood pressure was 176/61, heart rate 77, respirations 18, O2 sat was 96% on 2 L. GENERAL APPEARANCE:  This is an elderly woman sitting in a chair.  She is in no acute distress, but she does have a persistent cough. HEENT:  Conjunctiva sclerae were clear. CHEST:  The patient has some course rhonchi.  No wheezing is noted. There is no increased work of breathing. CARDIOVASCULAR:  Heart sounds were distant. ABDOMEN:  Soft. NEURO:  The patient is awake.  She is alert, she answers questions and follows commands.  LABORATORY DATA:  No additional laboratories since discharge dictation.  DISCHARGE MEDICATIONS:  Please see the initial discharge dictation.  We will add Robitussin DM 1 teaspoon q.4 hours while awake for cough.  Home health physical therapy and nursing is ordered for monitoring her recovery and for continued physical therapy.  Further recommendations as inpatient physical therapy assessment.  The patient does appear to be otherwise stable, although her condition is guarded given her advanced age and  multiple medical problems.  The patient will be seen for followup in 7-10 days.  Her son has decided to make a call to the office on Monday to schedule set appointment.     Rosalyn Gess Norins, MD     MEN/MEDQ  D:  10/14/2012  T:  10/15/2012  Job:  [id number]

## 2012-10-16 LAB — BASIC METABOLIC PANEL
BUN: 66 mg/dL — ABNORMAL HIGH (ref 6–23)
BUN: 66 mg/dL — ABNORMAL HIGH (ref 6–23)
CO2: 39 mEq/L — ABNORMAL HIGH (ref 19–32)
Chloride: 96 mEq/L (ref 96–112)
Chloride: 92 mEq/L — ABNORMAL LOW (ref 96–112)
GFR calc Af Amer: 55 mL/min — ABNORMAL LOW (ref >90)
GFR calc Af Amer: 51 mL/min — ABNORMAL LOW (ref >90)
Potassium: 4 mEq/L (ref 3.5–5.1)
Potassium: 4.7 mEq/L (ref 3.5–5.1)

## 2012-10-16 LAB — GLUCOSE, CAPILLARY
Glucose-Capillary: 88 mg/dL (ref 70–99)
Glucose-Capillary: 292 mg/dL — ABNORMAL HIGH (ref 70–99)
Glucose-Capillary: 347 mg/dL — ABNORMAL HIGH (ref 70–99)

## 2012-10-16 MED ORDER — PREDNISONE 50 MG PO TABS
60.0000 mg | ORAL_TABLET | Freq: Every day | ORAL | Status: DC
  Administered 2012-10-17 – 2012-10-19 (×3): 60 mg via ORAL
  Filled 2012-10-16 (×5): qty 1

## 2012-10-16 MED ORDER — LEVALBUTEROL HCL 0.63 MG/3ML IN NEBU
0.6300 mg | INHALATION_SOLUTION | Freq: Four times a day (QID) | RESPIRATORY_TRACT | Status: DC
  Administered 2012-10-16 – 2012-10-17 (×6): 0.63 mg via RESPIRATORY_TRACT
  Filled 2012-10-16 (×9): qty 3

## 2012-10-16 MED ORDER — INSULIN GLARGINE 100 UNIT/ML ~~LOC~~ SOLN
20.0000 [IU] | Freq: Every day | SUBCUTANEOUS | Status: DC
  Administered 2012-10-16 – 2012-10-18 (×3): 20 [IU] via SUBCUTANEOUS

## 2012-10-16 NOTE — Progress Notes (Signed)
TELEMETRY: Reviewed telemetry pt in atrial fibrillation with slow response. Filed Vitals:   10/16/12 0503 10/16/12 0545 10/16/12 1005 10/16/12 1006  BP:  113/38  109/62  Pulse:  86 64   Temp:  97 F (36.1 C)    TempSrc:  Axillary    Resp:  18    Height:      Weight:  118 lb (53.524 kg)    SpO2: 98% 100%      Intake/Output Summary (Last 24 hours) at 10/16/12 1118 Last data filed at 10/16/12 1000  Gross per 24 hour  Intake   1440 ml  Output   1325 ml  Net    115 ml    SUBJECTIVE Still has cough. No chest pain. Mild dyspnea  LABS: Basic Metabolic Panel:  Recent Labs  19/14/78 1525 10/16/12 0558  NA 136 141  K 4.4 4.0  CL 90* 96  CO2 35* 39*  GLUCOSE 352* 92  BUN 65* 66*  CREATININE 0.90 1.01  CALCIUM 9.9 9.8   BNP: 1101 Dig level 0.6  Radiology/Studies:  Dg Chest 2 View  10/11/2012  *RADIOLOGY REPORT*  Clinical Data: Cough, tachycardia  CHEST - 2 VIEW  Comparison: 08/15/2012  Findings: Cardiomegaly again noted.  There is soft tissue prominence in the upper mediastinum stable from prior exam. Retrosternal goiter cannot be excluded.  Clinical correlation is necessary.  No acute infiltrate or pleural effusion.  No pulmonary edema. Mild thoracic spine osteopenia.  IMPRESSION: No active disease.  Cardiomegaly again noted.  No significant change.  Mild thoracic spine osteopenia.   Original Report Authenticated By: Natasha Mead, M.D.     Transthoracic Echocardiography  Patient: [name], [name] MR #: [id number] Study Date: 10/12/2012 Gender: F Age: 63 Height: Weight: BSA: Pt. Status: Room: CD07C  PERFORMING [place], [facility] ATTENDING Norins, Carney Harder SONOGRAPHER Nolon Rod, RDCS ADMITTING Elvera Lennox, [place] ORDERING Gherghe, Costin REFERRING Gherghe, Costin cc:  ------------------------------------------------------------ LV EF: 55% - 60%  ------------------------------------------------------------ Study Conclusions  - Left ventricle: The cavity  size was normal. Wall thickness was normal. Systolic function was normal. The estimated ejection fraction was in the range of 55% to 60%. - Aortic valve: There was mild stenosis. Valve area: 1.52cm^2(VTI). Valve area: 1.66cm^2 (Vmax). - Mitral valve: Probable flail segment to anterior leaflet with severe eccentric MR Valve area by continuity equation (using LVOT flow): 1.47cm^2. - Left atrium: The atrium was severely dilated. - Right atrium: The atrium was severely dilated. - Atrial septum: No defect or patent foramen ovale was identified. Transthoracic echocardiography. M-mode, complete 2D, spectral Doppler, and color Doppler. Patient status: Inpatient.  ------------------------------------------------------------  ------------------------------------------------------------ Left ventricle: The cavity size was normal. Wall thickness was normal. Systolic function was normal. The estimated ejection fraction was in the range of 55% to 60%.  ------------------------------------------------------------ Aortic valve: Doppler: There was mild stenosis. VTI ratio of LVOT to aortic valve: 0.54. Valve area: 1.52cm^2(VTI). Peak velocity ratio of LVOT to aortic valve: 0.58. Valve area: 1.66cm^2 (Vmax). Mean gradient: 10mm Hg (S). Peak gradient: 17mm Hg (S).  ------------------------------------------------------------ Mitral valve: Probable flail segment to anterior leaflet with severe eccentric MR Doppler: Valve area by pressure half-time: 3.28cm^2. Valve area by continuity equation (using LVOT flow): 1.47cm^2. Mean gradient: 8mm Hg (D). Peak gradient: 23mm Hg (D).  ------------------------------------------------------------ Left atrium: The atrium was severely dilated.  ------------------------------------------------------------ Atrial septum: No defect or patent foramen ovale was identified.  ------------------------------------------------------------ Right ventricle: The  cavity size was normal. Wall thickness was normal. Systolic function was normal.  ------------------------------------------------------------  Pulmonic valve: Doppler: Mild regurgitation.  ------------------------------------------------------------ Tricuspid valve: Doppler: Mild regurgitation.  ------------------------------------------------------------ Right atrium: The atrium was severely dilated.  ------------------------------------------------------------ Pericardium: The pericardium was normal in appearance.  ------------------------------------------------------------  2D measurements Normal Doppler measurements Normal Left ventricle LVOT LVID ED, 56.6 mm 43-52 Peak vel, S 122 cm/s ------ chord, PLAX VTI, S 24. cm ------ LVID ES, 30.4 mm 23-38 3 chord, PLAX Peak 6 mm ------ FS, chord, 46 % >29 gradient, S Hg PLAX Aortic valve LVPW, ED 10.5 mm ------ Peak vel, S 209 cm/s ------ IVS/LVPW 1.06 <1.3 Mean vel, S 149 cm/s ------ ratio, ED VTI, S 45. cm ------ Ventricular septum 4 IVS, ED 11.1 mm ------ Mean 10 mm ------ LVOT gradient, S Hg Diam, S 19 mm ------ Peak 17 mm ------ Area 2.84 cm^2 ------ gradient, S Hg Aorta VTI ratio 0.5 ------ Root diam, 34 mm ------ LVOT/AV 4 ED Area, VTI 1.5 cm^2 ------ Left atrium 2 AP dim 66 mm ------ Peak vel 0.5 ------ ratio, 8 LVOT/AV Area, Vmax 1.6 cm^2 ------ 6 Regurg PHT 353 ms ------ Mitral valve Peak E vel 189 cm/s ------ Peak A vel 59. cm/s ------ 2 Mean vel, D 125 cm/s ------ Deceleration 182 ms 150-23 time 0 Pressure 67 ms ------ half-time Mean 8 mm ------ gradient, D Hg Peak 23 mm ------ gradient, D Hg Peak E/A 3.2 ------ ratio Area (PHT) 3.2 cm^2 ------ 8 Area (LVOT) 1.4 cm^2 ------ continuity 7 Annulus VTI 46. cm ------ 9 Max regurg 560 cm/s ------ vel Regurg VTI 176 cm ------ Tricuspid valve Regurg peak 392 cm/s ------ vel Peak RV-RA 61 mm ------ gradient, S Hg Pulmonic valve Regurg vel, 204  cm/s ------ ED  ------------------------------------------------------------ Prepared and Electronically Authenticated by  [name] 2014-03-05T14:23:[id number]     PHYSICAL EXAM General: Elderly, thin, frail WF  in no acute distress. Head: Normal Neck: Supple Lungs: Bilateral rhonchi.  Heart: IRRR S1 S2 with 3/6 systolic murmur at the apex. Abdomen: Soft, non-tender, non-distended  Extremities: No edema.   Neuro: Alert and oriented X 3. Moves all extremities spontaneously.   ASSESSMENT AND PLAN: 1. Atrial fibrillation rate slow on coreg and digoxin. DC digoxin. Poor candidate for anticoagulation. Continue ASA 81 mg daily. 2. CAD. No further chest pain. Not a candidate for intervention. Continue ASA and beta blocker. 3. Severe MR with ruptured chord vs partially flail leaflet. Not a candidate for repair. Continue diuretics.  4. Acute bronchitis per primary team.  Active Problems:   DIABETES MELLITUS, TYPE II   HYPERTENSION   CORONARY ARTERY DISEASE   Atrial fibrillation   CONGESTIVE HEART FAILURE   Iron deficiency anemia   EKG, abnormal   Mitral insufficiency    Jeanella Flattery MD,FACC 10/16/2012 11:18 AM

## 2012-10-16 NOTE — Progress Notes (Signed)
Subjective: Congested cough, wheezing, RN thinks pt is tighter  Objective: Vital signs in last 24 hours: Temp:  [97 F (36.1 C)-97.8 F (36.6 C)] 97 F (36.1 C) (03/09 0545) Pulse Rate:  [64-86] 64 (03/09 1005) Resp:  [18-20] 18 (03/09 0545) BP: (109-154)/(38-62) 109/62 mmHg (03/09 1006) SpO2:  [97 %-100 %] 100 % (03/09 0545) Weight:  [53.524 kg (118 lb)] 53.524 kg (118 lb) (03/09 0545) Weight change: -0.045 kg (-1.6 oz) Last BM Date: 10/15/12  Intake/Output from previous day: 03/08 0701 - 03/09 0700 In: 1560 [P.O.:1560] Out: 900 [Urine:900] Intake/Output this shift: Total I/O In: 240 [P.O.:240] Out: 425 [Urine:425]  General appearance: alert and cooperative Resp: wheezes bilaterally Cardio: regular rate and rhythm, S1, S2 normal, no murmur, click, rub or gallop Extremities: extremities normal, atraumatic, no cyanosis or edema  Lab Results: No results found for this basename: WBC, HGB, HCT, PLT,  in the last 72 hours BMET  Recent Labs  10/15/12 1525 10/16/12 0558  NA 136 141  K 4.4 4.0  CL 90* 96  CO2 35* 39*  GLUCOSE 352* 92  BUN 65* 66*  CREATININE 0.90 1.01  CALCIUM 9.9 9.8    Studies/Results: Ct Angio Chest Pe W/cm &/or Wo Cm  10/14/2012  *RADIOLOGY REPORT*  Clinical Data: Short of breath, cough, evaluate for pulmonary embolism  CT ANGIOGRAPHY CHEST  Technique:  Multidetector CT imaging of the chest using the standard protocol during bolus administration of intravenous contrast. Multiplanar reconstructed images including MIPs were obtained and reviewed to evaluate the vascular anatomy.  Contrast: 60mL OMNIPAQUE IOHEXOL 350 MG/ML SOLN  Comparison: Chest x-ray 10/11/2012  Findings:  Mediastinum: Mildly enlarged, heterogeneous multinodular thyroid gland suggestive of goiter.  Further evaluation is limited on CT technique.  No suspicious mediastinal or hilar adenopathy.  A precarinal lymph node measures 8 mm in short axis.  Visualized thoracic esophagus is within  normal limits.  Heart/Vascular: Excellent opacification of pulmonary arteries to the subsegmental level no central filling defect to suggest acute pulmonary embolus.  The main central pulmonary arteries are enlarged.  The main pulmonary artery measures 3.7 cm in diameter. There is a bovine configuration of the aortic arch (two vessel arch with common origin of the brachiocephalic and left common carotid arteries), a normal anatomic variant.  Aneurysmal dilatation of the ascending thoracic aorta to 4.6 cm.  The transverse thoracic aorta is mildly ectatic at 3.8 cm in diameter.  The descending thoracic aorta is mildly ectatic at 3.6 cm in diameter.  Scattered atherosclerotic vascular calcifications are noted throughout. Atherosclerotic vascular calcifications also noted throughout the coronary arteries.  There is marked cardiomegaly with left greater than the right by atrial enlargement.  Contrast material refluxes into the suprahepatic IVC and hepatic veins suggesting right heart strain.  Query mild thickening of the aortic valve leaflets. Calcification is noted in the mitral valve annulus.  No pericardial effusion.  Lungs/Pleura:  The lungs are mildly hyperinflated.  There is diffuse mild bronchial wall thickening.  Respiratory motion in the bases limits evaluation for small pulmonary nodules.  There is a 4 mm nodule the periphery of the right lower lobe.  Linear atelectasis versus scarring noted inferomedially in the right upper and middle lobes as well as in the inferior lingula.  No focal consolidation.  No pleural effusion or pneumothorax.  Upper Abdomen: Low attenuation lesion with probable rim calcification in the upper pole of the right kidney measures up to 2 cm.  This is incompletely evaluated but statistically highly likely  a cyst.  Otherwise, limited imaging of the upper abdomen is unremarkable.  Bones: No acute fracture or aggressive appearing lytic or blastic osseous lesion.  IMPRESSION:  1.  Negative  for pulmonary embolism, pneumonia or other acute pulmonary process.  2.  Marked cardiomegaly with left greater than right biatrial enlargement.  Reflux of contrast material into the suprahepatic IVC and hepatic veins suggest underlying right heart strain.  3.  Aneurysmal dilatation of the ascending thoracic aorta to 4.6 cm.  4.  Suggestion of thickening of the aortic valve cusps raises the question of aortic stenosis as the underlying etiology of the ascending aortic aneurysmal dilatation.  This could be further evaluated by echocardiography if clinically warranted.  5.  Marked enlargement of the main central pulmonary arteries suggesting underlying pulmonary arterial hypertension.  6.  Atherosclerosis including coronary artery disease  7.  Pulmonary hyperinflation and mild diffuse bronchial wall thickening suggests COPD.  8.  Small 4 mm nodule in the right lower lobe. If the patient is at high risk for bronchogenic carcinoma, follow-up chest CT at 1 year is recommended.  If the patient is at low risk, no follow-up is needed.  This recommendation follows the consensus statement: Guidelines for Management of Small Pulmonary Nodules Detected on CT Scans:  A Statement from the Fleischner Society as published in Radiology 2005; 237:395-400.   Original Report Authenticated By: Malachy Moan, M.D.     Medications: I have reviewed the patient's current medications.  Assessment/Plan: Active Problems:  CHF , continue current medical therapy/lasix bid.  Digoxin stopped by cardiology for slow heart rate Viral Bronchitis not improved and possibly a little worse today, raise prednisone to 60 mg daily,  xopenex to q6/mucinex for cough and wheezing. Ambulate and check RA O2 sat today DM some high sugars, increase lantus    LOS: 5 days   [name],[name] 10/16/2012, 11:28 AM

## 2012-10-16 NOTE — Progress Notes (Signed)
Pts o2 sat on 2l-96%. o2 removed and sat 92%. Pt ambulated in hallway with sats dropping to 88%.

## 2012-10-17 ENCOUNTER — Inpatient Hospital Stay (HOSPITAL_COMMUNITY): Payer: Medicare Other

## 2012-10-17 LAB — CBC
Platelets: 157 10*3/uL (ref 150–400)
RBC: 2.87 MIL/uL — ABNORMAL LOW (ref 3.87–5.11)
WBC: 8.5 10*3/uL (ref 4.0–10.5)

## 2012-10-17 LAB — GLUCOSE, CAPILLARY
Glucose-Capillary: 105 mg/dL — ABNORMAL HIGH (ref 70–99)
Glucose-Capillary: 360 mg/dL — ABNORMAL HIGH (ref 70–99)

## 2012-10-17 LAB — PRO B NATRIURETIC PEPTIDE: Pro B Natriuretic peptide (BNP): 1303 pg/mL — ABNORMAL HIGH (ref 0–450)

## 2012-10-17 LAB — BASIC METABOLIC PANEL
Calcium: 9.5 mg/dL (ref 8.4–10.5)
Calcium: 9.4 mg/dL (ref 8.4–10.5)
GFR calc non Af Amer: 59 mL/min — ABNORMAL LOW (ref >90)
GFR calc non Af Amer: 48 mL/min — ABNORMAL LOW (ref >90)
Sodium: 140 mEq/L (ref 135–145)
Sodium: 137 mEq/L (ref 135–145)

## 2012-10-17 MED ORDER — LEVALBUTEROL HCL 0.63 MG/3ML IN NEBU
0.6300 mg | INHALATION_SOLUTION | Freq: Two times a day (BID) | RESPIRATORY_TRACT | Status: DC
  Administered 2012-10-18 – 2012-10-19 (×3): 0.63 mg via RESPIRATORY_TRACT
  Filled 2012-10-17 (×4): qty 3

## 2012-10-17 NOTE — Clinical Documentation Improvement (Signed)
RESPIRATORY FAILURE DOCUMENTATION CLARIFICATION QUERY   CLINICAL DOCUMENTATION QUERIES ARE NOT PART OF THE PERMANENT MEDICAL RECORD  Please update your documentation within the medical record to reflect your response to this query.                                                                                     10/17/12  Dr. Debby Bud and/or Associates,  In a better effort to capture your patient's severity of illness, reflect appropriate length of stay and utilization of resources, a review of the patient medical record has revealed the following indicators:   - Patient continues to desaturate with exertion off of 02 into the mid 80's.   - 02 Saturations recover to the low to mid 90's when 02 is reapplied.   - Patient's current age is 79 years old.   - Patient has a known history of severe MR and is not a surgical candidate.      Based on your clinical judgment, please document in the progress notes and discharge summary if a condition below provides greater specificity regarding the patient's respiratory status:   - Chronic Hypoxic Respiratory Failure   - Other Condition   - Unable to Clinically Determine  In responding to this query please exercise your independent judgment.    The fact that a query is asked, does not imply that any particular answer is desired or expected.    Reviewed: additional documentation in the medical record. Patient has documentation in the chart as to the evolving problem with diastolic heart failure as well as viral asthmatic bronchitis, the principle causes for her symptoms and hypoxemia  Thank You,  Jerral Ralph  RN BSN CCDS Certified Clinical Documentation Specialist: Cell   [phone]  Health Information Management [place]  TO RESPOND TO THE THIS QUERY, FOLLOW THE INSTRUCTIONS BELOW:  1. If needed, update documentation for the patient's encounter via the notes activity.  2. Access this query again and click edit on the In  [employer].  3. After updating, or not, click F2 to complete all highlighted (required) fields concerning your review. Select "additional documentation in the medical record" OR "no additional documentation provided".  4. Click Sign note button.  5. The deficiency will fall out of your In Basket *Please let us know if you are not able to complete this workflow by phone or e-mail (listed below).

## 2012-10-17 NOTE — Progress Notes (Signed)
Subjective: Over the w/e [name] has continued to be weak, have trouble with clearing secretions and been short of breath. She has been continued on prednisone and breathing treatments. No report of fever, chills or purulent sputum. She reports taht she feels weak, nauseated and short of breath.  Objective: Lab: Lab Results  Component Value Date   WBC 8.5 10/17/2012   HGB 8.0* 10/17/2012   HCT 25.3* 10/17/2012   MCV 88.2 10/17/2012   PLT 157 10/17/2012   BMET    Component Value Date/Time   NA 135 10/16/2012 1630   K 4.7 10/16/2012 1630   CL 92* 10/16/2012 1630   CO2 36* 10/16/2012 1630   GLUCOSE 327* 10/16/2012 1630   BUN 66* 10/16/2012 1630   CREATININE 1.08 10/16/2012 1630   CALCIUM 9.8 10/16/2012 1630   GFRNONAA 44* 10/16/2012 1630   GFRAA 51* 10/16/2012 1630     Imaging:  Scheduled Meds: . amLODipine  5 mg Oral Daily  . aspirin EC  81 mg Oral Daily  . carvedilol  3.125 mg Oral BID WC  . enoxaparin (LOVENOX) injection  30 mg Subcutaneous Q24H  . feeding supplement  237 mL Oral TID BM  . ferrous sulfate  325 mg Oral Q breakfast  . furosemide  60 mg Oral BID  . guaiFENesin  600 mg Oral BID  . insulin aspart  0-9 Units Subcutaneous TID WC  . insulin glargine  20 Units Subcutaneous QHS  . levalbuterol  0.63 mg Nebulization Q6H  . potassium chloride  20 mEq Oral Daily  . predniSONE  60 mg Oral Q breakfast  . simvastatin  20 mg Oral Daily  . sodium chloride  3 mL Intravenous Q12H  . traMADol  50 mg Oral BID   Continuous Infusions:  PRN Meds:.acetaminophen, chlorpheniramine-HYDROcodone, LORazepam, ondansetron, temazepam   Physical Exam: Filed Vitals:   10/17/12 0519  BP: 142/40  Pulse: 79  Temp: 97.3 F (36.3 C)  Resp: 21  O2 Sat 92% on RA 10/16/12. With ambulation O2 sat dropped to 88% [name] in no acute distress but feels bad HEENT- C&Sclear Cor - 2+ radial, IRIR rate controlled. III/VI mumur systolic PUlm - no increased WOB, lungs with feint rhonchi w/o  rales Neuro - awake and alert, a little lethargic     Assessment/Plan: 1. CArdiac - stable. No clinical evidence of active CHF. 2 D echo with EF 55%  Plan Continue present medications  D/c tele  F/u BNP  2. Pulmonary - persistent bronchitis but sats are improving.  Plan Continue present regimen  PA -Lat CXR  3. F/E/N - persistently elevated BUN but normal Cr. PO intake is OK but low.  4. DM -  CBG (last 3)   Recent Labs  10/16/12 1721 10/16/12 2121 10/17/12 0629  GLUCAP 292* 347* 105*    Have been judicious about not running serum glucose too low. Lantus was increased - will have to monitor closely.  5. MDS - Hgb down to 8.0 more her normal range.    [name] [name] IM (o) [phone]; (c) [phone] Call-grp - [name] IM  Tele: [phone]  10/17/2012, 6:43 AM

## 2012-10-17 NOTE — Progress Notes (Signed)
Physical Therapy Treatment Patient Details Name: [name] MRN: [id number] DOB: Apr 30, 1920 Today's Date: 10/17/2012 Time: 4098-1191 PT Time Calculation (min): 13 min  PT Assessment / Plan / Recommendation Comments on Treatment Session  Patient able to walk, but still c/o dizzy with ambulation.  Vitals with ambulation with increased HR.  Still needs increased time and cues for safety and supervision at least for safety.  Recommend STSNF level therapies.    Follow Up Recommendations  SNF;Supervision/Assistance - 24 hour     Does the patient have the potential to tolerate intense rehabilitation   N/A  Barriers to Discharge  None      Equipment Recommendations  None recommended by PT    Recommendations for Other Services  None  Frequency Min 3X/week   Plan Discharge plan remains appropriate    Precautions / Restrictions Precautions Precautions: Fall   Pertinent Vitals/Pain Left hip 6/10 with ambulation    Mobility  Bed Mobility Supine to Sit: 5: Supervision;HOB elevated Details for Bed Mobility Assistance: pt sitting on edge of bed Transfers Sit to Stand: 5: Supervision;From bed;With upper extremity assist;From chair/3-in-1 Stand to Sit: 5: Supervision;To chair/3-in-1;To bed;With upper extremity assist Stand Pivot Transfers: 4: Min guard;With armrests Details for Transfer Assistance: transferred to 3:1 with walker and minguard assist prior to ambulation Ambulation/Gait Ambulation/Gait Assistance: 4: Min guard Ambulation Distance (Feet): 120 Feet Assistive device: Rolling walker Ambulation/Gait Assistance Details: leg length discrepancy on left with increased UE assist for left weight bearing.  fatigues quickly with ambulation. Gait Pattern: Step-to pattern;Decreased weight shift to left;Antalgic      PT Goals Acute Rehab PT Goals Pt will go Sit to Stand: with modified independence PT Goal: Sit to Stand - Progress: Progressing toward goal Pt will go Stand to Sit:  with modified independence PT Goal: Stand to Sit - Progress: Progressing toward goal Pt will Ambulate: 16 - 50 feet;with modified independence;with least restrictive assistive device PT Goal: Ambulate - Progress: Progressing toward goal  Visit Information  Last PT Received On: 10/17/12    Subjective Data  Subjective: I guess I feel a little better   Cognition  Cognition Overall Cognitive Status: Appears within functional limits for tasks assessed/performed Arousal/Alertness: Awake/alert Orientation Level: Appears intact for tasks assessed Behavior During Session: [facility] for tasks performed    Balance  Balance Balance Assessed: Yes Static Standing Balance Static Standing - Balance Support: Bilateral upper extremity supported Static Standing - Level of Assistance: 5: Stand by assistance Static Standing - Comment/# of Minutes: few moments prior to ambulation holding onto walker  End of Session PT - End of Session Equipment Utilized During Treatment: Gait belt Activity Tolerance: Patient limited by fatigue Patient left: in bed;with call bell/phone within reach   GP     [facility] 10/17/2012, 4:33 PM [place], [place] [phone] 10/17/2012

## 2012-10-17 NOTE — Progress Notes (Signed)
Physical Therapy Treatment Patient Details Name: [name] MRN: [id number] DOB: 12-Sep-1919 Today's Date: 10/17/2012 Time: 1218-1226 PT Time Calculation (min): 8 min  PT Assessment / Plan / Recommendation Comments on Treatment Session  Patient with limited activity tolerance.  Seems generally weak and may benefit from STSNF for more frequent therapy prior to return to ALF as needing assistance for safety with all mobility and with deconditioning due to illness and hospitalization.    Follow Up Recommendations  SNF;Supervision/Assistance - 24 hour     Does the patient have the potential to tolerate intense rehabilitation   N/A  Barriers to Discharge  None      Equipment Recommendations  None recommended by PT    Recommendations for Other Services  None  Frequency Min 3X/week   Plan Discharge plan needs to be updated    Precautions / Restrictions Precautions Precautions: Fall   Pertinent Vitals/Pain No current pain complaints    Mobility  Bed Mobility Supine to Sit: 5: Supervision;HOB elevated Transfers Sit to Stand: From bed;With upper extremity assist;4: Min guard Stand to Sit: 5: Supervision;To chair/3-in-1;With armrests Stand Pivot Transfers: 4: Min guard;With armrests Details for Transfer Assistance: transferred to chair for lunch; limited activity tolerance due to dizziness today.      PT Goals Acute Rehab PT Goals Pt will go Sit to Stand: with modified independence PT Goal: Sit to Stand - Progress: Progressing toward goal Pt will go Stand to Sit: with modified independence PT Goal: Stand to Sit - Progress: Progressing toward goal  Visit Information  Last PT Received On: 10/17/12    Subjective Data  Subjective: I feel dizzy today.  Don't feel well.   Cognition  Cognition Overall Cognitive Status: Appears within functional limits for tasks assessed/performed Arousal/Alertness: Awake/alert Orientation Level: Appears intact for tasks assessed Behavior  During Session: [facility] for tasks performed       End of Session PT - End of Session Equipment Utilized During Treatment: Oxygen Activity Tolerance: Patient limited by fatigue Patient left: in chair;with family/visitor present;with call bell/phone within reach   GP     [facility] 10/17/2012, 4:20 PM [place], [place] [phone] 10/17/2012

## 2012-10-18 LAB — GLUCOSE, CAPILLARY
Glucose-Capillary: 319 mg/dL — ABNORMAL HIGH (ref 70–99)
Glucose-Capillary: 401 mg/dL — ABNORMAL HIGH (ref 70–99)

## 2012-10-18 LAB — BASIC METABOLIC PANEL
CO2: 39 mEq/L — ABNORMAL HIGH (ref 19–32)
CO2: 39 mEq/L — ABNORMAL HIGH (ref 19–32)
Chloride: 97 mEq/L (ref 96–112)
Chloride: 89 mEq/L — ABNORMAL LOW (ref 96–112)
Creatinine, Ser: 0.89 mg/dL (ref 0.50–1.10)
Creatinine, Ser: 0.99 mg/dL (ref 0.50–1.10)
Potassium: 4.1 mEq/L (ref 3.5–5.1)

## 2012-10-18 MED ORDER — METOLAZONE 2.5 MG PO TABS
2.5000 mg | ORAL_TABLET | Freq: Every day | ORAL | Status: DC
  Administered 2012-10-18 – 2012-10-19 (×2): 2.5 mg via ORAL
  Filled 2012-10-18 (×4): qty 1

## 2012-10-18 MED ORDER — CARVEDILOL 6.25 MG PO TABS
6.2500 mg | ORAL_TABLET | Freq: Two times a day (BID) | ORAL | Status: DC
  Administered 2012-10-18 – 2012-10-19 (×3): 6.25 mg via ORAL
  Filled 2012-10-18 (×6): qty 1

## 2012-10-18 NOTE — Progress Notes (Addendum)
DC plan has changed;  PT now recommends short term SNF.  Patient and her son Tammy Sours were agreeable to bed search but hoped to return to [facility] where patient has been a resident before. Referral sent to facility and bed offer given. CSW contacted Dr. Debby Bud who feels that patient will be medically stable for d/c tomorrow to SNF. Son or daughter will go to facility today to sign admit papers. Signed FL2 is on chart. De Blanch- Administrator of [facility] is aware of above and will work with patient and family if/when medically stable to return.  Lorri Frederick. [place]  [phone]  .

## 2012-10-18 NOTE — Progress Notes (Signed)
NUTRITION FOLLOW UP  Intervention:   1. Continue Glucerna shakes TID  2. RD will continue to follow    Nutrition Dx:   Inadequate oral intake related to increased needs with illness as evidenced by weight loss.   Goal:   PO intake to meet >/=90% estimated nutrition needs. Likely met  Monitor:   PO intake, weight trends, I/O's, labs   Assessment:   Pt continues on lasix for diureses, CXR with signs of pulmonary edema. Planned to d/c to SNF when able.  Pt states she is eating well, most meals >50% completions. Pt drinking Glucerna shakes TID. Agreeable to continue.    Height: Ht Readings from Last 1 Encounters:  10/11/12 5\' 1"  (1.549 m)    Weight Status:   Wt Readings from Last 1 Encounters:  10/18/12 115 lb 12.8 oz (52.527 kg)  trending down with fluids off   Re-estimated needs:  Kcal: 1300-1500  Protein: 55-65 gm  Fluid: 1.2 L/day   Skin: intact   Diet Order: Cardiac   Intake/Output Summary (Last 24 hours) at 10/18/12 1005 Last data filed at 10/18/12 0900  Gross per 24 hour  Intake    960 ml  Output   1800 ml  Net   -840 ml  -4 L this admission   Last BM: 3/9   Labs:   Recent Labs Lab 10/17/12 0602 10/17/12 1515 10/18/12 0520  NA 140 137 142  K 3.7 4.5 3.6  CL 96 93* 97  CO2 37* 34* 39*  BUN 58* 55* 52*  CREATININE 0.84 1.00 0.89  CALCIUM 9.5 9.4 9.5  GLUCOSE 108* 397* 131*    CBG (last 3)   Recent Labs  10/17/12 1623 10/17/12 2123 10/18/12 0621  GLUCAP 373* 360* 115*    Scheduled Meds: . amLODipine  5 mg Oral Daily  . aspirin EC  81 mg Oral Daily  . carvedilol  6.25 mg Oral BID WC  . enoxaparin (LOVENOX) injection  30 mg Subcutaneous Q24H  . feeding supplement  237 mL Oral TID BM  . ferrous sulfate  325 mg Oral Q breakfast  . furosemide  60 mg Oral BID  . guaiFENesin  600 mg Oral BID  . insulin aspart  0-9 Units Subcutaneous TID WC  . insulin glargine  20 Units Subcutaneous QHS  . levalbuterol  0.63 mg Nebulization BID  .  metolazone  2.5 mg Oral Daily  . potassium chloride  20 mEq Oral Daily  . predniSONE  60 mg Oral Q breakfast  . simvastatin  20 mg Oral Daily  . sodium chloride  3 mL Intravenous Q12H  . traMADol  50 mg Oral BID    Continuous Infusions:   Clarene Duke RD, LDN Pager [phone] After Hours pager [phone]

## 2012-10-18 NOTE — Progress Notes (Signed)
10/18/12 Patient blood glucose 401. MD notified, no new orders received. Anastasia Fiedler RN

## 2012-10-18 NOTE — Progress Notes (Signed)
Subjective: [name] is awake and alert. She isn't sure how she feels but she does feel weak. PT notes reviewed.  Objective: Lab: Lab Results  Component Value Date   WBC 8.5 10/17/2012   HGB 8.0* 10/17/2012   HCT 25.3* 10/17/2012   MCV 88.2 10/17/2012   PLT 157 10/17/2012   BMET    Component Value Date/Time   NA 142 10/18/2012 0520   K 3.6 10/18/2012 0520   CL 97 10/18/2012 0520   CO2 39* 10/18/2012 0520   GLUCOSE 131* 10/18/2012 0520   BUN 52* 10/18/2012 0520   CREATININE 0.89 10/18/2012 0520   CALCIUM 9.5 10/18/2012 0520   GFRNONAA 55* 10/18/2012 0520   GFRAA 64* 10/18/2012 0520     Imaging: CXR 3/1-/14: IMPRESSION:  Stable cardiomegaly. Question minimal pulmonary vascular  congestion.  Scheduled Meds: . amLODipine  5 mg Oral Daily  . aspirin EC  81 mg Oral Daily  . carvedilol  3.125 mg Oral BID WC  . enoxaparin (LOVENOX) injection  30 mg Subcutaneous Q24H  . feeding supplement  237 mL Oral TID BM  . ferrous sulfate  325 mg Oral Q breakfast  . furosemide  60 mg Oral BID  . guaiFENesin  600 mg Oral BID  . insulin aspart  0-9 Units Subcutaneous TID WC  . insulin glargine  20 Units Subcutaneous QHS  . levalbuterol  0.63 mg Nebulization BID  . potassium chloride  20 mEq Oral Daily  . predniSONE  60 mg Oral Q breakfast  . simvastatin  20 mg Oral Daily  . sodium chloride  3 mL Intravenous Q12H  . traMADol  50 mg Oral BID   Continuous Infusions:  PRN Meds:.acetaminophen, chlorpheniramine-HYDROcodone, LORazepam, ondansetron, temazepam   Physical Exam: Filed Vitals:   10/18/12 0532  BP: 134/47  Pulse: 64  Temp: 98 F (36.7 C)  Resp: 20   Very elderly white woman in no distress HEENT - no change Cor - 2+ radial, IRIR rate controlled. III/VI systolic murmur PUlm - no increased WOB, no wheezing Abd - soft Neuro - awake and alert.      Assessment/Plan: 1. Cardiac - BNP is elevated. CXR with signs of pulmonary edema Plan  Increase diuretics to mobilize  fluid  2. Pulmonary - no fever, no sputum production, no infiltrate on CXR, no cough.  3. F/E/N - BUN improved, Creatinine remains normal  4. End of Life care - spoke, again, to the patient about ACP and the consequences of full resuscitation including low probability of success and the aftermath. She really doesn't want to think about it and want all the care she can have.   Dispo - based on continued need for close medical management, PT assessment and the patient's failure to improve she is a candidate for SNF   [employer] IM (o) [phone]; (c) [phone] Call-grp - [name] IM  Tele: [phone]  10/18/2012, 7:18 AM

## 2012-10-18 NOTE — Progress Notes (Signed)
Inpatient Diabetes Program Recommendations  AACE/ADA: New Consensus Statement on Inpatient Glycemic Control (2013)  Target Ranges:  Prepandial:   less than 140 mg/dL      Peak postprandial:   less than 180 mg/dL (1-2 hours)      Critically ill patients:  140 - 180 mg/dL   Inpatient Diabetes Program Recommendations Insulin - Basal: xxx Correction (SSI): Please increase to moderate tidwc Insulin - Meal Coverage: xxx HgbA1C: Need updated HgbA1C to assess glycemic control prior to hospitalization ( last one 7.0% on 06/24/12)  Thank you, Lenor Coffin, RN, CNS, Diabetes Coordinator [phone])

## 2012-10-19 DIAGNOSIS — E785 Hyperlipidemia, unspecified: Secondary | ICD-10-CM

## 2012-10-19 DIAGNOSIS — D509 Iron deficiency anemia, unspecified: Secondary | ICD-10-CM

## 2012-10-19 LAB — GLUCOSE, CAPILLARY: Glucose-Capillary: 346 mg/dL — ABNORMAL HIGH (ref 70–99)

## 2012-10-19 LAB — BASIC METABOLIC PANEL
Calcium: 10.1 mg/dL (ref 8.4–10.5)
GFR calc Af Amer: 69 mL/min — ABNORMAL LOW (ref >90)
GFR calc non Af Amer: 59 mL/min — ABNORMAL LOW (ref >90)
Glucose, Bld: 75 mg/dL (ref 70–99)
Potassium: 2.8 mEq/L — ABNORMAL LOW (ref 3.5–5.1)
Sodium: 143 mEq/L (ref 135–145)

## 2012-10-19 MED ORDER — ASPIRIN 81 MG PO TBEC: 81.0000 mg | DELAYED_RELEASE_TABLET | Freq: Every day | ORAL | Status: DC

## 2012-10-19 MED ORDER — INSULIN GLARGINE 100 UNIT/ML ~~LOC~~ SOLN: 20.0000 [IU] | Freq: Every day | SUBCUTANEOUS | Status: DC

## 2012-10-19 MED ORDER — HYDROCOD POLST-CHLORPHEN POLST 10-8 MG/5ML PO LQCR: 5.0000 mL | Freq: Two times a day (BID) | ORAL | Status: DC | PRN

## 2012-10-19 MED ORDER — ACETAMINOPHEN 325 MG PO TABS: 650.0000 mg | ORAL_TABLET | ORAL | Status: DC | PRN

## 2012-10-19 MED ORDER — CARVEDILOL 6.25 MG PO TABS: 6.2500 mg | ORAL_TABLET | Freq: Two times a day (BID) | ORAL | Status: DC

## 2012-10-19 MED ORDER — AMLODIPINE BESYLATE 5 MG PO TABS
5.0000 mg | ORAL_TABLET | Freq: Every day | ORAL | Status: DC
Start: 2012-10-19 — End: 2013-06-29

## 2012-10-19 MED ORDER — GUAIFENESIN ER 600 MG PO TB12: 600.0000 mg | ORAL_TABLET | Freq: Two times a day (BID) | ORAL | Status: DC

## 2012-10-19 NOTE — Discharge Summary (Signed)
[name], [name]              ACCOUNT NO.:  [id number]  MEDICAL RECORD NO.:  [id number]  LOCATION:  4737                         FACILITY:  [facility]  PHYSICIAN:  Rosalyn Gess. Norins, MD  DATE OF BIRTH:  [birth year]  DATE OF ADMISSION:  10/11/2012 DATE OF DISCHARGE:                              DISCHARGE SUMMARY   ADDENDUM:  Please refer to the prior discharge summary and addendums for the patient's hospital course to date.  INTERVAL HISTORY:  The patient has continued to have problems with shortness of breath.  She had persistent elevation of her BNP to 1303 as of October 17, 2012.  The patient had increased diuresis with increasing her medications including metolazone 2.5 mg q.a.m.  Today, the patient has had a total diuresis of 6.3 L.  The patient reports that she feels weak, still has problems being short of breath and has no energy.  There has been no other events reported by nursing staff.  DISCHARGE PHYSICAL EXAMINATION:  VITAL SIGNS:  Temperature was 98, blood pressure was 150/86, pulse was 87, respirations 20, oxygen saturation on 2 L is 100%. GENERAL APPEARANCE:  This is a very elderly Caucasian woman, in no acute distress, a little bit disheveled given her usual attention to her appearance. HEENT:  Conjunctivae and sclerae were clear.  Pupils equal, round, and reactive. NECK:  Supple. CHEST:  The patient has breath sounds that are obscured by very loud systolic murmur transmitted to her back.  She had no frank wheezes, no frank rales.  She had no increased work of breathing.  She is noted to have a wet cough. CARDIOVASCULAR:  2+ radial pulse.  Her precordium was quiet.  She had a palpable thrill.  She has a 3-4/6 systolic murmur worse at the right sternal border but also present across precordium.  Her heart rate is regular on today's exam. BREAST:  Deferred. ABDOMEN:  Bowel sounds are positive in all 4 quadrants.  Abdomen is soft.  There is no guarding or  rebound. PELVIC:  Deferred. RECTAL:  Deferred. EXTREMITIES:  No signs of cyanosis.  No pitting edema is noted. NEURO:  The patient is awake.  Her speech is clear.  She seems oriented to person, place, and context.  LABORATORY RESULTS:  Chemistry from October 18, 2012, at 4 p.m. with a sodium of 137, potassium 4.1, chloride of 89, CO2 of 39, BUN of 51, creatinine 0.99, glucose was 395.  ProBNP was 1303.  Troponins earlier this admission were negative x4.  The patient's last iron studies in December 2013 with an iron of 19, TIBC was 465.  Iron saturation was 4, ferritin was 20.  Last B12 from October 2013 1188.  Final CBC from October 17, 2012, with a white count of 8500, hemoglobin of 8 g close to her baseline, hematocrit 25.3%, platelet count 157,000.  Digoxin level October 11, 2012 was 0.6.  Last A1c from June 24, 2012, was 7%.  Last TSH from August 2013 was 3.9.  Final chest x-ray from October 17, 2012, reveals stable cardiomegaly.  Question minimal pulmonary vascular congestion.  ASSESSMENT AND PLAN: 1. Cardiac.  The patient with persistent mild congestive heart  failure.  She does have severe coronary artery disease, and severe     valve disease but is not a candidate for aggressive intervention.     Cardiology has seen her and feels that supportive medical care is     the best treatment.  The patient has had a significant diuresis     during this admission.  Plan will be to continue her current     medications listed at the end of this dictation. 2. Pulmonary.  The patient presented with what appeared to be a viral     asthmatic bronchitis.  She initially had been treated with     antibiotics but with the absence of white count elevation,     productive sputum, antibiotics were discontinued.  The patient had     been given Solu-Medrol initially but currently is on oral     prednisone and would continue this until she is stable and then     taper her off slowly.  Recommending 40  mg daily for 3 days, 20 mg     daily for 3 days, 10 mg daily for 6, and then discontinue if her     oxygen saturations are normal. 3. Fluids, electrolytes.  The patient has been doing well.  Her last     potassium was normal. 4. Diabetes.  The patient had a well-controlled A1c is noted.  She has     intentionally been allowed to run an elevated serum glucose to     avoid episodes of hypoglycemia which had occurred previously.  She     appears to be stable on her current dose of Lantus and will     continue that.  She will also continue her other medications as     listed below.  She does need to follow a low carb, no sugar diet. 5. Myelodysplastic syndrome.  The patient has chronic myelodysplastic     syndrome with chronic anemia.  She usually runs a hemoglobin     between 7.5 and 10.  Transfusion threshold of hemoglobin less than     7.  We worked to try to avoid transfusions in this patient.  She     did have a transient elevation in hemoglobin, but her final     hemoglobin is closer to her baseline.  Would recommend that she     have a followup H and H in approximately 4 weeks or if she becomes     more symptomatic.  CODE STATUS:  Did discuss this with the patient on October 18, 2012, and also discussed it with her son who has her power of attorney.  The patient is adamant as she wants all available treatment.  She does not want to discuss end of life care or advance care planning.  Her son is aware of this  although he does support DNR status given the patient's adamant insistence on full care.  At this time, she is a full code.  MEDICATIONS AT THE TIME OF DISCHARGE: 1. Acetaminophen 650 mg q. 4 p.r.n. 2. Amlodipine 5 mg daily. 3. Aspirin 81 mg daily. 4. Carvedilol 6.25 mg b.i.d. 5. Tussionex 5 mL every 12 hours p.r.n. coughing. 6. Glucerna 237 mL 3 times daily. 7. Iron 325 mg daily. 8. Lasix 60 mg b.i.d. 9. Guaifenesin 600 mg b.i.d. 10.Insulin glargine 20 units subcu  q.p.m. 11.Ativan 0.5 mg q.8 p.r.n. 12.Zaroxolyn 2.5 mg Monday, Wednesday, Friday in the a.m. prior to     first dose  of Lasix. 13.Ondansetron 4 mg p.o. q.6 p.r.n. 14.Potassium 20 mEq p.o. daily. 15.Prednisone currently at 60 mg daily, and would begin taper at the     time of transfer to skilled care facility. 16.Simvastatin 20 mg daily. 17.Restoril 7.5 mg at bedtime. 18.Tramadol 50 mg q.12 p.r.n. pain.  DISPOSITION:  The patient to be transferred to a skilled care facility for rehabilitation for inpatient PT and OT recommendations.  Long-term plan would be for the patient either return to assisted living, or possibly home with 24-hour day supervision.  The patient's prognosis is extremely guarded given her advanced age, known history of severe CAD, severe valvular disease, and myelodysplastic syndrome.     Rosalyn Gess Norins, MD     MEN/MEDQ  D:  10/19/2012  T:  10/19/2012  Job:  [id number]

## 2012-10-19 NOTE — Clinical Social Work Placement (Addendum)
    Clinical Social Work Department CLINICAL SOCIAL WORK PLACEMENT NOTE 10/19/2012  Patient:  [name], [name]  Account Number:  [id number] Admit date:  10/11/2012  Clinical Social Worker:  Lupita Leash CROWDER, LCSWA  Date/time:  10/17/2012 05:00 PM  Clinical Social Work is seeking post-discharge placement for this patient at the following level of care:   SKILLED NURSING   (*CSW will update this form in Epic as items are completed)   10/17/2012  Patient/family provided with [facility] Department of Clinical Social Work's list of facilities offering this level of care within the geographic area requested by the patient (or if unable, by the patient's family).  10/17/2012  Patient/family informed of their freedom to choose among providers that offer the needed level of care, that participate in Medicare, Medicaid or managed care program needed by the patient, have an available bed and are willing to accept the patient.  10/17/2012  Patient/family informed of [facility]' ownership interest in [facility], as well as of the fact that they are under no obligation to receive care at this facility.  PASARR submitted to EDS on  PASARR number received from EDS on   FL2 transmitted to all facilities in geographic area requested by pt/family on  10/17/2012 FL2 transmitted to all facilities within larger geographic area on   Patient informed that his/her managed care company has contracts with or will negotiate with  certain facilities, including the following:   Has existing PASARR number     Patient/family informed of bed offers received:  10/18/12 Patient chooses bed at [facility]  Physician recommends and patient chooses bed at    Patient to be transferred to [facility] on 10/19/12    Patient to be transferred to facility by [facility])  The following physician request were entered in Epic:   Additional Comments: Ok per MD for d/c today to SNF.  Patient and son are  agreeable to d/c plan. Son will notify his sister Jasmine December of d/c. Pts nurse notified of d/c. No further CSW needs identified. CSW signing off.Marland Kitchen

## 2012-10-19 NOTE — Progress Notes (Signed)
Pt to betrans to [facility] today- report given to nurse Sherry Ruffing

## 2012-10-19 NOTE — Progress Notes (Signed)
Subjective: Feels weak.  Objective: Lab: Lab Results  Component Value Date   WBC 8.5 10/17/2012   HGB 8.0* 10/17/2012   HCT 25.3* 10/17/2012   MCV 88.2 10/17/2012   PLT 157 10/17/2012   BMET    Component Value Date/Time   NA 137 10/18/2012 1600   K 4.1 10/18/2012 1600   CL 89* 10/18/2012 1600   CO2 39* 10/18/2012 1600   GLUCOSE 395* 10/18/2012 1600   BUN 51* 10/18/2012 1600   CREATININE 0.99 10/18/2012 1600   CALCIUM 9.6 10/18/2012 1600   GFRNONAA 49* 10/18/2012 1600   GFRAA 56* 10/18/2012 1600     Imaging:  Scheduled Meds: . amLODipine  5 mg Oral Daily  . aspirin EC  81 mg Oral Daily  . carvedilol  6.25 mg Oral BID WC  . enoxaparin (LOVENOX) injection  30 mg Subcutaneous Q24H  . feeding supplement  237 mL Oral TID BM  . ferrous sulfate  325 mg Oral Q breakfast  . furosemide  60 mg Oral BID  . guaiFENesin  600 mg Oral BID  . insulin aspart  0-9 Units Subcutaneous TID WC  . insulin glargine  20 Units Subcutaneous QHS  . levalbuterol  0.63 mg Nebulization BID  . metolazone  2.5 mg Oral Daily  . potassium chloride  20 mEq Oral Daily  . predniSONE  60 mg Oral Q breakfast  . simvastatin  20 mg Oral Daily  . sodium chloride  3 mL Intravenous Q12H  . traMADol  50 mg Oral BID   Continuous Infusions:  PRN Meds:.acetaminophen, chlorpheniramine-HYDROcodone, LORazepam, ondansetron, temazepam   Physical Exam: Filed Vitals:   10/19/12 0457  BP: 150/86  Pulse: 87  Temp: 98 F (36.7 C)  Resp: 20    Intake/Output Summary (Last 24 hours) at 10/19/12 0656 Last data filed at 10/19/12 0458  Gross per 24 hour  Intake    720 ml  Output   3225 ml  Net  -2505 ml  total this adm -6.3 liters Wt 118 to 115 lbs  See d/c addendum    Assessment/Plan: For d/c - d/c summary and addendums are done. FL 2 signed. Rx signed.   Illene Regulus [facility] IM (o) [phone]; (c) [phone] Call-grp - [name] IM  Tele: [phone]  10/19/2012, 6:56 AM

## 2012-10-19 NOTE — Clinical Social Work Psychosocial (Addendum)
    Clinical Social Work Department BRIEF PSYCHOSOCIAL ASSESSMENT 10/19/2012  Patient:  [name], [name]     Account Number:  [id number]     Admit date:  10/11/2012  Clinical Social Worker:  Tiburcio Pea  Date/Time:  10/13/2012 03:00 PM  Referred by:  Physician  Date Referred:  10/13/2012 Referred for  Other - See comment   Other Referral:   Return to ALF   Interview type:  Other - See comment Other interview type:   Patient, son [name]    PSYCHOSOCIAL DATA Living Status:  FACILITY Admitted from facility:   Level of care:   Primary support name:  [name]   (c)  [phone] Primary support relationship to patient:  CHILD, ADULT Degree of support available:   Strong support    Daughter - [name]    CURRENT CONCERNS Current Concerns  Post-Acute Placement   Other Concerns:   Plan return to ALF    SOCIAL WORK ASSESSMENT / PLAN CSW met with patient and spoke to son [name] via phone. Resident of [facility].  Spoke with De Blanch- Administrator of ALF- he plans for patient to return to facility. Patient wants to return there and her son agrees. FL2 to be placed on chart for MD's signature.  Patient is not medically stable for d/c at this time.   Assessment/plan status:  Psychosocial Support/Ongoing Assessment of Needs Other assessment/ plan:   Information/referral to community resources:   Patient may benefit from home health.  If ordered- will ask RNCM to arrange. Patinet/son agree. She has O2 at facility    PATIENT'S/FAMILY'S RESPONSE TO PLAN OF CARE: Patient is alert and appears oriented all spheres. She defers to her son and daughter regarding d/c decisions and states that she has been a resident at [facility] in the recent past- placed at [facility] about 1 month ago. She likes it "Ok" but wishes she woudl be home.  CSW will provide support.

## 2012-10-24 ENCOUNTER — Other Ambulatory Visit (HOSPITAL_BASED_OUTPATIENT_CLINIC_OR_DEPARTMENT_OTHER): Payer: Medicare Other | Admitting: Lab

## 2012-10-24 DIAGNOSIS — D649 Anemia, unspecified: Secondary | ICD-10-CM

## 2012-10-24 DIAGNOSIS — D509 Iron deficiency anemia, unspecified: Secondary | ICD-10-CM

## 2012-10-24 LAB — CBC WITH DIFFERENTIAL/PLATELET
Basophils Absolute: 0 10*3/uL (ref 0.0–0.1)
Eosinophils Absolute: 0 10*3/uL (ref 0.0–0.5)
HCT: 28.1 % — ABNORMAL LOW (ref 34.8–46.6)
HGB: 9.3 g/dL — ABNORMAL LOW (ref 11.6–15.9)
MCV: 85.2 fL (ref 79.5–101.0)
MONO%: 6.1 % (ref 0.0–14.0)
NEUT#: 9.4 10*3/uL — ABNORMAL HIGH (ref 1.5–6.5)
NEUT%: 87.6 % — ABNORMAL HIGH (ref 38.4–76.8)
RDW: 16.9 % — ABNORMAL HIGH (ref 11.2–14.5)
lymph#: 0.6 10*3/uL — ABNORMAL LOW (ref 0.9–3.3)

## 2012-10-24 LAB — FERRITIN: Ferritin: 134 ng/mL (ref 10–291)

## 2012-10-24 LAB — IRON AND TIBC
TIBC: 342 ug/dL (ref 250–470)
UIBC: 282 ug/dL (ref 125–400)

## 2012-10-26 ENCOUNTER — Telehealth: Payer: Self-pay | Admitting: Internal Medicine

## 2012-10-26 ENCOUNTER — Other Ambulatory Visit: Payer: Self-pay | Admitting: *Deleted

## 2012-10-26 NOTE — Telephone Encounter (Signed)
Called pt and left message for MD viisit only on 11/08/12

## 2012-10-31 ENCOUNTER — Ambulatory Visit: Payer: Medicare Other | Admitting: Internal Medicine

## 2012-11-02 ENCOUNTER — Telehealth: Payer: Self-pay | Admitting: *Deleted

## 2012-11-02 DIAGNOSIS — D509 Iron deficiency anemia, unspecified: Secondary | ICD-10-CM

## 2012-11-02 NOTE — Telephone Encounter (Signed)
Message copied by Caren Griffins on Wed Nov 02, 2012  3:19 PM ------      Message from: Si Gaul      Created: Sat Oct 29, 2012  8:49 PM      Regarding: RE: f/u appt       No need for Feraheme. F/U in 3 months with repeat CbC, Iron study and Ferritin.      ----- Message -----         From: Marlan Palau, RN         Sent: 10/28/2012   4:58 PM           To: Si Gaul, MD, Charma Igo, RN      Subject: f/u appt                                                 Pt's son called stating that pt is at a nursing facility and it is hard for her to get out.  She had her iron study done and is wanting to know if they need to have a f/u appt to discuss results?                     ------

## 2012-11-02 NOTE — Telephone Encounter (Signed)
Called and spoke to pt's son, he is aware schedulers will call with new appt and labs in 3 months.  SLJ

## 2012-11-03 ENCOUNTER — Telehealth: Payer: Self-pay | Admitting: Internal Medicine

## 2012-11-03 NOTE — Telephone Encounter (Signed)
, °

## 2012-11-06 DIAGNOSIS — Z0279 Encounter for issue of other medical certificate: Secondary | ICD-10-CM

## 2012-11-08 ENCOUNTER — Ambulatory Visit: Payer: Medicare Other | Admitting: Internal Medicine

## 2012-11-30 ENCOUNTER — Ambulatory Visit (INDEPENDENT_AMBULATORY_CARE_PROVIDER_SITE_OTHER): Payer: Medicare Other | Admitting: Internal Medicine

## 2012-11-30 ENCOUNTER — Other Ambulatory Visit (INDEPENDENT_AMBULATORY_CARE_PROVIDER_SITE_OTHER): Payer: Medicare Other

## 2012-11-30 ENCOUNTER — Encounter: Payer: Self-pay | Admitting: Internal Medicine

## 2012-11-30 VITALS — BP 130/52 | HR 72 | Temp 98.2°F | Resp 16 | Wt 123.0 lb

## 2012-11-30 DIAGNOSIS — J45909 Unspecified asthma, uncomplicated: Secondary | ICD-10-CM

## 2012-11-30 DIAGNOSIS — I2510 Atherosclerotic heart disease of native coronary artery without angina pectoris: Secondary | ICD-10-CM

## 2012-11-30 DIAGNOSIS — D649 Anemia, unspecified: Secondary | ICD-10-CM

## 2012-11-30 DIAGNOSIS — I10 Essential (primary) hypertension: Secondary | ICD-10-CM

## 2012-11-30 DIAGNOSIS — E119 Type 2 diabetes mellitus without complications: Secondary | ICD-10-CM

## 2012-11-30 DIAGNOSIS — I509 Heart failure, unspecified: Secondary | ICD-10-CM

## 2012-11-30 LAB — CBC WITH DIFFERENTIAL/PLATELET
Eosinophils Relative: 3.3 % (ref 0.0–5.0)
Monocytes Relative: 9 % (ref 3.0–12.0)
Neutrophils Relative %: 71.2 % (ref 43.0–77.0)
Platelets: 168 10*3/uL (ref 150.0–400.0)
WBC: 6.8 10*3/uL (ref 4.5–10.5)

## 2012-11-30 MED ORDER — LORATADINE 10 MG PO TABS: 10.0000 mg | ORAL_TABLET | Freq: Every day | ORAL | Status: DC

## 2012-11-30 NOTE — Assessment & Plan Note (Signed)
Use HHN prn

## 2012-11-30 NOTE — Assessment & Plan Note (Signed)
Continue with current prescription therapy as reflected on the Med list.  

## 2012-11-30 NOTE — Assessment & Plan Note (Signed)
Qualifier: Diagnosis of  By: Debby Bud MD, Rosalyn Gess  Last 2D echo May '13: Impressions:  - Normal LV size and systolic function, EF 60-65%. There was severe mitral regurgitation with a partial flail anterior leaflet. Massive biatrial enlargement. Normal RV size and systolic function. Moderate tricuspid regurgitation. Severe pulmonary hypertension.  Recent hosp stay d/c on 10/11/12 Continue with current prescription therapy as reflected on the Med list.

## 2012-11-30 NOTE — Assessment & Plan Note (Signed)
Medications: Lantus 34 units in am and 9 u qHS;  metformin 500 mg bid was stopped

## 2012-11-30 NOTE — Progress Notes (Signed)
Subjective:    Patient ID: [name], female    DOB: 1920-05-08, 79 y.o.   MRN: [id number]  HPI  The patient presents for a follow-up of  CHF, chronic hypertension, chronic dyslipidemia, type 2 diabetes, OA controlled with medicines   The pt was d/c from hosp on 10/11/12:  Issues/ summary -  1. Cardiac. The patient with persistent mild congestive heart  failure. She does have severe coronary artery disease, and severe  valve disease but is not a candidate for aggressive intervention.  Cardiology has seen her and feels that supportive medical care is  the best treatment. The patient has had a significant diuresis  during this admission. Plan will be to continue her current  medications listed at the end of this dictation.  2. Pulmonary. The patient presented with what appeared to be a viral  asthmatic bronchitis. She initially had been treated with  antibiotics but with the absence of white count elevation,  productive sputum, antibiotics were discontinued. The patient had  been given Solu-Medrol initially but currently is on oral  prednisone and would continue this until she is stable and then  taper her off slowly. Recommending 40 mg daily for 3 days, 20 mg  daily for 3 days, 10 mg daily for 6, and then discontinue if her  oxygen saturations are normal.  3. Fluids, electrolytes. The patient has been doing well. Her last  potassium was normal.  4. Diabetes. The patient had a well-controlled A1c is noted. She has  intentionally been allowed to run an elevated serum glucose to  avoid episodes of hypoglycemia which had occurred previously. She  appears to be stable on her current dose of Lantus and will  continue that. She will also continue her other medications as  listed below. She does need to follow a low carb, no sugar diet.  5. Myelodysplastic syndrome. The patient has chronic myelodysplastic  syndrome with chronic anemia. She usually runs a hemoglobin  between 7.5 and  10. Transfusion threshold of hemoglobin less than  7. We worked to try to avoid transfusions in this patient. She  did have a transient elevation in hemoglobin, but her final  hemoglobin is closer to her baseline. Would recommend that she  have a followup H and H in approximately 4 weeks or if she becomes  more symptomatic.  CODE STATUS: Did discuss this with the patient on October 18, 2012, and  also discussed it with her son who has her power of attorney. The  patient is adamant as she wants all available treatment. She does not  want to discuss end of life care or advance care planning. Her son is  aware of this although he does support DNR status given the patient's  adamant insistence on full care. At this time, she is a full code.   S/p 3 wk rehab stay at [facility]. [name] is visiting her at home  Wt Readings from Last 3 Encounters:  11/30/12 123 lb (55.792 kg)  10/19/12 115 lb 11.2 oz (52.481 kg)  08/15/12 129 lb (58.514 kg)   BP Readings from Last 3 Encounters:  11/30/12 130/52  10/19/12 140/59  08/18/12 140/84     Review of Systems  Constitutional: Negative for chills, activity change, appetite change, fatigue and unexpected weight change.  HENT: Negative for congestion, mouth sores and sinus pressure.   Eyes: Negative for visual disturbance.  Respiratory: Negative for cough and chest tightness.   Gastrointestinal: Negative for nausea and abdominal pain.  Genitourinary: Negative for frequency, difficulty urinating and vaginal pain.  Musculoskeletal: Positive for gait problem. Negative for back pain.  Skin: Negative for pallor and rash.  Neurological: Negative for dizziness, tremors, weakness, numbness and headaches.  Psychiatric/Behavioral: Positive for confusion and decreased concentration. Negative for suicidal ideas, behavioral problems, sleep disturbance and agitation.       Objective:   Physical Exam  Constitutional: She appears well-developed. No distress.   HENT:  Head: Normocephalic.  Right Ear: External ear normal.  Left Ear: External ear normal.  Nose: Nose normal.  Mouth/Throat: Oropharynx is clear and moist.  Eyes: Conjunctivae are normal. Pupils are equal, round, and reactive to light. Right eye exhibits no discharge. Left eye exhibits no discharge.  Neck: Normal range of motion. Neck supple. No JVD present. No tracheal deviation present. No thyromegaly present.  Cardiovascular: Normal rate, regular rhythm and normal heart sounds.   Pulmonary/Chest: No stridor. No respiratory distress. She has no wheezes.  Abdominal: Soft. Bowel sounds are normal. She exhibits no distension and no mass. There is no tenderness. There is no rebound and no guarding.  Musculoskeletal: She exhibits no edema and no tenderness.  In a w/c  Lymphadenopathy:    She has no cervical adenopathy.  Neurological: She is alert. She displays normal reflexes. No cranial nerve deficit. She exhibits normal muscle tone. Coordination abnormal.  Skin: No rash noted. No erythema.  Psychiatric: She has a normal mood and affect.    Lab Results  Component Value Date   WBC 10.7* 10/24/2012   HGB 9.3* 10/24/2012   HCT 28.1* 10/24/2012   PLT 183 10/24/2012   GLUCOSE 75 10/19/2012   CHOL  Value: 84        ATP III CLASSIFICATION:  <200     mg/dL   Desirable  161-096  mg/dL   Borderline High  >=045    mg/dL   High        11/16/8117   TRIG 84 09/02/2009   HDL 28* 09/02/2009   LDLCALC  Value: 39        Total Cholesterol/HDL:CHD Risk Coronary Heart Disease Risk Table                     Men   Women  1/2 Average Risk   3.4   3.3  Average Risk       5.0   4.4  2 X Average Risk   9.6   7.1  3 X Average Risk  23.4   11.0        Use the calculated Patient Ratio above and the CHD Risk Table to determine the patient's CHD Risk.        ATP III CLASSIFICATION (LDL):  <100     mg/dL   Optimal  147-829  mg/dL   Near or Above                    Optimal  130-159  mg/dL   Borderline  562-130  mg/dL   High   >865     mg/dL   Very High 7/84/6962   ALT 19 08/16/2012   AST 15 08/16/2012   NA 143 10/19/2012   K 2.8* 10/19/2012   CL 92* 10/19/2012   CREATININE 0.84 10/19/2012   BUN 48* 10/19/2012   CO2 43* 10/19/2012   TSH 3.898 03/28/2012   INR 1.03 03/28/2012   HGBA1C 7.0* 06/24/2012    A complex case Hosp notes, tests eviewed  Assessment & Plan:

## 2012-12-01 ENCOUNTER — Other Ambulatory Visit: Payer: Self-pay | Admitting: Internal Medicine

## 2012-12-02 NOTE — Telephone Encounter (Signed)
Temazepam called to pharmacy  

## 2012-12-06 ENCOUNTER — Telehealth: Payer: Self-pay

## 2012-12-06 MED ORDER — INSULIN GLARGINE 100 UNIT/ML ~~LOC~~ SOLN: 20.0000 [IU] | Freq: Every day | SUBCUTANEOUS | Status: DC

## 2012-12-06 NOTE — Telephone Encounter (Signed)
HHRN called to inform MD that pt's family is concerned about pt's morning fasting glucose - 04/26 it was 81, 04/27 it was 83, 04/28 it was 72 and this morning it was 64. Family is requesting MD advisement on decreasing pt's night time Lantus - pt is currently taking 34 units in the morning and 9 units at night, please advise

## 2012-12-06 NOTE — Telephone Encounter (Signed)
Ok to take 35 units in the AM only  No need to take bid  Cont to monitor sugars, and call with results in 5 days

## 2012-12-06 NOTE — Telephone Encounter (Signed)
HHRN informed 

## 2012-12-16 ENCOUNTER — Other Ambulatory Visit: Payer: Self-pay | Admitting: Internal Medicine

## 2012-12-27 ENCOUNTER — Other Ambulatory Visit: Payer: Self-pay | Admitting: Internal Medicine

## 2012-12-27 DIAGNOSIS — D509 Iron deficiency anemia, unspecified: Secondary | ICD-10-CM

## 2013-01-11 ENCOUNTER — Telehealth: Payer: Self-pay

## 2013-01-11 MED ORDER — ONDANSETRON HCL 4 MG PO TABS: 4.0000 mg | ORAL_TABLET | Freq: Four times a day (QID) | ORAL | Status: DC | PRN

## 2013-01-11 MED ORDER — PANTOPRAZOLE SODIUM 40 MG PO TBEC: 40.0000 mg | DELAYED_RELEASE_TABLET | Freq: Every day | ORAL | Status: DC

## 2013-01-11 NOTE — Telephone Encounter (Signed)
Phone call from son, Elzena Muston, he states his mother has been extremely nauseous in the mornings for the past week. The phenergan 12.5 mg is not helping much. The nausea happens at times throughout the day but it is severe in the mornings. Please advise.

## 2013-01-11 NOTE — Telephone Encounter (Signed)
Patients son Tammy Sours notified and rx's sent

## 2013-01-11 NOTE — Telephone Encounter (Signed)
meds reviewed: 1. Stop phenergan and switch to generic zofran 4 mg every 6 hrs as needed. 2. Start pantoprazole 40 mg every AM  If this doesn't help over the next 7-10 days will need OV

## 2013-01-16 ENCOUNTER — Telehealth: Payer: Self-pay

## 2013-01-16 DIAGNOSIS — D509 Iron deficiency anemia, unspecified: Secondary | ICD-10-CM

## 2013-01-16 NOTE — Telephone Encounter (Signed)
done

## 2013-01-16 NOTE — Telephone Encounter (Signed)
1. Ok to stop zofran and use phenergan 12.5 mg po q 6 hrs as needed. 2. CHF not usually associated with Nausea. 3. For persistent problems she may need to be seen for evaluation.

## 2013-01-16 NOTE — Telephone Encounter (Signed)
Phone call from son, Tammy Sours, he states generic Zofran is not helping much with pt's nausea. He believes the phenergan helped better. She is still very nauseous in the morning it does subside towards the afternoon. He would like to know if the nausea may be due to CHF?

## 2013-01-16 NOTE — Telephone Encounter (Signed)
Phone call to Tammy Sours to let him to stop the Zofran and pt can resume Phenergan 12.5 mg q 6 hours as needed. I let him know she may need to be seen. He requests an order be placed so she can have her hemoglobin checked. He states this has been done in the past and it has been low. Please advise.

## 2013-01-16 NOTE — Telephone Encounter (Signed)
Greg (son) notified

## 2013-01-17 ENCOUNTER — Other Ambulatory Visit (INDEPENDENT_AMBULATORY_CARE_PROVIDER_SITE_OTHER): Payer: Medicare Other

## 2013-01-17 DIAGNOSIS — D509 Iron deficiency anemia, unspecified: Secondary | ICD-10-CM

## 2013-01-24 ENCOUNTER — Ambulatory Visit (INDEPENDENT_AMBULATORY_CARE_PROVIDER_SITE_OTHER): Payer: Medicare Other | Admitting: Internal Medicine

## 2013-01-24 ENCOUNTER — Encounter: Payer: Self-pay | Admitting: Internal Medicine

## 2013-01-24 VITALS — BP 130/64 | HR 82 | Temp 97.1°F | Resp 16 | Wt 120.0 lb

## 2013-01-24 DIAGNOSIS — I340 Nonrheumatic mitral (valve) insufficiency: Secondary | ICD-10-CM

## 2013-01-24 DIAGNOSIS — I059 Rheumatic mitral valve disease, unspecified: Secondary | ICD-10-CM

## 2013-01-24 DIAGNOSIS — I509 Heart failure, unspecified: Secondary | ICD-10-CM

## 2013-01-24 DIAGNOSIS — D649 Anemia, unspecified: Secondary | ICD-10-CM

## 2013-01-24 DIAGNOSIS — I10 Essential (primary) hypertension: Secondary | ICD-10-CM

## 2013-01-24 DIAGNOSIS — R110 Nausea: Secondary | ICD-10-CM

## 2013-01-24 NOTE — Progress Notes (Signed)
Subjective:    Patient ID: [name], female    DOB: 1919/09/04, 79 y.o.   MRN: [id number]  HPI [name] is now at home. She is doing well at home. She was seen by Dr. Roena Malady after release from [facility]. Small adjustment noted in her regimen: PM lantus stopped, now on 35 units in AM. Metformin had been stopped in facility and she has been doing OK . Last A1C 7 % Nov '13.  For several weeks she has had frequent Nausea. She describes a gnawing feeling in the stomach relieved by crackers. She admits to early satiety and prolonged fullness but denies bloating. She does have a history of gastritis and ulcer in the past.  PMH, FamHx and SocHx reviewed for any changes and relevance.  Current Outpatient Prescriptions on File Prior to Visit  Medication Sig Dispense Refill  . acetaminophen (TYLENOL) 325 MG tablet Take 2 tablets (650 mg total) by mouth every 4 (four) hours as needed.      Marland Kitchen amLODipine (NORVASC) 5 MG tablet Take 1 tablet (5 mg total) by mouth daily.      Marland Kitchen aspirin EC 81 MG EC tablet Take 1 tablet (81 mg total) by mouth daily.      . carvedilol (COREG) 6.25 MG tablet Take 1 tablet (6.25 mg total) by mouth 2 (two) times daily with a meal.      . Cholecalciferol (VITAMIN D3) 1000 UNITS CAPS Take 1,000 Units by mouth daily.       . clonazePAM (KLONOPIN) 0.5 MG tablet Take 0.5 mg by mouth 2 (two) times daily as needed. For anxiety      . docusate sodium (COLACE) 100 MG capsule Take 100 mg by mouth daily.      . feeding supplement (GLUCERNA SHAKE) LIQD Take 237 mLs by mouth 3 (three) times daily between meals.      . FeFum-FePoly-FA-B Cmp-C-Biot (INTEGRA PLUS) CAPS TAKE ONE CAPSULE BY MOUTH EVERY DAY  30 each  3  . ferrous sulfate 325 (65 FE) MG tablet Take 325 mg by mouth daily with breakfast.      . furosemide (LASIX) 40 MG tablet Take 1.5 tablets (60 mg total) by mouth 2 (two) times daily.  90 tablet  6  . glucose blood test strip Test 2 (two) times daily. DX 250.00  100 each  11  .  HYDROcodone-acetaminophen (NORCO/VICODIN) 5-325 MG per tablet Take 1 tablet by mouth every 6 (six) hours as needed for pain.       Marland Kitchen insulin glargine (LANTUS) 100 UNIT/ML injection Inject 0.2 mLs (20 Units total) into the skin at bedtime. 35 u in AM only  10 mL  5  . irbesartan (AVAPRO) 300 MG tablet Take 150 mg by mouth daily.       Marland Kitchen LANOXIN 125 MCG tablet TAKE ONE TABLET BY MOUTH EVERY DAY  30 tablet  5  . loratadine (CLARITIN) 10 MG tablet Take 1 tablet (10 mg total) by mouth daily.  100 tablet  3  . metolazone (ZAROXOLYN) 2.5 MG tablet Take 2.5 mg by mouth every Monday, Wednesday, and Friday. 30 minutes before Lasix      . potassium chloride SA (K-DUR,KLOR-CON) 20 MEQ tablet Take 20 mEq by mouth 3 (three) times daily.      Marland Kitchen RELION MINI PEN NEEDLES 31G X 6 MM MISC 1 Container.      . senna-docusate (SENOKOT-S) 8.6-50 MG per tablet Take 1 tablet by mouth at bedtime.      Marland Kitchen  temazepam (RESTORIL) 15 MG capsule TAKE ONE CAPSULE BY MOUTH AT BEDTIME  30 capsule  0   No current facility-administered medications on file prior to visit.      Review of Systems System review is negative for any constitutional, cardiac, pulmonary, GI or neuro symptoms or complaints other than as described in the HPI.     Objective:   Physical Exam Filed Vitals:   01/24/13 1141  BP: 130/64  Pulse: 82  Temp: 97.1 F (36.2 C)  Resp: 16   Wt Readings from Last 3 Encounters:  01/24/13 120 lb (54.432 kg)  11/30/12 123 lb (55.792 kg)  10/19/12 115 lb 11.2 oz (52.481 kg)   Gen'l- very slender white woman, well groomed in no distress HEENT_ C&S clear Cor - RRR, III/VI murmur across chest Pulm - normal respirations Abd -  Bloated in lower quadrants, minimal tenderness       Assessment & Plan:  Nausea - per patient's report of symptom, history and exam suspect acid related dyspepsia/gastritis  Plan PPI therapy

## 2013-01-24 NOTE — Patient Instructions (Addendum)
Diabetes - it seems you have good control on the lantus in the morning as single therapy  Nausea - with the gnawing feeling and the early satiety make me think of acid related gastric irritation - not quite an ulcer. Plan Start taking Nexium 40 mg every AM.  If you do not see improvement in the nausea and "gnawing" feeling let me know and we'll consider Rx for gastroperesis.

## 2013-01-25 ENCOUNTER — Telehealth: Payer: Self-pay | Admitting: *Deleted

## 2013-01-25 NOTE — Telephone Encounter (Signed)
Pt's son called stating that she saw Dr Debby Bud and her CBC looked good and she does not need to come to f/u with [facility] on 6/25.  Per son's request, lab and f/u cancelled.  SLJ

## 2013-01-26 NOTE — Assessment & Plan Note (Signed)
BP Readings from Last 3 Encounters:  01/24/13 130/64  11/30/12 130/52  10/19/12 140/59   Good control on present medications. No change in regimen.

## 2013-01-26 NOTE — Assessment & Plan Note (Signed)
No sings of decompensation at today's exam.  Plan Continue present medical regimen

## 2013-01-26 NOTE — Assessment & Plan Note (Signed)
Lab Results  Component Value Date   HGB 11.2* 01/17/2013   Very stable hemoglobin. No signs or symptoms of anemia  Plan Continue routine observation and care per Dr. Arbutus Ped

## 2013-01-27 ENCOUNTER — Other Ambulatory Visit: Payer: Self-pay | Admitting: Internal Medicine

## 2013-01-27 NOTE — Telephone Encounter (Signed)
Okay for refill in Dr Debby Bud absence?

## 2013-01-27 NOTE — Telephone Encounter (Signed)
Hydrocodone called to pharmacy  

## 2013-01-29 ENCOUNTER — Other Ambulatory Visit: Payer: Self-pay | Admitting: Internal Medicine

## 2013-01-30 ENCOUNTER — Other Ambulatory Visit: Payer: Medicare Other | Admitting: Lab

## 2013-02-01 ENCOUNTER — Ambulatory Visit: Payer: Medicare Other | Admitting: Internal Medicine

## 2013-02-03 ENCOUNTER — Other Ambulatory Visit: Payer: Self-pay | Admitting: Internal Medicine

## 2013-02-03 NOTE — Telephone Encounter (Signed)
Temazepam called to pharmacy  

## 2013-02-03 NOTE — Telephone Encounter (Signed)
Phone call from patients son stating his mother was started on Nexium (given samples) and he states this is not helping. It was discussed at the office visit that you may prescribe something else. Also he states [facility] on [address] sent a request for refill on Temazepam. I let him know this was received and just waiting on your okay.

## 2013-02-13 ENCOUNTER — Telehealth: Payer: Self-pay | Admitting: Internal Medicine

## 2013-02-13 DIAGNOSIS — R110 Nausea: Secondary | ICD-10-CM

## 2013-02-13 NOTE — Telephone Encounter (Signed)
Patient is nauseous every day. Nexium is not helping.  What to do next?    They are giving her the medicine she has for nausea up to 3 times a day.  She is almost out.  It is not helping either.  He did not know the name of that medicine.

## 2013-02-13 NOTE — Telephone Encounter (Signed)
GI consult. [facility] notified

## 2013-02-14 ENCOUNTER — Other Ambulatory Visit: Payer: Self-pay | Admitting: Internal Medicine

## 2013-02-14 NOTE — Telephone Encounter (Signed)
Her son is aware of the referral.

## 2013-02-23 ENCOUNTER — Encounter: Payer: Self-pay | Admitting: Gastroenterology

## 2013-02-23 ENCOUNTER — Ambulatory Visit (INDEPENDENT_AMBULATORY_CARE_PROVIDER_SITE_OTHER): Payer: Medicare Other | Admitting: Gastroenterology

## 2013-02-23 ENCOUNTER — Other Ambulatory Visit: Payer: Medicare Other

## 2013-02-23 VITALS — BP 140/58 | HR 100 | Ht 61.0 in | Wt 120.0 lb

## 2013-02-23 DIAGNOSIS — K5900 Constipation, unspecified: Secondary | ICD-10-CM

## 2013-02-23 DIAGNOSIS — R110 Nausea: Secondary | ICD-10-CM

## 2013-02-23 DIAGNOSIS — Z79899 Other long term (current) drug therapy: Secondary | ICD-10-CM

## 2013-02-23 NOTE — Patient Instructions (Addendum)
Your physician has requested that you go to the basement for the following lab work before leaving today: Digoxin level.  Please take your iron supplement at lunch instead of bedtime.  Take your promethazine every time you take your Vicodin and use as needed.   Thank you for choosing me and [facility].  Venita Lick. Pleas Koch., MD., Clementeen Graham

## 2013-02-23 NOTE — Progress Notes (Signed)
History of Present Illness: This is a 79 year old female accompanied by her son. Both her and her son provide history. Patient has multiple comorbidities and is maintained on multiple medications. She has had nausea primarily bothersome in the mornings and the symptoms have been present for several months. Her nausea temporarily has improved with Alka-Seltzer but she cannot tolerate drinking Alka-Seltzer. She has mild chronic constipation that is well controlled with the use of a stool softener and Senokot. She takes hydrocodone before bed and in the middle of the night to control arthritis pain to allow her to sleep. She does not use hydrocodone at other times. Promethazine has been effective in controlling her nausea. For the past few evenings her son has given her promethazine along with her hydrocodone and her morning nausea is significantly improved. She was given a short trial of Nexium without a change in symptoms. Denies weight loss, abdominal pain, diarrhea, change in stool caliber, melena, hematochezia, vomiting, dysphagia, reflux symptoms, chest pain.  Review of Systems: Pertinent positive and negative review of systems were noted in the above HPI section. All other review of systems were otherwise negative.  Current Medications, Allergies, Past Medical History, Past Surgical History, Family History and Social History were reviewed in [employer] record.  Physical Exam: General: Well developed , elderly white female in a wheelchair, no acute distress Head: Normocephalic and atraumatic Eyes:  sclerae anicteric, EOMI Ears: Normal auditory acuity Mouth: No deformity or lesions Neck: Supple, no masses or thyromegaly Lungs: Clear throughout to auscultation Heart: Regular rate and rhythm; no murmurs, rubs or bruits Abdomen: Soft, non tender and non distended. No masses, hepatosplenomegaly or hernias noted. Normal Bowel sounds Musculoskeletal: Symmetrical with no gross  deformities  Skin: No lesions on visible extremities Pulses:  Normal pulses noted Extremities: No clubbing, cyanosis, edema or deformities noted Neurological: Alert oriented x 4, grossly nonfocal Cervical Nodes:  No significant cervical adenopathy Inguinal Nodes: No significant inguinal adenopathy Psychological:  Alert and cooperative. Normal mood and affect  Assessment and Recommendations:  1. Nausea. Primarily in the mornings. Suspected medication side effects versus secondary to her multiple comorbidities were both. Rule out GERD and gastritis. Suspect this is a hydrocodone side effect given the timing of nausea following hydrocodone doses. Recommended to continue to use promethazine with doses of hydrocodone at night. Change iron to lunchtime instead of bedtime. Obtain a digoxin level. Trial of Mylanta tablets or TUMS when necessary for nausea. Consider further evaluation if the above measures are not effective.

## 2013-02-24 ENCOUNTER — Other Ambulatory Visit: Payer: Self-pay

## 2013-02-24 ENCOUNTER — Other Ambulatory Visit: Payer: Self-pay | Admitting: Internal Medicine

## 2013-02-24 LAB — DIGOXIN LEVEL: Digoxin Level: 0.8 ng/mL (ref 0.8–2.0)

## 2013-02-24 MED ORDER — POTASSIUM CHLORIDE CRYS ER 20 MEQ PO TBCR: 20.0000 meq | EXTENDED_RELEASE_TABLET | Freq: Three times a day (TID) | ORAL | Status: DC

## 2013-03-06 ENCOUNTER — Telehealth: Payer: Self-pay | Admitting: *Deleted

## 2013-03-06 NOTE — Telephone Encounter (Signed)
Reg called states his mother is nauseated everyday.  He is requesting an evaluation of her medications to see if maybe this is causing the nausea.  Please advise.

## 2013-03-09 ENCOUNTER — Telehealth: Payer: Self-pay

## 2013-03-09 ENCOUNTER — Other Ambulatory Visit: Payer: Self-pay | Admitting: Internal Medicine

## 2013-03-09 NOTE — Telephone Encounter (Signed)
Phone call from patient's son stating he called Monday with concerns of his mothers nausea every day (not getting better). Would like to be advised what to do about this. Please advise. Thanks Note: there is a phone message from Monday the 28th.

## 2013-03-10 MED ORDER — PANTOPRAZOLE SODIUM 40 MG PO TBEC: 40.0000 mg | DELAYED_RELEASE_TABLET | Freq: Two times a day (BID) | ORAL | Status: DC

## 2013-03-10 NOTE — Telephone Encounter (Signed)
Advised of MD message.

## 2013-03-10 NOTE — Telephone Encounter (Signed)
Reviewed all meds: Iron and hydrocodone can cause nausea.   GI issues, such as gastritis, may also cause nausea - recommend starting protonix 40 mg twice a day for a couple of weeks to see if this helps. Rx sent to pharmacy

## 2013-03-21 ENCOUNTER — Telehealth: Payer: Self-pay

## 2013-03-21 MED ORDER — METOCLOPRAMIDE HCL 5 MG PO TABS: 5.0000 mg | ORAL_TABLET | Freq: Three times a day (TID) | ORAL | Status: DC

## 2013-03-21 NOTE — Telephone Encounter (Signed)
OK.  1. Reduce protonix to 40 mg in the AM before breakfast 2. Reglan 5 mg before meals. 3. If this fails will need to see GI consultant

## 2013-03-21 NOTE — Telephone Encounter (Signed)
Phone call to Tammy Sours letting him know patient needs to reduce her Protonix to 1 tablet before breakfast and a prescription for Reglan has been sent to her pharmacy. Let us know how she does. If this does not work then a GI consult will be needed.

## 2013-03-21 NOTE — Telephone Encounter (Signed)
Phone call from patients son. He wants to know of any testing or what can be done since patient has nausea daily and the medication prescribed is not helping. Please advise. Thanks.

## 2013-03-23 ENCOUNTER — Other Ambulatory Visit: Payer: Self-pay | Admitting: Internal Medicine

## 2013-03-29 ENCOUNTER — Other Ambulatory Visit: Payer: Self-pay | Admitting: Internal Medicine

## 2013-04-03 ENCOUNTER — Telehealth: Payer: Self-pay

## 2013-04-03 NOTE — Telephone Encounter (Signed)
Received approval from [facility] regarding prior authorization for Promethazine 12.5 mg good until 08/09/13. Approval has been faxed to [facility] pharmacy on [address].

## 2013-04-11 ENCOUNTER — Telehealth: Payer: Self-pay | Admitting: Internal Medicine

## 2013-04-11 NOTE — Telephone Encounter (Signed)
pts son is calling is hopes of getting his mother worked in this afternoon, she has been feeling week and nauseated for three days now.

## 2013-04-11 NOTE — Telephone Encounter (Signed)
Spoke with son, pt scheduled for tomorrow at [employer]

## 2013-04-11 NOTE — Telephone Encounter (Signed)
Might be better, less wait time, to add her on in the AM

## 2013-04-12 ENCOUNTER — Encounter: Payer: Self-pay | Admitting: Internal Medicine

## 2013-04-12 ENCOUNTER — Ambulatory Visit (INDEPENDENT_AMBULATORY_CARE_PROVIDER_SITE_OTHER): Payer: Medicare Other | Admitting: Internal Medicine

## 2013-04-12 ENCOUNTER — Other Ambulatory Visit (INDEPENDENT_AMBULATORY_CARE_PROVIDER_SITE_OTHER): Payer: Medicare Other

## 2013-04-12 VITALS — BP 122/56 | HR 104 | Temp 97.4°F | Wt 119.8 lb

## 2013-04-12 DIAGNOSIS — I509 Heart failure, unspecified: Secondary | ICD-10-CM

## 2013-04-12 DIAGNOSIS — R110 Nausea: Secondary | ICD-10-CM

## 2013-04-12 DIAGNOSIS — I10 Essential (primary) hypertension: Secondary | ICD-10-CM

## 2013-04-12 DIAGNOSIS — D649 Anemia, unspecified: Secondary | ICD-10-CM

## 2013-04-12 DIAGNOSIS — E119 Type 2 diabetes mellitus without complications: Secondary | ICD-10-CM

## 2013-04-12 LAB — COMPREHENSIVE METABOLIC PANEL
Albumin: 4 g/dL (ref 3.5–5.2)
CO2: 33 mEq/L — ABNORMAL HIGH (ref 19–32)
Calcium: 10.8 mg/dL — ABNORMAL HIGH (ref 8.4–10.5)
GFR: 52.22 mL/min — ABNORMAL LOW (ref >60.00)
Glucose, Bld: 467 mg/dL — ABNORMAL HIGH (ref 70–99)
Potassium: 3.7 mEq/L (ref 3.5–5.1)
Sodium: 131 mEq/L — ABNORMAL LOW (ref 135–145)
Total Protein: 8 g/dL (ref 6.0–8.3)

## 2013-04-12 LAB — HEMOGLOBIN AND HEMATOCRIT, BLOOD
HCT: 36.5 % (ref 36.0–46.0)
Hemoglobin: 12.3 g/dL (ref 12.0–15.0)

## 2013-04-12 MED ORDER — INSULIN GLARGINE 100 UNIT/ML ~~LOC~~ SOLN: 55.0000 [IU] | Freq: Every day | SUBCUTANEOUS | Status: DC

## 2013-04-12 NOTE — Assessment & Plan Note (Signed)
No evidence of decompensation by history or exam.  Plan Continue present medication.

## 2013-04-12 NOTE — Assessment & Plan Note (Signed)
BP Readings from Last 3 Encounters:  04/12/13 122/56  02/23/13 140/58  01/24/13 130/64   Good control.

## 2013-04-12 NOTE — Assessment & Plan Note (Signed)
On lantus alone for DM control. Stopped metform due to N/V and age (poor renal function)  Plan  change lantus to 55 units once a day  A1C with recommendations to follow.

## 2013-04-12 NOTE — Progress Notes (Signed)
Subjective:    Patient ID: [name], female    DOB: 03/21/1920, 79 y.o.   MRN: [id number]  HPI [name] presents for persistent nausea and weakness. Actually [name] is doing better with nausea taking promethazine at bedtime along with her hydrocodone. [name] did see Dr. Russella Dar in July and was thought to be stable.  Her son reports that [name] is getting up in the middle of the night and does snack. Her blood sugars have running very high - 300-400. [name] is currently split dosing Lantus at 10 units qHS and 35 units qAM as mono-therapy. The goal is reasonable, not tight control.  [name] has been weak and there is a concern about recurrent anemia.  Past Medical History  Diagnosis Date  . Inguinal hernia   . Severe mitral regurgitation     a. 12/2011 Echo: Sev MR with flail leaflet.  . Other and unspecified hyperlipidemia   . Pneumonia   . Osteoarthrosis, unspecified whether generalized or localized, unspecified site   . Unspecified essential hypertension   . Type II or unspecified type diabetes mellitus without mention of complication, not stated as uncontrolled   . Coronary atherosclerosis of unspecified type of vessel, native or graft     a. 11/2007 Cath: 3vd, sev MR ->felt to be poor surgical candidate->Med Rx  . Chronic diastolic CHF (congestive heart failure)     a. 12/2011 Echo: EF 60-65%, No rwma, Mild AI, Sev MR w/ flail segment of ant mv leflet, massive bi-atrial enlargement, Mod TR, PASP 86.  . Atrial fibrillation     a. chronic - no longer on anticoagulation.  Marland Kitchen Unspecified asthma(493.90)   . Anemia, unspecified   . Allergic rhinitis, cause unspecified   . Adenomatous colon polyp 10/2009  . GIB (gastrointestinal bleeding)     2009  . Nephrolithiasis    Past Surgical History  Procedure Laterality Date  . Breast cyst aspiration      x50  . Abdominal hysterectomy  1990  . Lumbar fusion  2007    [facility]  . Total hip arthroplasty  01/2007    Right - Dr Valentina Gu  . Inguinal hernia  repair  2010    [facility]   Family History  Problem Relation Age of Onset  . Heart disease Father     CHF  . Lymphoma Sister   . Colon cancer Neg Hx    History   Social History  . Marital Status: Widowed    Spouse Name: N/A    Number of Children: N/A  . Years of Education: N/A   Occupational History  . Not on file.   Social History Main Topics  . Smoking status: Never Smoker   . Smokeless tobacco: Never Used  . Alcohol Use: No  . Drug Use: No  . Sexual Activity: No   Other Topics Concern  . Not on file   Social History Narrative   Married '42 - 38 years - Widowed   1 son - '50, 1 dtr - '46   4 grandchildren   Regular Exercise -  NO      Lives in [place].      End of life issues: no prolonged mechanical vent. Suport. Laymen's guide to death with dignity provided with a suggestion that [name] discuss these issues with her family. Her son was supportive of having this discussion.    Current Outpatient Prescriptions on File Prior to Visit  Medication Sig Dispense Refill  . acetaminophen (TYLENOL) 325 MG tablet Take  2 tablets (650 mg total) by mouth every 4 (four) hours as needed.      Marland Kitchen amLODipine (NORVASC) 5 MG tablet Take 1 tablet (5 mg total) by mouth daily.      . Calcium Carbonate Antacid (ALKA-SELTZER ANTACID PO) Take by mouth as needed.      . carvedilol (COREG) 6.25 MG tablet Take 1 tablet (6.25 mg total) by mouth 2 (two) times daily with a meal.      . Cholecalciferol (VITAMIN D3) 1000 UNITS CAPS Take 1,000 Units by mouth daily.       . clonazePAM (KLONOPIN) 0.5 MG tablet Take 0.5 mg by mouth 2 (two) times daily as needed. For anxiety      . docusate sodium (COLACE) 100 MG capsule Take 100 mg by mouth daily.      . feeding supplement (GLUCERNA SHAKE) LIQD Take 237 mLs by mouth 3 (three) times daily between meals.      . FeFum-FePoly-FA-B Cmp-C-Biot (INTEGRA PLUS) CAPS TAKE ONE CAPSULE BY MOUTH EVERY DAY  30 each  3  . ferrous sulfate 325 (65 FE) MG tablet Take 325  mg by mouth daily with breakfast.      . furosemide (LASIX) 40 MG tablet Take 1.5 tablets (60 mg total) by mouth 2 (two) times daily.  90 tablet  6  . glucose blood test strip Test 2 (two) times daily. DX 250.00  100 each  11  . HYDROcodone-acetaminophen (NORCO/VICODIN) 5-325 MG per tablet TAKE ONE TABLET BY MOUTH EVERY 6 HOURS AS NEEDED  120 tablet  0  . irbesartan (AVAPRO) 300 MG tablet Take 150 mg by mouth daily.       Marland Kitchen LANOXIN 125 MCG tablet TAKE ONE TABLET BY MOUTH EVERY DAY  30 tablet  5  . loratadine (CLARITIN) 10 MG tablet Take 1 tablet (10 mg total) by mouth daily.  100 tablet  3  . metoCLOPramide (REGLAN) 5 MG tablet Take 1 tablet (5 mg total) by mouth 3 (three) times daily before meals.  90 tablet  1  . metolazone (ZAROXOLYN) 2.5 MG tablet TAKE 1 TABLET BY MOUTH IN THE MORNING ON MONDAY,WEDNESDAY,AND FRIDAY 30 MINUTES BEFORE TAKING FUROSEMIDE  30 tablet  0  . pantoprazole (PROTONIX) 40 MG tablet Take 40 mg by mouth daily before breakfast.      . potassium chloride SA (K-DUR,KLOR-CON) 20 MEQ tablet Take 1 tablet (20 mEq total) by mouth 3 (three) times daily.  90 tablet  3  . promethazine (PHENERGAN) 12.5 MG tablet TAKE ONE TABLET BY MOUTH EVERY 6 HOURS AS NEEDED FOR NAUSEA  30 tablet  0  . promethazine (PHENERGAN) 12.5 MG tablet TAKE ONE TABLET BY MOUTH EVERY 6 HOURS AS NEEDED FOR NAUSEA  30 tablet  0  . RELION MINI PEN NEEDLES 31G X 6 MM MISC 1 Container.      . senna-docusate (SENOKOT-S) 8.6-50 MG per tablet Take 1 tablet by mouth at bedtime.      . temazepam (RESTORIL) 15 MG capsule TAKE ONE CAPSULE BY MOUTH AT BEDTIME  30 capsule  5   No current facility-administered medications on file prior to visit.      Review of Systems System review is negative for any constitutional, cardiac, pulmonary, GI or neuro symptoms or complaints other than as described in the HPI.     Objective:   Physical Exam Filed Vitals:   04/12/13 1156  BP: 122/56  Pulse: 104  Temp: 97.4 F (36.3  C)   Wt  Readings from Last 3 Encounters:  04/12/13 119 lb 12.8 oz (54.341 kg)  02/23/13 120 lb (54.432 kg)  01/24/13 120 lb (54.432 kg)   Gen'l- very elderly woman who is well groomed and wigged in no acute distress HEENT- C&S clear Cor 2+ radial pulse that is regular vs very rate controlled, II/VI systolic murmur RSB Pulm - clear to A&P abd - soft, BS+ Neuro - awake, alert.      Assessment & Plan:

## 2013-04-12 NOTE — Assessment & Plan Note (Signed)
Chronic recurrent issue of iron deficiency - on iron replacement. She is weaker.  Plan  H/H with recommendations to follow

## 2013-04-12 NOTE — Assessment & Plan Note (Signed)
Persistent intermittent nausea. May be med induced, e.g. Iron.  Currently taking reglan 5 mg AC which has helped. Also promethazine.  Plan  continue present regimen.

## 2013-04-13 ENCOUNTER — Telehealth: Payer: Self-pay | Admitting: *Deleted

## 2013-04-13 MED ORDER — SITAGLIPTIN PHOSPHATE 50 MG PO TABS: 50.0000 mg | ORAL_TABLET | Freq: Every day | ORAL | Status: DC

## 2013-04-13 NOTE — Telephone Encounter (Signed)
1. Lab - Hgb was excellent and stable - no drop noted 2. Diabetes - A1C 11.7% - she has been running high. Serum glucsoe >400. Plan continue lantus 55 units, trial of Januvia 50 mg once a day (Rx sent to pharmacy) 3. For admittance to [facility] he will need to go there to talk with admission coordinator.

## 2013-04-13 NOTE — Telephone Encounter (Signed)
Spoke with Tammy Sours, advised of MDs message

## 2013-04-13 NOTE — Telephone Encounter (Signed)
[name] pts son called requesting referral for pt to be placed into [facility] instead of [facility] due to pts condition may no longer be appropriate for [facility].  Please advise

## 2013-04-18 ENCOUNTER — Other Ambulatory Visit: Payer: Self-pay | Admitting: Internal Medicine

## 2013-04-26 ENCOUNTER — Telehealth: Payer: Self-pay

## 2013-04-26 ENCOUNTER — Telehealth: Payer: Self-pay | Admitting: *Deleted

## 2013-04-26 NOTE — Telephone Encounter (Signed)
Jasmine December, pts daughter called requesting status of corrected/completed FL2 form.  Please advise

## 2013-04-26 NOTE — Telephone Encounter (Signed)
Form has been completed and is in Dr. Debby Bud envelope awaiting signature  Will notify when it has been signed

## 2013-04-26 NOTE — Telephone Encounter (Signed)
Phone call from Benjaman Kindler at [facility] [phone] states still waiting on FL2 form to be signed. Form is in you envelope

## 2013-04-27 ENCOUNTER — Telehealth: Payer: Self-pay

## 2013-04-27 NOTE — Telephone Encounter (Signed)
Phone call from Trudee Kuster [phone] at [facility] states they do not have orders for CBG check and insulin dose patient is to be on. Please advise. Thanks

## 2013-04-27 NOTE — Telephone Encounter (Signed)
Form completed and picked up by patients daughter

## 2013-04-28 ENCOUNTER — Telehealth: Payer: Self-pay

## 2013-04-28 NOTE — Telephone Encounter (Signed)
Signed FL2 forms today. thanks

## 2013-04-28 NOTE — Telephone Encounter (Signed)
FL2  Signed today - lantus dose is specified as 35 units qHS.

## 2013-04-28 NOTE — Telephone Encounter (Signed)
Phone call from patient's son [name] [phone] stating the [facility] form does not reflect the present medications patient is taking so [facility] is unable to administer her meds.

## 2013-05-01 ENCOUNTER — Telehealth: Payer: Self-pay | Admitting: *Deleted

## 2013-05-01 NOTE — Telephone Encounter (Signed)
[facility] Triage Call Report Triage Record Num: [id number] Operator: Judeen Hammans Patient Name: [name] Call Date & Time: 04/30/2013 10:50:20PM Patient Phone: [phone] PCP: Illene Regulus Patient Gender: Female PCP Fax : [phone] Patient DOB: 12-Feb-1920 Practice Name: [facility] Reason for Call: Caller: Deirdre Pippins; PCP: Illene Regulus (Adults only); CB#: [phone]; Call regarding Blood sugar high; CBG 517 at 8:30pm. Was given Lantus 20 units as scheduled. All emergent sxs ruled out per Diabetes: Control Problems Guidelinee except for "New or increasing symptoms or glucose out of control as defined by provider or action plan and taking medications as prescribed." Disposition to see provider within 4 hours overridden per Facility Standing Orders for Accucheck/High Blood Glucose Results for CBG > 401: Order given for Humalog 12 Units SQ x 1 now and recheck blood sugar in one hour and again in 4 hours. If blood sugar remains high after 4 hour check, call back for further orders. Order Faxed to [facility] at [phone]. Med Tech expresses understanding of orders and notes that medication will have to be delivered by after hours pharmacy - they do not have sliding scale insulin on hand. Advised to proceed with orders as soon as medication is available. Protocol(s) Used: Diabetes: Control Problems Recommended Outcome per Protocol: See Provider within 4 hours Reason for Outcome: New or increasing symptoms or glucose out of control as defined by provider or action plan AND taking medications/following therapy as prescribed Care Advice: ~ 04/30/2013 11:06:32PM Page 1 of 1 [id number]

## 2013-05-01 NOTE — Telephone Encounter (Signed)
Vernona Rieger from [facility] called requesting the following Home health services for pt:  PT, OT, ST, skilled nursing to evaluate and treat.  Requesting assessments start tomorrow if possible.  Please advise

## 2013-05-01 NOTE — Telephone Encounter (Signed)
Patient seen Sept 3rd and at that time did not seem to have a need for PT/OT/speech therapy or RN visits.  What has changed????  Will need office visit for face to face encounter to determine any home health needs.

## 2013-05-02 ENCOUNTER — Telehealth: Payer: Self-pay

## 2013-05-02 NOTE — Telephone Encounter (Signed)
Spoke with Vernona Rieger, she states pt is a new admission to [facility], further states that is routine for new admissions.  She states they will monitor pt for any changes that warrant services.

## 2013-05-02 NOTE — Telephone Encounter (Signed)
Phone call from Vernona Rieger with [facility] [phone] states family is requesting PT/OT/and speech therapy. Please advise. Thanks.

## 2013-05-02 NOTE — Telephone Encounter (Signed)
Laura has been notified

## 2013-05-02 NOTE — Telephone Encounter (Signed)
See previous note in this regard: there has to be a reason for [facility] service there also needs to be a face to face encounter specifically addressing home health needs.

## 2013-05-04 ENCOUNTER — Other Ambulatory Visit: Payer: Self-pay | Admitting: Internal Medicine

## 2013-05-08 ENCOUNTER — Encounter: Payer: Self-pay | Admitting: Internal Medicine

## 2013-05-08 ENCOUNTER — Ambulatory Visit (INDEPENDENT_AMBULATORY_CARE_PROVIDER_SITE_OTHER): Payer: Medicare Other | Admitting: Internal Medicine

## 2013-05-08 ENCOUNTER — Telehealth: Payer: Self-pay | Admitting: *Deleted

## 2013-05-08 VITALS — BP 138/50 | HR 60 | Temp 97.2°F | Wt 127.8 lb

## 2013-05-08 DIAGNOSIS — I509 Heart failure, unspecified: Secondary | ICD-10-CM

## 2013-05-08 DIAGNOSIS — I4891 Unspecified atrial fibrillation: Secondary | ICD-10-CM

## 2013-05-08 DIAGNOSIS — R110 Nausea: Secondary | ICD-10-CM

## 2013-05-08 DIAGNOSIS — D649 Anemia, unspecified: Secondary | ICD-10-CM

## 2013-05-08 DIAGNOSIS — E119 Type 2 diabetes mellitus without complications: Secondary | ICD-10-CM

## 2013-05-08 MED ORDER — METFORMIN HCL 500 MG PO TABS: 500.0000 mg | ORAL_TABLET | Freq: Two times a day (BID) | ORAL | Status: DC

## 2013-05-08 MED ORDER — POTASSIUM CHLORIDE 20 MEQ/15ML (10%) PO LIQD: 20.0000 meq | Freq: Three times a day (TID) | ORAL | Status: DC

## 2013-05-08 NOTE — Assessment & Plan Note (Signed)
Lab Results  Component Value Date   HGBA1C 11.5* 04/12/2013   We are being permissive in her control  Plan Lantus 55 units qHS  Metformin 500 mg twice a day  CBG in AM fasting.  CBG at other times as needed if symptomatic.

## 2013-05-08 NOTE — Assessment & Plan Note (Signed)
Heart sounds regular: sinus rhythm vs excellent rate control.  PLan Continue present medications.

## 2013-05-08 NOTE — Assessment & Plan Note (Signed)
Stable w/o signs of decompensation  Plan  Continue present medications

## 2013-05-08 NOTE — Assessment & Plan Note (Signed)
Lab Results  Component Value Date   HGB 12.3 04/12/2013   Stable without signs of active bleeding

## 2013-05-08 NOTE — Patient Instructions (Addendum)
See full dictation

## 2013-05-08 NOTE — Assessment & Plan Note (Signed)
Doing well on Reglan. No cogwheeling, no rigidity no signs of dyskinesia.  Plan - continue present medications.

## 2013-05-08 NOTE — Telephone Encounter (Signed)
Full copy of today's office note was sent. If after reading the office note, including current med list, she should call back.   It is ok to finish the supply of januvia before changing to metfromin - we don't like to throw away expensive medication.

## 2013-05-08 NOTE — Progress Notes (Signed)
Subjective:    Patient ID: [name], female    DOB: [birth year], 79 y.o.   MRN: [id number]  HPI [name] presents for a face to face encounter in regard to her needs for [facility] therapy. Her son is with her and helps as a historian. [name] has been doing well. She does not need any additional rehab in regard to dressing, bathing or other OT functions. She has had a problem with large potassium pills but no other swallowing issues. She does have a greater distance to walk in her new facility and this along with hip problems warrants a PT evaluation for possible strengthening or training in the use of a walking aide, i.e. Rolling walker.   Her blood sugars have been doing OK but she has had some excursion. Reviewed her med list. At her last SNF stay metformin was stopped. Her labs have been reviewed: last Creatinine was 1.1 with a reasonable GFR. There is no reason to not resume metformin  Past Medical History  Diagnosis Date  . Inguinal hernia   . Severe mitral regurgitation     a. 12/2011 Echo: Sev MR with flail leaflet.  . Other and unspecified hyperlipidemia   . Pneumonia   . Osteoarthrosis, unspecified whether generalized or localized, unspecified site   . Unspecified essential hypertension   . Type II or unspecified type diabetes mellitus without mention of complication, not stated as uncontrolled   . Coronary atherosclerosis of unspecified type of vessel, native or graft     a. 11/2007 Cath: 3vd, sev MR ->felt to be poor surgical candidate->Med Rx  . Chronic diastolic CHF (congestive heart failure)     a. 12/2011 Echo: EF 60-65%, No rwma, Mild AI, Sev MR w/ flail segment of ant mv leflet, massive bi-atrial enlargement, Mod TR, PASP 86.  . Atrial fibrillation     a. chronic - no longer on anticoagulation.  Marland Kitchen Unspecified asthma(493.90)   . Anemia, unspecified   . Allergic rhinitis, cause unspecified   . Adenomatous colon polyp 10/2009  . GIB (gastrointestinal bleeding)     2009   . Nephrolithiasis    Past Surgical History  Procedure Laterality Date  . Breast cyst aspiration      x50  . Abdominal hysterectomy  1990  . Lumbar fusion  2007    [facility]  . Total hip arthroplasty  01/2007    Right - Dr Valentina Gu  . Inguinal hernia repair  2010    [facility]   Family History  Problem Relation Age of Onset  . Heart disease Father     CHF  . Lymphoma Sister   . Colon cancer Neg Hx    History   Social History  . Marital Status: Widowed    Spouse Name: N/A    Number of Children: N/A  . Years of Education: N/A   Occupational History  . Not on file.   Social History Main Topics  . Smoking status: Never Smoker   . Smokeless tobacco: Never Used  . Alcohol Use: No  . Drug Use: No  . Sexual Activity: No   Other Topics Concern  . Not on file   Social History Narrative   Married '42 - 38 years - Widowed   1 son - '50, 1 dtr - '46   4 grandchildren   Regular Exercise -  NO      Lives in [place].      End of life issues: no prolonged mechanical vent. Suport. Laymen's  guide to death with dignity provided with a suggestion that she discuss these issues with her family. Her son was supportive of having this discussion.    Current Outpatient Prescriptions on File Prior to Visit  Medication Sig Dispense Refill  . acetaminophen (TYLENOL) 325 MG tablet Take 2 tablets (650 mg total) by mouth every 4 (four) hours as needed.      Marland Kitchen amLODipine (NORVASC) 5 MG tablet Take 1 tablet (5 mg total) by mouth daily.      . Calcium Carbonate Antacid (ALKA-SELTZER ANTACID PO) Take by mouth as needed.      . carvedilol (COREG) 6.25 MG tablet Take 1 tablet (6.25 mg total) by mouth 2 (two) times daily with a meal.      . Cholecalciferol (VITAMIN D3) 1000 UNITS CAPS Take 1,000 Units by mouth daily.       . clonazePAM (KLONOPIN) 0.5 MG tablet Take 0.5 mg by mouth 2 (two) times daily as needed. For anxiety      . docusate sodium (COLACE) 100 MG capsule Take 100 mg by mouth daily.      .  feeding supplement (GLUCERNA SHAKE) LIQD Take 237 mLs by mouth 3 (three) times daily between meals.      . FeFum-FePoly-FA-B Cmp-C-Biot (INTEGRA PLUS) CAPS TAKE ONE CAPSULE BY MOUTH EVERY DAY  30 each  3  . ferrous sulfate 325 (65 FE) MG tablet Take 325 mg by mouth daily with breakfast.      . furosemide (LASIX) 40 MG tablet Take 1.5 tablets (60 mg total) by mouth 2 (two) times daily.  90 tablet  6  . HYDROcodone-acetaminophen (NORCO/VICODIN) 5-325 MG per tablet TAKE ONE TABLET BY MOUTH EVERY 6 HOURS AS NEEDED  120 tablet  0  . insulin glargine (LANTUS) 100 UNIT/ML injection Inject 0.55 mLs (55 Units total) into the skin at bedtime.  10 mL  5  . irbesartan (AVAPRO) 300 MG tablet Take 150 mg by mouth daily.       Marland Kitchen LANOXIN 125 MCG tablet TAKE ONE TABLET BY MOUTH EVERY DAY  30 tablet  5  . loratadine (CLARITIN) 10 MG tablet Take 1 tablet (10 mg total) by mouth daily.  100 tablet  3  . metoCLOPramide (REGLAN) 5 MG tablet Take 1 tablet (5 mg total) by mouth 3 (three) times daily before meals.  90 tablet  1  . metolazone (ZAROXOLYN) 2.5 MG tablet TAKE 1 TABLET BY MOUTH IN THE MORNING ON MONDAY,WEDNESDAY,AND FRIDAY 30 MINUTES BEFORE TAKING FUROSEMIDE  30 tablet  0  . ONE TOUCH ULTRA TEST test strip CHECK BLOOD GLUCOSE TWICE A DAY  100 each  3  . pantoprazole (PROTONIX) 40 MG tablet Take 40 mg by mouth daily before breakfast.      . potassium chloride SA (K-DUR,KLOR-CON) 20 MEQ tablet Take 1 tablet (20 mEq total) by mouth 3 (three) times daily.  90 tablet  3  . promethazine (PHENERGAN) 12.5 MG tablet TAKE ONE TABLET BY MOUTH EVERY 6 HOURS AS NEEDED FOR NAUSEA  30 tablet  0  . RELION MINI PEN NEEDLES 31G X 6 MM MISC 1 Container.      . senna-docusate (SENOKOT-S) 8.6-50 MG per tablet Take 1 tablet by mouth at bedtime.      . temazepam (RESTORIL) 15 MG capsule TAKE ONE CAPSULE BY MOUTH AT BEDTIME  30 capsule  5   No current facility-administered medications on file prior to visit.      Review of  Systems System review  is negative for any constitutional, cardiac, pulmonary, GI or neuro symptoms or complaints other than as described in the HPI. Her condition was discussed with her son.     Objective:   Physical Exam Filed Vitals:   05/08/13 1415  BP: 138/50  Pulse: 60  Temp: 97.2 F (36.2 C)   Wt Readings from Last 3 Encounters:  05/08/13 127 lb 12.8 oz (57.97 kg)  04/12/13 119 lb 12.8 oz (54.341 kg)  02/23/13 120 lb (54.432 kg)   BP Readings from Last 3 Encounters:  05/08/13 138/50  04/12/13 122/56  02/23/13 140/58   Gen'l - Pleasant well groomed woman in no distress HEENT- C&W clear Cor - 2+ radial pulse, RRR, IUI/VI systolic mm heard best at the LSB Abd - Soft , not tender MSK - no deformity Neuro - Awake, alert, speech is clear. Did not ambulate - in w/c       Assessment & Plan:  Home Health needs - after a complete review of problems, a limited exam and in talking with her primary care giver the following is recommended.  NO speech or occupational therapy Physical therapy evaluation and treatment for mobility and strengthening.

## 2013-05-08 NOTE — Telephone Encounter (Signed)
Ebony caregiver, called requesting clarification on medications.  States she was told by pts son that pt is to start Metformin after Januvia is complete.  Need order to reflect same.  Also requests order for the insulin dosage.  Please advise

## 2013-05-09 NOTE — Telephone Encounter (Signed)
Left message for [facility]

## 2013-05-10 ENCOUNTER — Telehealth: Payer: Self-pay

## 2013-05-10 NOTE — Telephone Encounter (Signed)
Patient just seen in the office -a copy of office note and medications was sent back to facility with her. She is to be taking lantus 55 units qHS

## 2013-05-10 NOTE — Telephone Encounter (Signed)
Phone call from Loreta Ave, health and wellness director with [facility] [phone]) needing clarification on patient's insulin dose. Please advise.

## 2013-05-11 ENCOUNTER — Telehealth: Payer: Self-pay | Admitting: *Deleted

## 2013-05-11 ENCOUNTER — Telehealth: Payer: Self-pay

## 2013-05-11 NOTE — Telephone Encounter (Signed)
ok 

## 2013-05-11 NOTE — Telephone Encounter (Signed)
David,physical therapist, called requesting PT two times this week; three times next week; two times for 2 additional weeks.  Please advise

## 2013-05-11 NOTE — Telephone Encounter (Signed)
I spoke to [place] at [facility] and she states a written order needs to be faxed for patient to discontinue Lantus 35 units since she is on 55 units QHS. This order should be faxed to [phone].

## 2013-05-11 NOTE — Telephone Encounter (Signed)
Phone call from Denman George physical therapist at [facility] [phone]. Evaluated patient yesterday and would like to see her twice this week. Then 3 times a week for 1 week and 2 times a week for 2 weeks. Please advise.

## 2013-05-11 NOTE — Telephone Encounter (Signed)
Received fax from [facility].  All orders complete and signed by MD.  Faxed back to facility [phone].

## 2013-05-18 ENCOUNTER — Telehealth: Payer: Self-pay | Admitting: *Deleted

## 2013-05-18 NOTE — Telephone Encounter (Signed)
Tammy Sours called requesting a different sleep aide for pt.  States [facility] suggests the change.  Rx to be faxed to [phone].  Please advise

## 2013-05-19 MED ORDER — TRAZODONE HCL 50 MG PO TABS: 25.0000 mg | ORAL_TABLET | Freq: Every day | ORAL | Status: DC

## 2013-05-19 NOTE — Telephone Encounter (Signed)
Son states she has used Ambien in the past.

## 2013-05-19 NOTE — Telephone Encounter (Signed)
Do they have a preference or will it be Doctor's choice? Added trazodone 25 mg qhs to med list - will need a paper script to be faxed to facility.

## 2013-05-19 NOTE — Telephone Encounter (Signed)
Rx faxed to [facility]

## 2013-05-22 ENCOUNTER — Telehealth: Payer: Self-pay | Admitting: *Deleted

## 2013-05-22 NOTE — Telephone Encounter (Signed)
Tammy Sours called states [facility] will not administer the Promethazine because there is a contraindication between Promethazine and Reglan.  Please advise

## 2013-05-22 NOTE — Telephone Encounter (Signed)
Can use ondansetron 4 mg every 8 hrs as needed, #30.

## 2013-05-23 MED ORDER — ONDANSETRON HCL 4 MG PO TABS: 4.0000 mg | ORAL_TABLET | Freq: Three times a day (TID) | ORAL | Status: DC | PRN

## 2013-05-23 NOTE — Telephone Encounter (Signed)
Faxed Rx to [phone] as requested by Tammy Sours

## 2013-06-02 ENCOUNTER — Telehealth: Payer: Self-pay | Admitting: *Deleted

## 2013-06-02 NOTE — Telephone Encounter (Signed)
Caller left vm stating pt has been having nausea qd for several months now. He states that the Zofran has not been helping. He wants to know what is next step for patient. He wants to know what could be causing this. Please advise. Caller aware MD is out of office until 06/05/13. Please advise.

## 2013-06-05 ENCOUNTER — Telehealth: Payer: Self-pay

## 2013-06-05 NOTE — Telephone Encounter (Signed)
Received a fax from [facility] that a hard script for Temazepam 15 mg 1 cap by mouth at bedtime needs to be faxed  To [facility] at [phone].  I do not see this on patient's med list. Is this okay?

## 2013-06-05 NOTE — Telephone Encounter (Signed)
Next step will be GI consult. Will set up if they want to pursue this.

## 2013-06-06 MED ORDER — TEMAZEPAM 15 MG PO CAPS: 15.0000 mg | ORAL_CAPSULE | Freq: Every evening | ORAL | Status: DC | PRN

## 2013-06-06 NOTE — Telephone Encounter (Signed)
Ok for printed Rx for temazepam 15 mg to mailed.

## 2013-06-06 NOTE — Telephone Encounter (Signed)
Script printed, signed, and faxed to [facility] [phone]

## 2013-06-06 NOTE — Telephone Encounter (Signed)
Pt's son Tammy Sours informed.  She has been seen by Dr. Russella Dar already. I advised him to f/u with him. Tammy Sours understands.

## 2013-06-15 ENCOUNTER — Other Ambulatory Visit: Payer: Self-pay

## 2013-06-29 ENCOUNTER — Encounter (HOSPITAL_COMMUNITY): Payer: Self-pay | Admitting: Emergency Medicine

## 2013-06-29 ENCOUNTER — Emergency Department (HOSPITAL_COMMUNITY): Payer: Medicare Other

## 2013-06-29 ENCOUNTER — Emergency Department (HOSPITAL_COMMUNITY)
Admission: EM | Admit: 2013-06-29 | Discharge: 2013-06-29 | Disposition: A | Discharge status: Home / Self Care | Payer: Medicare Other | Source: Home / Self Care | Attending: Emergency Medicine | Admitting: Emergency Medicine

## 2013-06-29 DIAGNOSIS — I4891 Unspecified atrial fibrillation: Secondary | ICD-10-CM | POA: Insufficient documentation

## 2013-06-29 DIAGNOSIS — I5032 Chronic diastolic (congestive) heart failure: Secondary | ICD-10-CM | POA: Insufficient documentation

## 2013-06-29 DIAGNOSIS — R110 Nausea: Secondary | ICD-10-CM

## 2013-06-29 DIAGNOSIS — E876 Hypokalemia: Secondary | ICD-10-CM | POA: Insufficient documentation

## 2013-06-29 DIAGNOSIS — Z87442 Personal history of urinary calculi: Secondary | ICD-10-CM | POA: Insufficient documentation

## 2013-06-29 DIAGNOSIS — Z860100 Personal history of colon polyps, unspecified: Secondary | ICD-10-CM | POA: Insufficient documentation

## 2013-06-29 DIAGNOSIS — J45909 Unspecified asthma, uncomplicated: Secondary | ICD-10-CM | POA: Insufficient documentation

## 2013-06-29 DIAGNOSIS — M1990 Unspecified osteoarthritis, unspecified site: Secondary | ICD-10-CM | POA: Insufficient documentation

## 2013-06-29 DIAGNOSIS — Z794 Long term (current) use of insulin: Secondary | ICD-10-CM | POA: Insufficient documentation

## 2013-06-29 DIAGNOSIS — I2510 Atherosclerotic heart disease of native coronary artery without angina pectoris: Secondary | ICD-10-CM | POA: Insufficient documentation

## 2013-06-29 DIAGNOSIS — E119 Type 2 diabetes mellitus without complications: Secondary | ICD-10-CM | POA: Insufficient documentation

## 2013-06-29 DIAGNOSIS — I10 Essential (primary) hypertension: Secondary | ICD-10-CM | POA: Insufficient documentation

## 2013-06-29 DIAGNOSIS — K8689 Other specified diseases of pancreas: Secondary | ICD-10-CM | POA: Insufficient documentation

## 2013-06-29 DIAGNOSIS — Z79899 Other long term (current) drug therapy: Secondary | ICD-10-CM | POA: Insufficient documentation

## 2013-06-29 DIAGNOSIS — D649 Anemia, unspecified: Secondary | ICD-10-CM | POA: Insufficient documentation

## 2013-06-29 DIAGNOSIS — Z8701 Personal history of pneumonia (recurrent): Secondary | ICD-10-CM | POA: Insufficient documentation

## 2013-06-29 LAB — COMPREHENSIVE METABOLIC PANEL
AST: 26 U/L (ref 0–37)
Albumin: 3.7 g/dL (ref 3.5–5.2)
Alkaline Phosphatase: 136 U/L — ABNORMAL HIGH (ref 39–117)
BUN: 46 mg/dL — ABNORMAL HIGH (ref 6–23)
Calcium: 10.2 mg/dL (ref 8.4–10.5)
Chloride: 94 mEq/L — ABNORMAL LOW (ref 96–112)
Creatinine, Ser: 1.22 mg/dL — ABNORMAL HIGH (ref 0.50–1.10)
GFR calc Af Amer: 44 mL/min — ABNORMAL LOW (ref >90)
Glucose, Bld: 99 mg/dL (ref 70–99)
Potassium: 3 mEq/L — ABNORMAL LOW (ref 3.5–5.1)
Total Bilirubin: 1.4 mg/dL — ABNORMAL HIGH (ref 0.3–1.2)
Total Protein: 7.9 g/dL (ref 6.0–8.3)

## 2013-06-29 LAB — LIPASE, BLOOD: Lipase: 37 U/L (ref 11–59)

## 2013-06-29 LAB — CBC WITH DIFFERENTIAL/PLATELET
Eosinophils Absolute: 0.1 10*3/uL (ref 0.0–0.7)
HCT: 33.3 % — ABNORMAL LOW (ref 36.0–46.0)
Hemoglobin: 10.5 g/dL — ABNORMAL LOW (ref 12.0–15.0)
Lymphs Abs: 0.6 10*3/uL — ABNORMAL LOW (ref 0.7–4.0)
MCH: 28.8 pg (ref 26.0–34.0)
MCHC: 31.5 g/dL (ref 30.0–36.0)
Monocytes Absolute: 0.6 10*3/uL (ref 0.1–1.0)
Monocytes Relative: 11 % (ref 3–12)
Neutrophils Relative %: 77 % (ref 43–77)
RBC: 3.64 MIL/uL — ABNORMAL LOW (ref 3.87–5.11)
WBC: 5.6 10*3/uL (ref 4.0–10.5)

## 2013-06-29 LAB — URINALYSIS, ROUTINE W REFLEX MICROSCOPIC
Bilirubin Urine: NEGATIVE
Hgb urine dipstick: NEGATIVE
Ketones, ur: NEGATIVE mg/dL
Nitrite: NEGATIVE
Urobilinogen, UA: 0.2 mg/dL (ref 0.0–1.0)

## 2013-06-29 LAB — URINE MICROSCOPIC-ADD ON

## 2013-06-29 MED ORDER — ONDANSETRON HCL 4 MG/2ML IJ SOLN
4.0000 mg | Freq: Once | INTRAMUSCULAR | Status: AC
  Administered 2013-06-29: 4 mg via INTRAVENOUS
  Filled 2013-06-29: qty 2

## 2013-06-29 MED ORDER — IOHEXOL 300 MG/ML  SOLN
50.0000 mL | Freq: Once | INTRAMUSCULAR | Status: AC | PRN
  Administered 2013-06-29: 50 mL via ORAL

## 2013-06-29 MED ORDER — POTASSIUM CHLORIDE 10 MEQ/100ML IV SOLN
10.0000 meq | INTRAVENOUS | Status: DC
  Filled 2013-06-29: qty 100

## 2013-06-29 MED ORDER — SODIUM CHLORIDE 0.9 % IV BOLUS (SEPSIS)
500.0000 mL | Freq: Once | INTRAVENOUS | Status: AC
  Administered 2013-06-29: 500 mL via INTRAVENOUS

## 2013-06-29 MED ORDER — POTASSIUM CHLORIDE 20 MEQ/15ML (10%) PO LIQD
40.0000 meq | Freq: Once | ORAL | Status: AC
  Administered 2013-06-29: 40 meq via ORAL
  Filled 2013-06-29: qty 30

## 2013-06-29 NOTE — ED Notes (Signed)
Per EMS, Patient from [facility] with chronic nausea, takes Reglan, Zofran, & Phenergan at home. Patient has been taking this medications without relief.

## 2013-06-29 NOTE — ED Notes (Signed)
Patient with chronic nausea, takes Reglan, Zofran, and Phenergan at home. Patient states symptoms have been ongoing 3-4 days without relief. Patient denies any emesis. Patient abd round, firm, and distended-- states this is baseline.

## 2013-06-29 NOTE — ED Notes (Signed)
CT notified patient has completed oral contrast

## 2013-06-29 NOTE — ED Provider Notes (Signed)
Reevaluation: 19:10- she is alert and cooperative. She complains of being lonely. She is thirsty and is able to drink, at this time. We are still awaiting her abdominal ultrasound to evaluate for acute cholecystitis. It was ordered at the suggestion of the radiologist after CT imaging. The testing today, has been done to evaluate chronic nausea. Patient's daughter is in the room, currently, questions answered.   Ct Abdomen Pelvis Wo Contrast  06/29/2013   CLINICAL DATA:  Abdominal pain, chronic nausea  EXAM: CT ABDOMEN AND PELVIS WITHOUT CONTRAST  TECHNIQUE: Multidetector CT imaging of the abdomen and pelvis was performed following the standard protocol without intravenous contrast.  COMPARISON:  Prior chest CT 10/14/2012 ; prior CT abdomen/pelvis 02/15/2010.  FINDINGS: Lower Chest: Dependent atelectasis in the bilateral lower lobes. Trace right pleural effusion. Marked enlargement of the right greater than left heart. Atherosclerotic calcification noted in the mitral annulus. No large pericardial effusion. Unremarkable visualized distal thoracic esophagus.  Abdomen: Unenhanced CT was performed per clinician order. Lack of IV contrast limits sensitivity and specificity, especially for evaluation of abdominal/pelvic solid viscera. Within these limitations, unremarkable CT appearance of the stomach, duodenum, spleen, we and adrenal glands. The multi lobulated relatively low attenuation complex cystic lesion affiliated with the tail of the pancreas demonstrates slow interval enlargement compared to 2007. Currently, the mass measures 4.4 x 4.0 cm in greatest transverse diameter open (image 24, series 2) compared to 3.2 x 3.2 cm. Stable subcentimeter hypoattenuating lesion in hepatic segment 2 most consistent with a small simple cyst. Abnormal nodular hepatic contour again noted. There is trace perihepatic ascites.  Atrophy of the pancreatic head and neck. No hydronephrosis or significant nephrolithiasis.  Although no  radiopaque cholelithiasis are seen, there appears to be diffuse moderate gallbladder wall thickening.  All No evidence of obstruction or focal bowel wall thickening. Normal appendix in the right lower quadrant. The terminal ileum is unremarkable.  Pelvis: The bladder is distended with urine. Slightly limited evaluation secondary to streak artifact from right hip prosthesis. Trace free fluid in the cul-de-sac. Surgical changes of prior hysterectomy.  Bones/Soft Tissues: No acute fracture or aggressive appearing lytic or blastic osseous lesion. L3-L5 posterior lumbar interbody fusion. Advanced left hip osteoarthritis.  Vascular: Extensive atherosclerotic vascular calcifications without aneurysmal dilatation. Limited evaluation in the absence of intravenous contrast.  IMPRESSION: 1. Slow interval enlargement of a complex multiloculated cystic mass affiliated with the tail of the pancreas compared to 2011 as detailed above. Differential considerations include a pancreatic pseudocyst if the patient has a prior history of pancreatitis, and both benign and malignant cystic pancreatic neoplasm. If further imaging is clinically warranted, consider outpatient MRI of the abdomen with and without contrast. 2. Hepatic cirrhosis with small volume ascites. 3. Nonspecific mild gallbladder wall thickening may be related to the patient's underlying hepatic disease, right heart failure or hypoalbuminemia. Alternately, in the appropriate clinical setting acute cholecystitis is a possibility. If further imaging is clinically warranted, consider right upper quadrant ultrasound or nuclear medicine HIDA scan. 4. Extensive atherosclerotic vascular calcifications. 5. Marked right greater than left cardiomegaly. 6. Advanced left hip osteoarthritis. 7. Prior surgical changes of L3-L5 posterior lumbar interbody fusion right total hip arthroplasty. 8. Additional ancillary findings as above.   Electronically Signed   By: Malachy Moan M.D.    On: 06/29/2013 16:37   US Abdomen Limited Ruq  06/29/2013   CLINICAL DATA:  Assess gallbladder.  EXAM: US ABDOMEN LIMITED - RIGHT UPPER QUADRANT  COMPARISON:  CT same day.  FINDINGS: Gallbladder  No definite gallstones. Several small non mobile echogenic foci along the wall of the gallbladder without shadowing which may represent small polyps versus benign cholecystosis. There is minimal gallbladder wall thickening measuring 4.2 mm which is nonspecific and may be due to patient's known mild ascites. There is a negative sonographic Murphy sign.  Common bile duct  Diameter: 3.8 mm.  Liver:  Suggestion of mild fatty infiltration without focal mass.  IMPRESSION: No evidence of gallstones or acute cholecystitis. Mild gallbladder wall thickening of 4.2 mm which is nonspecific in the setting of mild ascites.  Several tiny echogenic foci along the wall of the gallbladder which may represent small polyps versus benign cholesterolosis.  Fatty infiltration of the liver.   Electronically Signed   By: Elberta Fortis M.D.   On: 06/29/2013 20:29    Medications  sodium chloride 0.9 % bolus 500 mL (0 mLs Intravenous Stopped 06/29/13 1502)  ondansetron (ZOFRAN) injection 4 mg (4 mg Intravenous Given 06/29/13 1432)  iohexol (OMNIPAQUE) 300 MG/ML solution 50 mL (50 mLs Oral Contrast Given 06/29/13 1432)  potassium chloride 20 MEQ/15ML (10%) liquid 40 mEq (40 mEq Oral Given 06/29/13 1650)    Patient Vitals for the past 24 hrs:  BP Temp Temp src Pulse Resp SpO2  06/29/13 1922 163/57 mmHg 97.7 F (36.5 C) Oral 74 20 97 %  06/29/13 1256 162/61 mmHg 98 F (36.7 C) Oral 60 18 92 %     9:30 PM Reevaluation with update and discussion. After initial assessment and treatment, an updated evaluation reveals no additional c/o. Prior office records reviewed; indicating chronic nausea. Charne Mcbrien L   Assessment: Chronic nausea, without acute abnormalities. Pt has polypharmacy, that may be complicating her nausea. She is  stable for d/c to her current living facility.    Flint Melter, MD 06/29/13 2146

## 2013-06-29 NOTE — ED Provider Notes (Signed)
TIME SEEN: 2:07 PM  CHIEF COMPLAINT: Nausea, unable to tolerate by mouth  HPI: Patient is a 79 year old female with a history of severe mitral regurgitation, hypertension, diabetes, CAD, CHF, A. fib no longer on anticoagulation who presents emergency department with several months of nausea. Patient's son reports the patient lives at [facility]. Over the past 2 days her nausea has been progressively worse and she has not been able to or drink. Denies any vomiting or diarrhea. No bloody stools or melena. No dysuria or hematuria. No fevers or chills. No chest pain or shortness of breath. She is scheduled to followup with Dr. Russella Dar with gastroenterology.  PCP is at [facility]  ROS: See HPI Constitutional: no fever  Eyes: no drainage  ENT: no runny nose   Cardiovascular:  no chest pain  Resp: no SOB  GI: no vomiting GU: no dysuria Integumentary: no rash  Allergy: no hives  Musculoskeletal: no leg swelling  Neurological: no slurred speech ROS otherwise negative  PAST MEDICAL HISTORY/PAST SURGICAL HISTORY:  Past Medical History  Diagnosis Date  . Inguinal hernia   . Severe mitral regurgitation     a. 12/2011 Echo: Sev MR with flail leaflet.  . Other and unspecified hyperlipidemia   . Pneumonia   . Osteoarthrosis, unspecified whether generalized or localized, unspecified site   . Unspecified essential hypertension   . Type II or unspecified type diabetes mellitus without mention of complication, not stated as uncontrolled   . Coronary atherosclerosis of unspecified type of vessel, native or graft     a. 11/2007 Cath: 3vd, sev MR ->felt to be poor surgical candidate->Med Rx  . Chronic diastolic CHF (congestive heart failure)     a. 12/2011 Echo: EF 60-65%, No rwma, Mild AI, Sev MR w/ flail segment of ant mv leflet, massive bi-atrial enlargement, Mod TR, PASP 86.  . Atrial fibrillation     a. chronic - no longer on anticoagulation.  Marland Kitchen Unspecified asthma(493.90)   . Anemia, unspecified    . Allergic rhinitis, cause unspecified   . Adenomatous colon polyp 10/2009  . GIB (gastrointestinal bleeding)     2009  . Nephrolithiasis     MEDICATIONS:  Prior to Admission medications   Medication Sig Start Date End Date Taking? Authorizing Provider  acetaminophen (TYLENOL) 325 MG tablet Take 650 mg by mouth 3 (three) times daily.   Yes Historical Provider, MD  amLODipine (NORVASC) 5 MG tablet Take 5 mg by mouth daily.   Yes Historical Provider, MD  carvedilol (COREG) 6.25 MG tablet Take 6.25 mg by mouth 2 (two) times daily with a meal.   Yes Historical Provider, MD  Cholecalciferol (VITAMIN D3) 1000 UNITS CAPS Take 1,000 Units by mouth daily.    Yes Historical Provider, MD  clonazePAM (KLONOPIN) 0.5 MG tablet Take 0.5 mg by mouth at bedtime. For anxiety   Yes Historical Provider, MD  digoxin (LANOXIN) 0.125 MG tablet Take 0.125 mg by mouth daily.   Yes Historical Provider, MD  docusate sodium (COLACE) 100 MG capsule Take 100 mg by mouth daily.   Yes Historical Provider, MD  Fe Fum-FePoly-FA-Vit C-Vit B3 (INTEGRA F) 125-1 MG CAPS Take 1 capsule by mouth daily.   Yes Historical Provider, MD  ferrous sulfate 325 (65 FE) MG tablet Take 325 mg by mouth daily with breakfast.   Yes Historical Provider, MD  furosemide (LASIX) 40 MG tablet Take 60 mg by mouth 2 (two) times daily.   Yes Historical Provider, MD  GLUCERNA Frederica Kuster) LIQD Take  237 mLs by mouth 3 (three) times daily between meals.   Yes Historical Provider, MD  HYDROcodone-acetaminophen (NORCO/VICODIN) 5-325 MG per tablet Take 1 tablet by mouth every 4 (four) hours as needed for moderate pain.   Yes Historical Provider, MD  insulin glargine (LANTUS) 100 UNIT/ML injection Inject 55 Units into the skin at bedtime.   Yes Historical Provider, MD  irbesartan (AVAPRO) 150 MG tablet Take 150 mg by mouth daily.   Yes Historical Provider, MD  loratadine (CLARITIN) 10 MG tablet Take 10 mg by mouth daily.   Yes Historical Provider, MD   metFORMIN (GLUCOPHAGE) 500 MG tablet Take 500 mg by mouth 2 (two) times daily with a meal.   Yes Historical Provider, MD  metoCLOPramide (REGLAN) 5 MG tablet Take 5 mg by mouth 3 (three) times daily.   Yes Historical Provider, MD  metolazone (ZAROXOLYN) 2.5 MG tablet Take 2.5 mg by mouth every Monday, Wednesday, and Friday.   Yes Historical Provider, MD  ondansetron (ZOFRAN) 4 MG tablet Take 4 mg by mouth every 8 (eight) hours as needed for nausea or vomiting.   Yes Historical Provider, MD  pantoprazole (PROTONIX) 40 MG tablet Take 40 mg by mouth daily before breakfast. 03/10/13  Yes Jacques Navy, MD  potassium chloride 20 MEQ/15ML (10%) solution Take 20 mEq by mouth 3 (three) times daily.   Yes Historical Provider, MD  promethazine (PHENERGAN) 12.5 MG tablet Take 12.5 mg by mouth every 6 (six) hours as needed for nausea or vomiting.   Yes Historical Provider, MD  senna-docusate (SENOKOT-S) 8.6-50 MG per tablet Take 1 tablet by mouth at bedtime.   Yes Historical Provider, MD  temazepam (RESTORIL) 15 MG capsule Take 15 mg by mouth at bedtime.   Yes Historical Provider, MD  traZODone (DESYREL) 50 MG tablet Take 25 mg by mouth at bedtime.   Yes Historical Provider, MD  ONE TOUCH ULTRA TEST test strip CHECK BLOOD GLUCOSE TWICE A DAY 05/04/13   Jacques Navy, MD  RELION MINI PEN NEEDLES 31G X 6 MM MISC 1 Container. 02/25/12   Historical Provider, MD    ALLERGIES:  Allergies  Allergen Reactions  . Codeine     REACTION: Hives    SOCIAL HISTORY:  History  Substance Use Topics  . Smoking status: Never Smoker   . Smokeless tobacco: Never Used  . Alcohol Use: No    FAMILY HISTORY: Family History  Problem Relation Age of Onset  . Heart disease Father     CHF  . Lymphoma Sister   . Colon cancer Neg Hx     EXAM: BP 162/61  Pulse 60  Temp(Src) 98 F (36.7 C) (Oral)  Resp 18  SpO2 92% CONSTITUTIONAL: Alert and oriented and responds appropriately to questions. Well-appearing;  well-nourished HEAD: Normocephalic EYES: Conjunctivae clear, PERRL ENT: normal nose; no rhinorrhea; dry mucous membranes; pharynx without lesions noted NECK: Supple, no meningismus, no LAD  CARD: RRR; S1 and S2 appreciated; + systolic murmur heard best at apex, no clicks, no rubs, no gallops RESP: Normal chest excursion without splinting or tachypnea; breath sounds clear and equal bilaterally; no wheezes, no rhonchi, no rales,  ABD/GI: Normal bowel sounds; non-distended; soft, mild tenderness to palpation diffusely without guarding or rebound, no peritoneal signs BACK:  The back appears normal and is non-tender to palpation, there is no CVA tenderness EXT: Normal ROM in all joints; non-tender to palpation; no edema; normal capillary refill; no cyanosis    SKIN: Normal color for age  and race; warm NEURO: Moves all extremities equally PSYCH: The patient's mood and manner are appropriate. Grooming and personal hygiene are appropriate.  MEDICAL DECISION MAKING: Patient here with nausea for the past several months is progressively got worse. She is hemodynamically stable. Her abdomen is mildly tender to palpation diffusely. Will obtain abdominal labs, troponin, urine and CT of her abdomen and pelvis. We'll give IV fluids and anti-emetics. Patient may need admission if she is unable to tolerate by mouth to keep herself hydrated. Her EKG shows improving ST depression in lateral leads. No new ischemic changes.  ED PROGRESS: Patient has no leukocytosis. Her potassium is slightly low at 3.0. Will replace. Her creatinine is slightly elevated at 1.22. She has mild elevation of her total bilirubin and alkaline phosphatase. Urine shows no sign of infection. Her troponin is negative. CT of her abdomen and pelvis is currently pending.  CT scan shows slow interval enlargement of a complex multiloculated cystic mass in the tail of the pancreas that may be a peak rate is a cyst versus the neoplasm. Outpatient MRI is  recommended. Her lipase today is normal. She also has hepatic cirrhosis with small volume ascites. Patient also was noted to have gallbladder wall thickening. Differential does include acute cholecystitis. Given her persistent nausea and mild elevation for ALP and T bili, will perform right upper quadrant ultrasound today.  Signed out to Dr. Effie Shy.   EKG Interpretation    Date/Time:  Thursday June 29 2013 14:18:01 EST Ventricular Rate:  69 PR Interval:    QRS Duration: 106 QT Interval:  413 QTC Calculation: 442 R Axis:   84 Text Interpretation:  Atrial fibrillation Borderline right axis deviation LVH with secondary repolarization abnormality Anterior Q waves, possibly due to LVH ST depression in lateral leads that has improved compared to ECG on 10/11/12 Baseline wander in lead(s) V3 Confirmed by Harlie Ragle  DO, Hanzel Pizzo ([id number]) on 06/29/2013 2:47:48 PM             Layla Maw Merrily Tegeler, DO 06/29/13 1643

## 2013-06-29 NOTE — ED Notes (Signed)
Bed: WA02 Expected date:  Expected time:  Means of arrival:  Comments: EMS-chronic nausea

## 2013-06-29 NOTE — ED Notes (Signed)
Dr Effie Shy aware of pts c/o sob 02 3liters applied pox 92-97%

## 2013-06-30 ENCOUNTER — Telehealth: Payer: Self-pay

## 2013-06-30 ENCOUNTER — Encounter (HOSPITAL_COMMUNITY): Payer: Self-pay | Admitting: Emergency Medicine

## 2013-06-30 ENCOUNTER — Inpatient Hospital Stay (HOSPITAL_COMMUNITY)
Admission: EM | Admit: 2013-06-30 | Discharge: 2013-07-11 | DRG: 392 | Disposition: A | Discharge status: Hospice | Payer: Medicare Other | Attending: Internal Medicine | Admitting: Internal Medicine

## 2013-06-30 ENCOUNTER — Inpatient Hospital Stay (HOSPITAL_COMMUNITY): Payer: Medicare Other

## 2013-06-30 DIAGNOSIS — M169 Osteoarthritis of hip, unspecified: Secondary | ICD-10-CM | POA: Diagnosis present

## 2013-06-30 DIAGNOSIS — R141 Gas pain: Secondary | ICD-10-CM | POA: Diagnosis present

## 2013-06-30 DIAGNOSIS — I5043 Acute on chronic combined systolic (congestive) and diastolic (congestive) heart failure: Secondary | ICD-10-CM

## 2013-06-30 DIAGNOSIS — M79609 Pain in unspecified limb: Secondary | ICD-10-CM

## 2013-06-30 DIAGNOSIS — F411 Generalized anxiety disorder: Secondary | ICD-10-CM | POA: Diagnosis present

## 2013-06-30 DIAGNOSIS — I2510 Atherosclerotic heart disease of native coronary artery without angina pectoris: Secondary | ICD-10-CM

## 2013-06-30 DIAGNOSIS — M1610 Unilateral primary osteoarthritis, unspecified hip: Secondary | ICD-10-CM | POA: Diagnosis present

## 2013-06-30 DIAGNOSIS — D649 Anemia, unspecified: Secondary | ICD-10-CM | POA: Diagnosis present

## 2013-06-30 DIAGNOSIS — I10 Essential (primary) hypertension: Secondary | ICD-10-CM

## 2013-06-30 DIAGNOSIS — K319 Disease of stomach and duodenum, unspecified: Secondary | ICD-10-CM | POA: Diagnosis present

## 2013-06-30 DIAGNOSIS — E162 Hypoglycemia, unspecified: Secondary | ICD-10-CM

## 2013-06-30 DIAGNOSIS — Z981 Arthrodesis status: Secondary | ICD-10-CM

## 2013-06-30 DIAGNOSIS — E876 Hypokalemia: Secondary | ICD-10-CM

## 2013-06-30 DIAGNOSIS — T454X5A Adverse effect of iron and its compounds, initial encounter: Secondary | ICD-10-CM | POA: Diagnosis present

## 2013-06-30 DIAGNOSIS — I080 Rheumatic disorders of both mitral and aortic valves: Secondary | ICD-10-CM

## 2013-06-30 DIAGNOSIS — R188 Other ascites: Secondary | ICD-10-CM | POA: Diagnosis present

## 2013-06-30 DIAGNOSIS — R142 Eructation: Secondary | ICD-10-CM | POA: Diagnosis present

## 2013-06-30 DIAGNOSIS — K8689 Other specified diseases of pancreas: Secondary | ICD-10-CM | POA: Diagnosis present

## 2013-06-30 DIAGNOSIS — Z79899 Other long term (current) drug therapy: Secondary | ICD-10-CM

## 2013-06-30 DIAGNOSIS — M1990 Unspecified osteoarthritis, unspecified site: Secondary | ICD-10-CM | POA: Diagnosis present

## 2013-06-30 DIAGNOSIS — E875 Hyperkalemia: Secondary | ICD-10-CM | POA: Diagnosis present

## 2013-06-30 DIAGNOSIS — D469 Myelodysplastic syndrome, unspecified: Secondary | ICD-10-CM

## 2013-06-30 DIAGNOSIS — K862 Cyst of pancreas: Secondary | ICD-10-CM | POA: Diagnosis present

## 2013-06-30 DIAGNOSIS — Z791 Long term (current) use of non-steroidal anti-inflammatories (NSAID): Secondary | ICD-10-CM

## 2013-06-30 DIAGNOSIS — E785 Hyperlipidemia, unspecified: Secondary | ICD-10-CM | POA: Diagnosis present

## 2013-06-30 DIAGNOSIS — I4891 Unspecified atrial fibrillation: Secondary | ICD-10-CM

## 2013-06-30 DIAGNOSIS — K766 Portal hypertension: Secondary | ICD-10-CM | POA: Diagnosis present

## 2013-06-30 DIAGNOSIS — R195 Other fecal abnormalities: Secondary | ICD-10-CM

## 2013-06-30 DIAGNOSIS — K838 Other specified diseases of biliary tract: Secondary | ICD-10-CM | POA: Diagnosis present

## 2013-06-30 DIAGNOSIS — N390 Urinary tract infection, site not specified: Secondary | ICD-10-CM

## 2013-06-30 DIAGNOSIS — N289 Disorder of kidney and ureter, unspecified: Secondary | ICD-10-CM | POA: Diagnosis present

## 2013-06-30 DIAGNOSIS — R413 Other amnesia: Secondary | ICD-10-CM

## 2013-06-30 DIAGNOSIS — K2970 Gastritis, unspecified, without bleeding: Secondary | ICD-10-CM

## 2013-06-30 DIAGNOSIS — Z515 Encounter for palliative care: Secondary | ICD-10-CM

## 2013-06-30 DIAGNOSIS — I059 Rheumatic mitral valve disease, unspecified: Secondary | ICD-10-CM | POA: Diagnosis present

## 2013-06-30 DIAGNOSIS — E119 Type 2 diabetes mellitus without complications: Secondary | ICD-10-CM | POA: Diagnosis present

## 2013-06-30 DIAGNOSIS — R110 Nausea: Secondary | ICD-10-CM

## 2013-06-30 DIAGNOSIS — R627 Adult failure to thrive: Secondary | ICD-10-CM | POA: Diagnosis present

## 2013-06-30 DIAGNOSIS — K921 Melena: Secondary | ICD-10-CM

## 2013-06-30 DIAGNOSIS — I509 Heart failure, unspecified: Secondary | ICD-10-CM

## 2013-06-30 DIAGNOSIS — Z96649 Presence of unspecified artificial hip joint: Secondary | ICD-10-CM

## 2013-06-30 DIAGNOSIS — Z87440 Personal history of urinary (tract) infections: Secondary | ICD-10-CM

## 2013-06-30 DIAGNOSIS — T383X5A Adverse effect of insulin and oral hypoglycemic [antidiabetic] drugs, initial encounter: Secondary | ICD-10-CM | POA: Diagnosis present

## 2013-06-30 DIAGNOSIS — E039 Hypothyroidism, unspecified: Secondary | ICD-10-CM | POA: Diagnosis present

## 2013-06-30 DIAGNOSIS — R739 Hyperglycemia, unspecified: Secondary | ICD-10-CM

## 2013-06-30 DIAGNOSIS — K3189 Other diseases of stomach and duodenum: Secondary | ICD-10-CM

## 2013-06-30 DIAGNOSIS — K625 Hemorrhage of anus and rectum: Secondary | ICD-10-CM

## 2013-06-30 DIAGNOSIS — E1169 Type 2 diabetes mellitus with other specified complication: Secondary | ICD-10-CM | POA: Diagnosis present

## 2013-06-30 DIAGNOSIS — R339 Retention of urine, unspecified: Secondary | ICD-10-CM | POA: Diagnosis not present

## 2013-06-30 DIAGNOSIS — I5032 Chronic diastolic (congestive) heart failure: Secondary | ICD-10-CM

## 2013-06-30 DIAGNOSIS — Z66 Do not resuscitate: Secondary | ICD-10-CM | POA: Diagnosis not present

## 2013-06-30 DIAGNOSIS — K3184 Gastroparesis: Secondary | ICD-10-CM | POA: Diagnosis present

## 2013-06-30 DIAGNOSIS — K7460 Unspecified cirrhosis of liver: Secondary | ICD-10-CM | POA: Diagnosis present

## 2013-06-30 DIAGNOSIS — R410 Disorientation, unspecified: Secondary | ICD-10-CM

## 2013-06-30 DIAGNOSIS — Z794 Long term (current) use of insulin: Secondary | ICD-10-CM

## 2013-06-30 DIAGNOSIS — D638 Anemia in other chronic diseases classified elsewhere: Secondary | ICD-10-CM | POA: Diagnosis present

## 2013-06-30 DIAGNOSIS — J45909 Unspecified asthma, uncomplicated: Secondary | ICD-10-CM | POA: Diagnosis present

## 2013-06-30 DIAGNOSIS — IMO0001 Reserved for inherently not codable concepts without codable children: Secondary | ICD-10-CM | POA: Diagnosis present

## 2013-06-30 LAB — URINE MICROSCOPIC-ADD ON

## 2013-06-30 LAB — COMPREHENSIVE METABOLIC PANEL
ALT: 20 U/L (ref 0–35)
AST: 30 U/L (ref 0–37)
Albumin: 3.6 g/dL (ref 3.5–5.2)
Alkaline Phosphatase: 120 U/L — ABNORMAL HIGH (ref 39–117)
BUN: 38 mg/dL — ABNORMAL HIGH (ref 6–23)
CO2: 31 mEq/L (ref 19–32)
Calcium: 10.1 mg/dL (ref 8.4–10.5)
Chloride: 92 mEq/L — ABNORMAL LOW (ref 96–112)
Creatinine, Ser: 1.04 mg/dL (ref 0.50–1.10)
GFR calc Af Amer: 53 mL/min — ABNORMAL LOW (ref >90)
GFR calc non Af Amer: 46 mL/min — ABNORMAL LOW (ref >90)
Glucose, Bld: 185 mg/dL — ABNORMAL HIGH (ref 70–99)
Potassium: 2.7 mEq/L — CL (ref 3.5–5.1)
Sodium: 137 mEq/L (ref 135–145)
Total Bilirubin: 2.9 mg/dL — ABNORMAL HIGH (ref 0.3–1.2)
Total Protein: 7.6 g/dL (ref 6.0–8.3)

## 2013-06-30 LAB — CBC WITH DIFFERENTIAL/PLATELET
Basophils Absolute: 0 10*3/uL (ref 0.0–0.1)
Eosinophils Relative: 0 % (ref 0–5)
HCT: 33.5 % — ABNORMAL LOW (ref 36.0–46.0)
Lymphocytes Relative: 4 % — ABNORMAL LOW (ref 12–46)
Lymphs Abs: 0.4 10*3/uL — ABNORMAL LOW (ref 0.7–4.0)
MCV: 90.8 fL (ref 78.0–100.0)
Monocytes Absolute: 1.3 10*3/uL — ABNORMAL HIGH (ref 0.1–1.0)
Neutro Abs: 10 10*3/uL — ABNORMAL HIGH (ref 1.7–7.7)
Neutrophils Relative %: 86 % — ABNORMAL HIGH (ref 43–77)
RDW: 16.5 % — ABNORMAL HIGH (ref 11.5–15.5)
WBC: 11.7 10*3/uL — ABNORMAL HIGH (ref 4.0–10.5)

## 2013-06-30 LAB — BASIC METABOLIC PANEL
BUN: 36 mg/dL — ABNORMAL HIGH (ref 6–23)
Chloride: 92 mEq/L — ABNORMAL LOW (ref 96–112)
Creatinine, Ser: 1.15 mg/dL — ABNORMAL HIGH (ref 0.50–1.10)
GFR calc non Af Amer: 40 mL/min — ABNORMAL LOW (ref >90)
Glucose, Bld: 143 mg/dL — ABNORMAL HIGH (ref 70–99)
Potassium: 2.9 mEq/L — ABNORMAL LOW (ref 3.5–5.1)
Sodium: 135 mEq/L (ref 135–145)

## 2013-06-30 LAB — CBC
HCT: 32.2 % — ABNORMAL LOW (ref 36.0–46.0)
HCT: 31.8 % — ABNORMAL LOW (ref 36.0–46.0)
MCH: 28.5 pg (ref 26.0–34.0)
MCH: 28.8 pg (ref 26.0–34.0)
MCHC: 31.8 g/dL (ref 30.0–36.0)
MCV: 91 fL (ref 78.0–100.0)
MCV: 90.6 fL (ref 78.0–100.0)
Platelets: 152 10*3/uL (ref 150–400)
RBC: 3.54 MIL/uL — ABNORMAL LOW (ref 3.87–5.11)
RDW: 16.6 % — ABNORMAL HIGH (ref 11.5–15.5)
WBC: 10.8 10*3/uL — ABNORMAL HIGH (ref 4.0–10.5)
WBC: 10.1 10*3/uL (ref 4.0–10.5)

## 2013-06-30 LAB — GLUCOSE, CAPILLARY
Glucose-Capillary: 150 mg/dL — ABNORMAL HIGH (ref 70–99)
Glucose-Capillary: 147 mg/dL — ABNORMAL HIGH (ref 70–99)

## 2013-06-30 LAB — URINALYSIS, ROUTINE W REFLEX MICROSCOPIC
Nitrite: NEGATIVE
Specific Gravity, Urine: 1.012 (ref 1.005–1.030)
Urobilinogen, UA: 1 mg/dL (ref 0.0–1.0)

## 2013-06-30 LAB — PROTIME-INR: INR: 1.2 (ref 0.00–1.49)

## 2013-06-30 LAB — LIPASE, BLOOD: Lipase: 16 U/L (ref 11–59)

## 2013-06-30 LAB — CG4 I-STAT (LACTIC ACID): Lactic Acid, Venous: 1.96 mmol/L (ref 0.5–2.2)

## 2013-06-30 LAB — DIGOXIN LEVEL: Digoxin Level: 1.1 ng/mL (ref 0.8–2.0)

## 2013-06-30 MED ORDER — SODIUM CHLORIDE 0.9 % IJ SOLN
3.0000 mL | Freq: Two times a day (BID) | INTRAMUSCULAR | Status: DC
  Administered 2013-06-30 – 2013-07-07 (×9): 3 mL via INTRAVENOUS

## 2013-06-30 MED ORDER — CLONAZEPAM 0.5 MG PO TABS
0.5000 mg | ORAL_TABLET | Freq: Every day | ORAL | Status: DC
  Administered 2013-07-06 – 2013-07-07 (×2): 0.5 mg via ORAL
  Filled 2013-06-30 (×5): qty 1

## 2013-06-30 MED ORDER — ONDANSETRON HCL 4 MG PO TABS
4.0000 mg | ORAL_TABLET | Freq: Four times a day (QID) | ORAL | Status: DC | PRN
  Administered 2013-07-01: 17:00:00 4 mg via ORAL
  Filled 2013-06-30: qty 1

## 2013-06-30 MED ORDER — INSULIN ASPART 100 UNIT/ML ~~LOC~~ SOLN
0.0000 [IU] | SUBCUTANEOUS | Status: DC
  Administered 2013-06-30 – 2013-07-01 (×5): 1 [IU] via SUBCUTANEOUS

## 2013-06-30 MED ORDER — CARVEDILOL 6.25 MG PO TABS
6.2500 mg | ORAL_TABLET | Freq: Two times a day (BID) | ORAL | Status: DC
  Administered 2013-06-30 – 2013-07-08 (×13): 6.25 mg via ORAL
  Filled 2013-06-30 (×18): qty 1

## 2013-06-30 MED ORDER — DEXTROSE 5 % IV SOLN
1.0000 g | INTRAVENOUS | Status: DC
  Administered 2013-06-30 – 2013-07-02 (×3): 1 g via INTRAVENOUS
  Filled 2013-06-30 (×3): qty 10

## 2013-06-30 MED ORDER — ACETAMINOPHEN 325 MG PO TABS
650.0000 mg | ORAL_TABLET | Freq: Four times a day (QID) | ORAL | Status: DC | PRN
  Administered 2013-06-30 – 2013-07-06 (×6): 650 mg via ORAL
  Filled 2013-06-30 (×7): qty 2

## 2013-06-30 MED ORDER — POTASSIUM CHLORIDE 10 MEQ/100ML IV SOLN
10.0000 meq | INTRAVENOUS | Status: AC
Start: 2013-06-30 — End: 2013-07-01
  Administered 2013-06-30 – 2013-07-01 (×5): 10 meq via INTRAVENOUS
  Filled 2013-06-30 (×5): qty 100

## 2013-06-30 MED ORDER — POTASSIUM CHLORIDE 10 MEQ/100ML IV SOLN
10.0000 meq | INTRAVENOUS | Status: DC
  Administered 2013-06-30: 10 meq via INTRAVENOUS
  Filled 2013-06-30 (×5): qty 100

## 2013-06-30 MED ORDER — INSULIN GLARGINE 100 UNIT/ML ~~LOC~~ SOLN
15.0000 [IU] | Freq: Every day | SUBCUTANEOUS | Status: DC
  Administered 2013-06-30 – 2013-07-04 (×5): 15 [IU] via SUBCUTANEOUS
  Filled 2013-06-30 (×5): qty 0.15

## 2013-06-30 MED ORDER — SODIUM CHLORIDE 0.9 % IV BOLUS (SEPSIS)
1000.0000 mL | Freq: Once | INTRAVENOUS | Status: AC
  Administered 2013-06-30: 1000 mL via INTRAVENOUS

## 2013-06-30 MED ORDER — FUROSEMIDE 10 MG/ML IJ SOLN
40.0000 mg | Freq: Once | INTRAMUSCULAR | Status: AC
  Administered 2013-06-30: 40 mg via INTRAVENOUS
  Filled 2013-06-30: qty 4

## 2013-06-30 MED ORDER — POTASSIUM CHLORIDE 10 MEQ/100ML IV SOLN
10.0000 meq | INTRAVENOUS | Status: DC
  Filled 2013-06-30 (×4): qty 100

## 2013-06-30 MED ORDER — DIGOXIN 125 MCG PO TABS
0.1250 mg | ORAL_TABLET | Freq: Every day | ORAL | Status: DC
  Administered 2013-07-01 – 2013-07-06 (×6): 0.125 mg via ORAL
  Filled 2013-06-30 (×8): qty 1

## 2013-06-30 MED ORDER — POTASSIUM CHLORIDE IN NACL 20-0.9 MEQ/L-% IV SOLN
INTRAVENOUS | Status: DC
  Administered 2013-06-30: 18:00:00 via INTRAVENOUS
  Filled 2013-06-30: qty 1000

## 2013-06-30 MED ORDER — PANTOPRAZOLE SODIUM 40 MG IV SOLR
40.0000 mg | Freq: Two times a day (BID) | INTRAVENOUS | Status: DC
  Administered 2013-07-01: 10:00:00 40 mg via INTRAVENOUS
  Filled 2013-06-30 (×2): qty 40

## 2013-06-30 MED ORDER — ONDANSETRON HCL 4 MG/2ML IJ SOLN
4.0000 mg | Freq: Four times a day (QID) | INTRAMUSCULAR | Status: DC | PRN
  Administered 2013-07-01: 4 mg via INTRAVENOUS
  Filled 2013-06-30: qty 2

## 2013-06-30 MED ORDER — ACETAMINOPHEN 650 MG RE SUPP: 650.0000 mg | Freq: Four times a day (QID) | RECTAL | Status: DC | PRN

## 2013-06-30 MED ORDER — PANTOPRAZOLE SODIUM 40 MG IV SOLR
40.0000 mg | Freq: Once | INTRAVENOUS | Status: AC
  Administered 2013-06-30: 40 mg via INTRAVENOUS
  Filled 2013-06-30: qty 40

## 2013-06-30 MED ORDER — FUROSEMIDE 40 MG PO TABS
40.0000 mg | ORAL_TABLET | Freq: Two times a day (BID) | ORAL | Status: DC
  Administered 2013-07-01: 10:00:00 40 mg via ORAL
  Filled 2013-06-30 (×3): qty 1

## 2013-06-30 MED ORDER — CLONAZEPAM 0.5 MG PO TABS
0.5000 mg | ORAL_TABLET | Freq: Every day | ORAL | Status: DC
  Administered 2013-06-30 – 2013-07-05 (×6): 0.5 mg via ORAL
  Filled 2013-06-30 (×3): qty 1

## 2013-06-30 NOTE — H&P (Addendum)
[facility] History and Physical  [name] ZOX:[id number] DOB: [birth year] DOA: 06/30/2013   PCP: Illene Regulus, MD  Specialists: Her gastroenterologist is Dr. Russella Dar with [facility]. Her cardiologist is Dr. Burna Forts  Chief Complaint: Profound nausea worse over the last few days, rectal bleeding since earlier today  HPI: [name] is a 79 y.o. female with a past medical history of severe mitral regurgitation, 3 vessel coronary artery disease, on medical management, hypertension, diabetes, history of atrial fibrillation, not on anticoagulation, history of anemia, who has had persistent nausea for the last many months. History was provided by daughter as patient refused to answer questions due to feeling tired. Nausea has been evaluated by her primary care physician as well as by gastroenterology. Apparently, there was a plan to do endoscopy in the near future. However, the nausea apparently got worse in the last few days. Patient's family spoke to her primary care physician and he was trying to facilitate a visit with the gastroenterologist. However, her symptoms got worse on Thursday, and she presented to the emergency department. She underwent a CT scan and was managed symptomatically and was discharged back to her assisted living facility. She was being observed there today when she was noted to have bleeding per rectum. It was small quantity but she did have 2-3 episodes of the same. Patient admits to black stools, but this has been on and off for the last many months. This has been attributed in the past to Iron tablets that she takes for anemia. Patient however, denies any vomiting. Denies any fever or chills. No abdominal pain. She does feel achy all over. Denies any weight loss over the last 6 months to one year. In the emergency department she was found to have severe hyperkalemia. Hemoglobin is at baseline. She's also noted to have a urinary tract infection.  Home Medications: Prior  to Admission medications   Medication Sig Start Date End Date Taking? Authorizing Provider  acetaminophen (TYLENOL) 325 MG tablet Take 650 mg by mouth 3 (three) times daily.   Yes Historical Provider, MD  amLODipine (NORVASC) 5 MG tablet Take 5 mg by mouth daily.   Yes Historical Provider, MD  carvedilol (COREG) 6.25 MG tablet Take 6.25 mg by mouth 2 (two) times daily with a meal.   Yes Historical Provider, MD  Cholecalciferol (VITAMIN D3) 1000 UNITS CAPS Take 1,000 Units by mouth daily.    Yes Historical Provider, MD  clonazePAM (KLONOPIN) 0.5 MG tablet Take 0.5 mg by mouth at bedtime. For anxiety   Yes Historical Provider, MD  digoxin (LANOXIN) 0.125 MG tablet Take 0.125 mg by mouth daily.   Yes Historical Provider, MD  docusate sodium (COLACE) 100 MG capsule Take 100 mg by mouth daily.   Yes Historical Provider, MD  Fe Fum-FePoly-FA-Vit C-Vit B3 (INTEGRA F) 125-1 MG CAPS Take 1 capsule by mouth daily.   Yes Historical Provider, MD  ferrous sulfate 325 (65 FE) MG tablet Take 325 mg by mouth daily with breakfast.   Yes Historical Provider, MD  furosemide (LASIX) 40 MG tablet Take 60 mg by mouth 2 (two) times daily.   Yes Historical Provider, MD  GLUCERNA (GLUCERNA) LIQD Take 237 mLs by mouth 3 (three) times daily between meals.   Yes Historical Provider, MD  HYDROcodone-acetaminophen (NORCO/VICODIN) 5-325 MG per tablet Take 1 tablet by mouth every 4 (four) hours as needed for moderate pain.   Yes Historical Provider, MD  insulin glargine (LANTUS) 100 UNIT/ML injection Inject 55  Units into the skin at bedtime.   Yes Historical Provider, MD  irbesartan (AVAPRO) 150 MG tablet Take 150 mg by mouth daily.   Yes Historical Provider, MD  loratadine (CLARITIN) 10 MG tablet Take 10 mg by mouth daily.   Yes Historical Provider, MD  metFORMIN (GLUCOPHAGE) 500 MG tablet Take 500 mg by mouth 2 (two) times daily with a meal.   Yes Historical Provider, MD  metoCLOPramide (REGLAN) 5 MG tablet Take 5 mg by mouth 3  (three) times daily.   Yes Historical Provider, MD  ondansetron (ZOFRAN) 4 MG tablet Take 4 mg by mouth every 8 (eight) hours as needed for nausea or vomiting.   Yes Historical Provider, MD  ONE TOUCH ULTRA TEST test strip CHECK BLOOD GLUCOSE TWICE A DAY 05/04/13  Yes Jacques Navy, MD  pantoprazole (PROTONIX) 40 MG tablet Take 40 mg by mouth daily before breakfast. 03/10/13  Yes Jacques Navy, MD  potassium chloride 20 MEQ/15ML (10%) solution Take 20 mEq by mouth 3 (three) times daily.   Yes Historical Provider, MD  promethazine (PHENERGAN) 12.5 MG tablet Take 12.5 mg by mouth every 6 (six) hours as needed for nausea or vomiting.   Yes Historical Provider, MD  RELION MINI PEN NEEDLES 31G X 6 MM MISC 1 Container. 02/25/12  Yes Historical Provider, MD  senna-docusate (SENOKOT-S) 8.6-50 MG per tablet Take 1 tablet by mouth at bedtime.   Yes Historical Provider, MD  temazepam (RESTORIL) 15 MG capsule Take 15 mg by mouth at bedtime.   Yes Historical Provider, MD  traZODone (DESYREL) 50 MG tablet Take 25 mg by mouth at bedtime.   Yes Historical Provider, MD    Allergies:  Allergies  Allergen Reactions  . Codeine     REACTION: Hives    Past Medical History: Past Medical History  Diagnosis Date  . Inguinal hernia   . Severe mitral regurgitation     a. 12/2011 Echo: Sev MR with flail leaflet.  . Other and unspecified hyperlipidemia   . Pneumonia   . Osteoarthrosis, unspecified whether generalized or localized, unspecified site   . Unspecified essential hypertension   . Type II or unspecified type diabetes mellitus without mention of complication, not stated as uncontrolled   . Coronary atherosclerosis of unspecified type of vessel, native or graft     a. 11/2007 Cath: 3vd, sev MR ->felt to be poor surgical candidate->Med Rx  . Chronic diastolic CHF (congestive heart failure)     a. 12/2011 Echo: EF 60-65%, No rwma, Mild AI, Sev MR w/ flail segment of ant mv leflet, massive bi-atrial  enlargement, Mod TR, PASP 86.  . Atrial fibrillation     a. chronic - no longer on anticoagulation.  Marland Kitchen Unspecified asthma(493.90)   . Anemia, unspecified   . Allergic rhinitis, cause unspecified   . Adenomatous colon polyp 10/2009  . GIB (gastrointestinal bleeding)     2009  . Nephrolithiasis     Past Surgical History  Procedure Laterality Date  . Breast cyst aspiration      x50  . Abdominal hysterectomy  1990  . Lumbar fusion  2007    [facility]  . Total hip arthroplasty  01/2007    Right - Dr Valentina Gu  . Inguinal hernia repair  2010    [facility]    Social History: Patient lives in an assisted living facility. She has a walker, but mostly gets around in a wheelchair. No smoking, alcohol or illicit drug use. She's accompanied by her  daughter today.  Family History:  Family History  Problem Relation Age of Onset  . Heart disease Father     CHF  . Lymphoma Sister   . Colon cancer Neg Hx      Review of Systems - Unable to obtain as patient was reluctant to answer questions  Physical Examination  Filed Vitals:   06/30/13 1342 06/30/13 1349  BP:  126/57  Pulse:  84  Temp:  98.6 F (37 C)  TempSrc:  Oral  Resp:  16  SpO2: 90% 94%    General appearance: alert, appears stated age, fatigued and no distress Head: Normocephalic, without obvious abnormality, atraumatic Eyes: conjunctivae/corneas clear. PERRL, EOM's intact.  Throat: Dry Mucus membranes Neck: no adenopathy, no carotid bruit, no JVD, supple, symmetrical, trachea midline and thyroid not enlarged, symmetric, no tenderness/mass/nodules Resp: Decreased air entry at the bases without any crackles or wheezing. Cardio: regular rate and rhythm, S1, S2 normal, no murmur, click, rub or gallop GI: Abdomen was noted to be slightly distended, without any tenderness. No masses, or organomegaly. Bowel sounds are present. Extremities: Right lower treatment. He was noted to be larger compared to the left. No calf tenderness. No  pitting edema. Pulses: 2+ and symmetric Skin: Skin color, texture, turgor normal. No rashes or lesions Lymph nodes: Cervical, supraclavicular, and axillary nodes normal. Neurologic: She was alert. Appeared to be in some discomfort due to chronic back pain. No cranial nerve deficits. Motor strength equal bilateral upper and lower extremities. Gait not assessed.  Laboratory Data: Results for orders placed during the hospital encounter of 06/30/13 (from the past 48 hour(s))  CBC WITH DIFFERENTIAL     Status: Abnormal   Collection Time    06/30/13  2:03 PM      Result Value Range   WBC 11.7 (*) 4.0 - 10.5 K/uL   RBC 3.69 (*) 3.87 - 5.11 MIL/uL   Hemoglobin 10.5 (*) 12.0 - 15.0 g/dL   HCT 16.1 (*) 09.6 - 04.5 %   MCV 90.8  78.0 - 100.0 fL   MCH 28.5  26.0 - 34.0 pg   MCHC 31.3  30.0 - 36.0 g/dL   RDW 40.9 (*) 81.1 - 91.4 %   Platelets 178  150 - 400 K/uL   Neutrophils Relative % 86 (*) 43 - 77 %   Neutro Abs 10.0 (*) 1.7 - 7.7 K/uL   Lymphocytes Relative 4 (*) 12 - 46 %   Lymphs Abs 0.4 (*) 0.7 - 4.0 K/uL   Monocytes Relative 11  3 - 12 %   Monocytes Absolute 1.3 (*) 0.1 - 1.0 K/uL   Eosinophils Relative 0  0 - 5 %   Eosinophils Absolute 0.0  0.0 - 0.7 K/uL   Basophils Relative 0  0 - 1 %   Basophils Absolute 0.0  0.0 - 0.1 K/uL  COMPREHENSIVE METABOLIC PANEL     Status: Abnormal   Collection Time    06/30/13  2:03 PM      Result Value Range   Sodium 137  135 - 145 mEq/L   Potassium 2.7 (*) 3.5 - 5.1 mEq/L   Comment: CRITICAL RESULT CALLED TO, READ BACK BY AND VERIFIED WITH:     WEST,D. AT 1528 ON 11.21.14 BY LOVE,T.   Chloride 92 (*) 96 - 112 mEq/L   CO2 31  19 - 32 mEq/L   Glucose, Bld 185 (*) 70 - 99 mg/dL   BUN 38 (*) 6 - 23 mg/dL   Creatinine,  Ser 1.04  0.50 - 1.10 mg/dL   Calcium 10.2  8.4 - 72.5 mg/dL   Total Protein 7.6  6.0 - 8.3 g/dL   Albumin 3.6  3.5 - 5.2 g/dL   AST 30  0 - 37 U/L   ALT 20  0 - 35 U/L   Alkaline Phosphatase 120 (*) 39 - 117 U/L   Total  Bilirubin 2.9 (*) 0.3 - 1.2 mg/dL   GFR calc non Af Amer 46 (*) >90 mL/min   GFR calc Af Amer 53 (*) >90 mL/min   Comment: (NOTE)     The eGFR has been calculated using the CKD EPI equation.     This calculation has not been validated in all clinical situations.     eGFR's persistently <90 mL/min signify possible Chronic Kidney     Disease.  LIPASE, BLOOD     Status: None   Collection Time    06/30/13  2:03 PM      Result Value Range   Lipase 16  11 - 59 U/L  APTT     Status: Abnormal   Collection Time    06/30/13  2:03 PM      Result Value Range   aPTT 39 (*) 24 - 37 seconds   Comment:            IF BASELINE aPTT IS ELEVATED,     SUGGEST PATIENT RISK ASSESSMENT     BE USED TO DETERMINE APPROPRIATE     ANTICOAGULANT THERAPY.  PROTIME-INR     Status: None   Collection Time    06/30/13  2:03 PM      Result Value Range   Prothrombin Time 14.9  11.6 - 15.2 seconds   INR 1.20  0.00 - 1.49  CG4 I-STAT (LACTIC ACID)     Status: None   Collection Time    06/30/13  2:15 PM      Result Value Range   Lactic Acid, Venous 1.96  0.5 - 2.2 mmol/L  URINALYSIS, ROUTINE W REFLEX MICROSCOPIC     Status: Abnormal   Collection Time    06/30/13  2:43 PM      Result Value Range   Color, Urine YELLOW  YELLOW   APPearance CLOUDY (*) CLEAR   Specific Gravity, Urine 1.012  1.005 - 1.030   pH 7.0  5.0 - 8.0   Glucose, UA NEGATIVE  NEGATIVE mg/dL   Hgb urine dipstick MODERATE (*) NEGATIVE   Bilirubin Urine NEGATIVE  NEGATIVE   Ketones, ur NEGATIVE  NEGATIVE mg/dL   Protein, ur NEGATIVE  NEGATIVE mg/dL   Urobilinogen, UA 1.0  0.0 - 1.0 mg/dL   Nitrite NEGATIVE  NEGATIVE   Leukocytes, UA LARGE (*) NEGATIVE  URINE MICROSCOPIC-ADD ON     Status: Abnormal   Collection Time    06/30/13  2:43 PM      Result Value Range   Squamous Epithelial / LPF FEW (*) RARE   WBC, UA 11-20  <3 WBC/hpf   RBC / HPF 7-10  <3 RBC/hpf   Bacteria, UA RARE  RARE    Radiology Reports: Ct Abdomen Pelvis Wo  Contrast  06/29/2013   CLINICAL DATA:  Abdominal pain, chronic nausea  EXAM: CT ABDOMEN AND PELVIS WITHOUT CONTRAST  TECHNIQUE: Multidetector CT imaging of the abdomen and pelvis was performed following the standard protocol without intravenous contrast.  COMPARISON:  Prior chest CT 10/14/2012 ; prior CT abdomen/pelvis 02/15/2010.  FINDINGS: Lower Chest: Dependent atelectasis in the  bilateral lower lobes. Trace right pleural effusion. Marked enlargement of the right greater than left heart. Atherosclerotic calcification noted in the mitral annulus. No large pericardial effusion. Unremarkable visualized distal thoracic esophagus.  Abdomen: Unenhanced CT was performed per clinician order. Lack of IV contrast limits sensitivity and specificity, especially for evaluation of abdominal/pelvic solid viscera. Within these limitations, unremarkable CT appearance of the stomach, duodenum, spleen, we and adrenal glands. The multi lobulated relatively low attenuation complex cystic lesion affiliated with the tail of the pancreas demonstrates slow interval enlargement compared to 2007. Currently, the mass measures 4.4 x 4.0 cm in greatest transverse diameter open (image 24, series 2) compared to 3.2 x 3.2 cm. Stable subcentimeter hypoattenuating lesion in hepatic segment 2 most consistent with a small simple cyst. Abnormal nodular hepatic contour again noted. There is trace perihepatic ascites.  Atrophy of the pancreatic head and neck. No hydronephrosis or significant nephrolithiasis.  Although no radiopaque cholelithiasis are seen, there appears to be diffuse moderate gallbladder wall thickening.  All No evidence of obstruction or focal bowel wall thickening. Normal appendix in the right lower quadrant. The terminal ileum is unremarkable.  Pelvis: The bladder is distended with urine. Slightly limited evaluation secondary to streak artifact from right hip prosthesis. Trace free fluid in the cul-de-sac. Surgical changes of prior  hysterectomy.  Bones/Soft Tissues: No acute fracture or aggressive appearing lytic or blastic osseous lesion. L3-L5 posterior lumbar interbody fusion. Advanced left hip osteoarthritis.  Vascular: Extensive atherosclerotic vascular calcifications without aneurysmal dilatation. Limited evaluation in the absence of intravenous contrast.  IMPRESSION: 1. Slow interval enlargement of a complex multiloculated cystic mass affiliated with the tail of the pancreas compared to 2011 as detailed above. Differential considerations include a pancreatic pseudocyst if the patient has a prior history of pancreatitis, and both benign and malignant cystic pancreatic neoplasm. If further imaging is clinically warranted, consider outpatient MRI of the abdomen with and without contrast. 2. Hepatic cirrhosis with small volume ascites. 3. Nonspecific mild gallbladder wall thickening may be related to the patient's underlying hepatic disease, right heart failure or hypoalbuminemia. Alternately, in the appropriate clinical setting acute cholecystitis is a possibility. If further imaging is clinically warranted, consider right upper quadrant ultrasound or nuclear medicine HIDA scan. 4. Extensive atherosclerotic vascular calcifications. 5. Marked right greater than left cardiomegaly. 6. Advanced left hip osteoarthritis. 7. Prior surgical changes of L3-L5 posterior lumbar interbody fusion right total hip arthroplasty. 8. Additional ancillary findings as above.   Electronically Signed   By: Malachy Moan M.D.   On: 06/29/2013 16:37   US Abdomen Limited Ruq  06/29/2013   CLINICAL DATA:  Assess gallbladder.  EXAM: US ABDOMEN LIMITED - RIGHT UPPER QUADRANT  COMPARISON:  CT same day.  FINDINGS: Gallbladder  No definite gallstones. Several small non mobile echogenic foci along the wall of the gallbladder without shadowing which may represent small polyps versus benign cholecystosis. There is minimal gallbladder wall thickening measuring 4.2 mm  which is nonspecific and may be due to patient's known mild ascites. There is a negative sonographic Murphy sign.  Common bile duct  Diameter: 3.8 mm.  Liver:  Suggestion of mild fatty infiltration without focal mass.  IMPRESSION: No evidence of gallstones or acute cholecystitis. Mild gallbladder wall thickening of 4.2 mm which is nonspecific in the setting of mild ascites.  Several tiny echogenic foci along the wall of the gallbladder which may represent small polyps versus benign cholesterolosis.  Fatty infiltration of the liver.   Electronically Signed  By: Elberta Fortis M.D.   On: 06/29/2013 20:29    Problem List  Principal Problem:   Hematochezia Active Problems:   DIABETES MELLITUS, TYPE II   MITRAL VALVE INSUFF&AORTIC VALVE INSUFF   HYPERTENSION   CORONARY ARTERY DISEASE   Atrial fibrillation   Hypokalemia   Normocytic anemia   UTI (lower urinary tract infection)   Assessment: This is a 79 year old, Caucasian female, who presents with sudden worsening nausea and hematochezia since earlier today. She's found to have a urinary tract infection as well. Hematochezia doesn't appear to be significant. Hemoglobin is stable compared to yesterday. Rectal exam revealed brown stool per the ED physician.  Plan: #1 hematochezia: ED physician is contacting gastroenterology for consultation. PT/INR is normal. She's not in any antiplatelet agents. This appears mostly to be a lower GI bleed. We will monitor her on telemetry. She's not actively bleeding at this time. We will give her PPI.  #2 severe hypokalemia: Etiology is unclear. We will replete this aggressively. Especially because she is on digoxin. Magnesium level will be checked as well.  #3 persistent, severe nausea: Etiology remains unclear. No specific biliary disease was noted. Digoxin level will be checked. GI input in this matter will be appreciated. She does have a pancreatic cyst, which could be contributing. Diabetic gastroparesis is  another possibility. However she has been on Reglan and this doesn't seem to have helped. Treat symptomatically.  #4 normocytic anemia: Stable compared to yesterday and when compared to previous values. Continue to monitor every 6 hours for now. Transfuse as needed.  #5 history of atrial fibrillation: Heart rate is reasonably well controlled. She'll be monitored on telemetry. Continue with her beta blocker. Digoxin level will be followed up on. She is no longer on anticoagulation.  #6 history of coronary artery disease with severe mitral regurgitation and congestive heart failure: These issues appear to be stable. Troponin was normal. She denies any chest pain. EKG will be followed up on.  #7 urinary tract infection: She'll be given ceftriaxone. Urine cultures will be followed up on.  #8 diabetes mellitus, type II: Since she'll be kept n.p.o. except for ice chips. We will give her low-dose of Lantus. Sliding scale will be instituted. HbA1c will be checked.  #9 right lower extremity larger than left: The daughter seems to think that this is chronic. But she is not sure. We will proceed with a venous Doppler of that extremity.  #10 complex cyst in the tail of pancreas: This has been present for many years according to the patient and her family. However, has increased according to the latest CT report. GI to address this as well.  #11 Hepatic cirrhosis noted on CT: No prior history of the same. Could be related to CHF. Unclear if she will is a candidate for further evaluation.  DVT Prophylaxis: TED stockings. If no DVT is detected SCDs can be placed Code Status: Discussed with the daughter. Patient is a full code based on previous wishes Family Communication: Discussed with the daughter in detail  Disposition Plan: Admit to telemetry. Dr. Debby Bud will assume care in the morning.   Further management decisions will depend on results of further testing and patient's response to  treatment.  [facility]  [facility] Pager [phone]  If 7PM-7AM, please contact night-coverage [email or web address] Password [facility]  06/30/2013, 4:17 PM

## 2013-06-30 NOTE — ED Notes (Signed)
Critical K+2.7 given to MD

## 2013-06-30 NOTE — Telephone Encounter (Signed)
Phone call from patients son, Brissa Asante [phone] states [address] called the ambulance yesterday due to her severe nausea. The last time she saw Dr Russella Dar he stated (but did not specify) that she is on 3 different meds which side effects are nausea. Also a she had a CT done while at the hospital which showed the lesion on her pancreas has grown. He would like advise as to what to do to get her nausea free? Please advise.

## 2013-06-30 NOTE — ED Notes (Signed)
Per EMS: Pt from [facility].  Pt was here last night for gastric ulcer.  Here today because facility noticed a "bloody turd".  Wanted her evaluated.

## 2013-06-30 NOTE — Progress Notes (Signed)
Utilization Review completed.  Lakita Sahlin RN CM  

## 2013-06-30 NOTE — ED Notes (Signed)
MD at bedside. 

## 2013-06-30 NOTE — ED Provider Notes (Addendum)
CSN: [id number]     Arrival date & time 06/30/13  1338 History   First MD Initiated Contact with Patient 06/30/13 1340     Chief Complaint  Patient presents with  . Rectal Bleeding   (Consider location/radiation/quality/duration/timing/severity/associated sxs/prior Treatment) HPI  Patient presents one day after being evaluated for abdominal pain, nausea, anorexia, now with concern of bloody stool as well. Per report, and per the patient today, [place] prior to ED arrival the patient had an episode of red blood in her stool.  She continues to have ongoing abdominal pain, diffuse, sore, as well as baseline nausea and anorexia. No new episodes of syncope, chest pain, dyspnea either No new edema, fever, chills.    Past Medical History  Diagnosis Date  . Inguinal hernia   . Severe mitral regurgitation     a. 12/2011 Echo: Sev MR with flail leaflet.  . Other and unspecified hyperlipidemia   . Pneumonia   . Osteoarthrosis, unspecified whether generalized or localized, unspecified site   . Unspecified essential hypertension   . Type II or unspecified type diabetes mellitus without mention of complication, not stated as uncontrolled   . Coronary atherosclerosis of unspecified type of vessel, native or graft     a. 11/2007 Cath: 3vd, sev MR ->felt to be poor surgical candidate->Med Rx  . Chronic diastolic CHF (congestive heart failure)     a. 12/2011 Echo: EF 60-65%, No rwma, Mild AI, Sev MR w/ flail segment of ant mv leflet, massive bi-atrial enlargement, Mod TR, PASP 86.  . Atrial fibrillation     a. chronic - no longer on anticoagulation.  Marland Kitchen Unspecified asthma(493.90)   . Anemia, unspecified   . Allergic rhinitis, cause unspecified   . Adenomatous colon polyp 10/2009  . GIB (gastrointestinal bleeding)     2009  . Nephrolithiasis    Past Surgical History  Procedure Laterality Date  . Breast cyst aspiration      x50  . Abdominal hysterectomy  1990  . Lumbar fusion  2007    [facility]   . Total hip arthroplasty  01/2007    Right - Dr Valentina Gu  . Inguinal hernia repair  2010    [facility]osenbower   Family History  Problem Relation Age of Onset  . Heart disease Father     CHF  . Lymphoma Sister   . Colon cancer Neg Hx    History  Substance Use Topics  . Smoking status: Never Smoker   . Smokeless tobacco: Never Used  . Alcohol Use: No   OB History   Grav Para Term Preterm Abortions TAB SAB Ect Mult Living                 Review of Systems  Constitutional:       Per HPI, otherwise negative  HENT:       Per HPI, otherwise negative  Respiratory:       Per HPI, otherwise negative  Cardiovascular:       Per HPI, otherwise negative  Gastrointestinal: Positive for nausea, abdominal pain and blood in stool. Negative for vomiting and diarrhea.  Endocrine:       Negative aside from HPI  Genitourinary:       Neg aside from HPI   Musculoskeletal:       Per HPI, otherwise negative  Skin: Negative.   Neurological: Negative for syncope.    Allergies  Codeine  Home Medications   Current Outpatient Rx  Name  Route  Sig  Dispense  Refill  . acetaminophen (TYLENOL) 325 MG tablet   Oral   Take 650 mg by mouth 3 (three) times daily.         Marland Kitchen amLODipine (NORVASC) 5 MG tablet   Oral   Take 5 mg by mouth daily.         . carvedilol (COREG) 6.25 MG tablet   Oral   Take 6.25 mg by mouth 2 (two) times daily with a meal.         . Cholecalciferol (VITAMIN D3) 1000 UNITS CAPS   Oral   Take 1,000 Units by mouth daily.          . clonazePAM (KLONOPIN) 0.5 MG tablet   Oral   Take 0.5 mg by mouth at bedtime. For anxiety         . digoxin (LANOXIN) 0.125 MG tablet   Oral   Take 0.125 mg by mouth daily.         Marland Kitchen docusate sodium (COLACE) 100 MG capsule   Oral   Take 100 mg by mouth daily.         . Fe Fum-FePoly-FA-Vit C-Vit B3 (INTEGRA F) 125-1 MG CAPS   Oral   Take 1 capsule by mouth daily.         . ferrous sulfate 325 (65 FE) MG tablet   Oral    Take 325 mg by mouth daily with breakfast.         . furosemide (LASIX) 40 MG tablet   Oral   Take 60 mg by mouth 2 (two) times daily.         Marland Kitchen GLUCERNA (GLUCERNA) LIQD   Oral   Take 237 mLs by mouth 3 (three) times daily between meals.         Marland Kitchen HYDROcodone-acetaminophen (NORCO/VICODIN) 5-325 MG per tablet   Oral   Take 1 tablet by mouth every 4 (four) hours as needed for moderate pain.         Marland Kitchen insulin glargine (LANTUS) 100 UNIT/ML injection   Subcutaneous   Inject 55 Units into the skin at bedtime.         . irbesartan (AVAPRO) 150 MG tablet   Oral   Take 150 mg by mouth daily.         Marland Kitchen loratadine (CLARITIN) 10 MG tablet   Oral   Take 10 mg by mouth daily.         . metFORMIN (GLUCOPHAGE) 500 MG tablet   Oral   Take 500 mg by mouth 2 (two) times daily with a meal.         . metoCLOPramide (REGLAN) 5 MG tablet   Oral   Take 5 mg by mouth 3 (three) times daily.         . ondansetron (ZOFRAN) 4 MG tablet   Oral   Take 4 mg by mouth every 8 (eight) hours as needed for nausea or vomiting.         . ONE TOUCH ULTRA TEST test strip      CHECK BLOOD GLUCOSE TWICE A DAY   100 each   3   . pantoprazole (PROTONIX) 40 MG tablet   Oral   Take 40 mg by mouth daily before breakfast.         . potassium chloride 20 MEQ/15ML (10%) solution   Oral   Take 20 mEq by mouth 3 (three) times daily.         . promethazine (PHENERGAN)  12.5 MG tablet   Oral   Take 12.5 mg by mouth every 6 (six) hours as needed for nausea or vomiting.         Marland Kitchen RELION MINI PEN NEEDLES 31G X 6 MM MISC      1 Container.         . senna-docusate (SENOKOT-S) 8.6-50 MG per tablet   Oral   Take 1 tablet by mouth at bedtime.         . temazepam (RESTORIL) 15 MG capsule   Oral   Take 15 mg by mouth at bedtime.         . traZODone (DESYREL) 50 MG tablet   Oral   Take 25 mg by mouth at bedtime.          BP 126/57  Pulse 84  Temp(Src) 98.6 F (37 C) (Oral)   Resp 16  SpO2 94% Physical Exam  Nursing note and vitals reviewed. Constitutional: She is oriented to person, place, and time. She appears well-developed and well-nourished. No distress.  HENT:  Head: Normocephalic and atraumatic.  Eyes: Conjunctivae and EOM are normal.  Cardiovascular: Normal rate and regular rhythm.   Murmur heard. Pulmonary/Chest: Effort normal and breath sounds normal. No stridor. No respiratory distress.  Abdominal:  There is a firm area in the epigastrium, mildly tender to palpation, no peritoneal  Genitourinary: Rectal exam shows external hemorrhoid. Rectal exam shows no fissure, no mass, no tenderness and anal tone normal. Guaiac positive stool.  Brown stool, grossly negative  Musculoskeletal: She exhibits edema. She exhibits no tenderness.  No change from baseline edema  Neurological: She is alert and oriented to person, place, and time. No cranial nerve deficit.  Skin: Skin is warm and dry.  Psychiatric: She has a normal mood and affect.    ED Course  Procedures (including critical care time) Labs Review Labs Reviewed  CBC WITH DIFFERENTIAL - Abnormal; Notable for the following:    WBC 11.7 (*)    RBC 3.69 (*)    Hemoglobin 10.5 (*)    HCT 33.5 (*)    RDW 16.5 (*)    Neutrophils Relative % 86 (*)    Neutro Abs 10.0 (*)    Lymphocytes Relative 4 (*)    Lymphs Abs 0.4 (*)    Monocytes Absolute 1.3 (*)    All other components within normal limits  COMPREHENSIVE METABOLIC PANEL - Abnormal; Notable for the following:    Potassium 2.7 (*)    Chloride 92 (*)    Glucose, Bld 185 (*)    BUN 38 (*)    Alkaline Phosphatase 120 (*)    Total Bilirubin 2.9 (*)    GFR calc non Af Amer 46 (*)    GFR calc Af Amer 53 (*)    All other components within normal limits  URINALYSIS, ROUTINE W REFLEX MICROSCOPIC - Abnormal; Notable for the following:    APPearance CLOUDY (*)    Hgb urine dipstick MODERATE (*)    Leukocytes, UA LARGE (*)    All other components  within normal limits  URINE MICROSCOPIC-ADD ON - Abnormal; Notable for the following:    Squamous Epithelial / LPF FEW (*)    All other components within normal limits  LIPASE, BLOOD  APTT  PROTIME-INR  CG4 I-STAT (LACTIC ACID)  POCT GASTRIC OCCULT BLOOD (1-CARD TO LAB)   Imaging Review Ct Abdomen Pelvis Wo Contrast  06/29/2013   CLINICAL DATA:  Abdominal pain, chronic nausea  EXAM: CT ABDOMEN  AND PELVIS WITHOUT CONTRAST  TECHNIQUE: Multidetector CT imaging of the abdomen and pelvis was performed following the standard protocol without intravenous contrast.  COMPARISON:  Prior chest CT 10/14/2012 ; prior CT abdomen/pelvis 02/15/2010.  FINDINGS: Lower Chest: Dependent atelectasis in the bilateral lower lobes. Trace right pleural effusion. Marked enlargement of the right greater than left heart. Atherosclerotic calcification noted in the mitral annulus. No large pericardial effusion. Unremarkable visualized distal thoracic esophagus.  Abdomen: Unenhanced CT was performed per clinician order. Lack of IV contrast limits sensitivity and specificity, especially for evaluation of abdominal/pelvic solid viscera. Within these limitations, unremarkable CT appearance of the stomach, duodenum, spleen, we and adrenal glands. The multi lobulated relatively low attenuation complex cystic lesion affiliated with the tail of the pancreas demonstrates slow interval enlargement compared to 2007. Currently, the mass measures 4.4 x 4.0 cm in greatest transverse diameter open (image 24, series 2) compared to 3.2 x 3.2 cm. Stable subcentimeter hypoattenuating lesion in hepatic segment 2 most consistent with a small simple cyst. Abnormal nodular hepatic contour again noted. There is trace perihepatic ascites.  Atrophy of the pancreatic head and neck. No hydronephrosis or significant nephrolithiasis.  Although no radiopaque cholelithiasis are seen, there appears to be diffuse moderate gallbladder wall thickening.  All No evidence  of obstruction or focal bowel wall thickening. Normal appendix in the right lower quadrant. The terminal ileum is unremarkable.  Pelvis: The bladder is distended with urine. Slightly limited evaluation secondary to streak artifact from right hip prosthesis. Trace free fluid in the cul-de-sac. Surgical changes of prior hysterectomy.  Bones/Soft Tissues: No acute fracture or aggressive appearing lytic or blastic osseous lesion. L3-L5 posterior lumbar interbody fusion. Advanced left hip osteoarthritis.  Vascular: Extensive atherosclerotic vascular calcifications without aneurysmal dilatation. Limited evaluation in the absence of intravenous contrast.  IMPRESSION: 1. Slow interval enlargement of a complex multiloculated cystic mass affiliated with the tail of the pancreas compared to 2011 as detailed above. Differential considerations include a pancreatic pseudocyst if the patient has a prior history of pancreatitis, and both benign and malignant cystic pancreatic neoplasm. If further imaging is clinically warranted, consider outpatient MRI of the abdomen with and without contrast. 2. Hepatic cirrhosis with small volume ascites. 3. Nonspecific mild gallbladder wall thickening may be related to the patient's underlying hepatic disease, right heart failure or hypoalbuminemia. Alternately, in the appropriate clinical setting acute cholecystitis is a possibility. If further imaging is clinically warranted, consider right upper quadrant ultrasound or nuclear medicine HIDA scan. 4. Extensive atherosclerotic vascular calcifications. 5. Marked right greater than left cardiomegaly. 6. Advanced left hip osteoarthritis. 7. Prior surgical changes of L3-L5 posterior lumbar interbody fusion right total hip arthroplasty. 8. Additional ancillary findings as above.   Electronically Signed   By: Malachy Moan M.D.   On: 06/29/2013 16:37   US Abdomen Limited Ruq  06/29/2013   CLINICAL DATA:  Assess gallbladder.  EXAM: US ABDOMEN  LIMITED - RIGHT UPPER QUADRANT  COMPARISON:  CT same day.  FINDINGS: Gallbladder  No definite gallstones. Several small non mobile echogenic foci along the wall of the gallbladder without shadowing which may represent small polyps versus benign cholecystosis. There is minimal gallbladder wall thickening measuring 4.2 mm which is nonspecific and may be due to patient's known mild ascites. There is a negative sonographic Murphy sign.  Common bile duct  Diameter: 3.8 mm.  Liver:  Suggestion of mild fatty infiltration without focal mass.  IMPRESSION: No evidence of gallstones or acute cholecystitis. Mild gallbladder wall thickening  of 4.2 mm which is nonspecific in the setting of mild ascites.  Several tiny echogenic foci along the wall of the gallbladder which may represent small polyps versus benign cholesterolosis.  Fatty infiltration of the liver.   Electronically Signed   By: Elberta Fortis M.D.   On: 06/29/2013 20:29    EKG Interpretation   None      After the initial evaluation I reviewed the patient's chart including yesterday's visit, radiographic studies from yesterday.  Update: On repeat exam the patient appears comfortable.  Labs notable for hypokalemia.  Patient will receive supplements.  Labs also notable for mild leukocytosis, mild leukocytes in the urine.  I attempted to contact the patient's GI team ([place]) and they will assist with the patient's care.  MDM   1. Rectal bleeding   2. Hypokalemia    This patient presents for second time in 2 days.  Today, she now complains of rectal bleeding in addition to the ongoing abdominal pain, weakness.  Review of patient's chart demonstrates the presence of a well documented pancreatic mass, imaged yesterday.  The patient has mild discomfort in this area, and no peritoneal findings.  She has previously been evaluated for this and has not been deemed a candidate for further E/M, such as biopsy - given her age and co morbidities.  The  patient was hemodynamically stable throughout her ED stay.   With the patient's new active bleeding, no hypokalemia, she was admitted for further evaluation and management.    Gerhard Munch, MD 06/30/13 1545  Gerhard Munch, MD 06/30/13 [phone]

## 2013-06-30 NOTE — ED Notes (Signed)
Pts daughter is caring for pt and would like to be notified if she is not at bedside. Home # [phone] and cell# [phone]. Name is [name]

## 2013-06-30 NOTE — Progress Notes (Signed)
Patient was agitated and was moving up and down the bed from a lying position to a sitting position. Vitals:32F,HR 68, RR 24, B/P 131/44, 98%  Oxygen sats on 2 L  [place].  MD on call was notified.

## 2013-06-30 NOTE — ED Notes (Signed)
Bed: VW09 Expected date:  Expected time:  Means of arrival:  Comments: EMS-blood in stool

## 2013-07-01 DIAGNOSIS — I4891 Unspecified atrial fibrillation: Secondary | ICD-10-CM

## 2013-07-01 DIAGNOSIS — K625 Hemorrhage of anus and rectum: Secondary | ICD-10-CM

## 2013-07-01 DIAGNOSIS — I080 Rheumatic disorders of both mitral and aortic valves: Secondary | ICD-10-CM

## 2013-07-01 DIAGNOSIS — R7309 Other abnormal glucose: Secondary | ICD-10-CM

## 2013-07-01 DIAGNOSIS — R110 Nausea: Principal | ICD-10-CM

## 2013-07-01 DIAGNOSIS — M7989 Other specified soft tissue disorders: Secondary | ICD-10-CM

## 2013-07-01 DIAGNOSIS — E119 Type 2 diabetes mellitus without complications: Secondary | ICD-10-CM

## 2013-07-01 DIAGNOSIS — N390 Urinary tract infection, site not specified: Secondary | ICD-10-CM

## 2013-07-01 DIAGNOSIS — D469 Myelodysplastic syndrome, unspecified: Secondary | ICD-10-CM

## 2013-07-01 LAB — COMPREHENSIVE METABOLIC PANEL
ALT: 17 U/L (ref 0–35)
Albumin: 3.2 g/dL — ABNORMAL LOW (ref 3.5–5.2)
BUN: 39 mg/dL — ABNORMAL HIGH (ref 6–23)
CO2: 30 mEq/L (ref 19–32)
Calcium: 9.5 mg/dL (ref 8.4–10.5)
Chloride: 96 mEq/L (ref 96–112)
Creatinine, Ser: 1.27 mg/dL — ABNORMAL HIGH (ref 0.50–1.10)
GFR calc Af Amer: 41 mL/min — ABNORMAL LOW (ref >90)
GFR calc non Af Amer: 36 mL/min — ABNORMAL LOW (ref >90)
Glucose, Bld: 80 mg/dL (ref 70–99)
Total Bilirubin: 2.5 mg/dL — ABNORMAL HIGH (ref 0.3–1.2)
Total Protein: 6.9 g/dL (ref 6.0–8.3)

## 2013-07-01 LAB — CBC
MCH: 28.5 pg (ref 26.0–34.0)
MCHC: 31.3 g/dL (ref 30.0–36.0)
MCV: 90.9 fL (ref 78.0–100.0)
Platelets: 143 10*3/uL — ABNORMAL LOW (ref 150–400)
RBC: 3.51 MIL/uL — ABNORMAL LOW (ref 3.87–5.11)
RDW: 16.7 % — ABNORMAL HIGH (ref 11.5–15.5)

## 2013-07-01 LAB — HEMOGLOBIN A1C: Hgb A1c MFr Bld: 7.2 % — ABNORMAL HIGH (ref <5.7)

## 2013-07-01 LAB — TROPONIN I
Troponin I: <0.3 ng/mL (ref <0.30)
Troponin I: <0.3 ng/mL (ref <0.30)

## 2013-07-01 LAB — GLUCOSE, CAPILLARY
Glucose-Capillary: 81 mg/dL (ref 70–99)
Glucose-Capillary: 120 mg/dL — ABNORMAL HIGH (ref 70–99)
Glucose-Capillary: 64 mg/dL — ABNORMAL LOW (ref 70–99)
Glucose-Capillary: 143 mg/dL — ABNORMAL HIGH (ref 70–99)

## 2013-07-01 MED ORDER — DEXTROSE 50 % IV SOLN
INTRAVENOUS | Status: AC
  Administered 2013-07-01: 25 mL via INTRAVENOUS
  Filled 2013-07-01: qty 50

## 2013-07-01 MED ORDER — POLYETHYLENE GLYCOL 3350 17 G PO PACK
17.0000 g | PACK | Freq: Every day | ORAL | Status: DC
  Administered 2013-07-01 – 2013-07-04 (×3): 17 g via ORAL
  Filled 2013-07-01 (×6): qty 1

## 2013-07-01 MED ORDER — PANTOPRAZOLE SODIUM 40 MG PO TBEC
40.0000 mg | DELAYED_RELEASE_TABLET | Freq: Every day | ORAL | Status: DC
  Administered 2013-07-01 – 2013-07-07 (×7): 40 mg via ORAL
  Filled 2013-07-01 (×10): qty 1

## 2013-07-01 MED ORDER — INSULIN ASPART 100 UNIT/ML ~~LOC~~ SOLN
0.0000 [IU] | Freq: Four times a day (QID) | SUBCUTANEOUS | Status: DC
  Administered 2013-07-02 (×2): 1 [IU] via SUBCUTANEOUS
  Administered 2013-07-03: 14:00:00 2 [IU] via SUBCUTANEOUS

## 2013-07-01 MED ORDER — FUROSEMIDE 10 MG/ML IJ SOLN
40.0000 mg | Freq: Four times a day (QID) | INTRAMUSCULAR | Status: AC
  Administered 2013-07-01 – 2013-07-02 (×3): 40 mg via INTRAVENOUS
  Filled 2013-07-01 (×3): qty 4

## 2013-07-01 MED ORDER — BISACODYL 10 MG RE SUPP: 10.0000 mg | Freq: Every day | RECTAL | Status: DC | PRN

## 2013-07-01 MED ORDER — SODIUM CHLORIDE 0.9 % IV SOLN
INTRAVENOUS | Status: DC
  Administered 2013-07-01: 10:00:00 via INTRAVENOUS

## 2013-07-01 MED ORDER — DEXTROSE 50 % IV SOLN
25.0000 mL | Freq: Once | INTRAVENOUS | Status: AC | PRN
  Administered 2013-07-01: 25 mL via INTRAVENOUS

## 2013-07-01 NOTE — Progress Notes (Signed)
OT Cancellation Note  Patient Details Name: [name] MRN: [id number] DOB: 03/07/1920   Cancelled Treatment:    Reason Eval/Treat Not Completed: Medical issues which prohibited therapy Reason Eval/Treat Not Completed: Medical issues which prohibited therapy R LE doppler ordered. Will await results   [name], [place] 07/01/2013, 3:15 PM

## 2013-07-01 NOTE — Consult Note (Addendum)
[facility] Consult: 8:58 AM 07/01/2013  LOS: 1 day    Referring Provider: Debby Bud Primary Care Physician:  Illene Regulus, MD Primary Gastroenterologist:  Dr. Russella Dar  Reason for Consultation:  Nausea and bleeding per rectum   HPI: [name] is a 79 y.o. female.  Has severe mitral regurgitation, 3 vessel coronary artery disease, on medical management, hypertension, diabetes, history of atrial fibrillation, not on anticoagulation, history of anemia.  Hx of ascites, stable pericholecystic fluid, stable pancreatic tail cystic lesions 9back as far as 2007).  CT angio of abdomen in 2011 with congestive hepatopathy, mesenteric stenosis without obstruction, stable pancreatic cyst lesions. Has not had HIDA scan.   Has had persistent nausea for many months.  Evaluated in 02/2013 by Dr Russella Dar for nausea, primarily morning occurrences.  Dr Russella Dar suspected this was s/e of hydrocodone she uses. Also contributing to nausea and constipation were po Iron. He suggested continuing taking po Phenergen with her evening vicodin and switching iron to midday dosing.  Consideration for EGD if sxs persisted or worsened. Sugars running into 400s in Sept 2014.  She went to [facility].  meds had been promethazine and reglan, but phenergen substituted with Zofran mid October 2014.  Reports of trouble swallowing large potassium pills  Pt had outpt CT abdomen on 11/20 (see below). Pt developed 2 to 3 episodes of bleeding per rectum at assisted living yesterday. She was admittted last night.  Hgb of 10.0, c/w 8.0 to 12 over last several months. Platelets mildly reduced. U/A shows WBCs but rare bacteria   She can not tell me date of last BM, but did not have BM yesterday and tends to constipation.  Denies abdominal pain.  Nausea still  mostly in AM, resolves by later AM and for rest of day.  She says she doesn't vomit.  Denies dyspnea.  No extremity edema.  No dysuria. Does c/o left hip and leg pain.   This is controlled with her narcotics and is currently not controlled, as no narcotics on inpt medlist.       Past Medical History  Diagnosis Date  . Inguinal hernia   . Severe mitral regurgitation     a. 12/2011 Echo: Sev MR with flail leaflet.  . Other and unspecified hyperlipidemia   . Pneumonia   . Osteoarthrosis, unspecified whether generalized or localized, unspecified site   . Unspecified essential hypertension   . Type II or unspecified type diabetes mellitus without mention of complication, not stated as uncontrolled   . Coronary atherosclerosis of unspecified type of vessel, native or graft     a. 11/2007 Cath: 3vd, sev MR ->felt to be poor surgical candidate->Med Rx  . Chronic diastolic CHF (congestive heart failure)     a. 12/2011 Echo: EF 60-65%, No rwma, Mild AI, Sev MR w/ flail segment of ant mv leflet, massive bi-atrial enlargement, Mod TR, PASP 86.  . Atrial fibrillation     a. chronic - no longer on anticoagulation.  Marland Kitchen Unspecified asthma(493.90)   . Anemia, unspecified   . Allergic rhinitis, cause  unspecified   . Adenomatous colon polyp 10/2009  . GIB (gastrointestinal bleeding)     2009  . Nephrolithiasis     Past Surgical History  Procedure Laterality Date  . Breast cyst aspiration      x50  . Abdominal hysterectomy  1990  . Lumbar fusion  2007    [facility]  . Total hip arthroplasty  01/2007    Right - Dr Valentina Gu  . Inguinal hernia repair  2010    [facility]    Prior to Admission medications   Medication Sig Start Date End Date Taking? Authorizing Provider  acetaminophen (TYLENOL) 325 MG tablet Take 650 mg by mouth 3 (three) times daily.   Yes Historical Provider, MD  amLODipine (NORVASC) 5 MG tablet Take 5 mg by mouth daily.   Yes Historical Provider, MD  carvedilol (COREG) 6.25 MG tablet  Take 6.25 mg by mouth 2 (two) times daily with a meal.   Yes Historical Provider, MD  Cholecalciferol (VITAMIN D3) 1000 UNITS CAPS Take 1,000 Units by mouth daily.    Yes Historical Provider, MD  clonazePAM (KLONOPIN) 0.5 MG tablet Take 0.5 mg by mouth at bedtime. For anxiety   Yes Historical Provider, MD  digoxin (LANOXIN) 0.125 MG tablet Take 0.125 mg by mouth daily.   Yes Historical Provider, MD  docusate sodium (COLACE) 100 MG capsule Take 100 mg by mouth daily.   Yes Historical Provider, MD  Fe Fum-FePoly-FA-Vit C-Vit B3 (INTEGRA F) 125-1 MG CAPS Take 1 capsule by mouth daily.   Yes Historical Provider, MD  ferrous sulfate 325 (65 FE) MG tablet Take 325 mg by mouth daily with breakfast.   Yes Historical Provider, MD  furosemide (LASIX) 40 MG tablet Take 60 mg by mouth 2 (two) times daily.   Yes Historical Provider, MD  GLUCERNA (GLUCERNA) LIQD Take 237 mLs by mouth 3 (three) times daily between meals.   Yes Historical Provider, MD  HYDROcodone-acetaminophen (NORCO/VICODIN) 5-325 MG per tablet Take 1 tablet by mouth every 4 (four) hours as needed for moderate pain.   Yes Historical Provider, MD  insulin glargine (LANTUS) 100 UNIT/ML injection Inject 55 Units into the skin at bedtime.   Yes Historical Provider, MD  irbesartan (AVAPRO) 150 MG tablet Take 150 mg by mouth daily.   Yes Historical Provider, MD  loratadine (CLARITIN) 10 MG tablet Take 10 mg by mouth daily.   Yes Historical Provider, MD  metFORMIN (GLUCOPHAGE) 500 MG tablet Take 500 mg by mouth 2 (two) times daily with a meal.   Yes Historical Provider, MD  metoCLOPramide (REGLAN) 5 MG tablet Take 5 mg by mouth 3 (three) times daily.   Yes Historical Provider, MD  ondansetron (ZOFRAN) 4 MG tablet Take 4 mg by mouth every 8 (eight) hours as needed for nausea or vomiting.   Yes Historical Provider, MD  ONE TOUCH ULTRA TEST test strip CHECK BLOOD GLUCOSE TWICE A DAY 05/04/13  Yes Jacques Navy, MD  pantoprazole (PROTONIX) 40 MG tablet  Take 40 mg by mouth daily before breakfast. 03/10/13  Yes Jacques Navy, MD  potassium chloride 20 MEQ/15ML (10%) solution Take 20 mEq by mouth 3 (three) times daily.   Yes Historical Provider, MD  promethazine (PHENERGAN) 12.5 MG tablet Take 12.5 mg by mouth every 6 (six) hours as needed for nausea or vomiting.   Yes Historical Provider, MD  RELION MINI PEN NEEDLES 31G X 6 MM MISC 1 Container. 02/25/12  Yes Historical Provider, MD  senna-docusate (SENOKOT-S) 8.6-50 MG  per tablet Take 1 tablet by mouth at bedtime.   Yes Historical Provider, MD  temazepam (RESTORIL) 15 MG capsule Take 15 mg by mouth at bedtime.   Yes Historical Provider, MD  traZODone (DESYREL) 50 MG tablet Take 25 mg by mouth at bedtime.   Yes Historical Provider, MD    Scheduled Meds: . carvedilol  6.25 mg Oral BID WC  . cefTRIAXone (ROCEPHIN)  IV  1 g Intravenous Q24H  . clonazePAM  0.5 mg Oral QHS  . clonazePAM  0.5 mg Oral QHS  . digoxin  0.125 mg Oral Daily  . furosemide  40 mg Oral BID  . insulin aspart  0-9 Units Subcutaneous Q4H  . insulin glargine  15 Units Subcutaneous QHS  . pantoprazole (PROTONIX) IV  40 mg Intravenous Q12H  . sodium chloride  3 mL Intravenous Q12H   Infusions:   PRN Meds: acetaminophen, acetaminophen, ondansetron (ZOFRAN) IV, ondansetron   Allergies as of 06/30/2013 - Review Complete 06/30/2013  Allergen Reaction Noted  . Codeine      Family History  Problem Relation Age of Onset  . Heart disease Father     CHF  . Lymphoma Sister   . Colon cancer Neg Hx     History   Social History  . Marital Status: Widowed    Spouse Name: N/A    Number of Children: N/A  . Years of Education: N/A   Occupational History  . Not on file.   Social History Main Topics  . Smoking status: Never Smoker   . Smokeless tobacco: Never Used  . Alcohol Use: No  . Drug Use: No  . Sexual Activity: No   Other Topics Concern  . Not on file   Social History Narrative   Married '42 - 38 years -  Widowed   1 son - '50, 1 dtr - '46   4 grandchildren   Regular Exercise -  NO      Lives in [place].      End of life issues: no prolonged mechanical vent. Suport. Laymen's guide to death with dignity provided with a suggestion that she discuss these issues with her family. Her son was supportive of having this discussion.    REVIEW OF SYSTEMS: 12 system review per HPI   PHYSICAL EXAM: Vital signs in last 24 hours: Filed Vitals:   07/01/13 0441  BP: 135/62  Pulse: 63  Temp: 97.4 F (36.3 C)  Resp: 22   Wt Readings from Last 3 Encounters:  06/30/13 60.9 kg (134 lb 4.2 oz)  05/08/13 57.97 kg (127 lb 12.8 oz)  04/12/13 54.341 kg (119 lb 12.8 oz)    General: frail, aged WF who is in pain with hip pain Head:  No swelling  Eyes:  No icterus or pallor Ears:  Not HOH  Nose:  Some nasal drip Mouth:  Geographic tongue appearance, moist MM Neck:  No mass, no JVD Lungs:  Crackles in bases, no dyspnea Heart: Harsh, loud systolic murmer, regualar rythm Abdomen:  Soft, NT, ND, firm ridge in RUQ is not tender ? Is this collection of stool.   Rectal: no stool impaction.scant specimen is greenish brown and FOB negativwe   Musc/Skeltl: no joint swelling Extremities:  No pedal edema  Neurologic:  Oriented to self and place but not to date.  Poor recall of any relevant recent medical occurrences. Skin:  No rash Tattoos:  On lower eye lid.  Nodes:  No cervical or inguinal adenopathy   Psych:  Distressed, agitated, moaning at times  Intake/Output from previous day: 11/21 0701 - 11/22 0700 In: 625 [IV Piggyback:625] Out: 775 [Urine:775] Intake/Output this shift:    LAB RESULTS:  Recent Labs  06/30/13 1834 06/30/13 2247 07/01/13 0440  WBC 10.8* 10.1 8.7  HGB 10.1* 10.1* 10.0*  HCT 32.2* 31.8* 31.9*  PLT 152 149* 143*   BMET Lab Results  Component Value Date   NA 138 07/01/2013   NA 135 06/30/2013   NA 137 06/30/2013   K 3.1* 07/01/2013   K 2.9* 06/30/2013   K 2.7*  06/30/2013   CL 96 07/01/2013   CL 92* 06/30/2013   CL 92* 06/30/2013   CO2 30 07/01/2013   CO2 30 06/30/2013   CO2 31 06/30/2013   GLUCOSE 80 07/01/2013   GLUCOSE 143* 06/30/2013   GLUCOSE 185* 06/30/2013   BUN 39* 07/01/2013   BUN 36* 06/30/2013   BUN 38* 06/30/2013   CREATININE 1.27* 07/01/2013   CREATININE 1.15* 06/30/2013   CREATININE 1.04 06/30/2013   CALCIUM 9.5 07/01/2013   CALCIUM 9.4 06/30/2013   CALCIUM 10.1 06/30/2013   LFT  Recent Labs  06/29/13 1431 06/30/13 1403 07/01/13 0440  PROT 7.9 7.6 6.9  ALBUMIN 3.7 3.6 3.2*  AST 26 30 22   ALT 21 20 17   ALKPHOS 136* 120* 100  BILITOT 1.4* 2.9* 2.5*   PT/INR Lab Results  Component Value Date   INR 1.20 06/30/2013   INR 1.03 03/28/2012   INR 1.14 10/09/2009   Hepatitis Panel No results found for this basename: HEPBSAG, HCVAB, HEPAIGM, HEPBIGM,  in the last 72 hours C-Diff No components found with this basename: cdiff   Lipase     Component Value Date/Time   LIPASE 16 06/30/2013 1403     RADIOLOGY STUDIES: Ct Abdomen Pelvis Wo Contrast 06/29/2013   CLINICAL DATA:  Abdominal pain, chronic nausea  EXAM: CT ABDOMEN AND PELVIS WITHOUT CONTRAST  TECHNIQUE: Multidetector CT imaging of the abdomen and pelvis was performed following the standard protocol without intravenous contrast.  COMPARISON:  Prior chest CT 10/14/2012 ; prior CT abdomen/pelvis 02/15/2010.  FINDINGS: Lower Chest: Dependent atelectasis in the bilateral lower lobes. Trace right pleural effusion. Marked enlargement of the right greater than left heart. Atherosclerotic calcification noted in the mitral annulus. No large pericardial effusion. Unremarkable visualized distal thoracic esophagus.  Abdomen: Unenhanced CT was performed per clinician order. Lack of IV contrast limits sensitivity and specificity, especially for evaluation of abdominal/pelvic solid viscera. Within these limitations, unremarkable CT appearance of the stomach, duodenum, spleen, we and  adrenal glands. The multi lobulated relatively low attenuation complex cystic lesion affiliated with the tail of the pancreas demonstrates slow interval enlargement compared to 2007. Currently, the mass measures 4.4 x 4.0 cm in greatest transverse diameter open (image 24, series 2) compared to 3.2 x 3.2 cm. Stable subcentimeter hypoattenuating lesion in hepatic segment 2 most consistent with a small simple cyst. Abnormal nodular hepatic contour again noted. There is trace perihepatic ascites.  Atrophy of the pancreatic head and neck. No hydronephrosis or significant nephrolithiasis.  Although no radiopaque cholelithiasis are seen, there appears to be diffuse moderate gallbladder wall thickening.  All No evidence of obstruction or focal bowel wall thickening. Normal appendix in the right lower quadrant. The terminal ileum is unremarkable.  Pelvis: The bladder is distended with urine. Slightly limited evaluation secondary to streak artifact from right hip prosthesis. Trace free fluid in the cul-de-sac. Surgical changes of prior hysterectomy.  Bones/Soft Tissues: No acute  fracture or aggressive appearing lytic or blastic osseous lesion. L3-L5 posterior lumbar interbody fusion. Advanced left hip osteoarthritis.  Vascular: Extensive atherosclerotic vascular calcifications without aneurysmal dilatation. Limited evaluation in the absence of intravenous contrast.  IMPRESSION: 1. Slow interval enlargement of a complex multiloculated cystic mass affiliated with the tail of the pancreas compared to 2011 as detailed above. Differential considerations include a pancreatic pseudocyst if the patient has a prior history of pancreatitis, and both benign and malignant cystic pancreatic neoplasm. If further imaging is clinically warranted, consider outpatient MRI of the abdomen with and without contrast. 2. Hepatic cirrhosis with small volume ascites. 3. Nonspecific mild gallbladder wall thickening may be related to the patient's  underlying hepatic disease, right heart failure or hypoalbuminemia. Alternately, in the appropriate clinical setting acute cholecystitis is a possibility. If further imaging is clinically warranted, consider right upper quadrant ultrasound or nuclear medicine HIDA scan. 4. Extensive atherosclerotic vascular calcifications. 5. Marked right greater than left cardiomegaly. 6. Advanced left hip osteoarthritis. 7. Prior surgical changes of L3-L5 posterior lumbar interbody fusion right total hip arthroplasty. 8. Additional ancillary findings as above.   Electronically Signed   By: Malachy Moan M.D.   On: 06/29/2013 16:37   Portable Chest 1 View 06/30/2013   CLINICAL DATA:  Bleeding from rectum today, shortness of breath, weakness, fatigue, cough, hypertension, diabetes  EXAM: PORTABLE CHEST - 1 VIEW  COMPARISON:  Portable exam 1707 hr compared to 10/17/2012  FINDINGS: Enlargement of cardiac silhouette with pulmonary vascular congestion.  Atherosclerotic calcification of a and elongated thoracic aorta.  Interstitial infiltrates the perihilar regions likely representing pulmonary edema and CHF.  No gross pleural effusion or pneumothorax.  Bones demineralized.  IMPRESSION: Enlargement of cardiac silhouette with pulmonary vascular congestion and probable mild pulmonary edema/CHF.   Electronically Signed   By: Ulyses Southward M.D.   On: 06/30/2013 17:13   US Abdomen Limited Ruq 06/29/2013   CLINICAL DATA:  Assess gallbladder.  EXAM: US ABDOMEN LIMITED - RIGHT UPPER QUADRANT  COMPARISON:  CT same day.  FINDINGS: Gallbladder  No definite gallstones. Several small non mobile echogenic foci along the wall of the gallbladder without shadowing which may represent small polyps versus benign cholecystosis. There is minimal gallbladder wall thickening measuring 4.2 mm which is nonspecific and may be due to patient's known mild ascites. There is a negative sonographic Murphy sign.  Common bile duct  Diameter: 3.8 mm.  Liver:   Suggestion of mild fatty infiltration without focal mass.  IMPRESSION: No evidence of gallstones or acute cholecystitis. Mild gallbladder wall thickening of 4.2 mm which is nonspecific in the setting of mild ascites.  Several tiny echogenic foci along the wall of the gallbladder which may represent small polyps versus benign cholesterolosis.  Fatty infiltration of the liver.   Electronically Signed   By: Elberta Fortis M.D.   On: 06/29/2013 20:29    ENDOSCOPIC STUDIES: Colonoscopy 10/2009 ENDOSCOPIC IMPRESSION:  1) 4 - 5 mm Two polyps in the mid transverse colon  2) 10 mm pedunculated polyp in the sigmoid colon  3) Internal hemorrhoids  RECOMMENDATIONS:  1) No aspirin or NSAID's for 2 weeks  2) Await pathology results  3) No plans for future surveillance colonoscopies due to age and  comorbidities Pathology 1. COLON, POLYP(S), 2 SMALL TRANSVERSE; 1 SIGMOID : - ONE LARGE TUBULAR ADENOMA(1.0 CM). - two smaller tubular adenomas. - no high grade dysplasia or malignancy identified.   IMPRESSION:   *  Rectal bleeding, minor.  Now FOB  negative.  Wonder if constipation contributing.   *  Nausea. Chronic. Minor, now resolved elevation of Alk phos, rising bilirubin, normal Lipase.  Cystic lesion of pancreas, cirrhosis with minor ascites, ? GB polyps vs cholesterolosis on imaging.  , .   *  Chronic, stable doses of  Narcotics for arthritis pain.   *  Hypokalemia  *  Hx adenomatous colon polyps 2011  *  CHF, mild by CXR  *  Severe mitral regurge, a fib, 3 vsl CAD.  No anticoagulation  *  IDDM.  Not recently well controlled.       PLAN:     *  Per Dr Christella Hartigan:  EGD tomorrow.  *  Will add Miralax, bisacodyl.  Her vicodin needs to be reatrted, do not think she can go without measuring by this am's complaints of pain.   Jennye Moccasin  07/01/2013, 8:58 AM Pager: [phone]    ________________________________________________________________________  [facility] MD note:  I  personally examined the patient, reviewed the data and agree with the assessment and plan described above.  Planning on EGD tomorrow for worsening nausea despite conservative treatments.  Perhaps H. Pylori, PUD, doubt malignancy.  I explained risks, benefits, alternatives to patient and her daughter and they wish to proceed.  Her pancreatic cystic lesion has been present for at least 7 years, only minimal growth.  On CT no overt suggestion of malignancy.  I suggest that this does not require further surveillance given overall clinical situation.     Rob Bunting, MD [facility] Pager [phone]

## 2013-07-01 NOTE — Progress Notes (Signed)
Subjective: [name] is followed for MDS with chronic anemia with h/o exacerbations with GI bleed, diabetes that has been followed with permissive guidelines to avoid hypoglycemic episodes, severe valvular disease and CAD - not a candidate for intervention and chronic nausea for which she has been on Reglan. She has been admitted for progressive Nausea and for mild hematochezia. She was seen in the ED 11/20 , received IV fluids and was discharged home. Studies did reveal enlarging chronic pancreatic cyst. She returned and was admitted for hematochezia. GI has seen her and the plan is for EGD. She has been hemodynamically stable and her Hgb has also been stable. .  [name] is awake but she appears ill, she is not wearing her make-up or wig. She complains of being short of breath but denies any chest pain.   Objective: Lab:  Recent Labs  06/29/13 1431 06/30/13 1403 06/30/13 1834 06/30/13 2247 07/01/13 0440  WBC 5.6 11.7* 10.8* 10.1 8.7  NEUTROABS 4.3 10.0*  --   --   --   HGB 10.5* 10.5* 10.1* 10.1* 10.0*  HCT 33.3* 33.5* 32.2* 31.8* 31.9*  MCV 91.5 90.8 91.0 90.6 90.9  PLT 170 178 152 149* 143*    Recent Labs  06/30/13 1403 06/30/13 1833 06/30/13 2247 07/01/13 0440  NA 137  --  135 138  K 2.7*  --  2.9* 3.1*  CL 92*  --  92* 96  GLUCOSE 185*  --  143* 80  BUN 38*  --  36* 39*  CREATININE 1.04  --  1.15* 1.27*  CALCIUM 10.1  --  9.4 9.5  MG  --  1.6  --   --     Imaging: 06/30/13 CXR: IMPRESSION:  Enlargement of cardiac silhouette with pulmonary vascular congestion  and probable mild pulmonary edema/CHF.  Scheduled Meds: . carvedilol  6.25 mg Oral BID WC  . cefTRIAXone (ROCEPHIN)  IV  1 g Intravenous Q24H  . clonazePAM  0.5 mg Oral QHS  . clonazePAM  0.5 mg Oral QHS  . digoxin  0.125 mg Oral Daily  . furosemide  40 mg Oral BID  . insulin aspart  0-9 Units Subcutaneous Q4H  . insulin glargine  15 Units Subcutaneous QHS  . pantoprazole  40 mg Oral Q0600  .  polyethylene glycol  17 g Oral Daily  . sodium chloride  3 mL Intravenous Q12H   Continuous Infusions: . sodium chloride 20 mL/hr at 07/01/13 1022   PRN Meds:.acetaminophen, acetaminophen, bisacodyl, ondansetron (ZOFRAN) IV, ondansetron   Physical Exam: Filed Vitals:   07/01/13 0958  BP:   Pulse: 91  Temp:   Resp:     Intake/Output Summary (Last 24 hours) at 07/01/13 1156 Last data filed at 07/01/13 0100  Gross per 24 hour  Intake    625 ml  Output    775 ml  Net   -150 ml   Gen'l - very elderly ill appearing woman who is working hard to breath HEENT- C&S clear Cor 2+ radial, Regular tachycardia, III/VI cardiac mm best at apex, no JVD Pulm - difficult to auscultate 2/2 loud mm - shallow inspirations with decreased air movement, no rales. Abd - BS+, protruberant, no guarding or rebound, not tender Neuro - withdrawn but awake and alert.      Assessment/Plan: 1. GI - chronic nausea with known gastroparesis treated with reglan. This has become much worse over the past week - unable to eat. No hematochezia and does not complain of pain. Appreciate  GI consult Plan PPI therapy  For EGD in AM  2. Cardaic - multiple severe problems: flail leaflet mitral valve, diastolic dysfunction, A. Fib, severe CAD. She has pul edema by chest x-ray along with increased WOB. Last echo March '14 Plan Change lasix to IV  2 D echo  BNP  3. Heme - h/o of MDS. Her Hgb is very stable for her and there was little bleeding. Per [name] stool was heme negative.  Plan D/c q 6 H/H  4. ID - mildly positive U/A - normal WBC. Day # 2 IV rocephin   Code Status - full code.    Illene Regulus [facility] (o) [phone]; (c) [phone] Call-grp - [name]  Tele: [phone]  07/01/2013, 11:36 AM

## 2013-07-01 NOTE — Progress Notes (Signed)
Hypoglycemic Event  CBG: 64  Treatment: D50 IV 25 mL  Symptoms: None  Follow-up CBG: [id number] CBG Result:120  Possible Reasons for Event: Inadequate meal intake  Comments/MD notified:hypoglycemia protocol     [name]  Remember to initiate Hypoglycemia Order Set & complete

## 2013-07-01 NOTE — Progress Notes (Signed)
PT Cancellation Note  Patient Details Name: [name] MRN: [id number] DOB: October 13, 1919   Cancelled Treatment:    Reason Eval/Treat Not Completed: Medical issues which prohibited therapy R LE doppler ordered.  Will await results.   Easter Schinke,KATHrine E 07/01/2013, 3:09 PM Zenovia Jarred, PT, DPT 07/01/2013 Pager: [phone]

## 2013-07-01 NOTE — Progress Notes (Signed)
VASCULAR LAB PRELIMINARY  PRELIMINARY  PRELIMINARY  PRELIMINARY  Right lower extremity venous Doppler completed.    Preliminary report:  There is no DVT or SVT noted in the right lower extremity.  Shaundra Fullam, RVT 07/01/2013, 5:16 PM

## 2013-07-02 ENCOUNTER — Encounter (HOSPITAL_COMMUNITY): Payer: Self-pay | Admitting: Gastroenterology

## 2013-07-02 ENCOUNTER — Encounter (HOSPITAL_COMMUNITY)
Admission: EM | Disposition: A | Discharge status: Hospice | Payer: Self-pay | Source: Home / Self Care | Attending: Internal Medicine

## 2013-07-02 ENCOUNTER — Inpatient Hospital Stay (HOSPITAL_COMMUNITY): Payer: Medicare Other

## 2013-07-02 DIAGNOSIS — D649 Anemia, unspecified: Secondary | ICD-10-CM

## 2013-07-02 DIAGNOSIS — I369 Nonrheumatic tricuspid valve disorder, unspecified: Secondary | ICD-10-CM

## 2013-07-02 DIAGNOSIS — K319 Disease of stomach and duodenum, unspecified: Secondary | ICD-10-CM

## 2013-07-02 DIAGNOSIS — I10 Essential (primary) hypertension: Secondary | ICD-10-CM

## 2013-07-02 DIAGNOSIS — R195 Other fecal abnormalities: Secondary | ICD-10-CM

## 2013-07-02 DIAGNOSIS — I5032 Chronic diastolic (congestive) heart failure: Secondary | ICD-10-CM

## 2013-07-02 DIAGNOSIS — K3189 Other diseases of stomach and duodenum: Secondary | ICD-10-CM

## 2013-07-02 DIAGNOSIS — K2970 Gastritis, unspecified, without bleeding: Secondary | ICD-10-CM

## 2013-07-02 HISTORY — PX: ESOPHAGOGASTRODUODENOSCOPY: SHX5428

## 2013-07-02 LAB — GLUCOSE, CAPILLARY
Glucose-Capillary: 111 mg/dL — ABNORMAL HIGH (ref 70–99)
Glucose-Capillary: 113 mg/dL — ABNORMAL HIGH (ref 70–99)
Glucose-Capillary: 145 mg/dL — ABNORMAL HIGH (ref 70–99)

## 2013-07-02 LAB — CBC
MCH: 28.9 pg (ref 26.0–34.0)
MCHC: 31.7 g/dL (ref 30.0–36.0)
Platelets: 126 10*3/uL — ABNORMAL LOW (ref 150–400)
RDW: 16.4 % — ABNORMAL HIGH (ref 11.5–15.5)
WBC: 7.2 10*3/uL (ref 4.0–10.5)

## 2013-07-02 LAB — IRON AND TIBC
Iron: 17 ug/dL — ABNORMAL LOW (ref 42–135)
UIBC: 297 ug/dL (ref 125–400)

## 2013-07-02 SURGERY — EGD (ESOPHAGOGASTRODUODENOSCOPY)
Anesthesia: Moderate Sedation

## 2013-07-02 MED ORDER — FENTANYL CITRATE 0.05 MG/ML IJ SOLN
INTRAMUSCULAR | Status: DC | PRN
  Administered 2013-07-02: 12.5 ug via INTRAVENOUS

## 2013-07-02 MED ORDER — MIDAZOLAM HCL 10 MG/2ML IJ SOLN
INTRAMUSCULAR | Status: AC
  Filled 2013-07-02: qty 2

## 2013-07-02 MED ORDER — FENTANYL CITRATE 0.05 MG/ML IJ SOLN
INTRAMUSCULAR | Status: AC
  Filled 2013-07-02: qty 2

## 2013-07-02 MED ORDER — ONDANSETRON HCL 4 MG PO TABS
4.0000 mg | ORAL_TABLET | Freq: Two times a day (BID) | ORAL | Status: DC
  Administered 2013-07-02 – 2013-07-05 (×8): 4 mg via ORAL
  Filled 2013-07-02 (×13): qty 1

## 2013-07-02 MED ORDER — BUTAMBEN-TETRACAINE-BENZOCAINE 2-2-14 % EX AERO
INHALATION_SPRAY | CUTANEOUS | Status: DC | PRN
  Administered 2013-07-02: 2 via TOPICAL

## 2013-07-02 MED ORDER — MIDAZOLAM HCL 10 MG/2ML IJ SOLN
INTRAMUSCULAR | Status: DC | PRN
  Administered 2013-07-02 (×2): 1 mg via INTRAVENOUS

## 2013-07-02 MED ORDER — LORAZEPAM 2 MG/ML IJ SOLN
0.2500 mg | Freq: Three times a day (TID) | INTRAMUSCULAR | Status: DC | PRN
  Administered 2013-07-02 – 2013-07-06 (×9): 0.25 mg via INTRAVENOUS
  Filled 2013-07-02 (×9): qty 1

## 2013-07-02 MED ORDER — METOCLOPRAMIDE HCL 5 MG PO TABS
5.0000 mg | ORAL_TABLET | Freq: Three times a day (TID) | ORAL | Status: DC
  Administered 2013-07-02 – 2013-07-06 (×11): 5 mg via ORAL
  Filled 2013-07-02 (×16): qty 1

## 2013-07-02 NOTE — Progress Notes (Signed)
No change from AM assessment, except pt is lethargic from procedure, but wakes easily.

## 2013-07-02 NOTE — Evaluation (Signed)
OT note   Noted pt in surgery this day.  Please re- order OT if indicated.  Thank you!  [place], [place] [phone]

## 2013-07-02 NOTE — Progress Notes (Signed)
PT Cancellation Note  Patient Details Name: LUANA TATRO MRN: [id number] DOB: [birth year]   Cancelled Treatment:    Reason Eval/Treat Not Completed: Patient at procedure or test/unavailable  Pt down for EDG this morning. Will check back this afternoon to see if patient able to participate in evaluation.    Marella Bile 07/02/2013, 10:56 AM Marella Bile, PT Pager: [phone] 07/02/2013

## 2013-07-02 NOTE — Progress Notes (Signed)
Clinical Social Work Department BRIEF PSYCHOSOCIAL ASSESSMENT 07/02/2013  Patient:  [name], [name]     Account Number:  [id number]     Admit date:  06/30/2013  Clinical Social Worker:  Doroteo Glassman  Date/Time:  07/02/2013 02:26 PM  Referred by:  Physician  Date Referred:  07/02/2013 Referred for  ALF Placement   Other Referral:   Interview type:  Other - See comment Other interview type:   Pt's daughter via phone    PSYCHOSOCIAL DATA Living Status:  FACILITY Admitted from facility:  [facility] Level of care:  Assisted Living Primary support name:  [name] Primary support relationship to patient:  CHILD, ADULT Degree of support available:   strong    CURRENT CONCERNS Current Concerns  Post-Acute Placement   Other Concerns:    SOCIAL WORK ASSESSMENT / PLAN Spoke with Pt's daughter via phone, as Pt not feeling well.    Pt's daughter confirmed that Pt is from [facility]F.  She's not sure, though, that Pt will be able to return.  Pt's daughter is concerned with Pt's weakness and she stated that both she and her brother were concerned about this prior to her hospitalization.  Pt has been to [facility] several time and has spend an extensive amount of time there; they'd be happy for her to go back, if need be.    CSW noted that PT to evaluate Pt.  Pt's daughter would like to await this eval to make a final decision.  Additionally, Pt's brother is part of the decision-making process and he'll be in town within the next couple of days.    CSW thanked Mrs. Hundley for her time.   Assessment/plan status:  Psychosocial Support/Ongoing Assessment of Needs Other assessment/ plan:   Information/referral to community resources:   n/a--Pt from an ALF    PATIENT'S/FAMILY'S RESPONSE TO PLAN OF CARE: Pt's daughter's response to Pt's plan of care is positive. She is fine with Pt going back to [facility], if that's the appropriate level  of care.  She's concerned about Pt's weakness and feels that Pt may need SNF.    Pt's daughter is coping well with Pt's medical situation. She was pleasant and cooperative.    Pt's daughter thanked CSW for time and assistance.   Providence Crosby, LCSWA Clinical Social Work [phone]

## 2013-07-02 NOTE — H&P (View-Only) (Signed)
                                                                           [facility] Consult: 8:58 AM 07/01/2013  LOS: 1 day    Referring Provider: Norins Primary Care Physician:  Michael Norins, MD Primary Gastroenterologist:  Dr. Stark  Reason for Consultation:  Nausea and bleeding per rectum   HPI: [name] is a 79 y.o. female.  Has severe mitral regurgitation, 3 vessel coronary artery disease, on medical management, hypertension, diabetes, history of atrial fibrillation, not on anticoagulation, history of anemia.  Hx of ascites, stable pericholecystic fluid, stable pancreatic tail cystic lesions 9back as far as 2007).  CT angio of abdomen in 2011 with congestive hepatopathy, mesenteric stenosis without obstruction, stable pancreatic cyst lesions. Has not had HIDA scan.   Has had persistent nausea for many months.  Evaluated in 02/2013 by Dr Stark for nausea, primarily morning occurrences.  Dr Stark suspected this was s/e of hydrocodone she uses. Also contributing to nausea and constipation were po Iron. He suggested continuing taking po Phenergen with her evening vicodin and switching iron to midday dosing.  Consideration for EGD if sxs persisted or worsened. Sugars running into 400s in Sept 2014.  She went to [facility].  meds had been promethazine and reglan, but phenergen substituted with Zofran mid October 2014.  Reports of trouble swallowing large potassium pills  Pt had outpt CT abdomen on 11/20 (see below). Pt developed 2 to 3 episodes of bleeding per rectum at assisted living yesterday. She was admittted last night.  Hgb of 10.0, c/w 8.0 to 12 over last several months. Platelets mildly reduced. U/A shows WBCs but rare bacteria   She can not tell me date of last BM, but did not have BM yesterday and tends to constipation.  Denies abdominal pain.  Nausea still  mostly in AM, resolves by later AM and for rest of day.  She says she doesn't vomit.  Denies dyspnea.  No extremity edema.  No dysuria. Does c/o left hip and leg pain.   This is controlled with her narcotics and is currently not controlled, as no narcotics on inpt medlist.       Past Medical History  Diagnosis Date  . Inguinal hernia   . Severe mitral regurgitation     a. 12/2011 Echo: Sev MR with flail leaflet.  . Other and unspecified hyperlipidemia   . Pneumonia   . Osteoarthrosis, unspecified whether generalized or localized, unspecified site   . Unspecified essential hypertension   . Type II or unspecified type diabetes mellitus without mention of complication, not stated as uncontrolled   . Coronary atherosclerosis of unspecified type of vessel, native or graft     a. 11/2007 Cath: 3vd, sev MR ->felt to be poor surgical candidate->Med Rx  . Chronic diastolic CHF (congestive heart failure)     a. 12/2011 Echo: EF 60-65%, No rwma, Mild AI, Sev MR w/ flail segment of ant mv leflet, massive bi-atrial enlargement, Mod TR, PASP 86.  . Atrial fibrillation     a. chronic - no longer on anticoagulation.  . Unspecified asthma(493.90)   . Anemia, unspecified   . Allergic rhinitis, cause   unspecified   . Adenomatous colon polyp 10/2009  . GIB (gastrointestinal bleeding)     2009  . Nephrolithiasis     Past Surgical History  Procedure Laterality Date  . Breast cyst aspiration      x50  . Abdominal hysterectomy  1990  . Lumbar fusion  2007    [facility]  . Total hip arthroplasty  01/2007    Right - Dr Lucy  . Inguinal hernia repair  2010    [facility]    Prior to Admission medications   Medication Sig Start Date End Date Taking? Authorizing Provider  acetaminophen (TYLENOL) 325 MG tablet Take 650 mg by mouth 3 (three) times daily.   Yes Historical Provider, MD  amLODipine (NORVASC) 5 MG tablet Take 5 mg by mouth daily.   Yes Historical Provider, MD  carvedilol (COREG) 6.25 MG tablet  Take 6.25 mg by mouth 2 (two) times daily with a meal.   Yes Historical Provider, MD  Cholecalciferol (VITAMIN D3) 1000 UNITS CAPS Take 1,000 Units by mouth daily.    Yes Historical Provider, MD  clonazePAM (KLONOPIN) 0.5 MG tablet Take 0.5 mg by mouth at bedtime. For anxiety   Yes Historical Provider, MD  digoxin (LANOXIN) 0.125 MG tablet Take 0.125 mg by mouth daily.   Yes Historical Provider, MD  docusate sodium (COLACE) 100 MG capsule Take 100 mg by mouth daily.   Yes Historical Provider, MD  Fe Fum-FePoly-FA-Vit C-Vit B3 (INTEGRA F) 125-1 MG CAPS Take 1 capsule by mouth daily.   Yes Historical Provider, MD  ferrous sulfate 325 (65 FE) MG tablet Take 325 mg by mouth daily with breakfast.   Yes Historical Provider, MD  furosemide (LASIX) 40 MG tablet Take 60 mg by mouth 2 (two) times daily.   Yes Historical Provider, MD  GLUCERNA (GLUCERNA) LIQD Take 237 mLs by mouth 3 (three) times daily between meals.   Yes Historical Provider, MD  HYDROcodone-acetaminophen (NORCO/VICODIN) 5-325 MG per tablet Take 1 tablet by mouth every 4 (four) hours as needed for moderate pain.   Yes Historical Provider, MD  insulin glargine (LANTUS) 100 UNIT/ML injection Inject 55 Units into the skin at bedtime.   Yes Historical Provider, MD  irbesartan (AVAPRO) 150 MG tablet Take 150 mg by mouth daily.   Yes Historical Provider, MD  loratadine (CLARITIN) 10 MG tablet Take 10 mg by mouth daily.   Yes Historical Provider, MD  metFORMIN (GLUCOPHAGE) 500 MG tablet Take 500 mg by mouth 2 (two) times daily with a meal.   Yes Historical Provider, MD  metoCLOPramide (REGLAN) 5 MG tablet Take 5 mg by mouth 3 (three) times daily.   Yes Historical Provider, MD  ondansetron (ZOFRAN) 4 MG tablet Take 4 mg by mouth every 8 (eight) hours as needed for nausea or vomiting.   Yes Historical Provider, MD  ONE TOUCH ULTRA TEST test strip CHECK BLOOD GLUCOSE TWICE A DAY 05/04/13  Yes Michael E Norins, MD  pantoprazole (PROTONIX) 40 MG tablet  Take 40 mg by mouth daily before breakfast. 03/10/13  Yes Michael E Norins, MD  potassium chloride 20 MEQ/15ML (10%) solution Take 20 mEq by mouth 3 (three) times daily.   Yes Historical Provider, MD  promethazine (PHENERGAN) 12.5 MG tablet Take 12.5 mg by mouth every 6 (six) hours as needed for nausea or vomiting.   Yes Historical Provider, MD  RELION MINI PEN NEEDLES 31G X 6 MM MISC 1 Container. 02/25/12  Yes Historical Provider, MD  senna-docusate (SENOKOT-S) 8.6-50 MG   per tablet Take 1 tablet by mouth at bedtime.   Yes Historical Provider, MD  temazepam (RESTORIL) 15 MG capsule Take 15 mg by mouth at bedtime.   Yes Historical Provider, MD  traZODone (DESYREL) 50 MG tablet Take 25 mg by mouth at bedtime.   Yes Historical Provider, MD    Scheduled Meds: . carvedilol  6.25 mg Oral BID WC  . cefTRIAXone (ROCEPHIN)  IV  1 g Intravenous Q24H  . clonazePAM  0.5 mg Oral QHS  . clonazePAM  0.5 mg Oral QHS  . digoxin  0.125 mg Oral Daily  . furosemide  40 mg Oral BID  . insulin aspart  0-9 Units Subcutaneous Q4H  . insulin glargine  15 Units Subcutaneous QHS  . pantoprazole (PROTONIX) IV  40 mg Intravenous Q12H  . sodium chloride  3 mL Intravenous Q12H   Infusions:   PRN Meds: acetaminophen, acetaminophen, ondansetron (ZOFRAN) IV, ondansetron   Allergies as of 06/30/2013 - Review Complete 06/30/2013  Allergen Reaction Noted  . Codeine      Family History  Problem Relation Age of Onset  . Heart disease Father     CHF  . Lymphoma Sister   . Colon cancer Neg Hx     History   Social History  . Marital Status: Widowed    Spouse Name: N/A    Number of Children: N/A  . Years of Education: N/A   Occupational History  . Not on file.   Social History Main Topics  . Smoking status: Never Smoker   . Smokeless tobacco: Never Used  . Alcohol Use: No  . Drug Use: No  . Sexual Activity: No   Other Topics Concern  . Not on file   Social History Narrative   Married '42 - 38 years -  Widowed   1 son - '50, 1 dtr - '46   4 grandchildren   Regular Exercise -  NO      Lives in snf.      End of life issues: no prolonged mechanical vent. Suport. Laymen's guide to death with dignity provided with a suggestion that she discuss these issues with her family. Her son was supportive of having this discussion.    REVIEW OF SYSTEMS: 12 system review per HPI   PHYSICAL EXAM: Vital signs in last 24 hours: Filed Vitals:   07/01/13 0441  BP: 135/62  Pulse: 63  Temp: 97.4 F (36.3 C)  Resp: 22   Wt Readings from Last 3 Encounters:  06/30/13 60.9 kg (134 lb 4.2 oz)  05/08/13 57.97 kg (127 lb 12.8 oz)  04/12/13 54.341 kg (119 lb 12.8 oz)    General: frail, aged WF who is in pain with hip pain Head:  No swelling  Eyes:  No icterus or pallor Ears:  Not HOH  Nose:  Some nasal drip Mouth:  Geographic tongue appearance, moist MM Neck:  No mass, no JVD Lungs:  Crackles in bases, no dyspnea Heart: Harsh, loud systolic murmer, regualar rythm Abdomen:  Soft, NT, ND, firm ridge in RUQ is not tender ? Is this collection of stool.   Rectal: no stool impaction.scant specimen is greenish brown and FOB negativwe   Musc/Skeltl: no joint swelling Extremities:  No pedal edema  Neurologic:  Oriented to self and place but not to date.  Poor recall of any relevant recent medical occurrences. Skin:  No rash Tattoos:  On lower eye lid.  Nodes:  No cervical or inguinal adenopathy   Psych:    Distressed, agitated, moaning at times  Intake/Output from previous day: 11/21 0701 - 11/22 0700 In: 625 [IV Piggyback:625] Out: 775 [Urine:775] Intake/Output this shift:    LAB RESULTS:  Recent Labs  06/30/13 1834 06/30/13 2247 07/01/13 0440  WBC 10.8* 10.1 8.7  HGB 10.1* 10.1* 10.0*  HCT 32.2* 31.8* 31.9*  PLT 152 149* 143*   BMET Lab Results  Component Value Date   NA 138 07/01/2013   NA 135 06/30/2013   NA 137 06/30/2013   K 3.1* 07/01/2013   K 2.9* 06/30/2013   K 2.7*  06/30/2013   CL 96 07/01/2013   CL 92* 06/30/2013   CL 92* 06/30/2013   CO2 30 07/01/2013   CO2 30 06/30/2013   CO2 31 06/30/2013   GLUCOSE 80 07/01/2013   GLUCOSE 143* 06/30/2013   GLUCOSE 185* 06/30/2013   BUN 39* 07/01/2013   BUN 36* 06/30/2013   BUN 38* 06/30/2013   CREATININE 1.27* 07/01/2013   CREATININE 1.15* 06/30/2013   CREATININE 1.04 06/30/2013   CALCIUM 9.5 07/01/2013   CALCIUM 9.4 06/30/2013   CALCIUM 10.1 06/30/2013   LFT  Recent Labs  06/29/13 1431 06/30/13 1403 07/01/13 0440  PROT 7.9 7.6 6.9  ALBUMIN 3.7 3.6 3.2*  AST 26 30 22  ALT 21 20 17  ALKPHOS 136* 120* 100  BILITOT 1.4* 2.9* 2.5*   PT/INR Lab Results  Component Value Date   INR 1.20 06/30/2013   INR 1.03 03/28/2012   INR 1.14 10/09/2009   Hepatitis Panel No results found for this basename: HEPBSAG, HCVAB, HEPAIGM, HEPBIGM,  in the last 72 hours C-Diff No components found with this basename: cdiff   Lipase     Component Value Date/Time   LIPASE 16 06/30/2013 1403     RADIOLOGY STUDIES: Ct Abdomen Pelvis Wo Contrast 06/29/2013   CLINICAL DATA:  Abdominal pain, chronic nausea  EXAM: CT ABDOMEN AND PELVIS WITHOUT CONTRAST  TECHNIQUE: Multidetector CT imaging of the abdomen and pelvis was performed following the standard protocol without intravenous contrast.  COMPARISON:  Prior chest CT 10/14/2012 ; prior CT abdomen/pelvis 02/15/2010.  FINDINGS: Lower Chest: Dependent atelectasis in the bilateral lower lobes. Trace right pleural effusion. Marked enlargement of the right greater than left heart. Atherosclerotic calcification noted in the mitral annulus. No large pericardial effusion. Unremarkable visualized distal thoracic esophagus.  Abdomen: Unenhanced CT was performed per clinician order. Lack of IV contrast limits sensitivity and specificity, especially for evaluation of abdominal/pelvic solid viscera. Within these limitations, unremarkable CT appearance of the stomach, duodenum, spleen, we and  adrenal glands. The multi lobulated relatively low attenuation complex cystic lesion affiliated with the tail of the pancreas demonstrates slow interval enlargement compared to 2007. Currently, the mass measures 4.4 x 4.0 cm in greatest transverse diameter open (image 24, series 2) compared to 3.2 x 3.2 cm. Stable subcentimeter hypoattenuating lesion in hepatic segment 2 most consistent with a small simple cyst. Abnormal nodular hepatic contour again noted. There is trace perihepatic ascites.  Atrophy of the pancreatic head and neck. No hydronephrosis or significant nephrolithiasis.  Although no radiopaque cholelithiasis are seen, there appears to be diffuse moderate gallbladder wall thickening.  All No evidence of obstruction or focal bowel wall thickening. Normal appendix in the right lower quadrant. The terminal ileum is unremarkable.  Pelvis: The bladder is distended with urine. Slightly limited evaluation secondary to streak artifact from right hip prosthesis. Trace free fluid in the cul-de-sac. Surgical changes of prior hysterectomy.  Bones/Soft Tissues: No acute   fracture or aggressive appearing lytic or blastic osseous lesion. L3-L5 posterior lumbar interbody fusion. Advanced left hip osteoarthritis.  Vascular: Extensive atherosclerotic vascular calcifications without aneurysmal dilatation. Limited evaluation in the absence of intravenous contrast.  IMPRESSION: 1. Slow interval enlargement of a complex multiloculated cystic mass affiliated with the tail of the pancreas compared to 2011 as detailed above. Differential considerations include a pancreatic pseudocyst if the patient has a prior history of pancreatitis, and both benign and malignant cystic pancreatic neoplasm. If further imaging is clinically warranted, consider outpatient MRI of the abdomen with and without contrast. 2. Hepatic cirrhosis with small volume ascites. 3. Nonspecific mild gallbladder wall thickening may be related to the patient's  underlying hepatic disease, right heart failure or hypoalbuminemia. Alternately, in the appropriate clinical setting acute cholecystitis is a possibility. If further imaging is clinically warranted, consider right upper quadrant ultrasound or nuclear medicine HIDA scan. 4. Extensive atherosclerotic vascular calcifications. 5. Marked right greater than left cardiomegaly. 6. Advanced left hip osteoarthritis. 7. Prior surgical changes of L3-L5 posterior lumbar interbody fusion right total hip arthroplasty. 8. Additional ancillary findings as above.   Electronically Signed   By: Heath  McCullough M.D.   On: 06/29/2013 16:37   Portable Chest 1 View 06/30/2013   CLINICAL DATA:  Bleeding from rectum today, shortness of breath, weakness, fatigue, cough, hypertension, diabetes  EXAM: PORTABLE CHEST - 1 VIEW  COMPARISON:  Portable exam 1707 hr compared to 10/17/2012  FINDINGS: Enlargement of cardiac silhouette with pulmonary vascular congestion.  Atherosclerotic calcification of a and elongated thoracic aorta.  Interstitial infiltrates the perihilar regions likely representing pulmonary edema and CHF.  No gross pleural effusion or pneumothorax.  Bones demineralized.  IMPRESSION: Enlargement of cardiac silhouette with pulmonary vascular congestion and probable mild pulmonary edema/CHF.   Electronically Signed   By: Mark  Boles M.D.   On: 06/30/2013 17:13   Us Abdomen Limited Ruq 06/29/2013   CLINICAL DATA:  Assess gallbladder.  EXAM: US ABDOMEN LIMITED - RIGHT UPPER QUADRANT  COMPARISON:  CT same day.  FINDINGS: Gallbladder  No definite gallstones. Several small non mobile echogenic foci along the wall of the gallbladder without shadowing which may represent small polyps versus benign cholecystosis. There is minimal gallbladder wall thickening measuring 4.2 mm which is nonspecific and may be due to patient's known mild ascites. There is a negative sonographic Murphy sign.  Common bile duct  Diameter: 3.8 mm.  Liver:   Suggestion of mild fatty infiltration without focal mass.  IMPRESSION: No evidence of gallstones or acute cholecystitis. Mild gallbladder wall thickening of 4.2 mm which is nonspecific in the setting of mild ascites.  Several tiny echogenic foci along the wall of the gallbladder which may represent small polyps versus benign cholesterolosis.  Fatty infiltration of the liver.   Electronically Signed   By: Jazzmin Newbold  Boyle M.D.   On: 06/29/2013 20:29    ENDOSCOPIC STUDIES: Colonoscopy 10/2009 ENDOSCOPIC IMPRESSION:  1) 4 - 5 mm Two polyps in the mid transverse colon  2) 10 mm pedunculated polyp in the sigmoid colon  3) Internal hemorrhoids  RECOMMENDATIONS:  1) No aspirin or NSAID's for 2 weeks  2) Await pathology results  3) No plans for future surveillance colonoscopies due to age and  comorbidities Pathology 1. COLON, POLYP(S), 2 SMALL TRANSVERSE; 1 SIGMOID : - ONE LARGE TUBULAR ADENOMA(1.0 CM). - two smaller tubular adenomas. - no high grade dysplasia or malignancy identified.   IMPRESSION:   *  Rectal bleeding, minor.  Now FOB   negative.  Wonder if constipation contributing.   *  Nausea. Chronic. Minor, now resolved elevation of Alk phos, rising bilirubin, normal Lipase.  Cystic lesion of pancreas, cirrhosis with minor ascites, ? GB polyps vs cholesterolosis on imaging.  , .   *  Chronic, stable doses of  Narcotics for arthritis pain.   *  Hypokalemia  *  Hx adenomatous colon polyps 2011  *  CHF, mild by CXR  *  Severe mitral regurge, a fib, 3 vsl CAD.  No anticoagulation  *  IDDM.  Not recently well controlled.       PLAN:     *  Per Dr Cyd Hostler:  EGD tomorrow.  *  Will add Miralax, bisacodyl.  Her vicodin needs to be reatrted, do not think she can go without measuring by this am's complaints of pain.   Sarah Gribbin  07/01/2013, 8:58 AM Pager: [phone]    ________________________________________________________________________  [facility] MD note:  I  personally examined the patient, reviewed the data and agree with the assessment and plan described above.  Planning on EGD tomorrow for worsening nausea despite conservative treatments.  Perhaps H. Pylori, PUD, doubt malignancy.  I explained risks, benefits, alternatives to patient and her daughter and they wish to proceed.  Her pancreatic cystic lesion has been present for at least 7 years, only minimal growth.  On CT no overt suggestion of malignancy.  I suggest that this does not require further surveillance given overall clinical situation.     Layonna Dobie, MD [facility] Pager [phone]   

## 2013-07-02 NOTE — Progress Notes (Signed)
Pt returned from endoscopy after getting EGD this am. Pt awake.Daughter at bedside. VS WNL.

## 2013-07-02 NOTE — Progress Notes (Signed)
Subjective: Chart reviewed - no events recorded. For EGD this AM. Copy of EGD report: mild gastritis w/o lesion. Mrs. Lecker is somnolent and not in any distress. Her dtr reports she did take some POs this AM.  Objective: Lab:  Recent Labs  06/29/13 1431 06/30/13 1403  06/30/13 2247 07/01/13 0440 07/02/13 0408  WBC 5.6 11.7*  < > 10.1 8.7 7.2  NEUTROABS 4.3 10.0*  --   --   --   --   HGB 10.5* 10.5*  < > 10.1* 10.0* 9.9*  HCT 33.3* 33.5*  < > 31.8* 31.9* 31.2*  MCV 91.5 90.8  < > 90.6 90.9 91.2  PLT 170 178  < > 149* 143* 126*  < > = values in this interval not displayed.  Recent Labs  06/30/13 1403 06/30/13 1833 06/30/13 2247 07/01/13 0440  NA 137  --  135 138  K 2.7*  --  2.9* 3.1*  CL 92*  --  92* 96  GLUCOSE 185*  --  143* 80  BUN 38*  --  36* 39*  CREATININE 1.04  --  1.15* 1.27*  CALCIUM 10.1  --  9.4 9.5  MG  --  1.6  --   --    Troponin < 0.30 x 3;  BNP 5,485 Imaging:  Scheduled Meds: . carvedilol  6.25 mg Oral BID WC  . cefTRIAXone (ROCEPHIN)  IV  1 g Intravenous Q24H  . clonazePAM  0.5 mg Oral QHS  . clonazePAM  0.5 mg Oral QHS  . digoxin  0.125 mg Oral Daily  . insulin aspart  0-9 Units Subcutaneous Q6H  . insulin glargine  15 Units Subcutaneous QHS  . metoCLOPramide  5 mg Oral TID AC  . ondansetron  4 mg Oral Q12H  . pantoprazole  40 mg Oral Q0600  . polyethylene glycol  17 g Oral Daily  . sodium chloride  3 mL Intravenous Q12H   Continuous Infusions:  PRN Meds:.acetaminophen, acetaminophen, bisacodyl, LORazepam   Physical Exam: Filed Vitals:   07/02/13 0944  BP: 132/55  Pulse: 53  Temp: 97.6 F (36.4 C)  Resp: 21    Intake/Output Summary (Last 24 hours) at 07/02/13 1109 Last data filed at 07/02/13 0456  Gross per 24 hour  Intake 402.67 ml  Output   2151 ml  Net -1748.33 ml   Gen'l- elderly woman in no acute distress Cor - RRR, II/VI mm heard to back and apex Pulm - shallow inspirations, no increased WOB, cannot appreciate  rales Abd- soft. Neuro - sleepy.      Assessment/Plan: 1. GI - multifactorial nausea. Reviewed all meds - the only med that is optional is iron. Plan Continue PPI  Iron panel - if deficient - IV iron. Will stop PO iron  2. Cardiac - good diuresis to IV lasix. 2 D echo pending. BNP 5,485 Plan Continue lasix  BNP in AM  F/u CXR this PM-2 view.  3. Heme - Hgb stable with insignificant drop to 9.9 g. Plan D/c po iron  4. ID - #3 Rocephin for UTI.   Illene Regulus [facility] (o) [phone]; (c) [phone] Call-grp - [name]  Tele: [phone]  07/02/2013, 11:01 AM

## 2013-07-02 NOTE — Progress Notes (Signed)
  Echocardiogram 2D Echocardiogram has been performed.  Arvil Chaco 07/02/2013, 12:59 PM

## 2013-07-02 NOTE — Interval H&P Note (Signed)
History and Physical Interval Note:  07/02/2013 8:24 AM  [name]  has presented today for surgery, with the diagnosis of nausea  The various methods of treatment have been discussed with the patient and family. After consideration of risks, benefits and other options for treatment, the patient has consented to  Procedure(s): ESOPHAGOGASTRODUODENOSCOPY (EGD) (N/A) as a surgical intervention .  The patient's history has been reviewed, patient examined, no change in status, stable for surgery.  I have reviewed the patient's chart and labs.  Questions were answered to the patient's satisfaction.     Rachael Fee

## 2013-07-03 ENCOUNTER — Encounter (HOSPITAL_COMMUNITY): Payer: Self-pay | Admitting: Gastroenterology

## 2013-07-03 DIAGNOSIS — I509 Heart failure, unspecified: Secondary | ICD-10-CM

## 2013-07-03 LAB — GLUCOSE, CAPILLARY
Glucose-Capillary: 113 mg/dL — ABNORMAL HIGH (ref 70–99)
Glucose-Capillary: 195 mg/dL — ABNORMAL HIGH (ref 70–99)

## 2013-07-03 MED ORDER — SODIUM CHLORIDE 0.9 % IV SOLN
1275.0000 mg | Freq: Once | INTRAVENOUS | Status: AC
  Administered 2013-07-03: 17:00:00 1275 mg via INTRAVENOUS
  Filled 2013-07-03: qty 25.5

## 2013-07-03 MED ORDER — FUROSEMIDE 40 MG PO TABS
40.0000 mg | ORAL_TABLET | Freq: Every day | ORAL | Status: DC
  Administered 2013-07-03 – 2013-07-06 (×4): 40 mg via ORAL
  Filled 2013-07-03 (×4): qty 1

## 2013-07-03 MED ORDER — CEFUROXIME AXETIL 250 MG PO TABS
250.0000 mg | ORAL_TABLET | Freq: Two times a day (BID) | ORAL | Status: DC
  Administered 2013-07-03 – 2013-07-06 (×5): 250 mg via ORAL
  Filled 2013-07-03 (×8): qty 1

## 2013-07-03 MED ORDER — IRON DEXTRAN 50 MG/ML IJ SOLN
25.0000 mg | Freq: Once | INTRAMUSCULAR | Status: AC
  Administered 2013-07-03: 25 mg via INTRAVENOUS
  Filled 2013-07-03: qty 0.5

## 2013-07-03 NOTE — Op Note (Addendum)
[facility] [address] [place], [address]   ENDOSCOPY PROCEDURE REPORT  PATIENT: [name], [name]  MR#: [id number] BIRTHDATE: February 09, 1920 , 89  yrs. old GENDER: Female ENDOSCOPIST: Rachael Fee, MD PROCEDURE DATE:  07/02/2013 PROCEDURE:  EGD w/ biopsy ASA CLASS:     Class IV INDICATIONS:  chronic nausea. MEDICATIONS: Fentanyl 12.5 mcg IV and Versed 2 mg IV TOPICAL ANESTHETIC: Cetacaine Spray  DESCRIPTION OF PROCEDURE: After the risks benefits and alternatives of the procedure were thoroughly explained, informed consent was obtained.  The Pentax Gastroscope [id number] endoscope was introduced through the mouth and advanced to the second portion of the duodenum. Without limitations.  The instrument was slowly withdrawn as the mucosa was fully examined.    There was mild non-specific distal gastritis.  This was biopsied and sent to pathology.  There was typical appearing, mild portal gastropathy throughout mid and proximal stomach.  There were no esophageal or gastric varicies.  The examination was otherwise normal.  Retroflexed views revealed no abnormalities.     The scope was then withdrawn from the patient and the procedure completed. COMPLICATIONS: There were no complications.  ENDOSCOPIC IMPRESSION: There was mild non-specific distal gastritis.  This was biopsied and sent to pathology.  There was typical appearing, mild portal gastropathy throughout mid and proximal stomach.  There were no esophageal or gastric varicies.  The examination was otherwise normal.  RECOMMENDATIONS: Treat naueas empirically with scheduled antiemetics (zofran 5mg  twice daily, reglan 5mg  po tid...both on scheduled basis rather than PRN. Her nausea is multifactorial;  several meds can contribute (digoxin, narcotic pain meds, glucophage, trazodone), diabetic related gastroparesis may be playing a role, her advanced age, frail state as well.   eSigned:  Rachael Fee,  MD 07/02/2013 9:07 AM   CC: Dr. Melissa Montane

## 2013-07-03 NOTE — Progress Notes (Signed)
PT Cancellation Note  Patient Details Name: [name] MRN: [id number] DOB: 1919/09/01   Cancelled Treatment:    Reason Eval/Treat Not Completed: Medical issues which prohibited therapy (pain)   Rada Hay 07/03/2013, 2:57 PM Blanchard Kelch PT [phone]

## 2013-07-03 NOTE — Progress Notes (Addendum)
Subjective: [name] is awake. She reports that she feels bad all over. She does admit that her nausea is better and is asking for a diet.   Objective: Lab:  Recent Labs  06/30/13 1403  06/30/13 2247 07/01/13 0440 07/02/13 0408  WBC 11.7*  < > 10.1 8.7 7.2  NEUTROABS 10.0*  --   --   --   --   HGB 10.5*  < > 10.1* 10.0* 9.9*  HCT 33.5*  < > 31.8* 31.9* 31.2*  MCV 90.8  < > 90.6 90.9 91.2  PLT 178  < > 149* 143* 126*  < > = values in this interval not displayed.  Recent Labs  06/30/13 1403 06/30/13 1833 06/30/13 2247 07/01/13 0440  NA 137  --  135 138  K 2.7*  --  2.9* 3.1*  CL 92*  --  92* 96  GLUCOSE 185*  --  143* 80  BUN 38*  --  36* 39*  CREATININE 1.04  --  1.15* 1.27*  CALCIUM 10.1  --  9.4 9.5  MG  --  1.6  --   --    Fe 17, % sat 5% Imaging: 2 D echo 11/23: Study Conclusions  - Left ventricle: The cavity size was mildly dilated. Systolic function was normal. The estimated ejection fraction was in the range of 55% to 60%. Wall motion was normal; there were no regional wall motion abnormalities. - Aortic valve: Cusp separation was moderately reduced. Mild regurgitation. Valve area: 1.37cm^2(VTI). Valve area: 1.37cm^2 (Vmax). - Mitral valve: Calcified annulus. Mildly thickened leaflets . Partial flail anteriorleaflet. Severe regurgitation directed eccentrically and posteriorly. Valve area by continuity equation (using LVOT flow): 0.93cm^2. - Left atrium: The atrium was severely dilated. - Right ventricle: The cavity size was mildly dilated. Wall thickness was normal. - Right atrium: The atrium was severely dilated. - Tricuspid valve: Moderate-severe regurgitation. - Pulmonary arteries: Systolic pressure was severely increased. PA peak pressure: 71mm Hg (S). Impressions:  - Compared to the prior study, there has been no significant interval change.  11/23  CXR -: IMPRESSION:  Stable cardiomegaly and mild interstitial edema.  Increased small  bilateral pleural effusions and bibasilar  atelectasis.   Scheduled Meds: . carvedilol  6.25 mg Oral BID WC  . cefTRIAXone (ROCEPHIN)  IV  1 g Intravenous Q24H  . clonazePAM  0.5 mg Oral QHS  . clonazePAM  0.5 mg Oral QHS  . digoxin  0.125 mg Oral Daily  . insulin aspart  0-9 Units Subcutaneous Q6H  . insulin glargine  15 Units Subcutaneous QHS  . metoCLOPramide  5 mg Oral TID AC  . ondansetron  4 mg Oral Q12H  . pantoprazole  40 mg Oral Q0600  . polyethylene glycol  17 g Oral Daily  . sodium chloride  3 mL Intravenous Q12H   Continuous Infusions:  PRN Meds:.acetaminophen, acetaminophen, bisacodyl, LORazepam   Physical Exam: Filed Vitals:   07/03/13 0430  BP: 114/47  Pulse: 61  Temp: 97.4 F (36.3 C)  Resp: 20    Intake/Output Summary (Last 24 hours) at 07/03/13 0809 Last data filed at 07/03/13 0710  Gross per 24 hour  Intake    183 ml  Output    903 ml  Net   -720 ml   Gen'l- elderly woman who appear ill HEENT- unremarkable Cor - III/VI systolic mm best at apex but audible across the precordium, normal rhythm. Pulm - rales at the left base even in the right decubitus position. No  wheezing Abd - soft, BS+ Neuro - alert, clear speech. Orient to place and examiner.     Assessment/Plan: 1.GI - continued but improved nausea. She is asking for a diet. Her iron level is very low. Plan IV iron - pharmacy to dose   Advance diet  Continue anti-emetics  2. Cardiac - 2 D echo reviewed - no significant change since last study: normal EF, very likely pseudo-normal in setting of probable diastolic dysfunction; severe mitral valve dysfunction. Exam with rales but CXR 11/23 w/o reading of edema. BNP was mildly elevated Plan  Continue current medical therapy with oral diuretic  3. Heme - Hgb was stable. Profoundly iron deficient. Plan IV iron  4. ID - #4 IV rocephin for UTI Plan D/c rocephin  Start ceftin 250 mg bid.   5. DM   CBG (last 3)   Recent Labs   07/02/13 1135 07/02/13 1707 07/03/13 0727  GLUCAP 145* 113* 113*   Lab Results  Component Value Date   HGBA1C 7.2* 06/30/2013     Plan Continue low dose lantus and ss.  Avoid Metformin due to potential for GI effects.        Illene Regulus [place] [facility] (o) [phone]; (c) [phone] Call-grp - [name]  Tele: [phone]  07/03/2013, 8:03 AM

## 2013-07-03 NOTE — Progress Notes (Signed)
MEDICATION RELATED CONSULT NOTE - INITIAL   Pharmacy Consult for Iron Indication: Low iron saturation  Allergies  Allergen Reactions  . Codeine     REACTION: Hives    Patient Measurements: Height: 5\' 4"  (162.6 cm) Weight: 133 lb 2.5 oz (60.4 kg) IBW/kg (Calculated) : 54.7  Vital Signs: Temp: 97.4 F (36.3 C) (11/24 0430) Temp src: Oral (11/24 0430) BP: 114/47 mmHg (11/24 0430) Pulse Rate: 61 (11/24 0430) Intake/Output from previous day: 11/23 0701 - 11/24 0700 In: 183 [P.O.:180; I.V.:3] Out: 902 [Urine:900; Stool:2]  Labs:  Recent Labs  06/30/13 1403 06/30/13 1833  06/30/13 2247 07/01/13 0440 07/02/13 0408  WBC 11.7*  --   < > 10.1 8.7 7.2  HGB 10.5*  --   < > 10.1* 10.0* 9.9*  HCT 33.5*  --   < > 31.8* 31.9* 31.2*  PLT 178  --   < > 149* 143* 126*  APTT 39*  --   --   --   --   --   CREATININE 1.04  --   --  1.15* 1.27*  --   MG  --  1.6  --   --   --   --   ALBUMIN 3.6  --   --   --  3.2*  --   PROT 7.6  --   --   --  6.9  --   AST 30  --   --   --  22  --   ALT 20  --   --   --  17  --   ALKPHOS 120*  --   --   --  100  --   BILITOT 2.9*  --   --   --  2.5*  --   < > = values in this interval not displayed. Estimated Creatinine Clearance: 24.9 ml/min (by C-G formula based on Cr of 1.27).   Medical History: Past Medical History  Diagnosis Date  . Inguinal hernia   . Severe mitral regurgitation     a. 12/2011 Echo: Sev MR with flail leaflet.  . Other and unspecified hyperlipidemia   . Pneumonia   . Osteoarthrosis, unspecified whether generalized or localized, unspecified site   . Unspecified essential hypertension   . Type II or unspecified type diabetes mellitus without mention of complication, not stated as uncontrolled   . Coronary atherosclerosis of unspecified type of vessel, native or graft     a. 11/2007 Cath: 3vd, sev MR ->felt to be poor surgical candidate->Med Rx  . Chronic diastolic CHF (congestive heart failure)     a. 12/2011 Echo: EF  60-65%, No rwma, Mild AI, Sev MR w/ flail segment of ant mv leflet, massive bi-atrial enlargement, Mod TR, PASP 86.  . Atrial fibrillation     a. chronic - no longer on anticoagulation.  Marland Kitchen Unspecified asthma(493.90)   . Anemia, unspecified   . Allergic rhinitis, cause unspecified   . Adenomatous colon polyp 10/2009  . GIB (gastrointestinal bleeding)     2009  . Nephrolithiasis     Medications:  Scheduled:  . carvedilol  6.25 mg Oral BID WC  . cefUROXime  250 mg Oral BID WC  . clonazePAM  0.5 mg Oral QHS  . clonazePAM  0.5 mg Oral QHS  . digoxin  0.125 mg Oral Daily  . furosemide  40 mg Oral Daily  . insulin aspart  0-9 Units Subcutaneous Q6H  . insulin glargine  15 Units Subcutaneous QHS  .  metoCLOPramide  5 mg Oral TID AC  . ondansetron  4 mg Oral Q12H  . pantoprazole  40 mg Oral Q0600  . polyethylene glycol  17 g Oral Daily  . sodium chloride  3 mL Intravenous Q12H   Infusions:    Assessment: 79 yoF admitted 11/21 with profound nausea and rectal bleeding.  Pharmacy is asked to dose IV iron on 11/24 for low iron levels.  Iron studies:  Serum Iron = 17, Saturation 5%  Hgb 9.9  Previous therapy: ferrous sulfate 325mg  PO daily prior to admission.  Goal of Therapy:  Desired Hgb 14.8 g/dL  Plan:   Iron Dextran test dose 25mg  IV over 1-2 minutes.  Observe patient for 1 hour.  If no reaction after 1 hour, follow with total dose infusion  Iron Dextran total dose  (1300mg  - 25mg  test dose) = 1275mg  IV in NS   Lynann Beaver PharmD, BCPS Pager [phone] 07/03/2013 10:27 AM

## 2013-07-03 NOTE — Progress Notes (Signed)
[facility] Progress Note  Subjective:  Nausea is better.  Ate breakfast.  No BM and no more rectal bleeding noted.  Objective:  Vital signs in last 24 hours: Temp:  [97.4 F (36.3 C)-98.1 F (36.7 C)] 97.4 F (36.3 C) (11/24 0430) Pulse Rate:  [53-87] 61 (11/24 0430) Resp:  [17-21] 20 (11/24 0430) BP: (114-143)/(39-72) 114/47 mmHg (11/24 0430) SpO2:  [96 %-98 %] 97 % (11/24 0430) Weight:  [133 lb 2.5 oz (60.4 kg)] 133 lb 2.5 oz (60.4 kg) (11/24 0430) Last BM Date: 07/01/13 General:  Alert, frail, in NAD; chronically ill-appearing. Heart:  Regular rate and rhythm; 3/6 murmur noted Pulm:  Rales noted in lung bases. Abdomen:  Soft, non-tender and non-distended. Normal bowel sounds. Extremities:  Slight edema in B/L LE's Neurologic:  Alert and  Oriented;  grossly normal neurologically.  Intake/Output from previous day: 11/23 0701 - 11/24 0700 In: 183 [P.O.:180; I.V.:3] Out: 902 [Urine:900; Stool:2] Intake/Output this shift: Total I/O In: -  Out: 1 [Stool:1]  Lab Results:  Recent Labs  06/30/13 2247 07/01/13 0440 07/02/13 0408  WBC 10.1 8.7 7.2  HGB 10.1* 10.0* 9.9*  HCT 31.8* 31.9* 31.2*  PLT 149* 143* 126*   BMET  Recent Labs  06/30/13 1403 06/30/13 2247 07/01/13 0440  NA 137 135 138  K 2.7* 2.9* 3.1*  CL 92* 92* 96  CO2 31 30 30   GLUCOSE 185* 143* 80  BUN 38* 36* 39*  CREATININE 1.04 1.15* 1.27*  CALCIUM 10.1 9.4 9.5   LFT  Recent Labs  07/01/13 0440  PROT 6.9  ALBUMIN 3.2*  AST 22  ALT 17  ALKPHOS 100  BILITOT 2.5*   PT/INR  Recent Labs  06/30/13 1403  LABPROT 14.9  INR 1.20   Dg Chest 2 View  07/02/2013   CLINICAL DATA:  Shortness of breath.  Cough.  Pulmonary edema.  EXAM: CHEST  2 VIEW  COMPARISON:  06/30/2013  FINDINGS: Marked cardiac enlargement remains stable. Mild interstitial edema pattern shows no significant change. Small bilateral pleural effusions and bibasilar atelectasis show mild increase since prior study.   IMPRESSION: Stable cardiomegaly and mild interstitial edema.  Increased small bilateral pleural effusions and bibasilar atelectasis.   Electronically Signed   By: Myles Rosenthal M.D.   On: 07/02/2013 21:58    Assessment / Plan: *Anemia:  This is long-standing and likely multifactorial (chronic disease).  Hgb is stable.  She is going to receive IV iron per primary service.     *Chronic nausea:  Likely multi-factorial secondary to meds, diabetes, etc.  EGD on 11/23.  Continue anti-emetics.  Ok to advance diet. *Chronic arthritic pain:  On stable doses of narcotics (Vicodin). *Hypokalemia:  Being repleted by primary service.  *Hx adenomatous colon polyps on colonoscopy in 2011  *Severe mitral regurge, CHF, afib, 3 vsl CAD.  Not on anticoagulation.  * IDDM:  Not well controlled recently.  *New findings of cirrhosis on imaging:  Likely cause of elevated total bili.  **Will likely sign off from a GI standpoint.    LOS: 3 days   ZEHR, JESSICA D.  07/03/2013, 9:02 AM  Pager number [phone]    Attending physician's note   I have taken an interval history, reviewed the chart and examined the patient. I agree with the Advanced Practitioner's note, impression and recommendations. No further evaluation of cirrhosis is planned given her age and comorbidities. Anemia is a chronic problem and she is to receive IV Fe per Dr. Debby Bud. Chronic nausea  has improved on current regimen. GI signing off. Outpatient GI follow up prn. Outpatient PCP follow up with Dr. Debby Bud.  Venita Lick. Russella Dar, MD [facility]

## 2013-07-03 NOTE — Evaluation (Signed)
Occupational Therapy Evaluation Patient Details Name: [name] MRN: [id number] DOB: May 13, 1920 Today's Date: 07/03/2013 Time: 1030-1051 OT Time Calculation (min): 21 min  OT Assessment / Plan / Recommendation History of present illness [name] is a 79 y.o. female with a past medical history of severe mitral regurgitation, 3 vessel coronary artery disease, on medical management, hypertension, diabetes, history of atrial fibrillation, not on anticoagulation, history of anemia, who has had persistent nausea for the last many months. History was provided by daughter as patient refused to answer questions due to feeling tired. Nausea has been evaluated by her primary care physician as well as by gastroenterology. Apparently, there was a plan to do endoscopy in the near future. However, the nausea apparently got worse in the last few days. Patient's family spoke to her primary care physician and he was trying to facilitate a visit with the gastroenterologist. However, her symptoms got worse on Thursday, and she presented to the emergency department. She underwent a CT scan and was managed symptomatically and was discharged back to her assisted living facility. She was being observed there today when she was noted to have bleeding per rectum. It was small quantity but she did have 2-3 episodes of the same. Patient admits to black stools, but this has been on and off for the last many months. This has been attributed in the past to Iron tablets that she takes for anemia. Patient however, denies any vomiting. Denies any fever or chills. No abdominal pain. She does feel achy all over. Denies any weight loss over the last 6 months to one year. In the emergency department she was found to have severe hyperkalemia. Hemoglobin is at baseline   Clinical Impression   Pt with overall deconditioning and decreased activity tolerance. She transferred to [facility] and back to bed. Had been up with nursing tech to  chair and had just returned back to bed when OT arrived. She will benefit from skilled OT services to maximize ADL independence for next venue.    OT Assessment  Patient needs continued OT Services    Follow Up Recommendations  SNF;Supervision/Assistance - 24 hour    Barriers to Discharge      Equipment Recommendations  3 in 1 bedside comode    Recommendations for Other Services    Frequency  Min 2X/week    Precautions / Restrictions Precautions Precautions: Fall Restrictions Weight Bearing Restrictions: No   Pertinent Vitals/Pain No complaint of pain "just weak"    ADL  Eating/Feeding: Simulated;Independent Where Assessed - Eating/Feeding: Bed level Grooming: Simulated;Wash/dry hands;Set up Where Assessed - Grooming: Supine, head of bed up Upper Body Bathing: Simulated;Chest;Right arm;Left arm;Abdomen;Minimal assistance Where Assessed - Upper Body Bathing: Unsupported sitting Lower Body Bathing: Simulated;Maximal assistance Where Assessed - Lower Body Bathing: Supported sit to stand Upper Body Dressing: Simulated;Minimal assistance Where Assessed - Upper Body Dressing: Unsupported sitting Lower Body Dressing: Simulated;Maximal assistance Where Assessed - Lower Body Dressing: Supported sit to stand Toilet Transfer: Performed;Moderate assistance Toilet Transfer Method: Stand pivot Toilet Transfer Equipment: Bedside commode Toileting - Clothing Manipulation and Hygiene: Performed;Minimal assistance Where Assessed - Toileting Clothing Manipulation and Hygiene: Sit on 3-in-1 or toilet ADL Comments: Pt states she feels weak and acknowledges her change in functional ability. Pt agreeable to pivot to [facility] for BM (loose) and back to bed as she had just returned from sitting in chair with nursing tech. Limited activity tolerance noted. She states she is usually able to don socks but today was unable  to reach down to feet to don gripper socks. Needs support in standing for all standing  ADL.     OT Diagnosis: Generalized weakness  OT Problem List: Decreased strength;Decreased activity tolerance;Decreased knowledge of use of DME or AE OT Treatment Interventions: Self-care/ADL training;DME and/or AE instruction;Therapeutic activities;Patient/family education   OT Goals(Current goals can be found in the care plan section) Acute Rehab OT Goals Patient Stated Goal: wants to get stronger OT Goal Formulation: With patient Time For Goal Achievement: 07/17/13 Potential to Achieve Goals: Good  Visit Information  Last OT Received On: 07/03/13 Assistance Needed: +1 History of Present Illness: [name] is a 79 y.o. female with a past medical history of severe mitral regurgitation, 3 vessel coronary artery disease, on medical management, hypertension, diabetes, history of atrial fibrillation, not on anticoagulation, history of anemia, who has had persistent nausea for the last many months. History was provided by daughter as patient refused to answer questions due to feeling tired. Nausea has been evaluated by her primary care physician as well as by gastroenterology. Apparently, there was a plan to do endoscopy in the near future. However, the nausea apparently got worse in the last few days. Patient's family spoke to her primary care physician and he was trying to facilitate a visit with the gastroenterologist. However, her symptoms got worse on Thursday, and she presented to the emergency department. She underwent a CT scan and was managed symptomatically and was discharged back to her assisted living facility. She was being observed there today when she was noted to have bleeding per rectum. It was small quantity but she did have 2-3 episodes of the same. Patient admits to black stools, but this has been on and off for the last many months. This has been attributed in the past to Iron tablets that she takes for anemia. Patient however, denies any vomiting. Denies any fever or chills.  No abdominal pain. She does feel achy all over. Denies any weight loss over the last 6 months to one year. In the emergency department she was found to have severe hyperkalemia. Hemoglobin is at baseline       Prior Functioning     Home Living Family/patient expects to be discharged to:: Skilled nursing facility Prior Function Level of Independence: Independent Comments: pt states she walked without a device most of the time. occassionally she used a walker if she felt weak. She did own bathing, dressing. Walked to dining room for meals. Communication Communication: No difficulties         Vision/Perception     Cognition  Cognition Arousal/Alertness: Awake/alert Behavior During Therapy: WFL for tasks assessed/performed Overall Cognitive Status: Within Functional Limits for tasks assessed    Extremity/Trunk Assessment Upper Extremity Assessment Upper Extremity Assessment: Generalized weakness     Mobility Bed Mobility Bed Mobility: Supine to Sit;Sit to Supine Supine to Sit: 4: Min assist;HOB elevated;With rails Sit to Supine: 4: Min assist;HOB elevated Details for Bed Mobility Assistance: assist with trunk to upright to sit and assist for LEs to return to supine. Transfers Transfers: Sit to Stand;Stand to Sit Sit to Stand: 4: Min assist;With upper extremity assist;From bed;From chair/3-in-1 Stand to Sit: 4: Min assist;With upper extremity assist;To bed;To chair/3-in-1 Details for Transfer Assistance: verbal cues for hand placement. assist to rise and steady as well as control descent.     Exercise     Balance Balance Balance Assessed: Yes Static Standing Balance Static Standing - Level of Assistance: 4: Min assist  End of Session OT - End of Session Activity Tolerance: Patient limited by fatigue Patient left: in bed;with call bell/phone within reach;with bed alarm set  GO     Lennox Laity [phone] 07/03/2013, 11:41 AM

## 2013-07-04 ENCOUNTER — Inpatient Hospital Stay (HOSPITAL_COMMUNITY): Payer: Medicare Other

## 2013-07-04 LAB — CBC WITH DIFFERENTIAL/PLATELET
Basophils Absolute: 0 10*3/uL (ref 0.0–0.1)
Basophils Relative: 0 % (ref 0–1)
Eosinophils Absolute: 0 10*3/uL (ref 0.0–0.7)
HCT: 33.9 % — ABNORMAL LOW (ref 36.0–46.0)
Hemoglobin: 10.8 g/dL — ABNORMAL LOW (ref 12.0–15.0)
Lymphocytes Relative: 10 % — ABNORMAL LOW (ref 12–46)
Lymphs Abs: 0.6 10*3/uL — ABNORMAL LOW (ref 0.7–4.0)
MCH: 28.7 pg (ref 26.0–34.0)
MCHC: 31.9 g/dL (ref 30.0–36.0)
Monocytes Absolute: 0.7 10*3/uL (ref 0.1–1.0)
Monocytes Relative: 11 % (ref 3–12)
Neutro Abs: 4.7 10*3/uL (ref 1.7–7.7)
Neutrophils Relative %: 79 % — ABNORMAL HIGH (ref 43–77)
RBC: 3.76 MIL/uL — ABNORMAL LOW (ref 3.87–5.11)
WBC: 5.9 10*3/uL (ref 4.0–10.5)

## 2013-07-04 LAB — COMPREHENSIVE METABOLIC PANEL
BUN: 70 mg/dL — ABNORMAL HIGH (ref 6–23)
CO2: 29 mEq/L (ref 19–32)
Calcium: 9.7 mg/dL (ref 8.4–10.5)
Creatinine, Ser: 1.8 mg/dL — ABNORMAL HIGH (ref 0.50–1.10)
GFR calc Af Amer: 27 mL/min — ABNORMAL LOW (ref >90)
GFR calc non Af Amer: 23 mL/min — ABNORMAL LOW (ref >90)
Glucose, Bld: 106 mg/dL — ABNORMAL HIGH (ref 70–99)
Total Protein: 7 g/dL (ref 6.0–8.3)

## 2013-07-04 LAB — GLUCOSE, CAPILLARY
Glucose-Capillary: 98 mg/dL (ref 70–99)
Glucose-Capillary: 118 mg/dL — ABNORMAL HIGH (ref 70–99)
Glucose-Capillary: 106 mg/dL — ABNORMAL HIGH (ref 70–99)

## 2013-07-04 LAB — PRO B NATRIURETIC PEPTIDE: Pro B Natriuretic peptide (BNP): 2676 pg/mL — ABNORMAL HIGH (ref 0–450)

## 2013-07-04 MED ORDER — POTASSIUM CHLORIDE CRYS ER 20 MEQ PO TBCR
20.0000 meq | EXTENDED_RELEASE_TABLET | Freq: Three times a day (TID) | ORAL | Status: AC
  Administered 2013-07-04 – 2013-07-05 (×2): 20 meq via ORAL
  Filled 2013-07-04 (×3): qty 1

## 2013-07-04 MED ORDER — LORAZEPAM 0.5 MG PO TABS
0.5000 mg | ORAL_TABLET | Freq: Once | ORAL | Status: AC
  Administered 2013-07-04: 0.5 mg via ORAL
  Filled 2013-07-04: qty 1

## 2013-07-04 NOTE — Evaluation (Signed)
Physical Therapy Evaluation Patient Details Name: [name] MRN: [id number] DOB: 1919-12-27 Today's Date: 07/04/2013 Time: 4098-1191 PT Time Calculation (min): 19 min  PT Assessment / Plan / Recommendation History of Present Illness  [name] is a 79 y.o. female with a past medical history of severe mitral regurgitation, 3 vessel coronary artery disease, on medical management, hypertension, diabetes, history of atrial fibrillation, not on anticoagulation, history of anemia, who has had persistent nausea for the last many months.  She presented to the emergency department.with worsening nausea. Patient admits to black stools, but this has been on and off for the last many months. She does feel achy all over.  In the emergency department she was found to have severe hyperkalemia  Clinical Impression  Pt appeared uncomfortable in bed. Assisted her to sitting at the edge of the bed. Pt very lethargic, stating" I can't keep my eyes open". Pt will benefit from PT to  Address problems listed.  PT Assessment  Patient needs continued PT services    Follow Up Recommendations  SNF    Does the patient have the potential to tolerate intense rehabilitation      Barriers to Discharge        Equipment Recommendations  None recommended by PT    Recommendations for Other Services     Frequency Min 3X/week    Precautions / Restrictions Precautions Precautions: Fall Restrictions Weight Bearing Restrictions: No   Pertinent Vitals/Pain Reports her back hurts.repositioned in bed.      Mobility  Bed Mobility Supine to Sit: 4: Min assist;HOB elevated;With rails Sit to Supine: 3: Mod assist;HOB elevated Details for Bed Mobility Assistance: assist with trunk to upright to sit and assist for LEs to return to supine. Transfers Sit to Stand: 3: Mod assist;With upper extremity assist;From bed Stand to Sit: With upper extremity assist;4: Min assist;To bed Details for Transfer  Assistance: verbal cues for hand placement. pt somewhat lethargic, eyes closed often. max cues for technique/safety Ambulation/Gait Ambulation/Gait Assistance: 3: Mod assist Ambulation Distance (Feet): 3 Feet Assistive device: Rolling walker Ambulation/Gait Assistance Details: side stepping to head of bed. Gait Pattern: Step-to pattern;Trunk flexed;Decreased step length - right;Decreased stance time - right    Exercises     PT Diagnosis: Difficulty walking  PT Problem List: Decreased strength;Decreased activity tolerance;Decreased mobility;Decreased knowledge of use of DME;Decreased safety awareness;Decreased knowledge of precautions;Decreased cognition PT Treatment Interventions: DME instruction;Gait training;Patient/family education     PT Goals(Current goals can be found in the care plan section) Acute Rehab PT Goals Patient Stated Goal: wants to sit up PT Goal Formulation: Patient unable to participate in goal setting Time For Goal Achievement: 07/18/13  Visit Information  Last PT Received On: 07/04/13 Assistance Needed: +2 History of Present Illness: [name] is a 79 y.o. female with a past medical history of severe mitral regurgitation, 3 vessel coronary artery disease, on medical management, hypertension, diabetes, history of atrial fibrillation, not on anticoagulation, history of anemia, who has had persistent nausea for the last many months. History was provided by daughter as patient refused to answer questions due to feeling tired. Nausea has been evaluated by her primary care physician as well as by gastroenterology. Apparently, there was a plan to do endoscopy in the near future. However, the nausea apparently got worse in the last few days. Patient's family spoke to her primary care physician and he was trying to facilitate a visit with the gastroenterologist. However, her symptoms got worse on Thursday, and  she presented to the emergency department.with worsening  nausea. Patient admits to black stools, but this has been on and off for the last many months. She does feel achy all over.  In the emergency department she was found to have severe hyperkalemia       Prior Functioning  Home Living Family/patient expects to be discharged to:: Skilled nursing facility    Cognition  Cognition Arousal/Alertness: Lethargic Behavior During Therapy: WFL for tasks assessed/performed Overall Cognitive Status: Difficult to assess    Extremity/Trunk Assessment Lower Extremity Assessment Lower Extremity Assessment: Generalized weakness   Balance Balance Balance Assessed: Yes Static Sitting Balance Static Sitting - Balance Support: No upper extremity supported Static Sitting - Level of Assistance: 5: Stand by assistance Static Sitting - Comment/# of Minutes: x 5 minutes  End of Session PT - End of Session Activity Tolerance: Patient limited by fatigue;Patient limited by lethargy Patient left: in bed;with call bell/phone within reach;with bed alarm set  GP     Rada Hay 07/04/2013, 3:57 PM Blanchard Kelch PT [phone]

## 2013-07-04 NOTE — Progress Notes (Signed)
Pt c/o of abdominal pressure and of the need to urinate. Reeducated regarding her foley catheter, but pt is forgetful and unable to remember. Pt will repeatedly yell out for someone to come to her room. On call MD notified of pt's agitation and complaints of feeling the need to urinate; a one time dose of ativan was ordered and on call MD stated to keep the foley in at this time. Pt received the dose of ativan and agitation has minimally improved, but continues to c/o of the need to urinate. Will continue to monitor.

## 2013-07-04 NOTE — Progress Notes (Signed)
Clinical Social Work Department CLINICAL SOCIAL WORK PLACEMENT NOTE 07/04/2013  Patient:  [name], [name]  Account Number:  [id number] Admit date:  06/30/2013  Clinical Social Worker:  Jacelyn Grip  Date/time:  07/04/2013 12:10 PM  Clinical Social Work is seeking post-discharge placement for this patient at the following level of care:   SKILLED NURSING   (*CSW will update this form in Epic as items are completed)   07/04/2013  Patient/family provided with [facility] Department of Clinical Social Work's list of facilities offering this level of care within the geographic area requested by the patient (or if unable, by the patient's family).  07/04/2013  Patient/family informed of their freedom to choose among providers that offer the needed level of care, that participate in Medicare, Medicaid or managed care program needed by the patient, have an available bed and are willing to accept the patient.  07/04/2013  Patient/family informed of [facility]' ownership interest in [facility], as well as of the fact that they are under no obligation to receive care at this facility.  PASARR submitted to EDS on 07/04/2013 PASARR number received from EDS on 07/04/2013  FL2 transmitted to all facilities in geographic area requested by pt/family on  07/04/2013 FL2 transmitted to all facilities within larger geographic area on   Patient informed that his/her managed care company has contracts with or will negotiate with  certain facilities, including the following:     Patient/family informed of bed offers received:   Patient chooses bed at  Physician recommends and patient chooses bed at    Patient to be transferred to  on   Patient to be transferred to facility by   The following physician request were entered in Epic:   Additional Comments:   Jacklynn Lewis, MSW, [facility]  Clinical Social Worker Coverage for [employer] [phone]

## 2013-07-04 NOTE — Progress Notes (Signed)
MD paged regarding patient's potassium level.  No new orders received at this time.

## 2013-07-04 NOTE — Progress Notes (Signed)
Subjective: Reviewed notes: noted patients agitation and sensation of need to urinate. Also received report from RN of continued abdominal discomfort and nausea.   She is very somnolent this AM and hard to arouse. She states that she just doesn't feel well but denies abdominal pain.  Objective: Lab:  Recent Labs  07/02/13 0408  WBC 7.2  HGB 9.9*  HCT 31.2*  MCV 91.2  PLT 126*   No results found for this basename: NA, K, CL, CO3, GLUCOSE, BUN, CREATININE, CALCIUM, MG, PHOS,  in the last 72 hours  Imaging:  Scheduled Meds: . carvedilol  6.25 mg Oral BID WC  . cefUROXime  250 mg Oral BID WC  . clonazePAM  0.5 mg Oral QHS  . clonazePAM  0.5 mg Oral QHS  . digoxin  0.125 mg Oral Daily  . furosemide  40 mg Oral Daily  . insulin aspart  0-9 Units Subcutaneous Q6H  . insulin glargine  15 Units Subcutaneous QHS  . metoCLOPramide  5 mg Oral TID AC  . ondansetron  4 mg Oral Q12H  . pantoprazole  40 mg Oral Q0600  . polyethylene glycol  17 g Oral Daily  . sodium chloride  3 mL Intravenous Q12H   Continuous Infusions:  PRN Meds:.acetaminophen, acetaminophen, bisacodyl, LORazepam   Physical Exam: Filed Vitals:   07/04/13 0645  BP: 156/58  Pulse: 65  Temp: 97.9 F (36.6 C)  Resp: 18   gen'l- very elderly woman who is somnolent, does not appear to be in distres HEENT- [place]/AT Cor 1+ radial, quiet precordium, very loud, machinary III/VI holosystolic mm across precordium and back. Pulm - very shallow inspiration. MM masks breath sounds. No frank rales. Abd- protuberant, BS +, not tender.      Assessment/Plan: 1. GI - no change in condition: less nausea than at admission.  Plan  No changes in treatment or diet  2. Cardiac - total I/O -2,621 cc. No over signs of failure Plan D/c foley   Continue daily lasix.  3. Heme - stable Hgb. Has had iron infusion.  4. ID - UTI at admission. #4/4 rocephin, #2 ceftin  5. DM -  CBG (last 3)   Recent Labs  07/03/13 1143  07/03/13 1626 07/04/13 0755  GLUCAP 195* 113* 117*    Appears to be stable  6. Generalized weakness - [name] is not bouncing back as she usually does. No specific issue identified except she is not feeling well. Plan Lab: CMet, CBCD, TSH  F/u CXR 2 view     [employer] IM (o) [phone]; (c) [phone] Call-grp - [name] IM  Tele: [phone]  07/04/2013, 8:21 AM

## 2013-07-04 NOTE — Progress Notes (Signed)
Occupational Therapy Treatment Patient Details Name: CHETARA KROPP MRN: [id number] DOB: May 03, 1920 Today's Date: 07/04/2013 Time: 7829-5621 OT Time Calculation (min): 18 min  OT Assessment / Plan / Recommendation  History of present illness ALVENIA TREESE is a 79 y.o. female with a past medical history of severe mitral regurgitation, 3 vessel coronary artery disease, on medical management, hypertension, diabetes, history of atrial fibrillation, not on anticoagulation, history of anemia, who has had persistent nausea for the last many months. History was provided by daughter as patient refused to answer questions due to feeling tired. Nausea has been evaluated by her primary care physician as well as by gastroenterology. Apparently, there was a plan to do endoscopy in the near future. However, the nausea apparently got worse in the last few days. Patient's family spoke to her primary care physician and he was trying to facilitate a visit with the gastroenterologist. However, her symptoms got worse on Thursday, and she presented to the emergency department. She underwent a CT scan and was managed symptomatically and was discharged back to her assisted living facility. She was being observed there today when she was noted to have bleeding per rectum. It was small quantity but she did have 2-3 episodes of the same. Patient admits to black stools, but this has been on and off for the last many months. This has been attributed in the past to Iron tablets that she takes for anemia. Patient however, denies any vomiting. Denies any fever or chills. No abdominal pain. She does feel achy all over. Denies any weight loss over the last 6 months to one year. In the emergency department she was found to have severe hyperkalemia. Hemoglobin is at baseline   OT comments  Pt more lethargic today and had difficulty with pivot from [facility] to bed. Daughter present and is open to exploring SNF. Pt had her eyes closed often  during session. Feel she will likely need SNF at d/c.    Follow Up Recommendations  SNF;Supervision/Assistance - 24 hour    Barriers to Discharge       Equipment Recommendations  3 in 1 bedside comode    Recommendations for Other Services    Frequency Min 2X/week   Progress towards OT Goals Progress towards OT goals: Not progressing toward goals - comment (lethargic today)  Plan Discharge plan remains appropriate    Precautions / Restrictions Precautions Precautions: Fall Restrictions Weight Bearing Restrictions: No   Pertinent Vitals/Pain No complaint of    ADL  Toilet Transfer: Performed;Moderate assistance Toilet Transfer Method: Stand pivot Toilet Transfer Equipment: Bedside commode Toileting - Clothing Manipulation and Hygiene: Performed;Min guard Where Assessed - Engineer, mining and Hygiene: Sit on 3-in-1 or toilet ADL Comments: Pt on BSC with nursing when OT arrived. Assisted pt back to bed. Pt lethargic with eyes closed frequently. Pt with much difficulty standing without assistive device today. Tried walker but pt still  not alert enough to use walker well and required max cues for safety. Pt returned to supine and wanted a sip of water. Began coughing with a sip of water. Daughter states pt gets choked on cold water at home sometimes. Informed nursing of pt coughing on water. Daughter states she is open to SNF at d/c.     OT Diagnosis:    OT Problem List:   OT Treatment Interventions:     OT Goals(current goals can now be found in the care plan section)    Visit Information  Last OT Received On:  07/04/13 Assistance Needed: +1 History of Present Illness: LEIANN SPORER is a 79 y.o. female with a past medical history of severe mitral regurgitation, 3 vessel coronary artery disease, on medical management, hypertension, diabetes, history of atrial fibrillation, not on anticoagulation, history of anemia, who has had persistent nausea for the last many  months. History was provided by daughter as patient refused to answer questions due to feeling tired. Nausea has been evaluated by her primary care physician as well as by gastroenterology. Apparently, there was a plan to do endoscopy in the near future. However, the nausea apparently got worse in the last few days. Patient's family spoke to her primary care physician and he was trying to facilitate a visit with the gastroenterologist. However, her symptoms got worse on Thursday, and she presented to the emergency department. She underwent a CT scan and was managed symptomatically and was discharged back to her assisted living facility. She was being observed there today when she was noted to have bleeding per rectum. It was small quantity but she did have 2-3 episodes of the same. Patient admits to black stools, but this has been on and off for the last many months. This has been attributed in the past to Iron tablets that she takes for anemia. Patient however, denies any vomiting. Denies any fever or chills. No abdominal pain. She does feel achy all over. Denies any weight loss over the last 6 months to one year. In the emergency department she was found to have severe hyperkalemia. Hemoglobin is at baseline    Subjective Data      Prior Functioning       Cognition  Cognition Arousal/Alertness: Lethargic Behavior During Therapy: WFL for tasks assessed/performed Overall Cognitive Status: Difficult to assess (lethargic)    Mobility  Bed Mobility Sit to Supine: 3: Mod assist;HOB elevated Transfers Transfers: Sit to Stand;Stand to Sit Sit to Stand: 3: Mod assist;With upper extremity assist;From bed;From chair/3-in-1 Stand to Sit: With upper extremity assist;4: Min assist;To bed Details for Transfer Assistance: verbal cues for hand placement. pt somewhat lethargic, eyes closed often. max cues for technique/safety    Exercises      Balance     End of Session OT - End of Session Equipment  Utilized During Treatment: Rolling walker Activity Tolerance: Patient limited by lethargy Patient left: in bed;with call bell/phone within reach;with bed alarm set  GO     Lennox Laity [phone] 07/04/2013, 1:59 PM

## 2013-07-04 NOTE — Progress Notes (Signed)
CSW continuing to follow for disposition planning.   Pt admitted from [facility].   PT has not yet been able to assess pt due to pt pain, but OT assessed pt and recommended SNF.  CSW met with pt and pt daughter at bedside.  Pt daughter discussed that she recognizes pt functional decline currently, but would like to see how pt progresses in the next couple of days and does not want to completely rule out pt returning to [facility].  CSW discussed recommendation to explore option for SNF in the instance that pt continues to need increased assistance with activities of daily living. Pt daughter is agreeable to initiation of SNF search to [facility].   CSW initiated SNF search to [facility].  CSW to continue to follow to assist with disposition planning and pt disposition plan of returning to [facility] or going to SNF for rehab will be dependent on pt progress.  Jacklynn Lewis, MSW, LCSWA  Clinical Social Work Coverage for [employer], [place] [phone]

## 2013-07-05 ENCOUNTER — Encounter (HOSPITAL_COMMUNITY): Payer: Self-pay | Admitting: *Deleted

## 2013-07-05 DIAGNOSIS — R413 Other amnesia: Secondary | ICD-10-CM

## 2013-07-05 DIAGNOSIS — E876 Hypokalemia: Secondary | ICD-10-CM

## 2013-07-05 DIAGNOSIS — I2510 Atherosclerotic heart disease of native coronary artery without angina pectoris: Secondary | ICD-10-CM

## 2013-07-05 LAB — GLUCOSE, CAPILLARY
Glucose-Capillary: 62 mg/dL — ABNORMAL LOW (ref 70–99)
Glucose-Capillary: 90 mg/dL (ref 70–99)
Glucose-Capillary: 96 mg/dL (ref 70–99)
Glucose-Capillary: 69 mg/dL — ABNORMAL LOW (ref 70–99)
Glucose-Capillary: 118 mg/dL — ABNORMAL HIGH (ref 70–99)

## 2013-07-05 MED ORDER — ONDANSETRON HCL 4 MG/2ML IJ SOLN
4.0000 mg | Freq: Four times a day (QID) | INTRAMUSCULAR | Status: DC | PRN
Start: 2013-07-05 — End: 2013-07-06
  Administered 2013-07-06: 4 mg via INTRAMUSCULAR
  Filled 2013-07-05: qty 2

## 2013-07-05 MED ORDER — INSULIN GLARGINE 100 UNIT/ML ~~LOC~~ SOLN
10.0000 [IU] | Freq: Every day | SUBCUTANEOUS | Status: DC
  Administered 2013-07-05 – 2013-07-06 (×2): 10 [IU] via SUBCUTANEOUS
  Filled 2013-07-05 (×3): qty 0.1

## 2013-07-05 MED ORDER — SODIUM CHLORIDE 0.45 % IV SOLN
INTRAVENOUS | Status: DC
  Administered 2013-07-05 – 2013-07-08 (×7): via INTRAVENOUS

## 2013-07-05 NOTE — Progress Notes (Signed)
Hypoglycemic Event  CBG: 62  Treatment: 15 GM carbohydrate snack x 2  Symptoms: None  Follow-up CBG: Time: 6:51 AM, 7:21 AM CBG Result:62, 90  Possible Reasons for Event: Medication regimen, poor PO intake     Wilna Pennie, Estanislado Emms  Remember to initiate Hypoglycemia Order Set & complete

## 2013-07-05 NOTE — Progress Notes (Signed)
Hypoglycemic Event  CBG: 69  Treatment: 15 GM carbohydrate snack  Symptoms: None  Follow-up CBG: Time:1729 CBG Result:88  Possible Reasons for Event: Other: Poor PO intake. Minimal.  Comments/MD notified: Patient stable.     Kathrynn Humble D  Remember to initiate Hypoglycemia Order Set & complete

## 2013-07-05 NOTE — Progress Notes (Signed)
CSW met with patient's son at bedside re: discharge plans - PT recommended SNF at discharge. Patient's son requested [facility] - CSW confirmed with Wille Celeste @ [facility] that they will have a bed available for patient Friday if she is ready for discharge. CSW will follow-up Friday.   Dr. Debby Bud - please sign FL2 on shadow chart in [facility].   Clinical Social Work Department CLINICAL SOCIAL WORK PLACEMENT NOTE 07/05/2013  Patient:  CHARMIKA, MACDONNELL  Account Number:  [id number] Admit date:  06/30/2013  Clinical Social Worker:  Jacelyn Grip  Date/time:  07/04/2013 12:10 PM  Clinical Social Work is seeking post-discharge placement for this patient at the following level of care:   SKILLED NURSING   (*CSW will update this form in Epic as items are completed)   07/04/2013  Patient/family provided with [facility] Department of Clinical Social Work's list of facilities offering this level of care within the geographic area requested by the patient (or if unable, by the patient's family).  07/04/2013  Patient/family informed of their freedom to choose among providers that offer the needed level of care, that participate in Medicare, Medicaid or managed care program needed by the patient, have an available bed and are willing to accept the patient.  07/04/2013  Patient/family informed of [facility]' ownership interest in [facility], as well as of the fact that they are under no obligation to receive care at this facility.  PASARR submitted to EDS on 07/04/2013 PASARR number received from EDS on 07/04/2013  FL2 transmitted to all facilities in geographic area requested by pt/family on  07/04/2013 FL2 transmitted to all facilities within larger geographic area on   Patient informed that his/her managed care company has contracts with or will negotiate with  certain facilities, including the following:     Patient/family informed of bed offers received:  07/05/2013 Patient  chooses bed at [facility] Physician recommends and patient chooses bed at    Patient to be transferred to [facility] on   Patient to be transferred to facility by   The following physician request were entered in Epic:   Additional Comments:   Unice Bailey, LCSW [facility] Clinical Social Worker cell #: [phone]

## 2013-07-05 NOTE — Progress Notes (Signed)
Spoke with md griffin stated to give pt klonopin now for agitation.

## 2013-07-05 NOTE — Progress Notes (Signed)
Subjective: Mrs. Picazo is calling for help due to nausea. She has not had emesis but feels sick. She has not had any other complaints and no report of events. She is awake.  Objective: Lab:  Recent Labs  07/04/13 0900  WBC 5.9  NEUTROABS 4.7  HGB 10.8*  HCT 33.9*  MCV 90.2  PLT 142*    Recent Labs  07/04/13 0900  NA 135  K 3.2*  CL 93*  GLUCOSE 106*  BUN 70*  CREATININE 1.80*  CALCIUM 9.7    BNP  2,676 (5,485)  Imaging: 11/25 CXR IMPRESSION:  The findings are consistent with slight interval improvement in  pulmonary interstitial edema secondary to CHF.  Scheduled Meds: . carvedilol  6.25 mg Oral BID WC  . cefUROXime  250 mg Oral BID WC  . clonazePAM  0.5 mg Oral QHS  . clonazePAM  0.5 mg Oral QHS  . digoxin  0.125 mg Oral Daily  . furosemide  40 mg Oral Daily  . insulin aspart  0-9 Units Subcutaneous Q6H  . insulin glargine  15 Units Subcutaneous QHS  . metoCLOPramide  5 mg Oral TID AC  . ondansetron  4 mg Oral Q12H  . pantoprazole  40 mg Oral Q0600  . polyethylene glycol  17 g Oral Daily  . potassium chloride  20 mEq Oral TID  . sodium chloride  3 mL Intravenous Q12H   Continuous Infusions:  PRN Meds:.acetaminophen, acetaminophen, bisacodyl, LORazepam   Physical Exam: Filed Vitals:   07/05/13 0300  BP: 129/52  Pulse: 60  Temp: 97 F (36.1 C)  Resp: 20    Intake/Output Summary (Last 24 hours) at 07/05/13 0815 Last data filed at 07/05/13 0802  Gross per 24 hour  Intake     60 ml  Output    502 ml  Net   -442 ml   Total this adm: -3,064  Gen'l- elderly woman who is uncomfortable with nausea HEENT- C&S clear Cor  2+ radial pulse, Regular, diffuse III/VI mm Pulm - obscurred breath sounds, no frank wheezing, question of rales. Increased WOB, using abdominal muscles. Abd - protruberant, BS +,  No guarding or rebound Neuro - Awake, answers questions     Assessment/Plan: 1. GI - no evidence of any bleed. Continues to have nausea as primary  complaint. On anti=emetics. GI has no further suggestions. Plan Continue anti-emetics.  2. Cardiac - she has diuresed 3 liters; BNP has come down. She is still working to breath.  Plan Continue present medications.  3. Heme - completed iron infusion. Hgb 11/15 10.8 g  4. ID - UTI #4/4 rocephin  #3 ceftin  U cx reveals ss to this product  5. DM -  CBG (last 3)   Recent Labs  07/05/13 0617 07/05/13 0651 07/05/13 0721  GLUCAP 62* 62* 90    Has had concentrated sweets for low CBG. She was not symptomatic.   Plan Will reduce lantus to 10 units  Continue ss  6. Generalized weakness - not making progress . TSH normal. Creatinine rising with elevated BUN. Nutritional markers are low: Total bil 1.9, Albumin 3.1. She may be a bit dry  Plan Gentle IV hydration with close follow up of fluid status.   Dispo:  SNF when medically stable.    Illene Regulus [place] IM (o) [phone]; (c) [phone] Call-grp - [name] IM  Tele: [phone]  07/05/2013, 8:11 AM

## 2013-07-05 NOTE — Progress Notes (Signed)
Pt with increased agitation. Attempting to get out of bed. Called md awaiting call back. Will continue to monitor.

## 2013-07-06 ENCOUNTER — Inpatient Hospital Stay (HOSPITAL_COMMUNITY): Payer: Medicare Other

## 2013-07-06 DIAGNOSIS — Z515 Encounter for palliative care: Secondary | ICD-10-CM

## 2013-07-06 DIAGNOSIS — R110 Nausea: Secondary | ICD-10-CM

## 2013-07-06 LAB — BASIC METABOLIC PANEL
CO2: 30 mEq/L (ref 19–32)
Calcium: 9.8 mg/dL (ref 8.4–10.5)
Creatinine, Ser: 1.53 mg/dL — ABNORMAL HIGH (ref 0.50–1.10)
GFR calc Af Amer: 33 mL/min — ABNORMAL LOW (ref >90)
GFR calc non Af Amer: 29 mL/min — ABNORMAL LOW (ref >90)
Potassium: 3.6 mEq/L (ref 3.5–5.1)

## 2013-07-06 LAB — GLUCOSE, CAPILLARY
Glucose-Capillary: 103 mg/dL — ABNORMAL HIGH (ref 70–99)
Glucose-Capillary: 57 mg/dL — ABNORMAL LOW (ref 70–99)
Glucose-Capillary: 98 mg/dL (ref 70–99)

## 2013-07-06 MED ORDER — FUROSEMIDE 10 MG/ML IJ SOLN
40.0000 mg | Freq: Four times a day (QID) | INTRAMUSCULAR | Status: AC
  Administered 2013-07-06 – 2013-07-07 (×3): 40 mg via INTRAVENOUS
  Filled 2013-07-06 (×3): qty 4

## 2013-07-06 MED ORDER — POLYETHYLENE GLYCOL 3350 17 G PO PACK
17.0000 g | PACK | Freq: Every day | ORAL | Status: DC | PRN
  Filled 2013-07-06: qty 1

## 2013-07-06 MED ORDER — ONDANSETRON HCL 4 MG/2ML IJ SOLN
4.0000 mg | Freq: Four times a day (QID) | INTRAMUSCULAR | Status: DC | PRN
Start: 2013-07-06 — End: 2013-07-11

## 2013-07-06 MED ORDER — LORAZEPAM 2 MG/ML IJ SOLN
0.2500 mg | Freq: Four times a day (QID) | INTRAMUSCULAR | Status: DC
  Administered 2013-07-06 – 2013-07-07 (×3): 0.25 mg via INTRAVENOUS
  Filled 2013-07-06 (×3): qty 1

## 2013-07-06 MED ORDER — METOCLOPRAMIDE HCL 5 MG/ML IJ SOLN
10.0000 mg | Freq: Three times a day (TID) | INTRAMUSCULAR | Status: DC
  Administered 2013-07-06 – 2013-07-11 (×15): 10 mg via INTRAVENOUS
  Filled 2013-07-06 (×20): qty 2

## 2013-07-06 MED ORDER — ONDANSETRON 8 MG PO TBDP
8.0000 mg | ORAL_TABLET | Freq: Three times a day (TID) | ORAL | Status: DC
  Administered 2013-07-06 – 2013-07-08 (×5): 8 mg via ORAL
  Filled 2013-07-06 (×12): qty 1

## 2013-07-06 NOTE — Progress Notes (Signed)
Spoke with MD griffin no new orders will continue to monitor.

## 2013-07-06 NOTE — Progress Notes (Signed)
Pt with cbg of 56 md paged awaiting call back.hypoglycemic protocol started. Pt able to take po fluids and oj. Will recheck cbg.

## 2013-07-06 NOTE — Progress Notes (Signed)
Subjective: Per RN report patient continues to have refractory nausea. It is noted that she does have improvement after clonazepam or lorazepam.Her PO intake is still.  Patient is awake, moaning and uncomfortable, does not open her eyes but does converse: she states she feels bad.   Objective: Lab:  Recent Labs  07/04/13 0900  WBC 5.9  NEUTROABS 4.7  HGB 10.8*  HCT 33.9*  MCV 90.2  PLT 142*    Recent Labs  07/04/13 0900 07/06/13 0423  NA 135 139  K 3.2* 3.6  CL 93* 97  GLUCOSE 106* 62*  BUN 70* 71*  CREATININE 1.80* 1.53*  CALCIUM 9.7 9.8    Imaging: 11/25 CXR 2 view: IMPRESSION:  The findings are consistent with slight interval improvement in  pulmonary interstitial edema secondary to CHF  Scheduled Meds: . carvedilol  6.25 mg Oral BID WC  . cefUROXime  250 mg Oral BID WC  . clonazePAM  0.5 mg Oral QHS  . clonazePAM  0.5 mg Oral QHS  . digoxin  0.125 mg Oral Daily  . furosemide  40 mg Oral Daily  . insulin aspart  0-9 Units Subcutaneous Q6H  . insulin glargine  10 Units Subcutaneous QHS  . metoCLOPramide  5 mg Oral TID AC  . ondansetron  4 mg Oral Q12H  . pantoprazole  40 mg Oral Q0600  . polyethylene glycol  17 g Oral Daily  . sodium chloride  3 mL Intravenous Q12H   Continuous Infusions: . sodium chloride 50 mL/hr at 07/06/13 0426   PRN Meds:.acetaminophen, acetaminophen, bisacodyl, LORazepam, ondansetron   Physical Exam: Filed Vitals:   07/06/13 0530  BP: 133/67  Pulse: 68  Temp: 97.4 F (36.3 C)  Resp: 18    Intake/Output Summary (Last 24 hours) at 07/06/13 1058 Last data filed at 07/06/13 8119  Gross per 24 hour  Intake 1328.33 ml  Output    650 ml  Net 678.33 ml   I/o total this adm: -2,385  Wt Readings from Last 3 Encounters:  07/06/13 135 lb 9.3 oz (61.5 kg)  07/06/13 135 lb 9.3 oz (61.5 kg)  05/08/13 127 lb 12.8 oz (57.97 kg)   Gen'l - very elderly and miserable woman HEENT- normal Cor - 2+ radial, III/VI holosystolic  murmur radiates to back, no JVD when laying flat. Pulm - using abdominal accessory muscles for respiration. Breath sounds difficult to appreciate over loud murmur. Abd - in between respirations soft, non-tender Neuro - semi obtunded but will talk brief statement. Derm - no breakdown,       Assessment/Plan: 1. GI - continued refractory nausea. Discussed with Dr. Russella Dar.  Plan Change zofran to ODT 8 mg q 8,  Change ativan to q 6  Continue reglan  Palliative care consult for additional suggestions for symptom management  2. Cardiac - mild increased WOB w/o overt signs of failure BNP had come down. Plan F/u portable chest x-ray  BNP in AM  Continue lasix  3. Heme - Hgb has been stable Plan H/H in AM   4. ID - UTI  #4/4 rocephin, #3 ceftin - total of 7 days Plan D/c antibtiotics  F/u U/A  5. DM -  CBG (last 3)   Recent Labs  07/06/13 0545 07/06/13 0629 07/06/13 0749  GLUCAP 57* 103* 118*    Continue basal insulin and sliding scale  6. Generalized weakness - Labs were OK, CXR 11/25 was better.  7. Code status - discussed at length with son, POA. Mrs. Tuckett  lacks insight and ability to make reasonable decision in this regard.  Plan DNR   Illene Regulus [place] IM (o) [phone]; (c) [phone] Call-grp - [name] IM  Tele: [phone]  07/06/2013, 10:19 AM

## 2013-07-06 NOTE — Consult Note (Addendum)
Palliative Medicine Team at [facility]  Date: 07/06/2013   Patient Name: [name]  DOB: 06-29-1920  MRN: [id number]  Age / Sex: 79 y.o., female   PCP: Jacques Navy, MD Referring Physician: Jacques Navy, MD  HPI/Reason for Consultation: [name] is a 79 yo woman with severe multivessel CAD, Diabetes, Portal hypertension, and CHF who has been struggling with persistent nausea for at least the last 6 months. She has been seen in the outpatient setting for her nausea by Dr. Russella Dar GI and Dr. Arthur Holms her PCP who have eliminated common culprits for her nausea such as her medications, GERD, and structural issues but her nausea has continued despite interventions. She was admitted to the hospital on 11/21 with severe nausea and a question of hematachezia. She has a low Hb at baseline due to MDS and is on oral iron. She has mild constipation but has had multiple stools during her admission. On admission she had hyperkalemia and UTI. Her nausea is predominately worse in the AM, there is no vomiting. She has been noted to have some episodes of hypoglycemia during this admission-especially in the AM. Despite her UTI being treated she still complained of urinary problems and abdominal pressure. She has newly documented Cirrhosis on imaging and elevated bilirubin.  Participants in Discussion: Son [name] [phone]  3. Assessment/Plan: Problem List of Possible contributing factors to Nausea:  1. CAD, nausea could be an anginal equivalent, I reviewed her EKG and on 11/20 her EKG has a right axis and TWI in the inferior leads which could indicate some right heart ischemia and give nausea symptoms. Noted her Troponins are negative. BNP elevated. 2. Renal Insufficiency with elevated BUN, now 71, concerning for possible upper GI bleeding among many other causes, EGD does not suggest active bleeding 3. DM, had hypoglycemia which could contribute to AM nausea 4. Cirrhosis, elevated bilirubin,  cholestasis contributing as well, low level ascites could be causing abdominal pressure 5. Elevated TSH, mildy high, hypothyroidism 6. Pancreatic lesion with interval enlargement 7. UTI, had WBC pyuria on admission urine, I don't see micro results for the urine obtained on 11/21 her persistent confusion/delirum, urinary complaints may also indicate that she has resistant or recurrent infection, she has had a foley this admission and it has been removed, she is incontinent of urine. Notes indicate she completed appropriate treatment of this UTI.  8. Gastritis, noted on EGD done 11/23  Overall [name] has a multitude of possible underlying conditions that could cause her nausea-unfortunately many of them are not reversible so the only option moving forward is to try to control this symptom as much as possible. On my visit today she is quite sedated from having a bad morning, she is requiring sedating anti-nausea meds including low dose ativan, klonopin, previously getting phenergan. Additionally, she was on oral reglan and scheduled ODT Zofran without significant relief. She is also agitated and confused with delirium which complicates her treatment plan greatly.  Recommendations:  1. Reglan to high dose IV 10mg  q8 hours (previously getting 5mg  PO TID) 2. Consider using low dose Haldol or Olanzapine instead of the scheduled Ativan for favorable dopamine antagonism which will help with her nausea and help with her agitation. If these are not successful can do trial of Compazine. Suggested dose of Haldol is 0.5mg  IV q6 commonly used in palliative care setting. If these interventions fail and her mental status improves we could try low dose decadron as well. For consistent dosing of  5HT antagonist could use [place] Patch (Gansitron) for transdermal delivery. 3. Continue to explore etiology: ?urine micro results, liver/bili issues, hypoglycemia, correct her hypokalemia, hydration 4. If [name] continues  to decline and require sedating anti-nausea meds for her comfort she may benefit from Hospice services at discharge, she potentially has numerous qualifying diagnoses along with her advanced age. I did not initiate this discussion with her son, but he is in complete agreement with her comfort and QOL being primary goals.  Thank you for this consultation. We will continue to follow closely and help with her symptom management.     Time: 70 minutes Greater than 50%  of this time was spent counseling and coordinating care related to the above assessment and plan.  Signed by: Edsel Petrin, DO  07/06/2013, 9:35 PM  Please contact Palliative Medicine Team phone at [phone] for questions and concerns.

## 2013-07-07 ENCOUNTER — Inpatient Hospital Stay (HOSPITAL_COMMUNITY): Payer: Medicare Other

## 2013-07-07 DIAGNOSIS — I5043 Acute on chronic combined systolic (congestive) and diastolic (congestive) heart failure: Secondary | ICD-10-CM

## 2013-07-07 DIAGNOSIS — Z515 Encounter for palliative care: Secondary | ICD-10-CM

## 2013-07-07 LAB — HEMOGLOBIN AND HEMATOCRIT, BLOOD
HCT: 34.4 % — ABNORMAL LOW (ref 36.0–46.0)
Hemoglobin: 10.7 g/dL — ABNORMAL LOW (ref 12.0–15.0)

## 2013-07-07 LAB — URINALYSIS W MICROSCOPIC + REFLEX CULTURE
Bilirubin Urine: NEGATIVE
Hgb urine dipstick: NEGATIVE
Ketones, ur: NEGATIVE mg/dL
Leukocytes, UA: NEGATIVE
Nitrite: NEGATIVE
Protein, ur: 30 mg/dL — AB
Urobilinogen, UA: 1 mg/dL (ref 0.0–1.0)

## 2013-07-07 LAB — BASIC METABOLIC PANEL
CO2: 29 mEq/L (ref 19–32)
Calcium: 9.8 mg/dL (ref 8.4–10.5)
Chloride: 99 mEq/L (ref 96–112)
Creatinine, Ser: 1.75 mg/dL — ABNORMAL HIGH (ref 0.50–1.10)
Glucose, Bld: 75 mg/dL (ref 70–99)
Sodium: 142 mEq/L (ref 135–145)

## 2013-07-07 LAB — GLUCOSE, CAPILLARY
Glucose-Capillary: 100 mg/dL — ABNORMAL HIGH (ref 70–99)
Glucose-Capillary: 154 mg/dL — ABNORMAL HIGH (ref 70–99)
Glucose-Capillary: 49 mg/dL — ABNORMAL LOW (ref 70–99)
Glucose-Capillary: 55 mg/dL — ABNORMAL LOW (ref 70–99)
Glucose-Capillary: 67 mg/dL — ABNORMAL LOW (ref 70–99)
Glucose-Capillary: 77 mg/dL (ref 70–99)

## 2013-07-07 LAB — PRO B NATRIURETIC PEPTIDE: Pro B Natriuretic peptide (BNP): 4078 pg/mL — ABNORMAL HIGH (ref 0–450)

## 2013-07-07 MED ORDER — DEXTROSE 50 % IV SOLN
INTRAVENOUS | Status: AC
  Administered 2013-07-07: 16:00:00 25 mL
  Filled 2013-07-07: qty 50

## 2013-07-07 MED ORDER — DEXTROSE 50 % IV SOLN
25.0000 mL | Freq: Once | INTRAVENOUS | Status: AC | PRN
  Administered 2013-07-07: 25 mL via INTRAVENOUS

## 2013-07-07 MED ORDER — POTASSIUM CHLORIDE CRYS ER 20 MEQ PO TBCR
30.0000 meq | EXTENDED_RELEASE_TABLET | Freq: Once | ORAL | Status: DC
  Filled 2013-07-07: qty 1

## 2013-07-07 MED ORDER — DEXTROSE 50 % IV SOLN
INTRAVENOUS | Status: AC
  Filled 2013-07-07: qty 50

## 2013-07-07 MED ORDER — HALOPERIDOL LACTATE 5 MG/ML IJ SOLN
0.5000 mg | Freq: Four times a day (QID) | INTRAMUSCULAR | Status: DC
  Administered 2013-07-07 – 2013-07-11 (×9): 0.5 mg via INTRAVENOUS
  Filled 2013-07-07: qty 1
  Filled 2013-07-07 (×9): qty 0.1
  Filled 2013-07-07 (×3): qty 1
  Filled 2013-07-07 (×2): qty 0.1
  Filled 2013-07-07: qty 1
  Filled 2013-07-07 (×5): qty 0.1

## 2013-07-07 NOTE — Progress Notes (Signed)
CSW continues to follow for discharge planning. Patient has a bed @ [facility] for rehab if needed. Note Dr. Debby Bud to speak with family regarding possible hospice care - please consult weekend CSW if residential hospice is recommended.   Unice Bailey, LCSW [facility] Clinical Social Worker cell #: [phone]

## 2013-07-07 NOTE — Progress Notes (Signed)
Noted pt does not moan or yell out when staff is in the room.  NAD as soon as we step in room. IT is as if part of the acting out is b/c she just doesn't like to be alone.  No nausea noted this shift. Had a BM and multiple incont urines

## 2013-07-07 NOTE — Progress Notes (Signed)
Physical Therapy Treatment Patient Details Name: [name] MRN: [id number] DOB: 07/19/1920 Today's Date: 07/07/2013 Time: 4098-1191 PT Time Calculation (min): 23 min  PT Assessment / Plan / Recommendation  History of Present Illness [name] is a 79 y.o. female with a past medical history of severe mitral regurgitation, 3 vessel coronary artery disease, on medical management, hypertension, diabetes, history of atrial fibrillation, not on anticoagulation, history of anemia, who has had persistent nausea for the last many months. History was provided by daughter as patient refused to answer questions due to feeling tired. Nausea has been evaluated by her primary care physician as well as by gastroenterology. Apparently, there was a plan to do endoscopy in the near future. However, the nausea apparently got worse in the last few days. Patient's family spoke to her primary care physician and he was trying to facilitate a visit with the gastroenterologist. However, her symptoms got worse on Thursday, and she presented to the emergency department.with worsening nausea. Patient admits to black stools, but this has been on and off for the last many months. She does feel achy all over.  In the emergency department she was found to have severe hyperkalemia   PT Comments   Increased assistance needed this session. Pt able to stand briefly but unable to weightshift or take any lateral steps. Incontinent of bladder during attempt to stand.   Follow Up Recommendations  SNF     Does the patient have the potential to tolerate intense rehabilitation     Barriers to Discharge        Equipment Recommendations  None recommended by PT    Recommendations for Other Services    Frequency Min 3X/week   Progress towards PT Goals Progress towards PT goals: Not progressing toward goals - comment (increased assistance required this session. Pt unable to ambulate)  Plan Current plan remains appropriate     Precautions / Restrictions Precautions Precautions: Fall Restrictions Weight Bearing Restrictions: No   Pertinent Vitals/Pain No c/o pain    Mobility  Bed Mobility Bed Mobility: Rolling Right;Rolling Left Rolling Right: 3: Mod assist Rolling Left: 3: Mod assist Supine to Sit: 2: Max assist;HOB elevated Sit to Supine: 2: Max assist;HOB flat Details for Bed Mobility Assistance: Assist for trunk and bil LEs. Max encouragement for pt to attempt to perform task.  Transfers Transfers: Sit to Stand;Stand to Sit Sit to Stand: 1: +2 Total assist;From bed;From elevated surface Sit to Stand: Patient Percentage: 30% Stand to Sit: 1: +2 Total assist Stand to Sit: Patient Percentage: 30% Details for Transfer Assistance: Max encouragment for pt to attempt to perform task. Assist to rise, stabilize, control descent. Pt able to weightbear (with R knee flexed) but unable to weightshift or take lateral steps. Eyes remained closed entire time. Pt incontinent of uring during standing Ambulation/Gait Ambulation/Gait Assistance: Not tested (comment) Ambulation/Gait Assistance Details: unable to safely attempt    Exercises     PT Diagnosis:    PT Problem List:   PT Treatment Interventions:     PT Goals (current goals can now be found in the care plan section)    Visit Information  Last PT Received On: 07/07/13 Assistance Needed: +2 History of Present Illness: [name] is a 79 y.o. female with a past medical history of severe mitral regurgitation, 3 vessel coronary artery disease, on medical management, hypertension, diabetes, history of atrial fibrillation, not on anticoagulation, history of anemia, who has had persistent nausea for the last many months.  History was provided by daughter as patient refused to answer questions due to feeling tired. Nausea has been evaluated by her primary care physician as well as by gastroenterology. Apparently, there was a plan to do endoscopy in the near  future. However, the nausea apparently got worse in the last few days. Patient's family spoke to her primary care physician and he was trying to facilitate a visit with the gastroenterologist. However, her symptoms got worse on Thursday, and she presented to the emergency department.with worsening nausea. Patient admits to black stools, but this has been on and off for the last many months. She does feel achy all over.  In the emergency department she was found to have severe hyperkalemia    Subjective Data      Cognition  Cognition Arousal/Alertness: Lethargic Overall Cognitive Status: Difficult to assess    Balance     End of Session PT - End of Session Activity Tolerance: Patient limited by fatigue;Patient limited by lethargy Patient left: in bed;with call bell/phone within reach;with bed alarm set   GP     Rebeca Alert, MPT Pager: [phone]

## 2013-07-07 NOTE — Progress Notes (Addendum)
Patient [name] [name]      DOB: 01-20-1920      VHQ:[id number]   Palliative Medicine Team at [facility] Progress Note    Subjective:Patient quietly vocalizing. Continuously changing position as if uncomfortable. Son is at the bedside. Abdomen firm and distended. Patient unable to open eyes. Patient unable to vocalize and answer questions or follow commands. Updated Dr. Debby Bud regarding low CBG.  Decision to discontinue Insulin made.  Bladder scan completed showing 170 ml.     Filed Vitals:   07/07/13 1400  BP: 148/70  Pulse:   Temp:   Resp:    Physical exam: Gen.: delirious, unable to follow commands agitated, pupils can be visualized secondary to closed lids Chest decreased but clear ainterior  cardiovascular: regular rate and rhythm posive S1. S2 Abdomen: distended firm does not appear to be guarding but does feel comfortable when I palpate extremity: warm no mottling Neuro-: confused able to follow commands  Lab Results  Component Value Date   CREATININE 1.75* 07/07/2013   BUN 70* 07/07/2013   NA 142 07/07/2013   K 3.8 07/07/2013   CL 99 07/07/2013   CO2 29 07/07/2013    Assessment and plan: 79 year old white female with nausea of unclear etiology, delirium, decreased oral intake secondary to all the above. Patient appears seriously ill. Family aware that death is a possibility.  1. Do not resuscitate 2. Nausea: unable to elicit accurate information continue to use low dose scheduled haldol which will assist with delirium. 3. Delirium continue low dose Haldol. 4. UTI continue antibiotics. 5. Distended abdomen unclear etiology. Bladder scan shows 170 mL. Consider Internet catheterization if necessary. 6. Hypoglycemia: discontinue Lantus and sliding scale insulin prognosis poor death a possibility.   Total time 35 minutes Ermine Stebbins L. Ladona Ridgel, MD MBA The Palliative Medicine Team at [facility] Phone: [phone] Pager: [phone]

## 2013-07-07 NOTE — Progress Notes (Signed)
Subjective: Dr. Lamar Blinks help is appreciated. Will initiate recommendations.  [name] does not respond to verbal stimuli. She is calling out at times. Does not appear to be in acute distress  Objective: Lab:  Recent Labs  07/04/13 0900 07/07/13 0345  WBC 5.9  --   NEUTROABS 4.7  --   HGB 10.8* 10.7*  HCT 33.9* 34.4*  MCV 90.2  --   PLT 142*  --     Recent Labs  07/04/13 0900 07/06/13 0423 07/07/13 0345  NA 135 139 142  K 3.2* 3.6 3.8  CL 93* 97 99  GLUCOSE 106* 62* 75  BUN 70* 71* 70*  CREATININE 1.80* 1.53* 1.75*  CALCIUM 9.7 9.8 9.8   BNP 5484 to 2676 to 4078  Imaging: 11/27 CXR: IMPRESSION:  Central mild congestion and mild interstitial prominence suspicious  for mild interstitial edema. Persistent bilateral small pleural  effusion with bilateral basilar atelectasis  Scheduled Meds: . carvedilol  6.25 mg Oral BID WC  . clonazePAM  0.5 mg Oral QHS  . dextrose      . digoxin  0.125 mg Oral Daily  . insulin aspart  0-9 Units Subcutaneous Q6H  . insulin glargine  10 Units Subcutaneous QHS  . LORazepam  0.25 mg Intravenous Q6H  . metoCLOPramide (REGLAN) injection  10 mg Intravenous Q8H  . ondansetron  8 mg Oral Q8H  . pantoprazole  40 mg Oral Q0600  . sodium chloride  3 mL Intravenous Q12H   Continuous Infusions: . sodium chloride 50 mL/hr at 07/06/13 2238   PRN Meds:.acetaminophen, acetaminophen, bisacodyl, ondansetron, polyethylene glycol   Physical Exam: Filed Vitals:   07/07/13 0537  BP: 150/64  Pulse: 66  Temp: 98.2 F (36.8 C)  Resp: 20   Gen'l - Elderly woman who appears chronically ill but not in acute distress HEENT- -won't open her eyes. Cor - IRIR rate controlled, III/VI holosystolic mm., No JVD Pulm - increased WOB - short, shallow inspirations w/ abdominal musculature in play. Abd - absent to hypoactive BS, intermittent muscle flexing. No true guarding or rebound Neuro - obtunded.    Assessment/Plan: 1. GI - continued c/o  nausea. Abd not tender but hypoactive BS. Recent CT with GB wall thickening. Reviewed U/S abd 3/11 - no stones, GB wall thickening. Acalculous cholecystitis may be a cause of her symptoms. Dr. Lamar Blinks note reviewed.   Plan Agree with increase in Reglan to 10 mg IV q8  Will d/c ativan and start haloperidol 0.5 mg IV q6  Abd u/s and if not helpful HIDDA scan  Will check cath urine for infection  2. Cardiac - CXR with evidence of some pulmonary edema. BNP has increased. Lasix IV was initiated for 3 doses.  Wt Readings from Last 3 Encounters:  07/07/13 137 lb 5.6 oz (62.3 kg)  07/07/13 137 lb 5.6 oz (62.3 kg)  05/08/13 127 lb 12.8 oz (57.97 kg)   Plan Will monitor daily weights  3. Heme - stable  4. ID  Full course of antibiotics for UTI completed.   Plan F/u cath U/A with plans to follow  5. DM -  CBG (last 3)   Recent Labs  07/06/13 1632 07/07/13 0729 07/07/13 0754  GLUCAP 98 55* 154*    Episode of hypoglycemia noted. Plan con't low dose basal insulin and ss.  6. Generalized weakness and electrolytes: K 3.8 Plan Add daily po Potassium.  7. End of life care - Mrs. Shor continues to fail: weaker and uncomfortable. Currently DNR/DNI,  no pressors. If she does not improve over the next several days she may need to be considered for hospice care. Will talk with family over the weekend.   Illene Regulus [place] IM (o) [phone]; (c) [phone] Call-grp - [name] IM  Tele: [phone]  07/07/2013, 8:46 AM

## 2013-07-08 DIAGNOSIS — R404 Transient alteration of awareness: Secondary | ICD-10-CM

## 2013-07-08 DIAGNOSIS — E162 Hypoglycemia, unspecified: Secondary | ICD-10-CM

## 2013-07-08 LAB — URINE CULTURE

## 2013-07-08 LAB — GLUCOSE, CAPILLARY
Glucose-Capillary: 105 mg/dL — ABNORMAL HIGH (ref 70–99)
Glucose-Capillary: 123 mg/dL — ABNORMAL HIGH (ref 70–99)
Glucose-Capillary: 74 mg/dL (ref 70–99)
Glucose-Capillary: 79 mg/dL (ref 70–99)
Glucose-Capillary: 87 mg/dL (ref 70–99)

## 2013-07-08 MED ORDER — MORPHINE SULFATE 2 MG/ML IJ SOLN
2.0000 mg | INTRAMUSCULAR | Status: DC | PRN
  Administered 2013-07-08 – 2013-07-11 (×8): 2 mg via INTRAVENOUS
  Filled 2013-07-08 (×8): qty 1

## 2013-07-08 MED ORDER — HYDRALAZINE HCL 20 MG/ML IJ SOLN: 10.0000 mg | INTRAMUSCULAR | Status: DC | PRN

## 2013-07-08 NOTE — Progress Notes (Signed)
Pt with cbg of 67. Hypoglycemic protocol initiated. md called awaiting call back. Pt no with a cbg of 123. Will continue to monitor.

## 2013-07-08 NOTE — Progress Notes (Signed)
Patient ZO:XWRUEAV AISSATOU FRONCZAK      DOB: 1920-02-06      WUJ:[id number]  Stopped by to offer emotional support to daughter. Patient resting slightly better than last evening.  She opens her eyes today and looks at me.  Daughter reiterates plan to meet with Dr. Arthur Holms in the am.  I am available if I can help in any way.  Daksha Koone L. Ladona Ridgel, MD MBA The Palliative Medicine Team at [facility] Phone: [phone] Pager: [phone]

## 2013-07-08 NOTE — Progress Notes (Signed)
Subjective: Per Dr. Lubertha [name] note and RN report [name] has been less responsive/more obtunded. She has not appeared to be agitated or c/o nausea. Insulin was stopped 2/2 repeat episodes of hypoglycemia. Ativan was stopped and haloperidol started - holding dosing 2/2 somnolence and lack of complaint. Bladder scan with 170 cc/ She has been incontinent.   Patient does seem to be aware of examiner and mouths a few words. Does not seem to be in pain.     Objective: Lab:  Recent Labs  07/07/13 0345  HGB 10.7*  HCT 34.4*  last WBC 5.9   Recent Labs  07/06/13 0423 07/07/13 0345  NA 139 142  K 3.6 3.8  CL 97 99  GLUCOSE 62* 75  BUN 71* 70*  CREATININE 1.53* 1.75*  CALCIUM 9.8 9.8   U/A 11/28 - negative.  Imaging: Abd U/S 11/28- IMPRESSION:  Stable gallbladder wall thickening in this patient with a history of  cirrhosis. No gallstones although a tiny cholesterol polyp is  visualized.  Ascites.  Increased renal cortical echogenicity suggest medical renal disease.  Scheduled Meds: . carvedilol  6.25 mg Oral BID WC  . clonazePAM  0.5 mg Oral QHS  . digoxin  0.125 mg Oral Daily  . haloperidol lactate  0.5 mg Intravenous Q6H  . metoCLOPramide (REGLAN) injection  10 mg Intravenous Q8H  . ondansetron  8 mg Oral Q8H  . pantoprazole  40 mg Oral Q0600  . potassium chloride  30 mEq Oral Once  . sodium chloride  3 mL Intravenous Q12H   Continuous Infusions: . sodium chloride 50 mL/hr at 07/08/13 0421   PRN Meds:.acetaminophen, acetaminophen, bisacodyl, ondansetron, polyethylene glycol   Physical Exam: Filed Vitals:   07/08/13 0527  BP: 181/65  Pulse: 79  Temp:   Resp:    Gen'l- very elderly woman who is obtunded HEENT- - she will not open her eyes Cor - 2+ radial pulse, IRIR rate controlled, III/VI mm best at apex and anterior axillary line Pulm - mild increased WOB, no wheezing Abd - rare BS, mildly protuberant, no guarding.  Neuro - barely  arousable.      Assessment/Plan: 1. GI - no change in exam but per RN no patient c/o nausea overnight. US GB negative. U/A negative. Plan Continue comfort care with zofran, haldol as needed for severe nausea and agitation (hold for obtundation)  2. Cardiac - No new lab or xray. Urine output limited  3. Heme - Hgb stable  4. ID  UA negative. No evidence of other infection. Plan D/c'd antibiotics.  5. DM - lantus stopped due to several episodes hypoglycemia CBG (last 3)   Recent Labs  07/08/13 0026 07/08/13 0655 07/08/13 0738  GLUCAP 123* 74 80    6. F/E/N - negligible intake. Progressive rise in BUN and creatinine. Bladder scan just done w/ >700 ccs -  Plan Foley catheter  Bmet in AM  7. End of life care - Long chat with [name] about her poor prognosis and the heroic nature of continue IV hydration. Discussed her poor prognosis even with improvement given her multiple co morbidities and age.  He will talk with his sister. We will meet in AM to discuss goals of care, i.e. Moving to supportive care only w/o heroic measures. (20 + minute conversation).   [name] IM (o) [phone]; (c) [phone] Call-grp - [name] IM  Tele: [phone]  07/08/2013, 9:27 AM

## 2013-07-09 LAB — GLUCOSE, CAPILLARY: Glucose-Capillary: 115 mg/dL — ABNORMAL HIGH (ref 70–99)

## 2013-07-09 NOTE — Progress Notes (Signed)
Subjective: Barely arousable - opens her eyes. Does moan at times.   Objective: Lab:  Recent Labs  07/07/13 0345  HGB 10.7*  HCT 34.4*    Recent Labs  07/07/13 0345  NA 142  K 3.8  CL 99  GLUCOSE 75  BUN 70*  CREATININE 1.75*  CALCIUM 9.8    Imaging:  Scheduled Meds: . clonazePAM  0.5 mg Oral QHS  . haloperidol lactate  0.5 mg Intravenous Q6H  . metoCLOPramide (REGLAN) injection  10 mg Intravenous Q8H  . ondansetron  8 mg Oral Q8H  . pantoprazole  40 mg Oral Q0600  . potassium chloride  30 mEq Oral Once  . sodium chloride  3 mL Intravenous Q12H   Continuous Infusions: . sodium chloride 50 mL/hr at 07/08/13 2230   PRN Meds:.acetaminophen, acetaminophen, bisacodyl, hydrALAZINE, morphine injection, ondansetron, polyethylene glycol   Physical Exam: Filed Vitals:   07/09/13 0601  BP: 132/53  Pulse: 69  Temp: 97.8 F (36.6 C)  Resp: 13   No respiratory distress Cor - RRR Abd - distended, not tender.      Assessment/Plan: Problems 1-6 without change. 7. End of life care - discussed goals of care and end of life care with Son and Daughter: they are comfortable with comfort care only; d/c IVF, Continue meds for comfort stopping all else. No labs, no CBGs, continue IV beyond expiration.    Illene Regulus [place] IM (o) [phone]; (c) [phone] Call-grp - [name] IM  Tele: [phone]  07/09/2013, 10:38 AM

## 2013-07-10 NOTE — Progress Notes (Addendum)
Patient MV:HQIONGE NALANI ANDREEN      DOB: 04-02-1920      XBM:[id number]  Noted conversion to GIP in the am.  Will sign off.  Happy to help in any way we can. Just call.   Kharma Sampsel L. Ladona Ridgel, MD MBA The Palliative Medicine Team at [facility] Phone: [phone] Pager: [phone]

## 2013-07-10 NOTE — Progress Notes (Signed)
Subjective: Overnight, no acute events. Patient is unarousable this morning.  After discussion with family yesterday, comfort measures were initiated.   Objective: Labs stopped. Comfort care initiated.   Imaging: None  Scheduled Meds: . haloperidol lactate  0.5 mg Intravenous Q6H  . metoCLOPramide (REGLAN) injection  10 mg Intravenous Q8H  . sodium chloride  3 mL Intravenous Q12H   Continuous Infusions:  PRN Meds:.acetaminophen, acetaminophen, bisacodyl, morphine injection, ondansetron   Physical Exam: Filed Vitals:   07/10/13 0509  BP: 118/31  Pulse: 55  Temp: 97.1 F (36.2 C)  Resp: 19   Gen: Ill appearing woman, who is unresponsive.  HEENT: Will not open her eyes CV: Blowing, harsh III/VI holosystolic murmur best heard at apex, 2+ dorsalis pedis and radial pulses Pulm: Lungs clear to auscultation bilaterally. No wheezing or rales. Slight increased work of breathing.  Abd: No guarding. Decreased bowel sounds.  Neuro: Unarousable. Spontaneous movements of limbs.     Assessment/Plan:  Mrs. Miles Costain is a 79 yo female with PMH of severe mitral regurgitation, HTN, diabetes, atrial fibrillation not on coagulation who presented with persistent nausea and is now obtunded. After discussion, comfort care measures were initiated yesterday.  1.  GI - Plan Continue comfort care with zofran, haldol as needed for severe nausea and agitation (hold for obtundation)   2. Cardiac - No new lab or xray. Urine output limited   3. Heme - Hgb stable   4. ID UA negative. No evidence of other infection. Discontinued antibiotics  5. DM - lantus stopped due to several episodes hypoglycemia on the 28th CBG (last 3)   Recent Labs  07/08/13 1735 07/08/13 2208 07/09/13 0746  GLUCAP 87 105* 115*    6. F/E/N -  Foley catheter in place.  Bmet in AM    7. End of life care - Patient is DNR. After lengthy discussion with family, comfort care measures were initiated. IVF were discontinued.  Continue drugs that are necessary for comfort.   Ave Filter MSIII  07/10/2013, 8:20 AM

## 2013-07-10 NOTE — Progress Notes (Signed)
Nutrition Brief Note  Chart reviewed. Pt now transitioning to comfort care.  No further nutrition interventions warranted at this time.  Please re-consult as needed.   Tawsha Terrero Hawkins RD, LDN   

## 2013-07-10 NOTE — Progress Notes (Signed)
[facility]) Social Work  Met with patient's son/POA Tammy Sours to complete registration paperwork in anticipation of hospice admission tomorrow morning. Family requesting Dr. Debby Bud to attend, very appreciative of his care of her for many years. Will follow up with admitting in am. RNCM Cookie aware patient has been approved and will proceed in am. Spoke with unit staff regarding this process. Thank you. Forrestine Him, LCSW [phone]

## 2013-07-10 NOTE — Progress Notes (Signed)
[facility] requested to evaluate for GIP status as inpatient by CM Cookie and SW Forrestine Him. Pt approved as eligible for GIP with diagnosis of heart disease NOS 429.90 by Medical Director Elliot Gurney MD. Dr. Debby Bud will be attending under re-admit.   Pt will be followed by SW Forrestine Him and RN Chalmers Cater.  Please call [facility] @ [phone] with any hospice needs.  Thank you Chalmers Cater, RN   [phone]

## 2013-07-11 ENCOUNTER — Encounter (HOSPITAL_COMMUNITY): Payer: Self-pay

## 2013-07-11 ENCOUNTER — Inpatient Hospital Stay (HOSPITAL_COMMUNITY)
Admission: RE | Admit: 2013-07-11 | Discharge: 2013-08-10 | DRG: 812 | Disposition: E | Source: Hospice | Attending: Internal Medicine | Admitting: Internal Medicine

## 2013-07-11 DIAGNOSIS — N390 Urinary tract infection, site not specified: Secondary | ICD-10-CM | POA: Diagnosis present

## 2013-07-11 DIAGNOSIS — J45909 Unspecified asthma, uncomplicated: Secondary | ICD-10-CM | POA: Diagnosis present

## 2013-07-11 DIAGNOSIS — K7460 Unspecified cirrhosis of liver: Secondary | ICD-10-CM | POA: Diagnosis present

## 2013-07-11 DIAGNOSIS — I10 Essential (primary) hypertension: Secondary | ICD-10-CM | POA: Diagnosis present

## 2013-07-11 DIAGNOSIS — I509 Heart failure, unspecified: Secondary | ICD-10-CM | POA: Diagnosis present

## 2013-07-11 DIAGNOSIS — Z96649 Presence of unspecified artificial hip joint: Secondary | ICD-10-CM

## 2013-07-11 DIAGNOSIS — K5520 Angiodysplasia of colon without hemorrhage: Secondary | ICD-10-CM | POA: Diagnosis present

## 2013-07-11 DIAGNOSIS — R339 Retention of urine, unspecified: Secondary | ICD-10-CM | POA: Diagnosis present

## 2013-07-11 DIAGNOSIS — R110 Nausea: Secondary | ICD-10-CM | POA: Diagnosis present

## 2013-07-11 DIAGNOSIS — R112 Nausea with vomiting, unspecified: Secondary | ICD-10-CM | POA: Diagnosis present

## 2013-07-11 DIAGNOSIS — Z807 Family history of other malignant neoplasms of lymphoid, hematopoietic and related tissues: Secondary | ICD-10-CM

## 2013-07-11 DIAGNOSIS — Z515 Encounter for palliative care: Secondary | ICD-10-CM

## 2013-07-11 DIAGNOSIS — I5032 Chronic diastolic (congestive) heart failure: Secondary | ICD-10-CM | POA: Diagnosis present

## 2013-07-11 DIAGNOSIS — Z87442 Personal history of urinary calculi: Secondary | ICD-10-CM

## 2013-07-11 DIAGNOSIS — D469 Myelodysplastic syndrome, unspecified: Principal | ICD-10-CM | POA: Diagnosis present

## 2013-07-11 DIAGNOSIS — I059 Rheumatic mitral valve disease, unspecified: Secondary | ICD-10-CM | POA: Diagnosis present

## 2013-07-11 DIAGNOSIS — Z8249 Family history of ischemic heart disease and other diseases of the circulatory system: Secondary | ICD-10-CM

## 2013-07-11 DIAGNOSIS — E785 Hyperlipidemia, unspecified: Secondary | ICD-10-CM | POA: Diagnosis present

## 2013-07-11 DIAGNOSIS — Z860100 Personal history of colon polyps, unspecified: Secondary | ICD-10-CM

## 2013-07-11 DIAGNOSIS — I4891 Unspecified atrial fibrillation: Secondary | ICD-10-CM | POA: Diagnosis present

## 2013-07-11 DIAGNOSIS — I2510 Atherosclerotic heart disease of native coronary artery without angina pectoris: Secondary | ICD-10-CM | POA: Diagnosis present

## 2013-07-11 DIAGNOSIS — E1169 Type 2 diabetes mellitus with other specified complication: Secondary | ICD-10-CM | POA: Diagnosis present

## 2013-07-11 MED ORDER — SODIUM CHLORIDE 0.9 % IJ SOLN: 3.0000 mL | INTRAMUSCULAR | Status: DC | PRN

## 2013-07-11 MED ORDER — CLONAZEPAM 0.5 MG PO TABS: 0.5000 mg | ORAL_TABLET | Freq: Every day | ORAL | Status: DC

## 2013-07-11 MED ORDER — MORPHINE SULFATE 2 MG/ML IJ SOLN: 2.0000 mg | INTRAMUSCULAR | Status: DC | PRN

## 2013-07-11 MED ORDER — HALOPERIDOL LACTATE 5 MG/ML IJ SOLN: 0.5000 mg | Freq: Four times a day (QID) | INTRAMUSCULAR | Status: DC

## 2013-07-11 MED ORDER — MORPHINE SULFATE 2 MG/ML IJ SOLN: 2.0000 mg | INTRAMUSCULAR | Status: AC | PRN

## 2013-07-11 MED ORDER — SODIUM CHLORIDE 0.9 % IV SOLN: 250.0000 mL | INTRAVENOUS | Status: DC | PRN

## 2013-07-11 MED ORDER — METOCLOPRAMIDE HCL 5 MG/ML IJ SOLN: 10.0000 mg | Freq: Three times a day (TID) | INTRAMUSCULAR | Status: DC

## 2013-07-11 MED ORDER — METOCLOPRAMIDE HCL 5 MG/ML IJ SOLN: 10.0000 mg | Freq: Three times a day (TID) | INTRAMUSCULAR | Status: AC

## 2013-07-11 MED ORDER — ONDANSETRON HCL 4 MG/2ML IJ SOLN: 4.0000 mg | Freq: Four times a day (QID) | INTRAMUSCULAR | Status: AC | PRN

## 2013-07-11 MED ORDER — HALOPERIDOL LACTATE 5 MG/ML IJ SOLN: 0.5000 mg | Freq: Four times a day (QID) | INTRAMUSCULAR | Status: AC

## 2013-07-11 MED ORDER — SODIUM CHLORIDE 0.9 % IJ SOLN: 3.0000 mL | Freq: Two times a day (BID) | INTRAMUSCULAR | Status: DC

## 2013-07-11 MED ORDER — ONDANSETRON HCL 4 MG/2ML IJ SOLN: 4.0000 mg | Freq: Four times a day (QID) | INTRAMUSCULAR | Status: DC | PRN

## 2013-07-12 ENCOUNTER — Telehealth: Payer: Self-pay | Admitting: Internal Medicine

## 2013-07-12 NOTE — Discharge Summary (Signed)
NAMEDORINE, DUFFEY NO.:  [id number]  MEDICAL RECORD NO.:  [id number]  LOCATION:  1432                         FACILITY:  [facility]  PHYSICIAN:  Rosalyn Gess. Norins, MD  DATE OF BIRTH:  [birth year]  DATE OF ADMISSION:  06/30/2013 DATE OF DISCHARGE:  08/06/2013                              DISCHARGE SUMMARY   ADMITTING DIAGNOSES: 1. Refractory nausea and vomiting. 2. Myelodysplastic syndrome with chronic anemia. 3. Failure to thrive. 4. History of atrial fibrillation. 5. Diabetes mellitus. 6. History of urinary tract infection. 7. Hepatic cirrhosis. 8. Severe mitral valve disease.  DISCHARGE DIAGNOSES: 1. Refractory nausea and vomiting. 2. Myelodysplastic syndrome with chronic anemia. 3. Failure to thrive. 4. History of atrial fibrillation. 5. Diabetes mellitus. 6. History of urinary tract infection. 7. Hepatic cirrhosis. 8. Severe mitral valve disease.  CONSULTANTS:  Rachael Fee, MD for Gastroenterology, Dr. Phillips Odor and Dr. Ladona Ridgel for Palliative Care.  PROCEDURES:  Imaging:   1. CT of the abdomen and pelvis, June 29, 2013, which showed interval enlargement of complex multiloculated cystic mass associated with the tail of the pancreas when compared to the study of 2011 along with mild ascites, nonspecific mild gallbladder wall thickening and  Extensive atherosclerotic vascular disease small pleural effusions right greater than left and cardiomegaly.  Advanced left hip osteoarthritis noted incidentally and prior surgical changes at L3 through L5, posterior lumbar interbody fusion, and right total hip arthroplasty.   2. portable chest x-ray, June 30, 2013, which showed enlargement of the cardiac silhouette with pulmonary vascular congestion and probable mild pulmonary edema.   3.two-view of the chest, July 02, 2013, with stable cardiomegaly and mild interstitial edema.  Increased small bilateral pleural effusions and bibasilar atelectasis.    4. chest x-ray, July 04, 2013, with findings that are consistent with slight interval improvement in pulmonary interstitial edema.   5. portable chest x-ray, November 27, which showed a central mild congestion and mild interstitial prominence suspicious for mild interstitial edema.  Persistent bilateral small pleural effusions with bibasilar atelectasis.   6. ultrasound of the abdomen, July 07, 2013, which showed stable gallbladder wall thickening with a history of cirrhosis.  No gallstones, although a tiny cholesterol polyp was visualized.  Ascites is noted.  Increased renal cortical echogenicity suggestive of medical renal disease.  PROCEDURES:  GI 1. upper endoscopy July 02, 2013, which revealed mild nonspecific gastritis.  Biopsies were sent to lab and returned as negative for any H. pylori or other problems.  There was typical-appearing mild portal gastropathy throughout the mid and proximal stomach.  There was no esophageal or gastric varices.  HISTORY OF PRESENT ILLNESS:  Ms. Easton is a 79 year old woman with past medical history of severe mitral regurgitation, 3-vessel coronary artery disease, hypertension, diabetes, atrial fibrillation not on anticoagulation, history of chronic anemia secondary to myelodysplastic syndrome, and AVMs.  History provided by the patient's daughter since the patient was not able to talk and give a history.  For the past many months, the patient has been followed for chronic nausea, seen by both primary care and Gastroenterology.  No identifiable cause has been identified.  The patient's condition has been deteriorating with poor p.o. intake and  chronic discomfort.  Because of her decline, she has been transferred from assisted living to a skilled care facility.  She continued to decline with poor p.o. intake and ongoing nausea; and therefore presented to the emergency department for evaluation.  CT scan of the abdomen was  unremarkable.  There was a history of the patient having some mild bleeding per rectum, reported as 2-3 episodes.  The patient's family reported dark stools, but she has been on iron.  The patient has had no vomiting.  She denied any fevers or chills.  She had no abdominal pain.  She did have diffuse myalgias.  For deteriorating condition and reported hematochezia, she was admitted to the hospital. Of note, the patient was found to have urinary tract infection at admission.  Please see the H and P for past medical history, family history, social history, and medications.  HOSPITAL COURSE: 1. GI.  The patient with persistent nausea.  Evaluation including EGD     was unremarkable with no etiology determined for her discomfort and     nausea.  Culprit drugs were stopped, i.e. Metformin and Iron. She was given IV iron replacement. The patient was treated with Zofran.  Due to persistent     symptoms, she was put on scheduled Zofran as well as Ativan to     control both anxiety and nausea.  After consultation by Dr.     Phillips Odor, Ativan was discontinued and Haldol was substituted.  The     patient continued to have abdominal distention and nausea with poor     p.o. intake. 2. Cardiac.  The patient with mild congestive heart failure on     admission.  She was diuresed with multiple doses of IV Lasix.  She     denied any respiratory distress and did not seem overly     decompensated in this regard.  The patient has severe mitral valve     disease and 3-vessel coronary disease.  She was not considered a     candidate for any procedural intervention, and has been     managed medically.  The patient's blood pressure would dip at times     during this hospital admission, but she remained cardiac stable. 3. Hematology.  The patient with a history of myelodysplastic syndrome     with chronic anemia.  She did have reports of mild hematochezia,     but there was no repeat hematochezia during  hospitalization.  Her hemoglobin did     remain stable. 4. ID.  The patient did have urinalysis at admission.  She was treated     with antibiotics and had a followup UA that was negative. 5. Diabetes.  The patient had been treated as an outpatient with     Lantus and metformin.  Permissive treatment was the plan so that     she would avoid hypoglycemia.  During this hospitalization, the     patient was continued on Lantus, but metformin was discontinued and     sliding scale was used.  The patient did have multiple episodes of     hypoglycemia resulting in the discontinuation of Lantus with     sliding scale coverage only. 6. GU/fluids, electrolytes, and nutrition.  The patient did have a     problem with urinary retention.  She had initially been given a     Foley catheter, but because of discomfort and agitation, this was     removed.  The patient had recurrent  urinary retention and Foley     catheter was reinserted for comfort measures.  Renal function was     followed and remained stable. 7. End-of-life care.  The patient is very old with multiple medical     problems.  She has failed to respond to therapy, there has been no     etiology determined for her chronic nausea.  Her p.o. intake     continued to be very poor.  The patient's level of consciousness     deteriorated, so that she was basically unresponsive for the last     several days of her admission.  Lengthy family discussion was held     with the daughter and son, and it was felt the patient would be     best served by comfort care only.  After this decision was made,     the patient was taken off IV fluids.  Medications were continued     for comfort care including Zofran, haloperidol, and morphine for     pain.  At the time of this dictation, the patient is for comfort     care only, and her condition is premorbid.  She is being discharged     for readmission under general inpatient hospice care.  PHYSICAL  EXAMINATION:  Temperature was 98.4, blood pressure 106/30, heart rate was 86, respirations 16.  The patient was examined on December 1st.  This is a generally ill-appearing woman who is unresponsive and would not open her eyes.  She had no labored respirations, did not seem to be in distress.  Cardiovascular exam revealed a blowing harsh 3/6 holosystolic murmur heard best at the apex,with radiation to the axillary line and through to the back to the scapular area.  She had normal dorsalis pedis pulse.  Auscultation of the lungs bilaterally revealed no wheezing or rales.  The patient did have increased work of breathing using abdominal musculature.  Abdomen was protuberant.  She has hypoactive bowel sounds.  There is no guarding or rebound.  Neuro, the patient was unarousable, but did have spontaneous limb movement.  DISPOSITION:  The patient to be converted to GIP status.  We will continue comfort care with Zofran and Haldol, and morphine as needed. Her prognosis is terminal.      Rosalyn Gess. Norins, MD     MEN/MEDQ  D:  07-24-13  T:  07/25/2013  Job:  [id number]

## 2013-07-12 NOTE — Telephone Encounter (Signed)
Lucile Crater called from Hospice stated taht [name] pass away on 2013/07/19 @ 8:05 am.

## 2013-08-10 NOTE — Progress Notes (Signed)
Chaplain responded to referral by nursing and hospice re: pt death.  Provided grief / emotional support and prayers with family at bedside.  Will follow for continued support as needed.   Belva Crome MDiv

## 2013-08-10 NOTE — Progress Notes (Signed)
Patient rested through the night, alert when stimulated but does not speak.  Patient's extremities are still warm to touch but patient's O2 levels have dropped down to 86% on 3 L and BP this morning was 106/30.  Morphine given for comfort and will continue to monitor and report any changes.  Ernesta Amble, RN

## 2013-08-10 NOTE — H&P (Signed)
[name] is an 80 y.o. female.   Chief Complaint: End of life care  HPI: Mrs. Rodger has had a declining course for months with as diminishing quality of life. Recently she was relocated to SNF. She was recently admitted for refractory nausea. Full evaluation was unrevealing. She has had a decline in mental status to the point of being non-communicative and responding to harsh stimulus only. After long family discussion on goals of care she was deemed to be for comfort care only: no heroic support, i.e. Tube feeding or IV hydration. She is in the active process of dying.    Past Medical History  Diagnosis Date  . Inguinal hernia   . Severe mitral regurgitation     a. 12/2011 Echo: Sev MR with flail leaflet.  . Other and unspecified hyperlipidemia   . Pneumonia   . Osteoarthrosis, unspecified whether generalized or localized, unspecified site   . Unspecified essential hypertension   . Type II or unspecified type diabetes mellitus without mention of complication, not stated as uncontrolled   . Coronary atherosclerosis of unspecified type of vessel, native or graft     a. 11/2007 Cath: 3vd, sev MR ->felt to be poor surgical candidate->Med Rx  . Chronic diastolic CHF (congestive heart failure)     a. 12/2011 Echo: EF 60-65%, No rwma, Mild AI, Sev MR w/ flail segment of ant mv leflet, massive bi-atrial enlargement, Mod TR, PASP 86.  . Atrial fibrillation     a. chronic - no longer on anticoagulation.  Marland Kitchen Unspecified asthma(493.90)   . Anemia, unspecified   . Allergic rhinitis, cause unspecified   . Adenomatous colon polyp 10/2009  . GIB (gastrointestinal bleeding)     2009  . Nephrolithiasis     Past Surgical History  Procedure Laterality Date  . Breast cyst aspiration      x50  . Abdominal hysterectomy  1990  . Lumbar fusion  2007    [facility]  . Total hip arthroplasty  01/2007    Right - Dr Valentina Gu  . Inguinal hernia repair  2010    [facility]  . Esophagogastroduodenoscopy N/A  07/02/2013    Procedure: ESOPHAGOGASTRODUODENOSCOPY (EGD);  Surgeon: Rachael Fee, MD;  Location: [facility];  Service: Endoscopy;  Laterality: N/A;    Family History  Problem Relation Age of Onset  . Heart disease Father     CHF  . Lymphoma Sister   . Colon cancer Neg Hx    Social History:  reports that she has never smoked. She has never used smokeless tobacco. She reports that she does not drink alcohol or use illicit drugs.  Allergies:  Allergies  Allergen Reactions  . Codeine     REACTION: Hives    Medications Prior to Admission  Medication Sig Dispense Refill  . clonazePAM (KLONOPIN) 0.5 MG tablet Take 0.5 mg by mouth at bedtime. For anxiety      . haloperidol lactate (HALDOL) 5 MG/ML injection Inject 0.1 mLs (0.5 mg total) into the vein every 6 (six) hours.  1 mL    . metoCLOPramide (REGLAN) 5 MG/ML injection Inject 2 mLs (10 mg total) into the vein every 8 (eight) hours.  2 mL  0  . morphine 2 MG/ML injection Inject 1 mL (2 mg total) into the vein every 3 (three) hours as needed.  1 mL  0  . ondansetron (ZOFRAN) 4 MG/2ML SOLN injection Inject 2 mLs (4 mg total) into the vein every 6 (six) hours as needed for  nausea or vomiting.  2 mL  0    No results found for this or any previous visit (from the past 48 hour(s)). No results found.  Review of Systems  Constitutional: Positive for weight loss and malaise/fatigue. Negative for fever and chills.  HENT: Negative for congestion, nosebleeds and tinnitus.   Eyes: Negative for blurred vision, double vision, photophobia and pain.  Respiratory: Negative for cough, hemoptysis, shortness of breath and wheezing.   Cardiovascular: Positive for leg swelling. Negative for chest pain and palpitations.  Gastrointestinal: Positive for nausea and abdominal pain. Negative for heartburn and constipation.  Genitourinary:       Urinary retention  Musculoskeletal: Positive for back pain and myalgias.  Skin: Negative.   Neurological:  Positive for sensory change and weakness. Negative for headaches.  Endo/Heme/Allergies: Negative.   Psychiatric/Behavioral: Negative.     There were no vitals taken for this visit. Physical Exam  There were no vitals filed for this visit. Exam (07/10/13) Gen'l elderly woman who is not responsive HEENT - [place]/AT Cor - 2+ radial, IRIR controlled rate, III/VI mm best at apex, radiates to back Pulm - normal respirations Abd - protuberant, hypoactive BS, no guarding or rebound Neuro - obtunded.  Assessment/Plan End of life Care - Mrs. Shook is for supportive care only. She will be continued on anti-emetics and medication for agitation and restlessness as well a pain.  Prognosis is terminal.   Illene Regulus 07/20/2013, 8:21 AM

## 2013-08-10 NOTE — Progress Notes (Signed)
Room [facility] 1432 - Gara Kroner - [facility] RN Visit-R.Mical Kicklighter RN  GIP status admission to [facility] diagnosis of heart disease.  Pt is DNR code.    Pt obtunded, lying in bed, appears comfortable.    No family present.   Pt admitted this am to GIP status with life expectancy of 2-5 days.  Dr. Debby Bud is attending.   Forrestine Him is SW.  Please call [facility] @ [phone]- ask for RN Liaison or after hours,ask for on-call RN with any hospice needs or at patient's death.  Thank you.  Joneen Boers, RN  [facility]  Hospice Liaison  [phone])

## 2013-08-10 NOTE — Progress Notes (Signed)
Patient pronounced dead at 0850 this am by two RN's. Patient without respirations or apical pulse. Patients daughter called at 70 and Dr. Debby Bud informed.Family is now at bedside. Setzer, Yvonna Alanis Long RN

## 2013-08-10 DEATH — deceased
